# Patient Record
Sex: Male | Born: 1945 | Race: Black or African American | Hispanic: No | Marital: Single | Smoking: Never smoker
Health system: Southern US, Community
[De-identification: ages and names within clinical notes are randomized; demographics above are authoritative.]

## PROBLEM LIST (undated history)

## (undated) DIAGNOSIS — I10 Essential (primary) hypertension: Secondary | ICD-10-CM

## (undated) DIAGNOSIS — N289 Disorder of kidney and ureter, unspecified: Secondary | ICD-10-CM

## (undated) HISTORY — PX: NEPHRECTOMY TRANSPLANTED ORGAN: SUR880

---

## 1998-02-16 ENCOUNTER — Encounter: Payer: Self-pay | Admitting: Emergency Medicine

## 1998-02-16 ENCOUNTER — Emergency Department (HOSPITAL_COMMUNITY): Admission: EM | Admit: 1998-02-16 | Discharge: 1998-02-16 | Payer: Self-pay | Admitting: Emergency Medicine

## 1998-03-21 ENCOUNTER — Ambulatory Visit (HOSPITAL_COMMUNITY): Admission: RE | Admit: 1998-03-21 | Discharge: 1998-03-21 | Payer: Self-pay | Admitting: Family Medicine

## 1998-03-21 ENCOUNTER — Encounter: Payer: Self-pay | Admitting: Family Medicine

## 1998-03-22 ENCOUNTER — Encounter: Payer: Self-pay | Admitting: Nephrology

## 1998-03-22 ENCOUNTER — Inpatient Hospital Stay (HOSPITAL_COMMUNITY): Admission: AD | Admit: 1998-03-22 | Discharge: 1998-03-26 | Payer: Self-pay | Admitting: Nephrology

## 1998-03-23 ENCOUNTER — Encounter: Payer: Self-pay | Admitting: Nephrology

## 1998-03-25 ENCOUNTER — Encounter: Payer: Self-pay | Admitting: Nephrology

## 1998-04-01 ENCOUNTER — Ambulatory Visit (HOSPITAL_COMMUNITY): Admission: RE | Admit: 1998-04-01 | Discharge: 1998-04-01 | Payer: Self-pay | Admitting: Vascular Surgery

## 1998-06-23 ENCOUNTER — Ambulatory Visit (HOSPITAL_COMMUNITY): Admission: RE | Admit: 1998-06-23 | Discharge: 1998-06-23 | Payer: Self-pay | Admitting: Nephrology

## 1998-08-30 ENCOUNTER — Ambulatory Visit (HOSPITAL_COMMUNITY): Admission: RE | Admit: 1998-08-30 | Discharge: 1998-08-30 | Payer: Self-pay | Admitting: Gastroenterology

## 1999-04-10 ENCOUNTER — Ambulatory Visit (HOSPITAL_COMMUNITY): Admission: RE | Admit: 1999-04-10 | Discharge: 1999-04-10 | Payer: Self-pay | Admitting: Nephrology

## 1999-04-10 ENCOUNTER — Encounter: Payer: Self-pay | Admitting: Nephrology

## 2001-02-28 ENCOUNTER — Encounter (HOSPITAL_COMMUNITY): Admission: RE | Admit: 2001-02-28 | Discharge: 2001-05-29 | Payer: Self-pay | Admitting: Nephrology

## 2001-03-19 ENCOUNTER — Encounter: Payer: Self-pay | Admitting: Nephrology

## 2001-03-19 ENCOUNTER — Encounter: Admission: RE | Admit: 2001-03-19 | Discharge: 2001-03-19 | Payer: Self-pay | Admitting: Nephrology

## 2001-05-30 ENCOUNTER — Encounter (HOSPITAL_COMMUNITY): Admission: RE | Admit: 2001-05-30 | Discharge: 2001-08-28 | Payer: Self-pay | Admitting: Nephrology

## 2001-09-04 ENCOUNTER — Encounter (HOSPITAL_COMMUNITY): Admission: RE | Admit: 2001-09-04 | Discharge: 2001-12-03 | Payer: Self-pay | Admitting: Nephrology

## 2002-05-22 ENCOUNTER — Encounter (HOSPITAL_COMMUNITY): Admission: RE | Admit: 2002-05-22 | Discharge: 2002-08-20 | Payer: Self-pay | Admitting: Nephrology

## 2002-08-28 ENCOUNTER — Encounter (HOSPITAL_COMMUNITY): Admission: RE | Admit: 2002-08-28 | Discharge: 2002-11-26 | Payer: Self-pay | Admitting: Nephrology

## 2002-12-04 ENCOUNTER — Encounter (HOSPITAL_COMMUNITY): Admission: RE | Admit: 2002-12-04 | Discharge: 2003-03-04 | Payer: Self-pay | Admitting: Nephrology

## 2003-03-12 ENCOUNTER — Encounter (HOSPITAL_COMMUNITY): Admission: RE | Admit: 2003-03-12 | Discharge: 2003-06-10 | Payer: Self-pay | Admitting: Nephrology

## 2003-06-21 ENCOUNTER — Encounter (HOSPITAL_COMMUNITY): Admission: RE | Admit: 2003-06-21 | Discharge: 2003-09-19 | Payer: Self-pay | Admitting: Nephrology

## 2003-09-24 ENCOUNTER — Encounter (HOSPITAL_COMMUNITY): Admission: RE | Admit: 2003-09-24 | Discharge: 2003-12-23 | Payer: Self-pay | Admitting: Nephrology

## 2003-12-31 ENCOUNTER — Encounter (HOSPITAL_COMMUNITY): Admission: RE | Admit: 2003-12-31 | Discharge: 2004-03-30 | Payer: Self-pay | Admitting: Nephrology

## 2004-04-07 ENCOUNTER — Encounter (HOSPITAL_COMMUNITY): Admission: RE | Admit: 2004-04-07 | Discharge: 2004-07-06 | Payer: Self-pay | Admitting: Nephrology

## 2004-07-07 ENCOUNTER — Encounter (HOSPITAL_COMMUNITY): Admission: RE | Admit: 2004-07-07 | Discharge: 2004-10-05 | Payer: Self-pay | Admitting: Nephrology

## 2004-10-13 ENCOUNTER — Encounter (HOSPITAL_COMMUNITY): Admission: RE | Admit: 2004-10-13 | Discharge: 2005-01-11 | Payer: Self-pay | Admitting: Nephrology

## 2005-01-19 ENCOUNTER — Encounter (HOSPITAL_COMMUNITY): Admission: RE | Admit: 2005-01-19 | Discharge: 2005-04-19 | Payer: Self-pay | Admitting: Nephrology

## 2005-04-20 ENCOUNTER — Encounter (HOSPITAL_COMMUNITY): Admission: RE | Admit: 2005-04-20 | Discharge: 2005-07-19 | Payer: Self-pay | Admitting: Nephrology

## 2005-07-20 ENCOUNTER — Encounter (HOSPITAL_COMMUNITY): Admission: RE | Admit: 2005-07-20 | Discharge: 2005-10-18 | Payer: Self-pay | Admitting: Nephrology

## 2005-11-02 ENCOUNTER — Encounter (HOSPITAL_COMMUNITY): Admission: RE | Admit: 2005-11-02 | Discharge: 2005-11-02 | Payer: Self-pay | Admitting: Nephrology

## 2005-11-23 ENCOUNTER — Encounter (HOSPITAL_COMMUNITY): Admission: RE | Admit: 2005-11-23 | Discharge: 2006-02-21 | Payer: Self-pay | Admitting: Nephrology

## 2006-03-08 ENCOUNTER — Encounter (HOSPITAL_COMMUNITY): Admission: RE | Admit: 2006-03-08 | Discharge: 2006-06-06 | Payer: Self-pay | Admitting: Nephrology

## 2006-03-25 ENCOUNTER — Ambulatory Visit: Payer: Self-pay | Admitting: Family Medicine

## 2006-06-21 ENCOUNTER — Encounter (HOSPITAL_COMMUNITY): Admission: RE | Admit: 2006-06-21 | Discharge: 2006-09-19 | Payer: Self-pay | Admitting: Nephrology

## 2006-10-04 ENCOUNTER — Encounter (HOSPITAL_COMMUNITY): Admission: RE | Admit: 2006-10-04 | Discharge: 2007-01-02 | Payer: Self-pay | Admitting: Nephrology

## 2007-01-10 ENCOUNTER — Encounter (HOSPITAL_COMMUNITY): Admission: RE | Admit: 2007-01-10 | Discharge: 2007-04-10 | Payer: Self-pay | Admitting: Nephrology

## 2007-05-02 ENCOUNTER — Encounter (HOSPITAL_COMMUNITY): Admission: RE | Admit: 2007-05-02 | Discharge: 2007-07-31 | Payer: Self-pay | Admitting: Nephrology

## 2007-08-01 ENCOUNTER — Encounter (HOSPITAL_COMMUNITY): Admission: RE | Admit: 2007-08-01 | Discharge: 2007-10-30 | Payer: Self-pay | Admitting: Nephrology

## 2007-11-14 ENCOUNTER — Encounter (HOSPITAL_COMMUNITY): Admission: RE | Admit: 2007-11-14 | Discharge: 2008-02-12 | Payer: Self-pay | Admitting: Nephrology

## 2008-02-13 ENCOUNTER — Encounter (HOSPITAL_COMMUNITY): Admission: RE | Admit: 2008-02-13 | Discharge: 2008-03-04 | Payer: Self-pay | Admitting: Nephrology

## 2008-03-11 ENCOUNTER — Encounter (HOSPITAL_COMMUNITY): Admission: RE | Admit: 2008-03-11 | Discharge: 2008-06-09 | Payer: Self-pay | Admitting: Nephrology

## 2008-06-11 ENCOUNTER — Encounter (HOSPITAL_COMMUNITY): Admission: RE | Admit: 2008-06-11 | Discharge: 2008-09-09 | Payer: Self-pay | Admitting: Nephrology

## 2008-09-17 ENCOUNTER — Encounter (HOSPITAL_COMMUNITY): Admission: RE | Admit: 2008-09-17 | Discharge: 2008-12-16 | Payer: Self-pay | Admitting: Nephrology

## 2008-12-24 ENCOUNTER — Encounter (HOSPITAL_COMMUNITY): Admission: RE | Admit: 2008-12-24 | Discharge: 2009-03-25 | Payer: Self-pay | Admitting: Nephrology

## 2009-04-15 ENCOUNTER — Encounter (HOSPITAL_COMMUNITY): Admission: RE | Admit: 2009-04-15 | Discharge: 2009-07-14 | Payer: Self-pay | Admitting: Nephrology

## 2009-07-28 ENCOUNTER — Encounter (HOSPITAL_COMMUNITY): Admission: RE | Admit: 2009-07-28 | Discharge: 2009-10-26 | Payer: Self-pay | Admitting: Nephrology

## 2009-11-11 ENCOUNTER — Encounter (HOSPITAL_COMMUNITY)
Admission: RE | Admit: 2009-11-11 | Discharge: 2010-02-09 | Payer: Self-pay | Source: Home / Self Care | Attending: Nephrology | Admitting: Nephrology

## 2010-02-10 ENCOUNTER — Encounter (HOSPITAL_COMMUNITY)
Admission: RE | Admit: 2010-02-10 | Discharge: 2010-04-04 | Payer: Self-pay | Source: Home / Self Care | Attending: Nephrology | Admitting: Nephrology

## 2010-03-20 LAB — POCT HEMOGLOBIN-HEMACUE: Hemoglobin: 11.2 g/dL — ABNORMAL LOW (ref 13.0–17.0)

## 2010-04-07 ENCOUNTER — Other Ambulatory Visit: Payer: Self-pay | Admitting: Nephrology

## 2010-04-07 ENCOUNTER — Encounter (HOSPITAL_COMMUNITY): Payer: BC Managed Care – PPO | Attending: Nephrology

## 2010-04-07 DIAGNOSIS — N183 Chronic kidney disease, stage 3 unspecified: Secondary | ICD-10-CM | POA: Insufficient documentation

## 2010-04-07 DIAGNOSIS — D638 Anemia in other chronic diseases classified elsewhere: Secondary | ICD-10-CM | POA: Insufficient documentation

## 2010-04-10 LAB — POCT HEMOGLOBIN-HEMACUE: Hemoglobin: 10.8 g/dL — ABNORMAL LOW (ref 13.0–17.0)

## 2010-04-28 ENCOUNTER — Encounter (HOSPITAL_COMMUNITY): Payer: Self-pay

## 2010-05-01 ENCOUNTER — Encounter (HOSPITAL_COMMUNITY): Payer: BC Managed Care – PPO

## 2010-05-01 ENCOUNTER — Other Ambulatory Visit: Payer: Self-pay

## 2010-05-02 LAB — POCT HEMOGLOBIN-HEMACUE: Hemoglobin: 10.4 g/dL — ABNORMAL LOW (ref 13.0–17.0)

## 2010-05-15 LAB — POCT HEMOGLOBIN-HEMACUE: Hemoglobin: 11.9 g/dL — ABNORMAL LOW (ref 13.0–17.0)

## 2010-05-16 LAB — TACROLIMUS LEVEL: Tacrolimus (FK506) - LabCorp: 6.5 ng/mL

## 2010-05-16 LAB — CBC
HCT: 30.7 % — ABNORMAL LOW (ref 39.0–52.0)
Hemoglobin: 10.1 g/dL — ABNORMAL LOW (ref 13.0–17.0)
MCH: 28.1 pg (ref 26.0–34.0)
MCHC: 32.9 g/dL (ref 30.0–36.0)
MCV: 85.5 fL (ref 78.0–100.0)
Platelets: 185 10*3/uL (ref 150–400)
RBC: 3.59 MIL/uL — ABNORMAL LOW (ref 4.22–5.81)
RDW: 13.8 % (ref 11.5–15.5)
WBC: 7.9 10*3/uL (ref 4.0–10.5)

## 2010-05-16 LAB — DIFFERENTIAL
Basophils Absolute: 0 10*3/uL (ref 0.0–0.1)
Basophils Relative: 0 % (ref 0–1)
Eosinophils Absolute: 0.1 10*3/uL (ref 0.0–0.7)
Eosinophils Relative: 1 % (ref 0–5)
Lymphocytes Relative: 18 % (ref 12–46)
Lymphs Abs: 1.4 10*3/uL (ref 0.7–4.0)
Monocytes Absolute: 0.2 10*3/uL (ref 0.1–1.0)
Monocytes Relative: 2 % — ABNORMAL LOW (ref 3–12)
Neutro Abs: 6.3 10*3/uL (ref 1.7–7.7)
Neutrophils Relative %: 79 % — ABNORMAL HIGH (ref 43–77)

## 2010-05-16 LAB — PTH, INTACT AND CALCIUM
Calcium, Total (PTH): 8.7 mg/dL (ref 8.4–10.5)
PTH: 110.1 pg/mL — ABNORMAL HIGH (ref 14.0–72.0)

## 2010-05-16 LAB — COMPREHENSIVE METABOLIC PANEL
ALT: 12 U/L (ref 0–53)
AST: 23 U/L (ref 0–37)
Albumin: 3.7 g/dL (ref 3.5–5.2)
Alkaline Phosphatase: 78 U/L (ref 39–117)
BUN: 22 mg/dL (ref 6–23)
CO2: 25 mEq/L (ref 19–32)
Calcium: 9.3 mg/dL (ref 8.4–10.5)
Chloride: 107 mEq/L (ref 96–112)
Creatinine, Ser: 2.46 mg/dL — ABNORMAL HIGH (ref 0.4–1.5)
GFR calc Af Amer: 32 mL/min — ABNORMAL LOW (ref >60)
GFR calc non Af Amer: 27 mL/min — ABNORMAL LOW (ref >60)
Glucose, Bld: 103 mg/dL — ABNORMAL HIGH (ref 70–99)
Potassium: 4.9 mEq/L (ref 3.5–5.1)
Sodium: 136 mEq/L (ref 135–145)
Total Bilirubin: 0.7 mg/dL (ref 0.3–1.2)
Total Protein: 8.2 g/dL (ref 6.0–8.3)

## 2010-05-16 LAB — LIPID PANEL
Cholesterol: 149 mg/dL (ref 0–200)
HDL: 14 mg/dL — ABNORMAL LOW (ref >39)
LDL Cholesterol: 107 mg/dL — ABNORMAL HIGH (ref 0–99)
Total CHOL/HDL Ratio: 10.6 RATIO
Triglycerides: 142 mg/dL (ref <150)
VLDL: 28 mg/dL (ref 0–40)

## 2010-05-17 LAB — IRON AND TIBC
Iron: 31 ug/dL — ABNORMAL LOW (ref 42–135)
Saturation Ratios: 11 % — ABNORMAL LOW (ref 20–55)
TIBC: 270 ug/dL (ref 215–435)
UIBC: 239 ug/dL

## 2010-05-17 LAB — FERRITIN: Ferritin: 335 ng/mL — ABNORMAL HIGH (ref 22–322)

## 2010-05-17 LAB — POCT HEMOGLOBIN-HEMACUE
Hemoglobin: 10.6 g/dL — ABNORMAL LOW (ref 13.0–17.0)
Hemoglobin: 9.8 g/dL — ABNORMAL LOW (ref 13.0–17.0)

## 2010-05-18 LAB — LIPID PANEL
Cholesterol: 166 mg/dL (ref 0–200)
HDL: 13 mg/dL — ABNORMAL LOW (ref >39)
LDL Cholesterol: 126 mg/dL — ABNORMAL HIGH (ref 0–99)
Total CHOL/HDL Ratio: 12.8 RATIO
Triglycerides: 133 mg/dL (ref <150)
VLDL: 27 mg/dL (ref 0–40)

## 2010-05-18 LAB — CBC
HCT: 32.4 % — ABNORMAL LOW (ref 39.0–52.0)
Hemoglobin: 10.9 g/dL — ABNORMAL LOW (ref 13.0–17.0)
MCH: 28.8 pg (ref 26.0–34.0)
MCHC: 33.6 g/dL (ref 30.0–36.0)
MCV: 85.5 fL (ref 78.0–100.0)
Platelets: 177 10*3/uL (ref 150–400)
RBC: 3.79 MIL/uL — ABNORMAL LOW (ref 4.22–5.81)
RDW: 13.2 % (ref 11.5–15.5)
WBC: 6.5 10*3/uL (ref 4.0–10.5)

## 2010-05-18 LAB — COMPREHENSIVE METABOLIC PANEL
ALT: 14 U/L (ref 0–53)
AST: 29 U/L (ref 0–37)
Albumin: 3.5 g/dL (ref 3.5–5.2)
Alkaline Phosphatase: 79 U/L (ref 39–117)
BUN: 24 mg/dL — ABNORMAL HIGH (ref 6–23)
CO2: 21 mEq/L (ref 19–32)
Calcium: 8.9 mg/dL (ref 8.4–10.5)
Chloride: 109 mEq/L (ref 96–112)
Creatinine, Ser: 2.37 mg/dL — ABNORMAL HIGH (ref 0.4–1.5)
GFR calc Af Amer: 34 mL/min — ABNORMAL LOW (ref >60)
GFR calc non Af Amer: 28 mL/min — ABNORMAL LOW (ref >60)
Glucose, Bld: 120 mg/dL — ABNORMAL HIGH (ref 70–99)
Potassium: 5.2 mEq/L — ABNORMAL HIGH (ref 3.5–5.1)
Sodium: 134 mEq/L — ABNORMAL LOW (ref 135–145)
Total Bilirubin: 0.5 mg/dL (ref 0.3–1.2)
Total Protein: 8 g/dL (ref 6.0–8.3)

## 2010-05-18 LAB — POCT HEMOGLOBIN-HEMACUE: Hemoglobin: 10.7 g/dL — ABNORMAL LOW (ref 13.0–17.0)

## 2010-05-18 LAB — DIFFERENTIAL
Basophils Absolute: 0 10*3/uL (ref 0.0–0.1)
Basophils Relative: 0 % (ref 0–1)
Eosinophils Absolute: 0.1 10*3/uL (ref 0.0–0.7)
Eosinophils Relative: 1 % (ref 0–5)
Lymphocytes Relative: 19 % (ref 12–46)
Lymphs Abs: 1.3 10*3/uL (ref 0.7–4.0)
Monocytes Absolute: 0.1 10*3/uL (ref 0.1–1.0)
Monocytes Relative: 2 % — ABNORMAL LOW (ref 3–12)
Neutro Abs: 5.1 10*3/uL (ref 1.7–7.7)
Neutrophils Relative %: 78 % — ABNORMAL HIGH (ref 43–77)

## 2010-05-18 LAB — PTH, INTACT AND CALCIUM
Calcium, Total (PTH): 8.9 mg/dL (ref 8.4–10.5)
PTH: 144.3 pg/mL — ABNORMAL HIGH (ref 14.0–72.0)

## 2010-05-18 LAB — TACROLIMUS LEVEL: Tacrolimus (FK506) - LabCorp: 3.3 ng/mL

## 2010-05-19 ENCOUNTER — Other Ambulatory Visit: Payer: Self-pay | Admitting: Nephrology

## 2010-05-19 ENCOUNTER — Other Ambulatory Visit: Payer: Self-pay

## 2010-05-19 ENCOUNTER — Encounter (HOSPITAL_COMMUNITY)
Admission: RE | Admit: 2010-05-19 | Discharge: 2010-05-19 | Disposition: A | Discharge status: Home / Self Care | Payer: BC Managed Care – PPO | Source: Ambulatory Visit | Attending: Nephrology | Admitting: Nephrology

## 2010-05-19 DIAGNOSIS — D638 Anemia in other chronic diseases classified elsewhere: Secondary | ICD-10-CM | POA: Insufficient documentation

## 2010-05-19 DIAGNOSIS — N183 Chronic kidney disease, stage 3 unspecified: Secondary | ICD-10-CM | POA: Insufficient documentation

## 2010-05-19 LAB — RENAL FUNCTION PANEL
Albumin: 4 g/dL (ref 3.5–5.2)
BUN: 23 mg/dL (ref 6–23)
Calcium: 9.2 mg/dL (ref 8.4–10.5)
Glucose, Bld: 104 mg/dL — ABNORMAL HIGH (ref 70–99)
Phosphorus: 2.4 mg/dL (ref 2.3–4.6)
Potassium: 4.5 mEq/L (ref 3.5–5.1)
Sodium: 137 mEq/L (ref 135–145)

## 2010-05-19 LAB — POCT HEMOGLOBIN-HEMACUE: Hemoglobin: 10 g/dL — ABNORMAL LOW (ref 13.0–17.0)

## 2010-05-19 LAB — FERRITIN: Ferritin: 641 ng/mL — ABNORMAL HIGH (ref 22–322)

## 2010-05-20 LAB — RENAL FUNCTION PANEL
BUN: 26 mg/dL — ABNORMAL HIGH (ref 6–23)
CO2: 22 mEq/L (ref 19–32)
Calcium: 8.5 mg/dL (ref 8.4–10.5)
Chloride: 109 mEq/L (ref 96–112)
Creatinine, Ser: 2.36 mg/dL — ABNORMAL HIGH (ref 0.4–1.5)
Glucose, Bld: 140 mg/dL — ABNORMAL HIGH (ref 70–99)

## 2010-05-20 LAB — IRON AND TIBC
Iron: 50 ug/dL (ref 42–135)
Saturation Ratios: 28 % (ref 20–55)
UIBC: 129 ug/dL

## 2010-05-20 LAB — FERRITIN: Ferritin: 298 ng/mL (ref 22–322)

## 2010-05-21 LAB — POCT HEMOGLOBIN-HEMACUE: Hemoglobin: 9.8 g/dL — ABNORMAL LOW (ref 13.0–17.0)

## 2010-05-22 LAB — PTH, INTACT AND CALCIUM
Calcium, Total (PTH): 9 mg/dL (ref 8.4–10.5)
PTH: 114 pg/mL — ABNORMAL HIGH (ref 14.0–72.0)
PTH: 152.4 pg/mL — ABNORMAL HIGH (ref 14.0–72.0)
PTH: 246.8 pg/mL — ABNORMAL HIGH (ref 14.0–72.0)

## 2010-05-22 LAB — RENAL FUNCTION PANEL
Albumin: 3.5 g/dL (ref 3.5–5.2)
CO2: 23 mEq/L (ref 19–32)
Chloride: 105 mEq/L (ref 96–112)
Chloride: 107 mEq/L (ref 96–112)
Glucose, Bld: 97 mg/dL (ref 70–99)
Glucose, Bld: 105 mg/dL — ABNORMAL HIGH (ref 70–99)
Phosphorus: 2.7 mg/dL (ref 2.3–4.6)
Phosphorus: 3.4 mg/dL (ref 2.3–4.6)
Potassium: 4.6 mEq/L (ref 3.5–5.1)
Potassium: 4.1 mEq/L (ref 3.5–5.1)
Sodium: 135 mEq/L (ref 135–145)
Sodium: 134 mEq/L — ABNORMAL LOW (ref 135–145)

## 2010-05-22 LAB — IRON AND TIBC
Iron: 85 ug/dL (ref 42–135)
TIBC: 212 ug/dL — ABNORMAL LOW (ref 215–435)
TIBC: 200 ug/dL — ABNORMAL LOW (ref 215–435)

## 2010-05-22 LAB — POCT HEMOGLOBIN-HEMACUE
Hemoglobin: 10.4 g/dL — ABNORMAL LOW (ref 13.0–17.0)
Hemoglobin: 10.7 g/dL — ABNORMAL LOW (ref 13.0–17.0)
Hemoglobin: 10.8 g/dL — ABNORMAL LOW (ref 13.0–17.0)
Hemoglobin: 10.9 g/dL — ABNORMAL LOW (ref 13.0–17.0)

## 2010-05-22 LAB — TACROLIMUS LEVEL: Tacrolimus (FK506) - LabCorp: 2.3 ng/mL

## 2010-05-23 LAB — PTH, INTACT AND CALCIUM
Calcium, Total (PTH): 9.3 mg/dL (ref 8.4–10.5)
PTH: 124.8 pg/mL — ABNORMAL HIGH (ref 14.0–72.0)

## 2010-05-23 LAB — LIPID PANEL
Cholesterol: 150 mg/dL (ref 0–200)
LDL Cholesterol: 78 mg/dL (ref 0–99)
Total CHOL/HDL Ratio: 4.5 RATIO
Triglycerides: 195 mg/dL — ABNORMAL HIGH (ref <150)

## 2010-05-23 LAB — RENAL FUNCTION PANEL
Albumin: 3.7 g/dL (ref 3.5–5.2)
BUN: 26 mg/dL — ABNORMAL HIGH (ref 6–23)
Creatinine, Ser: 2.51 mg/dL — ABNORMAL HIGH (ref 0.4–1.5)
GFR calc Af Amer: 32 mL/min — ABNORMAL LOW (ref >60)
GFR calc non Af Amer: 26 mL/min — ABNORMAL LOW (ref >60)
Phosphorus: 2.3 mg/dL (ref 2.3–4.6)
Potassium: 5 mEq/L (ref 3.5–5.1)

## 2010-05-23 LAB — IRON AND TIBC: Saturation Ratios: 34 % (ref 20–55)

## 2010-05-26 LAB — LIPID PANEL
Total CHOL/HDL Ratio: 4.4 RATIO
VLDL: 45 mg/dL — ABNORMAL HIGH (ref 0–40)

## 2010-05-26 LAB — POCT HEMOGLOBIN-HEMACUE: Hemoglobin: 9.8 g/dL — ABNORMAL LOW (ref 13.0–17.0)

## 2010-05-29 LAB — POCT HEMOGLOBIN-HEMACUE: Hemoglobin: 10.3 g/dL — ABNORMAL LOW (ref 13.0–17.0)

## 2010-06-05 LAB — RENAL FUNCTION PANEL
Albumin: 3.6 g/dL (ref 3.5–5.2)
GFR calc Af Amer: 34 mL/min — ABNORMAL LOW (ref >60)
GFR calc non Af Amer: 28 mL/min — ABNORMAL LOW (ref >60)
Phosphorus: 2 mg/dL — ABNORMAL LOW (ref 2.3–4.6)
Potassium: 4.9 mEq/L (ref 3.5–5.1)
Sodium: 133 mEq/L — ABNORMAL LOW (ref 135–145)

## 2010-06-05 LAB — POCT HEMOGLOBIN-HEMACUE
Hemoglobin: 11.9 g/dL — ABNORMAL LOW (ref 13.0–17.0)
Hemoglobin: 12.4 g/dL — ABNORMAL LOW (ref 13.0–17.0)

## 2010-06-05 LAB — PTH, INTACT AND CALCIUM
Calcium, Total (PTH): 9.1 mg/dL (ref 8.4–10.5)
PTH: 135.8 pg/mL — ABNORMAL HIGH (ref 14.0–72.0)

## 2010-06-07 LAB — RENAL FUNCTION PANEL
Calcium: 8.5 mg/dL (ref 8.4–10.5)
GFR calc Af Amer: 34 mL/min — ABNORMAL LOW (ref >60)
Glucose, Bld: 117 mg/dL — ABNORMAL HIGH (ref 70–99)
Phosphorus: 2.3 mg/dL (ref 2.3–4.6)
Sodium: 136 mEq/L (ref 135–145)

## 2010-06-07 LAB — IRON AND TIBC: TIBC: 192 ug/dL — ABNORMAL LOW (ref 215–435)

## 2010-06-07 LAB — FERRITIN: Ferritin: 283 ng/mL (ref 22–322)

## 2010-06-07 LAB — POCT HEMOGLOBIN-HEMACUE: Hemoglobin: 11.5 g/dL — ABNORMAL LOW (ref 13.0–17.0)

## 2010-06-07 LAB — PTH, INTACT AND CALCIUM: PTH: 165.7 pg/mL — ABNORMAL HIGH (ref 14.0–72.0)

## 2010-06-09 ENCOUNTER — Encounter (HOSPITAL_COMMUNITY): Payer: BC Managed Care – PPO | Attending: Nephrology

## 2010-06-09 ENCOUNTER — Other Ambulatory Visit: Payer: Self-pay | Admitting: Nephrology

## 2010-06-09 DIAGNOSIS — D638 Anemia in other chronic diseases classified elsewhere: Secondary | ICD-10-CM | POA: Insufficient documentation

## 2010-06-09 DIAGNOSIS — N183 Chronic kidney disease, stage 3 unspecified: Secondary | ICD-10-CM | POA: Insufficient documentation

## 2010-06-09 LAB — RENAL FUNCTION PANEL
BUN: 37 mg/dL — ABNORMAL HIGH (ref 6–23)
Calcium: 9.3 mg/dL (ref 8.4–10.5)
Calcium: 9.5 mg/dL (ref 8.4–10.5)
Creatinine, Ser: 2.46 mg/dL — ABNORMAL HIGH (ref 0.4–1.5)
Creatinine, Ser: 2.64 mg/dL — ABNORMAL HIGH (ref 0.4–1.5)
GFR calc Af Amer: 33 mL/min — ABNORMAL LOW (ref >60)
GFR calc non Af Amer: 27 mL/min — ABNORMAL LOW (ref >60)
Glucose, Bld: 43 mg/dL — ABNORMAL LOW (ref 70–99)
Phosphorus: 3.4 mg/dL (ref 2.3–4.6)
Phosphorus: 3.5 mg/dL (ref 2.3–4.6)
Sodium: 135 mEq/L (ref 135–145)
Sodium: 138 mEq/L (ref 135–145)

## 2010-06-09 LAB — IRON AND TIBC
Iron: 150 ug/dL — ABNORMAL HIGH (ref 42–135)
Saturation Ratios: 32 % (ref 20–55)
TIBC: 207 ug/dL — ABNORMAL LOW (ref 215–435)
UIBC: 140 ug/dL

## 2010-06-09 LAB — POCT HEMOGLOBIN-HEMACUE: Hemoglobin: 11.6 g/dL — ABNORMAL LOW (ref 13.0–17.0)

## 2010-06-09 LAB — FERRITIN: Ferritin: 358 ng/mL — ABNORMAL HIGH (ref 22–322)

## 2010-06-09 LAB — PTH, INTACT AND CALCIUM
Calcium, Total (PTH): 9.7 mg/dL (ref 8.4–10.5)
PTH: 118.3 pg/mL — ABNORMAL HIGH (ref 14.0–72.0)

## 2010-06-10 LAB — POCT HEMOGLOBIN-HEMACUE: Hemoglobin: 10.9 g/dL — ABNORMAL LOW (ref 13.0–17.0)

## 2010-06-12 LAB — RENAL FUNCTION PANEL
Albumin: 3.3 g/dL — ABNORMAL LOW (ref 3.5–5.2)
BUN: 25 mg/dL — ABNORMAL HIGH (ref 6–23)
CO2: 22 mEq/L (ref 19–32)
Calcium: 8.9 mg/dL (ref 8.4–10.5)
Creatinine, Ser: 2.21 mg/dL — ABNORMAL HIGH (ref 0.4–1.5)
GFR calc Af Amer: 37 mL/min — ABNORMAL LOW (ref >60)
GFR calc non Af Amer: 31 mL/min — ABNORMAL LOW (ref >60)

## 2010-06-12 LAB — IRON AND TIBC
Saturation Ratios: 43 % (ref 20–55)
UIBC: 102 ug/dL

## 2010-06-12 LAB — LIPID PANEL
HDL: <5 mg/dL — ABNORMAL LOW (ref >39)
Triglycerides: 176 mg/dL — ABNORMAL HIGH (ref <150)

## 2010-06-12 LAB — POCT HEMOGLOBIN-HEMACUE
Hemoglobin: 10.1 g/dL — ABNORMAL LOW (ref 13.0–17.0)
Hemoglobin: 11 g/dL — ABNORMAL LOW (ref 13.0–17.0)

## 2010-06-12 LAB — FERRITIN: Ferritin: 368 ng/mL — ABNORMAL HIGH (ref 22–322)

## 2010-06-12 LAB — PTH, INTACT AND CALCIUM: Calcium, Total (PTH): 9.7 mg/dL (ref 8.4–10.5)

## 2010-06-13 LAB — RENAL FUNCTION PANEL
Albumin: 3.4 g/dL — ABNORMAL LOW (ref 3.5–5.2)
BUN: 35 mg/dL — ABNORMAL HIGH (ref 6–23)
Chloride: 113 mEq/L — ABNORMAL HIGH (ref 96–112)
GFR calc non Af Amer: 27 mL/min — ABNORMAL LOW (ref >60)
Phosphorus: 2.7 mg/dL (ref 2.3–4.6)
Potassium: 5.6 mEq/L — ABNORMAL HIGH (ref 3.5–5.1)
Sodium: 136 mEq/L (ref 135–145)

## 2010-06-13 LAB — POCT HEMOGLOBIN-HEMACUE
Hemoglobin: 12.1 g/dL — ABNORMAL LOW (ref 13.0–17.0)
Hemoglobin: 10.1 g/dL — ABNORMAL LOW (ref 13.0–17.0)

## 2010-06-13 LAB — PTH, INTACT AND CALCIUM
Calcium, Total (PTH): 9.1 mg/dL (ref 8.4–10.5)
PTH: 65.4 pg/mL (ref 14.0–72.0)

## 2010-06-13 LAB — IRON AND TIBC: Saturation Ratios: 26 % (ref 20–55)

## 2010-06-14 LAB — POCT HEMOGLOBIN-HEMACUE: Hemoglobin: 10.9 g/dL — ABNORMAL LOW (ref 13.0–17.0)

## 2010-06-15 LAB — HEMOGLOBIN AND HEMATOCRIT, BLOOD
HCT: 35.2 % — ABNORMAL LOW (ref 39.0–52.0)
Hemoglobin: 12 g/dL — ABNORMAL LOW (ref 13.0–17.0)

## 2010-06-15 LAB — RENAL FUNCTION PANEL
Albumin: 3.3 g/dL — ABNORMAL LOW (ref 3.5–5.2)
BUN: 24 mg/dL — ABNORMAL HIGH (ref 6–23)
Chloride: 111 mEq/L (ref 96–112)
Phosphorus: 3.3 mg/dL (ref 2.3–4.6)
Potassium: 5 mEq/L (ref 3.5–5.1)
Sodium: 137 mEq/L (ref 135–145)

## 2010-06-15 LAB — POCT HEMOGLOBIN-HEMACUE: Hemoglobin: 11.2 g/dL — ABNORMAL LOW (ref 13.0–17.0)

## 2010-06-15 LAB — PTH, INTACT AND CALCIUM: Calcium, Total (PTH): 8.8 mg/dL (ref 8.4–10.5)

## 2010-06-15 LAB — IRON AND TIBC
Saturation Ratios: 32 % (ref 20–55)
TIBC: 188 ug/dL — ABNORMAL LOW (ref 215–435)

## 2010-06-19 LAB — HEMOGLOBIN AND HEMATOCRIT, BLOOD
HCT: 33.2 % — ABNORMAL LOW (ref 39.0–52.0)
Hemoglobin: 10.8 g/dL — ABNORMAL LOW (ref 13.0–17.0)

## 2010-06-19 LAB — RENAL FUNCTION PANEL
Albumin: 3.4 g/dL — ABNORMAL LOW (ref 3.5–5.2)
BUN: 24 mg/dL — ABNORMAL HIGH (ref 6–23)
Chloride: 110 mEq/L (ref 96–112)
GFR calc Af Amer: 30 mL/min — ABNORMAL LOW (ref >60)
GFR calc non Af Amer: 24 mL/min — ABNORMAL LOW (ref >60)
Phosphorus: 2.6 mg/dL (ref 2.3–4.6)
Potassium: 4.9 mEq/L (ref 3.5–5.1)
Sodium: 139 mEq/L (ref 135–145)

## 2010-06-19 LAB — IRON AND TIBC
Iron: 53 ug/dL (ref 42–135)
TIBC: 189 ug/dL — ABNORMAL LOW (ref 215–435)

## 2010-06-19 LAB — PTH, INTACT AND CALCIUM
Calcium, Total (PTH): 8.4 mg/dL (ref 8.4–10.5)
PTH: 132.2 pg/mL — ABNORMAL HIGH (ref 14.0–72.0)

## 2010-06-19 LAB — POCT HEMOGLOBIN-HEMACUE: Hemoglobin: 11.8 g/dL — ABNORMAL LOW (ref 13.0–17.0)

## 2010-06-19 LAB — FERRITIN: Ferritin: 344 ng/mL — ABNORMAL HIGH (ref 22–322)

## 2010-06-30 ENCOUNTER — Encounter (HOSPITAL_COMMUNITY): Payer: BC Managed Care – PPO

## 2010-07-03 ENCOUNTER — Encounter (HOSPITAL_COMMUNITY): Payer: BC Managed Care – PPO

## 2010-07-03 ENCOUNTER — Other Ambulatory Visit: Payer: Self-pay | Admitting: Nephrology

## 2010-07-21 ENCOUNTER — Other Ambulatory Visit: Payer: Self-pay | Admitting: Nephrology

## 2010-07-21 ENCOUNTER — Encounter (HOSPITAL_COMMUNITY): Payer: BC Managed Care – PPO | Attending: Nephrology

## 2010-07-21 DIAGNOSIS — N183 Chronic kidney disease, stage 3 unspecified: Secondary | ICD-10-CM | POA: Insufficient documentation

## 2010-07-21 DIAGNOSIS — D638 Anemia in other chronic diseases classified elsewhere: Secondary | ICD-10-CM | POA: Insufficient documentation

## 2010-07-21 LAB — RENAL FUNCTION PANEL
Albumin: 3.8 g/dL (ref 3.5–5.2)
BUN: 33 mg/dL — ABNORMAL HIGH (ref 6–23)
CO2: 20 mEq/L (ref 19–32)
Chloride: 109 mEq/L (ref 96–112)
Creatinine, Ser: 2.59 mg/dL — ABNORMAL HIGH (ref 0.4–1.5)
Glucose, Bld: 129 mg/dL — ABNORMAL HIGH (ref 70–99)
Potassium: 4.9 mEq/L (ref 3.5–5.1)

## 2010-07-21 LAB — IRON AND TIBC
Saturation Ratios: 41 % (ref 20–55)
TIBC: 209 ug/dL — ABNORMAL LOW (ref 215–435)
UIBC: 123 ug/dL

## 2010-07-24 LAB — POCT HEMOGLOBIN-HEMACUE: Hemoglobin: 10.5 g/dL — ABNORMAL LOW (ref 13.0–17.0)

## 2010-07-24 LAB — PTH, INTACT AND CALCIUM: PTH: 165.5 pg/mL — ABNORMAL HIGH (ref 14.0–72.0)

## 2010-08-11 ENCOUNTER — Encounter (HOSPITAL_COMMUNITY): Payer: BC Managed Care – PPO | Attending: Nephrology

## 2010-08-11 ENCOUNTER — Other Ambulatory Visit: Payer: Self-pay | Admitting: Nephrology

## 2010-08-11 DIAGNOSIS — N183 Chronic kidney disease, stage 3 unspecified: Secondary | ICD-10-CM | POA: Insufficient documentation

## 2010-08-11 DIAGNOSIS — D638 Anemia in other chronic diseases classified elsewhere: Secondary | ICD-10-CM | POA: Insufficient documentation

## 2010-09-01 ENCOUNTER — Encounter (HOSPITAL_COMMUNITY): Payer: BC Managed Care – PPO

## 2010-09-01 ENCOUNTER — Other Ambulatory Visit: Payer: Self-pay | Admitting: Nephrology

## 2010-09-01 LAB — POCT HEMOGLOBIN-HEMACUE: Hemoglobin: 8.7 g/dL — ABNORMAL LOW (ref 13.0–17.0)

## 2010-09-15 ENCOUNTER — Other Ambulatory Visit: Payer: Self-pay | Admitting: Nephrology

## 2010-09-15 ENCOUNTER — Encounter (HOSPITAL_COMMUNITY): Payer: BC Managed Care – PPO | Attending: Nephrology

## 2010-09-15 DIAGNOSIS — N183 Chronic kidney disease, stage 3 unspecified: Secondary | ICD-10-CM | POA: Insufficient documentation

## 2010-09-15 DIAGNOSIS — D638 Anemia in other chronic diseases classified elsewhere: Secondary | ICD-10-CM | POA: Insufficient documentation

## 2010-09-15 LAB — RENAL FUNCTION PANEL
Albumin: 3.2 g/dL — ABNORMAL LOW (ref 3.5–5.2)
BUN: 27 mg/dL — ABNORMAL HIGH (ref 6–23)
CO2: 22 mEq/L (ref 19–32)
Chloride: 108 mEq/L (ref 96–112)
Creatinine, Ser: 2.71 mg/dL — ABNORMAL HIGH (ref 0.50–1.35)
GFR calc non Af Amer: 24 mL/min — ABNORMAL LOW (ref >60)
Potassium: 5.3 mEq/L — ABNORMAL HIGH (ref 3.5–5.1)

## 2010-09-15 LAB — IRON AND TIBC
Iron: 48 ug/dL (ref 42–135)
Saturation Ratios: 26 % (ref 20–55)
TIBC: 185 ug/dL — ABNORMAL LOW (ref 215–435)

## 2010-09-15 LAB — POCT HEMOGLOBIN-HEMACUE: Hemoglobin: 10 g/dL — ABNORMAL LOW (ref 13.0–17.0)

## 2010-09-22 ENCOUNTER — Encounter (HOSPITAL_COMMUNITY): Payer: BC Managed Care – PPO

## 2010-09-29 ENCOUNTER — Other Ambulatory Visit: Payer: Self-pay | Admitting: Nephrology

## 2010-09-29 ENCOUNTER — Encounter (HOSPITAL_COMMUNITY): Payer: BC Managed Care – PPO

## 2010-10-13 ENCOUNTER — Encounter (HOSPITAL_COMMUNITY): Payer: BC Managed Care – PPO

## 2010-10-20 ENCOUNTER — Other Ambulatory Visit: Payer: Self-pay | Admitting: Nephrology

## 2010-10-20 ENCOUNTER — Encounter (HOSPITAL_COMMUNITY)
Admission: RE | Admit: 2010-10-20 | Discharge: 2010-10-20 | Disposition: A | Discharge status: Home / Self Care | Payer: BC Managed Care – PPO | Source: Ambulatory Visit | Attending: Nephrology | Admitting: Nephrology

## 2010-10-20 DIAGNOSIS — D638 Anemia in other chronic diseases classified elsewhere: Secondary | ICD-10-CM | POA: Insufficient documentation

## 2010-10-20 DIAGNOSIS — N183 Chronic kidney disease, stage 3 unspecified: Secondary | ICD-10-CM | POA: Insufficient documentation

## 2010-11-10 ENCOUNTER — Other Ambulatory Visit: Payer: Self-pay | Admitting: Nephrology

## 2010-11-10 ENCOUNTER — Encounter (HOSPITAL_COMMUNITY): Payer: BC Managed Care – PPO | Attending: Nephrology

## 2010-11-10 DIAGNOSIS — D638 Anemia in other chronic diseases classified elsewhere: Secondary | ICD-10-CM | POA: Insufficient documentation

## 2010-11-10 DIAGNOSIS — N183 Chronic kidney disease, stage 3 unspecified: Secondary | ICD-10-CM | POA: Insufficient documentation

## 2010-11-10 LAB — RENAL FUNCTION PANEL
BUN: 30 mg/dL — ABNORMAL HIGH (ref 6–23)
CO2: 21 mEq/L (ref 19–32)
Chloride: 109 mEq/L (ref 96–112)
Creatinine, Ser: 2.57 mg/dL — ABNORMAL HIGH (ref 0.50–1.35)
Glucose, Bld: 150 mg/dL — ABNORMAL HIGH (ref 70–99)
Potassium: 4.9 mEq/L (ref 3.5–5.1)

## 2010-11-10 LAB — FERRITIN: Ferritin: 636 ng/mL — ABNORMAL HIGH (ref 22–322)

## 2010-11-10 LAB — IRON AND TIBC
Iron: 39 ug/dL — ABNORMAL LOW (ref 42–135)
Saturation Ratios: 22 % (ref 20–55)
TIBC: 174 ug/dL — ABNORMAL LOW (ref 215–435)

## 2010-11-13 LAB — PTH, INTACT AND CALCIUM: PTH: 36.6 pg/mL (ref 14.0–72.0)

## 2010-11-24 LAB — LIPID PANEL
Cholesterol: 169
HDL: 22 — ABNORMAL LOW
Total CHOL/HDL Ratio: 7.7
VLDL: 31

## 2010-11-24 LAB — PTH, INTACT AND CALCIUM
Calcium, Total (PTH): 9.3
PTH: 96.3 — ABNORMAL HIGH

## 2010-11-24 LAB — IRON AND TIBC
Iron: 55
Saturation Ratios: 26
UIBC: 155

## 2010-11-24 LAB — FERRITIN: Ferritin: 307 (ref 22–322)

## 2010-11-24 LAB — TACROLIMUS, BLOOD: Tacrolimus Lvl: 7.9

## 2010-11-28 LAB — IRON AND TIBC
Iron: 60
UIBC: 139

## 2010-11-28 LAB — LIPID PANEL
Cholesterol: 148
LDL Cholesterol: 91

## 2010-11-28 LAB — RENAL FUNCTION PANEL
BUN: 33 — ABNORMAL HIGH
CO2: 22
Chloride: 108
Glucose, Bld: 89
Phosphorus: 2.5
Potassium: 4.9
Sodium: 137

## 2010-11-28 LAB — HEMOGLOBIN AND HEMATOCRIT, BLOOD
HCT: 28.2 — ABNORMAL LOW
Hemoglobin: 9.7 — ABNORMAL LOW

## 2010-11-28 LAB — TACROLIMUS, BLOOD: Tacrolimus Lvl: 5.2

## 2010-11-28 LAB — FERRITIN: Ferritin: 343 — ABNORMAL HIGH (ref 22–322)

## 2010-11-30 LAB — CBC
HCT: 32.2 — ABNORMAL LOW
MCV: 89.5
Platelets: 217
RBC: 3.6 — ABNORMAL LOW
WBC: 7

## 2010-11-30 LAB — IRON AND TIBC
Saturation Ratios: 22
UIBC: 158

## 2010-11-30 LAB — FERRITIN: Ferritin: 376 — ABNORMAL HIGH (ref 22–322)

## 2010-12-01 ENCOUNTER — Other Ambulatory Visit: Payer: Self-pay | Admitting: Nephrology

## 2010-12-01 ENCOUNTER — Encounter (HOSPITAL_COMMUNITY)
Admission: RE | Admit: 2010-12-01 | Discharge: 2010-12-01 | Payer: BC Managed Care – PPO | Source: Ambulatory Visit | Attending: Nephrology | Admitting: Nephrology

## 2010-12-01 LAB — HEMOGLOBIN AND HEMATOCRIT, BLOOD: HCT: 30.5 — ABNORMAL LOW

## 2010-12-01 LAB — POCT HEMOGLOBIN-HEMACUE: Hemoglobin: 10.7 g/dL — ABNORMAL LOW (ref 13.0–17.0)

## 2010-12-04 LAB — TACROLIMUS LEVEL: Tacrolimus (FK506) - LabCorp: 3.9 ng/mL

## 2010-12-05 LAB — RENAL FUNCTION PANEL
BUN: 24 — ABNORMAL HIGH
Chloride: 107
Creatinine, Ser: 2.36 — ABNORMAL HIGH
Glucose, Bld: 90
Potassium: 3.9

## 2010-12-05 LAB — IRON AND TIBC
Iron: 126
Saturation Ratios: 63 — ABNORMAL HIGH
UIBC: 73

## 2010-12-05 LAB — POCT HEMOGLOBIN-HEMACUE: Hemoglobin: 9.4 — ABNORMAL LOW

## 2010-12-05 LAB — MISCELLANEOUS TEST

## 2010-12-05 LAB — PTH, INTACT AND CALCIUM: PTH: 104.4 — ABNORMAL HIGH

## 2010-12-05 LAB — FERRITIN: Ferritin: 407 — ABNORMAL HIGH (ref 22–322)

## 2010-12-06 LAB — POCT HEMOGLOBIN-HEMACUE: Hemoglobin: 11 — ABNORMAL LOW

## 2010-12-08 LAB — PTH, INTACT AND CALCIUM: PTH: 48.4

## 2010-12-08 LAB — RENAL FUNCTION PANEL
BUN: 37 — ABNORMAL HIGH
CO2: 21
Chloride: 106
Glucose, Bld: 104 — ABNORMAL HIGH
Phosphorus: 3.9
Potassium: 4.7

## 2010-12-08 LAB — LIPID PANEL
HDL: 18 — ABNORMAL LOW
LDL Cholesterol: 92 mg/dL (ref 0–99)
LDL Cholesterol: 111 — ABNORMAL HIGH
Triglycerides: 137 mg/dL (ref <150)
Triglycerides: 112

## 2010-12-08 LAB — IRON AND TIBC: Iron: 88

## 2010-12-08 LAB — TACROLIMUS LEVEL: Tacrolimus (FK506) - LabCorp: 3.5 ng/mL

## 2010-12-08 LAB — FERRITIN: Ferritin: 363 — ABNORMAL HIGH (ref 22–322)

## 2010-12-08 LAB — CBC
MCHC: 34.4
MCV: 86.6
RBC: 3.8 — ABNORMAL LOW

## 2010-12-08 LAB — POCT HEMOGLOBIN-HEMACUE: Hemoglobin: 9.6 g/dL — ABNORMAL LOW (ref 13.0–17.0)

## 2010-12-12 LAB — CBC
HCT: 34.5 — ABNORMAL LOW
Hemoglobin: 11.8 — ABNORMAL LOW
MCV: 88.1
RBC: 3.92 — ABNORMAL LOW
WBC: 5.8

## 2010-12-13 LAB — RENAL FUNCTION PANEL
BUN: 24 — ABNORMAL HIGH
CO2: 24
Calcium: 9
Glucose, Bld: 93
Phosphorus: 2.8
Potassium: 4

## 2010-12-13 LAB — PTH, INTACT AND CALCIUM: PTH: 77.6 — ABNORMAL HIGH

## 2010-12-13 LAB — CBC
HCT: 33.8 — ABNORMAL LOW
MCV: 88
Platelets: 200
RDW: 14.1 — ABNORMAL HIGH

## 2010-12-13 LAB — LIPID PANEL
Cholesterol: 149
LDL Cholesterol: 61
Total CHOL/HDL Ratio: 6.5
Triglycerides: 326 — ABNORMAL HIGH
VLDL: 65 — ABNORMAL HIGH

## 2010-12-14 LAB — CBC
Hemoglobin: 11.7 — ABNORMAL LOW
MCHC: 33.9
RBC: 3.95 — ABNORMAL LOW
WBC: 5.8

## 2010-12-15 LAB — LIPID PANEL
Cholesterol: 163
HDL: 15 — ABNORMAL LOW
LDL Cholesterol: 109 — ABNORMAL HIGH
Total CHOL/HDL Ratio: 10.9
Triglycerides: 196 — ABNORMAL HIGH

## 2010-12-15 LAB — RENAL FUNCTION PANEL
CO2: 23
Calcium: 9.2
Chloride: 107
GFR calc Af Amer: 25 — ABNORMAL LOW
GFR calc non Af Amer: 21 — ABNORMAL LOW
Potassium: 4.5
Sodium: 137

## 2010-12-15 LAB — IRON AND TIBC
Iron: 34 — ABNORMAL LOW
UIBC: 183

## 2010-12-15 LAB — CBC
HCT: 34.7 — ABNORMAL LOW
MCHC: 33.9
MCV: 88
RBC: 3.94 — ABNORMAL LOW
WBC: 7.2

## 2010-12-15 LAB — TACROLIMUS, BLOOD: Tacrolimus Lvl: 6.8

## 2010-12-18 LAB — IRON AND TIBC
Saturation Ratios: 38
TIBC: 254

## 2010-12-18 LAB — CBC
Hemoglobin: 11.2 — ABNORMAL LOW
RBC: 3.84 — ABNORMAL LOW
RDW: 14.2 — ABNORMAL HIGH

## 2010-12-19 LAB — CBC
MCV: 88
Platelets: 181
WBC: 6.4

## 2010-12-19 LAB — RENAL FUNCTION PANEL
CO2: 22
Calcium: 9
Chloride: 110
Glucose, Bld: 107 — ABNORMAL HIGH
Potassium: 4.2
Sodium: 136

## 2010-12-19 LAB — IRON AND TIBC
Iron: 97
UIBC: 112

## 2010-12-22 ENCOUNTER — Encounter (HOSPITAL_COMMUNITY): Payer: BC Managed Care – PPO | Attending: Nephrology

## 2010-12-22 ENCOUNTER — Other Ambulatory Visit: Payer: Self-pay | Admitting: Nephrology

## 2010-12-22 DIAGNOSIS — D638 Anemia in other chronic diseases classified elsewhere: Secondary | ICD-10-CM | POA: Insufficient documentation

## 2010-12-22 DIAGNOSIS — N183 Chronic kidney disease, stage 3 unspecified: Secondary | ICD-10-CM | POA: Insufficient documentation

## 2010-12-22 LAB — POCT HEMOGLOBIN-HEMACUE: Hemoglobin: 10.6 g/dL — ABNORMAL LOW (ref 13.0–17.0)

## 2011-01-12 ENCOUNTER — Encounter (HOSPITAL_COMMUNITY)
Admission: RE | Admit: 2011-01-12 | Discharge: 2011-01-12 | Disposition: A | Discharge status: Home / Self Care | Payer: BC Managed Care – PPO | Source: Ambulatory Visit | Attending: Nephrology | Admitting: Nephrology

## 2011-01-12 DIAGNOSIS — D638 Anemia in other chronic diseases classified elsewhere: Secondary | ICD-10-CM | POA: Insufficient documentation

## 2011-01-12 DIAGNOSIS — N183 Chronic kidney disease, stage 3 unspecified: Secondary | ICD-10-CM | POA: Insufficient documentation

## 2011-01-12 LAB — RENAL FUNCTION PANEL
CO2: 20 mEq/L (ref 19–32)
Chloride: 105 mEq/L (ref 96–112)
GFR calc Af Amer: 32 mL/min — ABNORMAL LOW (ref >90)
GFR calc non Af Amer: 28 mL/min — ABNORMAL LOW (ref >90)
Potassium: 4.3 mEq/L (ref 3.5–5.1)
Sodium: 135 mEq/L (ref 135–145)

## 2011-01-12 LAB — IRON AND TIBC
Iron: 47 ug/dL (ref 42–135)
Saturation Ratios: 24 % (ref 20–55)
TIBC: 195 ug/dL — ABNORMAL LOW (ref 215–435)

## 2011-01-12 MED ORDER — EPOETIN ALFA 10000 UNIT/ML IJ SOLN: 20000.0000 [IU] | INTRAMUSCULAR | Status: DC

## 2011-01-12 MED ORDER — EPOETIN ALFA 20000 UNIT/ML IJ SOLN
INTRAMUSCULAR | Status: AC
  Filled 2011-01-12: qty 1

## 2011-01-15 MED FILL — Epoetin Alfa Inj 20000 Unit/ML: INTRAMUSCULAR | Qty: 1 | Status: AC

## 2011-02-02 ENCOUNTER — Encounter (HOSPITAL_COMMUNITY)
Admission: RE | Admit: 2011-02-02 | Discharge: 2011-02-02 | Disposition: A | Discharge status: Home / Self Care | Payer: BC Managed Care – PPO | Source: Ambulatory Visit | Attending: Nephrology | Admitting: Nephrology

## 2011-02-02 MED ORDER — EPOETIN ALFA 10000 UNIT/ML IJ SOLN: 20000.0000 [IU] | INTRAMUSCULAR | Status: DC

## 2011-02-02 MED ORDER — EPOETIN ALFA 20000 UNIT/ML IJ SOLN
INTRAMUSCULAR | Status: AC
  Administered 2011-02-02: 20000 [IU] via SUBCUTANEOUS
  Filled 2011-02-02: qty 1

## 2011-02-05 ENCOUNTER — Encounter (HOSPITAL_COMMUNITY): Payer: BC Managed Care – PPO

## 2011-02-06 ENCOUNTER — Encounter (HOSPITAL_COMMUNITY): Payer: BC Managed Care – PPO

## 2011-02-07 ENCOUNTER — Encounter (HOSPITAL_COMMUNITY): Payer: BC Managed Care – PPO

## 2011-02-08 ENCOUNTER — Encounter (HOSPITAL_COMMUNITY): Payer: BC Managed Care – PPO

## 2011-02-09 ENCOUNTER — Encounter (HOSPITAL_COMMUNITY): Payer: BC Managed Care – PPO

## 2011-02-12 ENCOUNTER — Encounter (HOSPITAL_COMMUNITY): Payer: BC Managed Care – PPO

## 2011-02-13 ENCOUNTER — Encounter (HOSPITAL_COMMUNITY): Payer: BC Managed Care – PPO

## 2011-02-14 ENCOUNTER — Encounter (HOSPITAL_COMMUNITY): Payer: BC Managed Care – PPO

## 2011-02-15 ENCOUNTER — Encounter (HOSPITAL_COMMUNITY): Payer: BC Managed Care – PPO

## 2011-02-16 ENCOUNTER — Encounter (HOSPITAL_COMMUNITY): Payer: BC Managed Care – PPO

## 2011-02-19 ENCOUNTER — Encounter (HOSPITAL_COMMUNITY): Payer: BC Managed Care – PPO

## 2011-02-20 ENCOUNTER — Encounter (HOSPITAL_COMMUNITY): Payer: BC Managed Care – PPO

## 2011-02-21 ENCOUNTER — Encounter (HOSPITAL_COMMUNITY): Payer: BC Managed Care – PPO

## 2011-02-22 ENCOUNTER — Encounter (HOSPITAL_COMMUNITY): Payer: BC Managed Care – PPO

## 2011-02-23 ENCOUNTER — Encounter (HOSPITAL_COMMUNITY): Payer: BC Managed Care – PPO

## 2011-02-23 ENCOUNTER — Encounter (HOSPITAL_COMMUNITY)
Admission: RE | Admit: 2011-02-23 | Discharge: 2011-02-23 | Disposition: A | Discharge status: Home / Self Care | Payer: BC Managed Care – PPO | Source: Ambulatory Visit | Attending: Nephrology | Admitting: Nephrology

## 2011-02-23 DIAGNOSIS — N183 Chronic kidney disease, stage 3 unspecified: Secondary | ICD-10-CM | POA: Insufficient documentation

## 2011-02-23 DIAGNOSIS — D638 Anemia in other chronic diseases classified elsewhere: Secondary | ICD-10-CM | POA: Insufficient documentation

## 2011-02-23 MED ORDER — EPOETIN ALFA 10000 UNIT/ML IJ SOLN: 20000.0000 [IU] | INTRAMUSCULAR | Status: DC

## 2011-02-23 MED ORDER — EPOETIN ALFA 20000 UNIT/ML IJ SOLN
INTRAMUSCULAR | Status: AC
  Administered 2011-02-23: 20000 [IU] via SUBCUTANEOUS
  Filled 2011-02-23: qty 1

## 2011-02-26 ENCOUNTER — Encounter (HOSPITAL_COMMUNITY): Payer: BC Managed Care – PPO

## 2011-02-28 ENCOUNTER — Encounter (HOSPITAL_COMMUNITY): Payer: BC Managed Care – PPO

## 2011-03-01 ENCOUNTER — Encounter (HOSPITAL_COMMUNITY): Payer: BC Managed Care – PPO

## 2011-03-02 ENCOUNTER — Encounter (HOSPITAL_COMMUNITY): Payer: BC Managed Care – PPO

## 2011-03-05 ENCOUNTER — Encounter (HOSPITAL_COMMUNITY): Payer: BC Managed Care – PPO

## 2011-03-07 ENCOUNTER — Encounter (HOSPITAL_COMMUNITY): Payer: BC Managed Care – PPO

## 2011-03-08 ENCOUNTER — Encounter (HOSPITAL_COMMUNITY): Payer: BC Managed Care – PPO

## 2011-03-09 ENCOUNTER — Encounter (HOSPITAL_COMMUNITY): Payer: BC Managed Care – PPO

## 2011-03-12 ENCOUNTER — Encounter (HOSPITAL_COMMUNITY): Payer: BC Managed Care – PPO

## 2011-03-13 ENCOUNTER — Encounter (HOSPITAL_COMMUNITY): Payer: BC Managed Care – PPO

## 2011-03-14 ENCOUNTER — Encounter (HOSPITAL_COMMUNITY): Payer: BC Managed Care – PPO

## 2011-03-15 ENCOUNTER — Encounter (HOSPITAL_COMMUNITY): Payer: BC Managed Care – PPO

## 2011-03-16 ENCOUNTER — Encounter (HOSPITAL_COMMUNITY): Payer: BC Managed Care – PPO

## 2011-03-16 ENCOUNTER — Encounter (HOSPITAL_COMMUNITY)
Admission: RE | Admit: 2011-03-16 | Discharge: 2011-03-16 | Disposition: A | Discharge status: Home / Self Care | Payer: BC Managed Care – PPO | Source: Ambulatory Visit | Attending: Nephrology | Admitting: Nephrology

## 2011-03-16 DIAGNOSIS — D638 Anemia in other chronic diseases classified elsewhere: Secondary | ICD-10-CM | POA: Insufficient documentation

## 2011-03-16 DIAGNOSIS — N183 Chronic kidney disease, stage 3 unspecified: Secondary | ICD-10-CM | POA: Insufficient documentation

## 2011-03-16 LAB — RENAL FUNCTION PANEL
Albumin: 3.5 g/dL (ref 3.5–5.2)
GFR calc Af Amer: 32 mL/min — ABNORMAL LOW (ref >90)
GFR calc non Af Amer: 28 mL/min — ABNORMAL LOW (ref >90)
Glucose, Bld: 65 mg/dL — ABNORMAL LOW (ref 70–99)
Phosphorus: 2.2 mg/dL — ABNORMAL LOW (ref 2.3–4.6)
Potassium: 5.6 mEq/L — ABNORMAL HIGH (ref 3.5–5.1)
Sodium: 134 mEq/L — ABNORMAL LOW (ref 135–145)

## 2011-03-16 LAB — IRON AND TIBC: Iron: 43 ug/dL (ref 42–135)

## 2011-03-16 LAB — POCT HEMOGLOBIN-HEMACUE: Hemoglobin: 10.4 g/dL — ABNORMAL LOW (ref 13.0–17.0)

## 2011-03-16 MED ORDER — EPOETIN ALFA 20000 UNIT/ML IJ SOLN
INTRAMUSCULAR | Status: AC
  Administered 2011-03-16: 17:00:00 via SUBCUTANEOUS
  Filled 2011-03-16: qty 1

## 2011-03-16 MED ORDER — EPOETIN ALFA 10000 UNIT/ML IJ SOLN: 20000.0000 [IU] | INTRAMUSCULAR | Status: DC

## 2011-03-19 ENCOUNTER — Encounter (HOSPITAL_COMMUNITY): Payer: BC Managed Care – PPO

## 2011-03-20 ENCOUNTER — Encounter (HOSPITAL_COMMUNITY): Payer: BC Managed Care – PPO

## 2011-03-21 ENCOUNTER — Encounter (HOSPITAL_COMMUNITY): Payer: BC Managed Care – PPO

## 2011-03-22 ENCOUNTER — Encounter (HOSPITAL_COMMUNITY): Payer: BC Managed Care – PPO

## 2011-03-23 ENCOUNTER — Encounter (HOSPITAL_COMMUNITY): Payer: BC Managed Care – PPO

## 2011-03-26 ENCOUNTER — Encounter (HOSPITAL_COMMUNITY): Payer: BC Managed Care – PPO

## 2011-03-27 ENCOUNTER — Encounter (HOSPITAL_COMMUNITY): Payer: BC Managed Care – PPO

## 2011-03-28 ENCOUNTER — Encounter (HOSPITAL_COMMUNITY): Payer: BC Managed Care – PPO

## 2011-03-29 ENCOUNTER — Encounter (HOSPITAL_COMMUNITY): Payer: BC Managed Care – PPO

## 2011-03-30 ENCOUNTER — Encounter (HOSPITAL_COMMUNITY): Payer: BC Managed Care – PPO

## 2011-04-02 ENCOUNTER — Encounter (HOSPITAL_COMMUNITY): Payer: BC Managed Care – PPO

## 2011-04-03 ENCOUNTER — Encounter (HOSPITAL_COMMUNITY): Payer: BC Managed Care – PPO

## 2011-04-04 ENCOUNTER — Encounter (HOSPITAL_COMMUNITY): Payer: BC Managed Care – PPO

## 2011-04-05 ENCOUNTER — Encounter (HOSPITAL_COMMUNITY): Payer: BC Managed Care – PPO

## 2011-04-06 ENCOUNTER — Encounter (HOSPITAL_COMMUNITY)
Admission: RE | Admit: 2011-04-06 | Discharge: 2011-04-06 | Disposition: A | Discharge status: Home / Self Care | Payer: BC Managed Care – PPO | Source: Ambulatory Visit | Attending: Nephrology | Admitting: Nephrology

## 2011-04-06 DIAGNOSIS — D638 Anemia in other chronic diseases classified elsewhere: Secondary | ICD-10-CM | POA: Insufficient documentation

## 2011-04-06 DIAGNOSIS — N183 Chronic kidney disease, stage 3 unspecified: Secondary | ICD-10-CM | POA: Insufficient documentation

## 2011-04-06 LAB — POCT HEMOGLOBIN-HEMACUE: Hemoglobin: 10.8 g/dL — ABNORMAL LOW (ref 13.0–17.0)

## 2011-04-06 MED ORDER — EPOETIN ALFA 10000 UNIT/ML IJ SOLN
20000.0000 [IU] | INTRAMUSCULAR | Status: DC
  Administered 2011-04-06: 20000 [IU] via SUBCUTANEOUS

## 2011-04-06 MED ORDER — EPOETIN ALFA 20000 UNIT/ML IJ SOLN
INTRAMUSCULAR | Status: AC
  Filled 2011-04-06: qty 1

## 2011-04-25 ENCOUNTER — Other Ambulatory Visit (HOSPITAL_COMMUNITY): Payer: Self-pay | Admitting: *Deleted

## 2011-04-27 ENCOUNTER — Encounter (HOSPITAL_COMMUNITY)
Admission: RE | Admit: 2011-04-27 | Discharge: 2011-04-27 | Disposition: A | Discharge status: Home / Self Care | Payer: BC Managed Care – PPO | Source: Ambulatory Visit | Attending: Nephrology | Admitting: Nephrology

## 2011-04-27 ENCOUNTER — Encounter (HOSPITAL_COMMUNITY): Payer: BC Managed Care – PPO

## 2011-04-27 LAB — POCT HEMOGLOBIN-HEMACUE: Hemoglobin: 10 g/dL — ABNORMAL LOW (ref 13.0–17.0)

## 2011-04-27 MED ORDER — FERUMOXYTOL INJECTION 510 MG/17 ML
INTRAVENOUS | Status: AC
  Administered 2011-04-27: 510 mg via INTRAVENOUS
  Filled 2011-04-27: qty 17

## 2011-04-27 MED ORDER — FERUMOXYTOL INJECTION 510 MG/17 ML
510.0000 mg | INTRAVENOUS | Status: DC
  Administered 2011-04-27: 510 mg via INTRAVENOUS

## 2011-04-27 MED ORDER — EPOETIN ALFA 20000 UNIT/ML IJ SOLN
INTRAMUSCULAR | Status: AC
  Administered 2011-04-27: 20000 [IU] via SUBCUTANEOUS
  Filled 2011-04-27: qty 1

## 2011-04-27 MED ORDER — EPOETIN ALFA 10000 UNIT/ML IJ SOLN: 20000.0000 [IU] | INTRAMUSCULAR | Status: DC

## 2011-05-18 ENCOUNTER — Encounter (HOSPITAL_COMMUNITY)
Admission: RE | Admit: 2011-05-18 | Discharge: 2011-05-18 | Disposition: A | Discharge status: Home / Self Care | Payer: BC Managed Care – PPO | Source: Ambulatory Visit | Attending: Nephrology | Admitting: Nephrology

## 2011-05-18 DIAGNOSIS — D638 Anemia in other chronic diseases classified elsewhere: Secondary | ICD-10-CM | POA: Insufficient documentation

## 2011-05-18 DIAGNOSIS — N183 Chronic kidney disease, stage 3 unspecified: Secondary | ICD-10-CM | POA: Insufficient documentation

## 2011-05-18 MED ORDER — FERUMOXYTOL INJECTION 510 MG/17 ML
INTRAVENOUS | Status: AC
  Administered 2011-05-18: 510 mg via INTRAVENOUS
  Filled 2011-05-18: qty 17

## 2011-05-18 MED ORDER — SODIUM CHLORIDE 0.9 % IV SOLN
Freq: Once | INTRAVENOUS | Status: AC
  Administered 2011-05-18: 13:00:00 via INTRAVENOUS

## 2011-05-18 MED ORDER — FERUMOXYTOL INJECTION 510 MG/17 ML
510.0000 mg | INTRAVENOUS | Status: AC
  Administered 2011-05-18: 510 mg via INTRAVENOUS

## 2011-05-18 MED ORDER — EPOETIN ALFA 10000 UNIT/ML IJ SOLN
20000.0000 [IU] | INTRAMUSCULAR | Status: DC
Start: 2011-05-18 — End: 2011-05-19

## 2011-05-18 MED ORDER — EPOETIN ALFA 20000 UNIT/ML IJ SOLN
INTRAMUSCULAR | Status: AC
  Administered 2011-05-18: 20000 [IU] via SUBCUTANEOUS
  Filled 2011-05-18: qty 1

## 2011-06-05 ENCOUNTER — Other Ambulatory Visit (HOSPITAL_COMMUNITY): Payer: Self-pay | Admitting: *Deleted

## 2011-06-08 ENCOUNTER — Encounter (HOSPITAL_COMMUNITY)
Admission: RE | Admit: 2011-06-08 | Discharge: 2011-06-08 | Disposition: A | Discharge status: Home / Self Care | Payer: BC Managed Care – PPO | Source: Ambulatory Visit | Attending: Nephrology | Admitting: Nephrology

## 2011-06-08 DIAGNOSIS — N183 Chronic kidney disease, stage 3 unspecified: Secondary | ICD-10-CM | POA: Insufficient documentation

## 2011-06-08 DIAGNOSIS — D638 Anemia in other chronic diseases classified elsewhere: Secondary | ICD-10-CM | POA: Insufficient documentation

## 2011-06-08 LAB — RENAL FUNCTION PANEL
BUN: 25 mg/dL — ABNORMAL HIGH (ref 6–23)
CO2: 23 mEq/L (ref 19–32)
Calcium: 9.2 mg/dL (ref 8.4–10.5)
Chloride: 108 mEq/L (ref 96–112)
Creatinine, Ser: 2.26 mg/dL — ABNORMAL HIGH (ref 0.50–1.35)

## 2011-06-08 LAB — IRON AND TIBC
Saturation Ratios: 82 % — ABNORMAL HIGH (ref 20–55)
TIBC: 176 ug/dL — ABNORMAL LOW (ref 215–435)
UIBC: 32 ug/dL — ABNORMAL LOW (ref 125–400)

## 2011-06-08 LAB — FERRITIN: Ferritin: 1116 ng/mL — ABNORMAL HIGH (ref 22–322)

## 2011-06-08 MED ORDER — EPOETIN ALFA 10000 UNIT/ML IJ SOLN: 20000.0000 [IU] | INTRAMUSCULAR | Status: DC

## 2011-06-08 MED ORDER — EPOETIN ALFA 20000 UNIT/ML IJ SOLN
INTRAMUSCULAR | Status: AC
  Administered 2011-06-08: 20000 [IU]
  Filled 2011-06-08: qty 1

## 2011-06-11 LAB — PTH, INTACT AND CALCIUM: PTH: 84 pg/mL — ABNORMAL HIGH (ref 14.0–72.0)

## 2011-06-11 LAB — POCT HEMOGLOBIN-HEMACUE: Hemoglobin: 10.9 g/dL — ABNORMAL LOW (ref 13.0–17.0)

## 2011-06-29 ENCOUNTER — Encounter (HOSPITAL_COMMUNITY)
Admission: RE | Admit: 2011-06-29 | Discharge: 2011-06-29 | Disposition: A | Discharge status: Home / Self Care | Payer: BC Managed Care – PPO | Source: Ambulatory Visit | Attending: Nephrology | Admitting: Nephrology

## 2011-06-29 LAB — POCT HEMOGLOBIN-HEMACUE: Hemoglobin: 10.2 g/dL — ABNORMAL LOW (ref 13.0–17.0)

## 2011-06-29 MED ORDER — EPOETIN ALFA 10000 UNIT/ML IJ SOLN: 20000.0000 [IU] | INTRAMUSCULAR | Status: DC

## 2011-06-29 MED ORDER — EPOETIN ALFA 20000 UNIT/ML IJ SOLN
INTRAMUSCULAR | Status: AC
  Administered 2011-06-29: 20000 [IU] via SUBCUTANEOUS
  Filled 2011-06-29: qty 1

## 2011-07-16 ENCOUNTER — Other Ambulatory Visit (HOSPITAL_COMMUNITY): Payer: Self-pay | Admitting: *Deleted

## 2011-07-20 ENCOUNTER — Encounter (HOSPITAL_COMMUNITY)
Admission: RE | Admit: 2011-07-20 | Discharge: 2011-07-20 | Disposition: A | Discharge status: Home / Self Care | Payer: BC Managed Care – PPO | Source: Ambulatory Visit | Attending: Nephrology | Admitting: Nephrology

## 2011-07-20 DIAGNOSIS — N183 Chronic kidney disease, stage 3 unspecified: Secondary | ICD-10-CM | POA: Insufficient documentation

## 2011-07-20 DIAGNOSIS — D638 Anemia in other chronic diseases classified elsewhere: Secondary | ICD-10-CM | POA: Insufficient documentation

## 2011-07-20 LAB — POCT HEMOGLOBIN-HEMACUE: Hemoglobin: 11.3 g/dL — ABNORMAL LOW (ref 13.0–17.0)

## 2011-07-20 MED ORDER — EPOETIN ALFA 20000 UNIT/ML IJ SOLN
INTRAMUSCULAR | Status: AC
  Administered 2011-07-20: 20000 [IU] via SUBCUTANEOUS
  Filled 2011-07-20: qty 1

## 2011-07-20 MED ORDER — EPOETIN ALFA 10000 UNIT/ML IJ SOLN: 20000.0000 [IU] | INTRAMUSCULAR | Status: DC

## 2011-08-09 ENCOUNTER — Other Ambulatory Visit (HOSPITAL_COMMUNITY): Payer: Self-pay | Admitting: *Deleted

## 2011-08-10 ENCOUNTER — Encounter (HOSPITAL_COMMUNITY)
Admission: RE | Admit: 2011-08-10 | Discharge: 2011-08-10 | Disposition: A | Discharge status: Home / Self Care | Payer: BC Managed Care – PPO | Source: Ambulatory Visit | Attending: Nephrology | Admitting: Nephrology

## 2011-08-10 DIAGNOSIS — N183 Chronic kidney disease, stage 3 unspecified: Secondary | ICD-10-CM | POA: Insufficient documentation

## 2011-08-10 DIAGNOSIS — D638 Anemia in other chronic diseases classified elsewhere: Secondary | ICD-10-CM | POA: Insufficient documentation

## 2011-08-10 LAB — URINALYSIS, MICROSCOPIC ONLY
Glucose, UA: NEGATIVE mg/dL
Ketones, ur: NEGATIVE mg/dL
Nitrite: NEGATIVE
Specific Gravity, Urine: 1.014 (ref 1.005–1.030)
pH: 5.5 (ref 5.0–8.0)

## 2011-08-10 LAB — CBC
Hemoglobin: 11.8 g/dL — ABNORMAL LOW (ref 13.0–17.0)
MCH: 29 pg (ref 26.0–34.0)
MCHC: 34.2 g/dL (ref 30.0–36.0)
Platelets: 191 10*3/uL (ref 150–400)
RDW: 13 % (ref 11.5–15.5)

## 2011-08-10 LAB — DIFFERENTIAL
Basophils Absolute: 0 10*3/uL (ref 0.0–0.1)
Eosinophils Absolute: 0.3 10*3/uL (ref 0.0–0.7)
Eosinophils Relative: 4 % (ref 0–5)
Lymphs Abs: 2.5 10*3/uL (ref 0.7–4.0)
Neutrophils Relative %: 54 % (ref 43–77)

## 2011-08-10 LAB — COMPREHENSIVE METABOLIC PANEL
ALT: 8 U/L (ref 0–53)
AST: 15 U/L (ref 0–37)
Albumin: 3.6 g/dL (ref 3.5–5.2)
Alkaline Phosphatase: 70 U/L (ref 39–117)
Calcium: 9.6 mg/dL (ref 8.4–10.5)
Potassium: 4.5 mEq/L (ref 3.5–5.1)
Sodium: 138 mEq/L (ref 135–145)
Total Protein: 8.6 g/dL — ABNORMAL HIGH (ref 6.0–8.3)

## 2011-08-10 LAB — LIPID PANEL
LDL Cholesterol: 52 mg/dL (ref 0–99)
Triglycerides: 200 mg/dL — ABNORMAL HIGH (ref <150)
VLDL: 40 mg/dL (ref 0–40)

## 2011-08-10 MED ORDER — EPOETIN ALFA 10000 UNIT/ML IJ SOLN: 20000.0000 [IU] | INTRAMUSCULAR | Status: DC

## 2011-08-11 LAB — TACROLIMUS LEVEL: Tacrolimus (FK506) - LabCorp: 6.7 ng/mL

## 2011-08-24 ENCOUNTER — Encounter (HOSPITAL_COMMUNITY)
Admission: RE | Admit: 2011-08-24 | Discharge: 2011-08-24 | Disposition: A | Discharge status: Home / Self Care | Payer: BC Managed Care – PPO | Source: Ambulatory Visit | Attending: Nephrology | Admitting: Nephrology

## 2011-08-24 LAB — POCT HEMOGLOBIN-HEMACUE: Hemoglobin: 9.8 g/dL — ABNORMAL LOW (ref 13.0–17.0)

## 2011-08-24 MED ORDER — EPOETIN ALFA 20000 UNIT/ML IJ SOLN
INTRAMUSCULAR | Status: AC
  Administered 2011-08-24: 20000 [IU] via SUBCUTANEOUS
  Filled 2011-08-24: qty 1

## 2011-08-24 MED ORDER — EPOETIN ALFA 10000 UNIT/ML IJ SOLN: 20000.0000 [IU] | INTRAMUSCULAR | Status: DC

## 2011-09-14 ENCOUNTER — Encounter (HOSPITAL_COMMUNITY)
Admission: RE | Admit: 2011-09-14 | Discharge: 2011-09-14 | Disposition: A | Discharge status: Home / Self Care | Payer: BC Managed Care – PPO | Source: Ambulatory Visit | Attending: Nephrology | Admitting: Nephrology

## 2011-09-14 DIAGNOSIS — N183 Chronic kidney disease, stage 3 unspecified: Secondary | ICD-10-CM | POA: Insufficient documentation

## 2011-09-14 DIAGNOSIS — D638 Anemia in other chronic diseases classified elsewhere: Secondary | ICD-10-CM | POA: Insufficient documentation

## 2011-09-14 LAB — IRON AND TIBC: TIBC: 180 ug/dL — ABNORMAL LOW (ref 215–435)

## 2011-09-14 MED ORDER — EPOETIN ALFA 20000 UNIT/ML IJ SOLN
INTRAMUSCULAR | Status: AC
  Administered 2011-09-14: 20000 [IU] via SUBCUTANEOUS
  Filled 2011-09-14: qty 1

## 2011-09-14 MED ORDER — EPOETIN ALFA 10000 UNIT/ML IJ SOLN: 20000.0000 [IU] | INTRAMUSCULAR | Status: DC

## 2011-09-17 LAB — PTH, INTACT AND CALCIUM: PTH: 104.5 pg/mL — ABNORMAL HIGH (ref 14.0–72.0)

## 2011-10-05 ENCOUNTER — Encounter (HOSPITAL_COMMUNITY)
Admission: RE | Admit: 2011-10-05 | Discharge: 2011-10-05 | Disposition: A | Discharge status: Home / Self Care | Payer: BC Managed Care – PPO | Source: Ambulatory Visit | Attending: Nephrology | Admitting: Nephrology

## 2011-10-05 DIAGNOSIS — N183 Chronic kidney disease, stage 3 unspecified: Secondary | ICD-10-CM | POA: Insufficient documentation

## 2011-10-05 DIAGNOSIS — D638 Anemia in other chronic diseases classified elsewhere: Secondary | ICD-10-CM | POA: Insufficient documentation

## 2011-10-05 MED ORDER — EPOETIN ALFA 20000 UNIT/ML IJ SOLN
INTRAMUSCULAR | Status: AC
  Administered 2011-10-05: 20000 [IU] via SUBCUTANEOUS
  Filled 2011-10-05: qty 1

## 2011-10-05 MED ORDER — EPOETIN ALFA 10000 UNIT/ML IJ SOLN: 20000.0000 [IU] | INTRAMUSCULAR | Status: DC

## 2011-10-25 ENCOUNTER — Other Ambulatory Visit (HOSPITAL_COMMUNITY): Payer: Self-pay | Admitting: *Deleted

## 2011-10-26 ENCOUNTER — Encounter (HOSPITAL_COMMUNITY)
Admission: RE | Admit: 2011-10-26 | Discharge: 2011-10-26 | Disposition: A | Discharge status: Home / Self Care | Payer: BC Managed Care – PPO | Source: Ambulatory Visit | Attending: Nephrology | Admitting: Nephrology

## 2011-10-26 MED ORDER — EPOETIN ALFA 20000 UNIT/ML IJ SOLN
INTRAMUSCULAR | Status: AC
  Administered 2011-10-26: 20000 [IU] via SUBCUTANEOUS
  Filled 2011-10-26: qty 1

## 2011-10-26 MED ORDER — EPOETIN ALFA 10000 UNIT/ML IJ SOLN: 20000.0000 [IU] | INTRAMUSCULAR | Status: DC

## 2011-11-16 ENCOUNTER — Encounter (HOSPITAL_COMMUNITY)
Admission: RE | Admit: 2011-11-16 | Discharge: 2011-11-16 | Disposition: A | Discharge status: Home / Self Care | Payer: BC Managed Care – PPO | Source: Ambulatory Visit | Attending: Nephrology | Admitting: Nephrology

## 2011-11-16 DIAGNOSIS — N183 Chronic kidney disease, stage 3 unspecified: Secondary | ICD-10-CM | POA: Insufficient documentation

## 2011-11-16 DIAGNOSIS — D638 Anemia in other chronic diseases classified elsewhere: Secondary | ICD-10-CM | POA: Insufficient documentation

## 2011-11-16 LAB — MAGNESIUM: Magnesium: 1.5 mg/dL (ref 1.5–2.5)

## 2011-11-16 LAB — COMPREHENSIVE METABOLIC PANEL
Albumin: 3.5 g/dL (ref 3.5–5.2)
BUN: 33 mg/dL — ABNORMAL HIGH (ref 6–23)
Creatinine, Ser: 2.8 mg/dL — ABNORMAL HIGH (ref 0.50–1.35)
GFR calc Af Amer: 26 mL/min — ABNORMAL LOW (ref >90)
Glucose, Bld: 100 mg/dL — ABNORMAL HIGH (ref 70–99)
Total Protein: 8 g/dL (ref 6.0–8.3)

## 2011-11-16 LAB — PHOSPHORUS: Phosphorus: 2.6 mg/dL (ref 2.3–4.6)

## 2011-11-16 LAB — POCT HEMOGLOBIN-HEMACUE: Hemoglobin: 11 g/dL — ABNORMAL LOW (ref 13.0–17.0)

## 2011-11-16 LAB — DIFFERENTIAL
Basophils Relative: 0 % (ref 0–1)
Eosinophils Absolute: 0.1 10*3/uL (ref 0.0–0.7)
Eosinophils Relative: 2 % (ref 0–5)
Monocytes Absolute: 0.4 10*3/uL (ref 0.1–1.0)
Monocytes Relative: 7 % (ref 3–12)

## 2011-11-16 LAB — LIPID PANEL
Cholesterol: 134 mg/dL (ref 0–200)
LDL Cholesterol: 48 mg/dL (ref 0–99)
Triglycerides: 172 mg/dL — ABNORMAL HIGH (ref <150)

## 2011-11-16 MED ORDER — EPOETIN ALFA 20000 UNIT/ML IJ SOLN
INTRAMUSCULAR | Status: AC
  Administered 2011-11-16: 20000 [IU]
  Filled 2011-11-16: qty 1

## 2011-11-16 MED ORDER — EPOETIN ALFA 10000 UNIT/ML IJ SOLN: 20000.0000 [IU] | INTRAMUSCULAR | Status: DC

## 2011-11-16 NOTE — Progress Notes (Signed)
Francisco Williamson could not void today and we could not collect his urinalysis.  He also did not have time towait for the result of his cbc he had to be at the bank by 5

## 2011-11-19 LAB — PTH, INTACT AND CALCIUM: Calcium, Total (PTH): 9.2 mg/dL (ref 8.4–10.5)

## 2011-12-07 ENCOUNTER — Encounter (HOSPITAL_COMMUNITY): Payer: BC Managed Care – PPO

## 2011-12-10 ENCOUNTER — Encounter (HOSPITAL_COMMUNITY)
Admission: RE | Admit: 2011-12-10 | Discharge: 2011-12-10 | Disposition: A | Discharge status: Home / Self Care | Payer: BC Managed Care – PPO | Source: Ambulatory Visit | Attending: Nephrology | Admitting: Nephrology

## 2011-12-10 DIAGNOSIS — D638 Anemia in other chronic diseases classified elsewhere: Secondary | ICD-10-CM | POA: Insufficient documentation

## 2011-12-10 DIAGNOSIS — N183 Chronic kidney disease, stage 3 unspecified: Secondary | ICD-10-CM | POA: Insufficient documentation

## 2011-12-10 LAB — URINALYSIS, MICROSCOPIC ONLY
Leukocytes, UA: NEGATIVE
Nitrite: NEGATIVE
Specific Gravity, Urine: 1.017 (ref 1.005–1.030)
Urobilinogen, UA: 1 mg/dL (ref 0.0–1.0)

## 2011-12-10 LAB — POCT HEMOGLOBIN-HEMACUE: Hemoglobin: 10.1 g/dL — ABNORMAL LOW (ref 13.0–17.0)

## 2011-12-10 LAB — FERRITIN: Ferritin: 747 ng/mL — ABNORMAL HIGH (ref 22–322)

## 2011-12-10 LAB — IRON AND TIBC: UIBC: 108 ug/dL — ABNORMAL LOW (ref 125–400)

## 2011-12-10 MED ORDER — EPOETIN ALFA 20000 UNIT/ML IJ SOLN
INTRAMUSCULAR | Status: AC
  Administered 2011-12-10: 20000 [IU] via SUBCUTANEOUS
  Filled 2011-12-10: qty 1

## 2012-01-04 ENCOUNTER — Encounter (HOSPITAL_COMMUNITY)
Admission: RE | Admit: 2012-01-04 | Discharge: 2012-01-04 | Disposition: A | Discharge status: Home / Self Care | Payer: BC Managed Care – PPO | Source: Ambulatory Visit | Attending: Nephrology | Admitting: Nephrology

## 2012-01-04 DIAGNOSIS — N183 Chronic kidney disease, stage 3 unspecified: Secondary | ICD-10-CM | POA: Insufficient documentation

## 2012-01-04 DIAGNOSIS — D638 Anemia in other chronic diseases classified elsewhere: Secondary | ICD-10-CM | POA: Insufficient documentation

## 2012-01-04 MED ORDER — EPOETIN ALFA 10000 UNIT/ML IJ SOLN: 20000.0000 [IU] | INTRAMUSCULAR | Status: DC

## 2012-01-04 MED ORDER — EPOETIN ALFA 20000 UNIT/ML IJ SOLN
INTRAMUSCULAR | Status: AC
  Administered 2012-01-04: 20000 [IU] via SUBCUTANEOUS
  Filled 2012-01-04: qty 1

## 2012-01-24 ENCOUNTER — Other Ambulatory Visit (HOSPITAL_COMMUNITY): Payer: Self-pay | Admitting: *Deleted

## 2012-01-24 ENCOUNTER — Encounter (HOSPITAL_COMMUNITY): Payer: BC Managed Care – PPO

## 2012-01-25 ENCOUNTER — Encounter (HOSPITAL_COMMUNITY): Payer: BC Managed Care – PPO

## 2012-01-28 ENCOUNTER — Encounter (HOSPITAL_COMMUNITY)
Admission: RE | Admit: 2012-01-28 | Discharge: 2012-01-28 | Disposition: A | Discharge status: Home / Self Care | Payer: BC Managed Care – PPO | Source: Ambulatory Visit | Attending: Nephrology | Admitting: Nephrology

## 2012-01-28 MED ORDER — EPOETIN ALFA 20000 UNIT/ML IJ SOLN
INTRAMUSCULAR | Status: AC
  Administered 2012-01-28: 20000 [IU] via SUBCUTANEOUS
  Filled 2012-01-28: qty 1

## 2012-01-28 MED ORDER — EPOETIN ALFA 10000 UNIT/ML IJ SOLN: 20000.0000 [IU] | INTRAMUSCULAR | Status: DC

## 2012-02-22 ENCOUNTER — Encounter (HOSPITAL_COMMUNITY)
Admission: RE | Admit: 2012-02-22 | Discharge: 2012-02-22 | Disposition: A | Discharge status: Home / Self Care | Payer: BC Managed Care – PPO | Source: Ambulatory Visit | Attending: Nephrology | Admitting: Nephrology

## 2012-02-22 DIAGNOSIS — D638 Anemia in other chronic diseases classified elsewhere: Secondary | ICD-10-CM | POA: Insufficient documentation

## 2012-02-22 DIAGNOSIS — N183 Chronic kidney disease, stage 3 unspecified: Secondary | ICD-10-CM | POA: Insufficient documentation

## 2012-02-22 LAB — DIFFERENTIAL
Basophils Relative: 0 % (ref 0–1)
Lymphocytes Relative: 10 % — ABNORMAL LOW (ref 12–46)
Lymphs Abs: 1 10*3/uL (ref 0.7–4.0)
Monocytes Relative: 2 % — ABNORMAL LOW (ref 3–12)
Neutro Abs: 8.5 10*3/uL — ABNORMAL HIGH (ref 1.7–7.7)
Neutrophils Relative %: 87 % — ABNORMAL HIGH (ref 43–77)

## 2012-02-22 LAB — COMPREHENSIVE METABOLIC PANEL
Albumin: 3.6 g/dL (ref 3.5–5.2)
Alkaline Phosphatase: 71 U/L (ref 39–117)
BUN: 28 mg/dL — ABNORMAL HIGH (ref 6–23)
CO2: 22 mEq/L (ref 19–32)
Chloride: 103 mEq/L (ref 96–112)
GFR calc non Af Amer: 25 mL/min — ABNORMAL LOW (ref >90)
Potassium: 5.1 mEq/L (ref 3.5–5.1)
Total Bilirubin: 0.3 mg/dL (ref 0.3–1.2)

## 2012-02-22 LAB — CBC
HCT: 30.9 % — ABNORMAL LOW (ref 39.0–52.0)
Hemoglobin: 10.4 g/dL — ABNORMAL LOW (ref 13.0–17.0)
RBC: 3.57 MIL/uL — ABNORMAL LOW (ref 4.22–5.81)
WBC: 9.8 10*3/uL (ref 4.0–10.5)

## 2012-02-22 LAB — PHOSPHORUS: Phosphorus: 1.9 mg/dL — ABNORMAL LOW (ref 2.3–4.6)

## 2012-02-22 MED ORDER — EPOETIN ALFA 20000 UNIT/ML IJ SOLN
INTRAMUSCULAR | Status: AC
  Administered 2012-02-22: 20000 [IU] via SUBCUTANEOUS
  Filled 2012-02-22: qty 1

## 2012-02-22 MED ORDER — EPOETIN ALFA 10000 UNIT/ML IJ SOLN: 20000.0000 [IU] | INTRAMUSCULAR | Status: DC

## 2012-02-25 LAB — PTH, INTACT AND CALCIUM
Calcium, Total (PTH): 9.1 mg/dL (ref 8.4–10.5)
PTH: 148.7 pg/mL — ABNORMAL HIGH (ref 14.0–72.0)

## 2012-03-14 ENCOUNTER — Encounter (HOSPITAL_COMMUNITY)
Admission: RE | Admit: 2012-03-14 | Discharge: 2012-03-14 | Disposition: A | Discharge status: Home / Self Care | Payer: BC Managed Care – PPO | Source: Ambulatory Visit | Attending: Nephrology | Admitting: Nephrology

## 2012-03-14 DIAGNOSIS — N183 Chronic kidney disease, stage 3 unspecified: Secondary | ICD-10-CM | POA: Insufficient documentation

## 2012-03-14 DIAGNOSIS — D638 Anemia in other chronic diseases classified elsewhere: Secondary | ICD-10-CM | POA: Insufficient documentation

## 2012-03-14 LAB — URINALYSIS, MICROSCOPIC ONLY
Bilirubin Urine: NEGATIVE
Glucose, UA: NEGATIVE mg/dL
Hgb urine dipstick: NEGATIVE
Ketones, ur: NEGATIVE mg/dL
Nitrite: NEGATIVE
Protein, ur: NEGATIVE mg/dL
Specific Gravity, Urine: 1.019 (ref 1.005–1.030)
Urobilinogen, UA: 0.2 mg/dL (ref 0.0–1.0)
pH: 5 (ref 5.0–8.0)

## 2012-03-14 LAB — LIPID PANEL
Cholesterol: 162 mg/dL (ref 0–200)
HDL: 50 mg/dL
LDL Cholesterol: 51 mg/dL (ref 0–99)
Total CHOL/HDL Ratio: 3.2 ratio
Triglycerides: 306 mg/dL — ABNORMAL HIGH
VLDL: 61 mg/dL — ABNORMAL HIGH (ref 0–40)

## 2012-03-14 LAB — FERRITIN: Ferritin: 705 ng/mL — ABNORMAL HIGH (ref 22–322)

## 2012-03-14 LAB — IRON AND TIBC
Saturation Ratios: 33 % (ref 20–55)
TIBC: 187 ug/dL — ABNORMAL LOW (ref 215–435)

## 2012-03-14 LAB — POCT HEMOGLOBIN-HEMACUE: Hemoglobin: 9.9 g/dL — ABNORMAL LOW (ref 13.0–17.0)

## 2012-03-14 MED ORDER — EPOETIN ALFA 10000 UNIT/ML IJ SOLN: 20000.0000 [IU] | INTRAMUSCULAR | Status: DC

## 2012-03-14 MED ORDER — EPOETIN ALFA 20000 UNIT/ML IJ SOLN
INTRAMUSCULAR | Status: AC
  Administered 2012-03-14: 20000 [IU] via SUBCUTANEOUS
  Filled 2012-03-14: qty 1

## 2012-04-04 ENCOUNTER — Encounter (HOSPITAL_COMMUNITY)
Admission: RE | Admit: 2012-04-04 | Discharge: 2012-04-04 | Disposition: A | Discharge status: Home / Self Care | Payer: BC Managed Care – PPO | Source: Ambulatory Visit | Attending: Nephrology | Admitting: Nephrology

## 2012-04-04 LAB — POCT HEMOGLOBIN-HEMACUE: Hemoglobin: 10 g/dL — ABNORMAL LOW (ref 13.0–17.0)

## 2012-04-04 MED ORDER — EPOETIN ALFA 10000 UNIT/ML IJ SOLN: 20000.0000 [IU] | INTRAMUSCULAR | Status: DC

## 2012-04-04 MED ORDER — EPOETIN ALFA 20000 UNIT/ML IJ SOLN
INTRAMUSCULAR | Status: AC
  Administered 2012-04-04: 20000 [IU] via SUBCUTANEOUS
  Filled 2012-04-04: qty 1

## 2012-04-25 ENCOUNTER — Encounter (HOSPITAL_COMMUNITY)
Admission: RE | Admit: 2012-04-25 | Discharge: 2012-04-25 | Disposition: A | Discharge status: Home / Self Care | Payer: BC Managed Care – PPO | Source: Ambulatory Visit | Attending: Nephrology | Admitting: Nephrology

## 2012-04-25 DIAGNOSIS — N183 Chronic kidney disease, stage 3 unspecified: Secondary | ICD-10-CM | POA: Insufficient documentation

## 2012-04-25 DIAGNOSIS — D638 Anemia in other chronic diseases classified elsewhere: Secondary | ICD-10-CM | POA: Insufficient documentation

## 2012-04-25 MED ORDER — EPOETIN ALFA 10000 UNIT/ML IJ SOLN: 20000.0000 [IU] | INTRAMUSCULAR | Status: DC

## 2012-04-25 MED ORDER — EPOETIN ALFA 20000 UNIT/ML IJ SOLN
INTRAMUSCULAR | Status: AC
  Administered 2012-04-25: 20000 [IU] via SUBCUTANEOUS
  Filled 2012-04-25: qty 1

## 2012-05-16 ENCOUNTER — Encounter (HOSPITAL_COMMUNITY)
Admission: RE | Admit: 2012-05-16 | Discharge: 2012-05-16 | Disposition: A | Discharge status: Home / Self Care | Payer: BC Managed Care – PPO | Source: Ambulatory Visit | Attending: Nephrology | Admitting: Nephrology

## 2012-05-16 DIAGNOSIS — N183 Chronic kidney disease, stage 3 unspecified: Secondary | ICD-10-CM | POA: Insufficient documentation

## 2012-05-16 DIAGNOSIS — D638 Anemia in other chronic diseases classified elsewhere: Secondary | ICD-10-CM | POA: Insufficient documentation

## 2012-05-16 LAB — COMPREHENSIVE METABOLIC PANEL
AST: 23 U/L (ref 0–37)
BUN: 26 mg/dL — ABNORMAL HIGH (ref 6–23)
CO2: 22 mEq/L (ref 19–32)
Calcium: 9.5 mg/dL (ref 8.4–10.5)
Chloride: 106 mEq/L (ref 96–112)
Creatinine, Ser: 2.56 mg/dL — ABNORMAL HIGH (ref 0.50–1.35)
GFR calc non Af Amer: 25 mL/min — ABNORMAL LOW (ref >90)
Total Bilirubin: 0.4 mg/dL (ref 0.3–1.2)

## 2012-05-16 LAB — DIFFERENTIAL
Basophils Absolute: 0 10*3/uL (ref 0.0–0.1)
Basophils Relative: 0 % (ref 0–1)
Eosinophils Relative: 1 % (ref 0–5)
Lymphocytes Relative: 20 % (ref 12–46)
Monocytes Absolute: 0.2 10*3/uL (ref 0.1–1.0)
Monocytes Relative: 3 % (ref 3–12)
Neutro Abs: 5 10*3/uL (ref 1.7–7.7)

## 2012-05-16 LAB — CBC
HCT: 34.5 % — ABNORMAL LOW (ref 39.0–52.0)
Hemoglobin: 11.9 g/dL — ABNORMAL LOW (ref 13.0–17.0)
MCHC: 34.5 g/dL (ref 30.0–36.0)
MCV: 86 fL (ref 78.0–100.0)
RDW: 13.5 % (ref 11.5–15.5)

## 2012-05-16 LAB — PHOSPHORUS: Phosphorus: 2.9 mg/dL (ref 2.3–4.6)

## 2012-05-16 LAB — MAGNESIUM: Magnesium: 1.5 mg/dL (ref 1.5–2.5)

## 2012-05-16 MED ORDER — EPOETIN ALFA 10000 UNIT/ML IJ SOLN: 20000.0000 [IU] | INTRAMUSCULAR | Status: DC

## 2012-05-18 LAB — TACROLIMUS LEVEL: Tacrolimus (FK506) - LabCorp: 2.9 ng/mL

## 2012-05-30 ENCOUNTER — Encounter (HOSPITAL_COMMUNITY)
Admission: RE | Admit: 2012-05-30 | Discharge: 2012-05-30 | Disposition: A | Discharge status: Home / Self Care | Payer: BC Managed Care – PPO | Source: Ambulatory Visit | Attending: Nephrology | Admitting: Nephrology

## 2012-05-30 MED ORDER — EPOETIN ALFA 10000 UNIT/ML IJ SOLN: 20000.0000 [IU] | INTRAMUSCULAR | Status: DC

## 2012-05-30 MED ORDER — EPOETIN ALFA 20000 UNIT/ML IJ SOLN
INTRAMUSCULAR | Status: AC
  Administered 2012-05-30: 20000 [IU] via SUBCUTANEOUS
  Filled 2012-05-30: qty 1

## 2012-06-20 ENCOUNTER — Encounter (HOSPITAL_COMMUNITY)
Admission: RE | Admit: 2012-06-20 | Discharge: 2012-06-20 | Disposition: A | Discharge status: Home / Self Care | Payer: BC Managed Care – PPO | Source: Ambulatory Visit | Attending: Nephrology | Admitting: Nephrology

## 2012-06-20 DIAGNOSIS — D638 Anemia in other chronic diseases classified elsewhere: Secondary | ICD-10-CM | POA: Insufficient documentation

## 2012-06-20 DIAGNOSIS — N183 Chronic kidney disease, stage 3 unspecified: Secondary | ICD-10-CM | POA: Insufficient documentation

## 2012-06-20 LAB — URINALYSIS, MICROSCOPIC ONLY
Glucose, UA: NEGATIVE mg/dL
Hgb urine dipstick: NEGATIVE
Ketones, ur: NEGATIVE mg/dL
pH: 5.5 (ref 5.0–8.0)

## 2012-06-20 LAB — LIPID PANEL: HDL: 63 mg/dL (ref >39)

## 2012-06-20 MED ORDER — EPOETIN ALFA 10000 UNIT/ML IJ SOLN: 20000.0000 [IU] | INTRAMUSCULAR | Status: DC

## 2012-06-20 MED ORDER — EPOETIN ALFA 20000 UNIT/ML IJ SOLN
INTRAMUSCULAR | Status: AC
  Administered 2012-06-20: 20000 [IU] via SUBCUTANEOUS
  Filled 2012-06-20: qty 1

## 2012-06-21 LAB — URINE CULTURE
Colony Count: NO GROWTH
Culture: NO GROWTH

## 2012-07-11 ENCOUNTER — Encounter (HOSPITAL_COMMUNITY)
Admission: RE | Admit: 2012-07-11 | Discharge: 2012-07-11 | Disposition: A | Discharge status: Home / Self Care | Payer: BC Managed Care – PPO | Source: Ambulatory Visit | Attending: Nephrology | Admitting: Nephrology

## 2012-07-11 DIAGNOSIS — D638 Anemia in other chronic diseases classified elsewhere: Secondary | ICD-10-CM | POA: Insufficient documentation

## 2012-07-11 DIAGNOSIS — N183 Chronic kidney disease, stage 3 unspecified: Secondary | ICD-10-CM | POA: Insufficient documentation

## 2012-07-11 MED ORDER — EPOETIN ALFA 20000 UNIT/ML IJ SOLN
INTRAMUSCULAR | Status: AC
  Administered 2012-07-11: 20000 [IU]
  Filled 2012-07-11: qty 1

## 2012-07-11 MED ORDER — EPOETIN ALFA 10000 UNIT/ML IJ SOLN: 20000.0000 [IU] | INTRAMUSCULAR | Status: DC

## 2012-07-14 LAB — POCT HEMOGLOBIN-HEMACUE: Hemoglobin: 9.6 g/dL — ABNORMAL LOW (ref 13.0–17.0)

## 2012-07-31 ENCOUNTER — Other Ambulatory Visit (HOSPITAL_COMMUNITY): Payer: Self-pay | Admitting: *Deleted

## 2012-08-01 ENCOUNTER — Encounter (HOSPITAL_COMMUNITY)
Admission: RE | Admit: 2012-08-01 | Discharge: 2012-08-01 | Disposition: A | Discharge status: Home / Self Care | Payer: BC Managed Care – PPO | Source: Ambulatory Visit | Attending: Nephrology | Admitting: Nephrology

## 2012-08-01 MED ORDER — EPOETIN ALFA 20000 UNIT/ML IJ SOLN
INTRAMUSCULAR | Status: AC
  Administered 2012-08-01: 20000 [IU] via SUBCUTANEOUS
  Filled 2012-08-01: qty 1

## 2012-08-01 MED ORDER — EPOETIN ALFA 10000 UNIT/ML IJ SOLN: 20000.0000 [IU] | INTRAMUSCULAR | Status: DC

## 2012-08-21 ENCOUNTER — Other Ambulatory Visit (HOSPITAL_COMMUNITY): Payer: Self-pay

## 2012-08-22 ENCOUNTER — Encounter (HOSPITAL_COMMUNITY): Payer: BC Managed Care – PPO

## 2012-08-25 ENCOUNTER — Encounter (HOSPITAL_COMMUNITY)
Admission: RE | Admit: 2012-08-25 | Discharge: 2012-08-25 | Disposition: A | Discharge status: Home / Self Care | Payer: BC Managed Care – PPO | Source: Ambulatory Visit | Attending: Nephrology | Admitting: Nephrology

## 2012-08-25 DIAGNOSIS — N183 Chronic kidney disease, stage 3 unspecified: Secondary | ICD-10-CM | POA: Insufficient documentation

## 2012-08-25 DIAGNOSIS — D638 Anemia in other chronic diseases classified elsewhere: Secondary | ICD-10-CM | POA: Insufficient documentation

## 2012-08-25 LAB — COMPREHENSIVE METABOLIC PANEL
Albumin: 3.3 g/dL — ABNORMAL LOW (ref 3.5–5.2)
BUN: 29 mg/dL — ABNORMAL HIGH (ref 6–23)
Calcium: 8.7 mg/dL (ref 8.4–10.5)
Creatinine, Ser: 2.71 mg/dL — ABNORMAL HIGH (ref 0.50–1.35)
GFR calc Af Amer: 27 mL/min — ABNORMAL LOW (ref >90)
Glucose, Bld: 99 mg/dL (ref 70–99)
Potassium: 5.1 mEq/L (ref 3.5–5.1)
Total Protein: 8.4 g/dL — ABNORMAL HIGH (ref 6.0–8.3)

## 2012-08-25 LAB — CBC
HCT: 30 % — ABNORMAL LOW (ref 39.0–52.0)
MCH: 29.3 pg (ref 26.0–34.0)
MCHC: 34.7 g/dL (ref 30.0–36.0)
MCV: 84.5 fL (ref 78.0–100.0)
RDW: 13.5 % (ref 11.5–15.5)

## 2012-08-25 LAB — PHOSPHORUS: Phosphorus: 2.7 mg/dL (ref 2.3–4.6)

## 2012-08-25 LAB — MAGNESIUM: Magnesium: 1.5 mg/dL (ref 1.5–2.5)

## 2012-08-25 LAB — IRON AND TIBC
Saturation Ratios: 36 % (ref 20–55)
TIBC: 148 ug/dL — ABNORMAL LOW (ref 215–435)
UIBC: 94 ug/dL — ABNORMAL LOW (ref 125–400)

## 2012-08-25 LAB — POCT HEMOGLOBIN-HEMACUE: Hemoglobin: 10.5 g/dL — ABNORMAL LOW (ref 13.0–17.0)

## 2012-08-25 MED ORDER — EPOETIN ALFA 10000 UNIT/ML IJ SOLN: 30000.0000 [IU] | INTRAMUSCULAR | Status: DC

## 2012-08-25 MED ORDER — EPOETIN ALFA 10000 UNIT/ML IJ SOLN
INTRAMUSCULAR | Status: AC
  Administered 2012-08-25: 10000 [IU] via SUBCUTANEOUS
  Filled 2012-08-25: qty 1

## 2012-08-25 MED ORDER — EPOETIN ALFA 20000 UNIT/ML IJ SOLN
INTRAMUSCULAR | Status: AC
  Administered 2012-08-25: 20000 [IU] via SUBCUTANEOUS
  Filled 2012-08-25: qty 1

## 2012-08-26 LAB — PTH, INTACT AND CALCIUM: Calcium, Total (PTH): 9.1 mg/dL (ref 8.4–10.5)

## 2012-08-27 LAB — TACROLIMUS LEVEL: Tacrolimus (FK506) - LabCorp: 4.9 ng/mL

## 2012-09-19 ENCOUNTER — Encounter (HOSPITAL_COMMUNITY): Payer: BC Managed Care – PPO

## 2012-09-26 ENCOUNTER — Encounter (HOSPITAL_COMMUNITY)
Admission: RE | Admit: 2012-09-26 | Discharge: 2012-09-26 | Disposition: A | Discharge status: Home / Self Care | Payer: BC Managed Care – PPO | Source: Ambulatory Visit | Attending: Nephrology | Admitting: Nephrology

## 2012-09-26 DIAGNOSIS — D638 Anemia in other chronic diseases classified elsewhere: Secondary | ICD-10-CM | POA: Insufficient documentation

## 2012-09-26 DIAGNOSIS — N183 Chronic kidney disease, stage 3 unspecified: Secondary | ICD-10-CM | POA: Insufficient documentation

## 2012-09-26 MED ORDER — EPOETIN ALFA 20000 UNIT/ML IJ SOLN
INTRAMUSCULAR | Status: AC
  Administered 2012-09-26: 30000 [IU]
  Filled 2012-09-26: qty 1

## 2012-09-26 MED ORDER — EPOETIN ALFA 10000 UNIT/ML IJ SOLN
INTRAMUSCULAR | Status: AC
  Filled 2012-09-26: qty 1

## 2012-09-26 MED ORDER — EPOETIN ALFA 10000 UNIT/ML IJ SOLN: 30000.0000 [IU] | INTRAMUSCULAR | Status: DC

## 2012-09-29 MED FILL — Epoetin Alfa Inj 10000 Unit/ML: INTRAMUSCULAR | Qty: 1 | Status: AC

## 2012-09-29 MED FILL — Epoetin Alfa Inj 20000 Unit/ML: INTRAMUSCULAR | Qty: 1 | Status: AC

## 2012-10-16 ENCOUNTER — Encounter (HOSPITAL_COMMUNITY)
Admission: RE | Admit: 2012-10-16 | Discharge: 2012-10-16 | Disposition: A | Discharge status: Home / Self Care | Payer: BC Managed Care – PPO | Source: Ambulatory Visit | Attending: Nephrology | Admitting: Nephrology

## 2012-10-16 DIAGNOSIS — D638 Anemia in other chronic diseases classified elsewhere: Secondary | ICD-10-CM | POA: Insufficient documentation

## 2012-10-16 DIAGNOSIS — N183 Chronic kidney disease, stage 3 unspecified: Secondary | ICD-10-CM | POA: Insufficient documentation

## 2012-10-16 LAB — POCT HEMOGLOBIN-HEMACUE: Hemoglobin: 9.2 g/dL — ABNORMAL LOW (ref 13.0–17.0)

## 2012-10-16 MED ORDER — EPOETIN ALFA 10000 UNIT/ML IJ SOLN: 30000.0000 [IU] | INTRAMUSCULAR | Status: DC

## 2012-10-16 MED ORDER — EPOETIN ALFA 10000 UNIT/ML IJ SOLN
INTRAMUSCULAR | Status: AC
  Administered 2012-10-16: 10000 [IU] via SUBCUTANEOUS
  Filled 2012-10-16: qty 1

## 2012-10-16 MED ORDER — EPOETIN ALFA 20000 UNIT/ML IJ SOLN
INTRAMUSCULAR | Status: AC
  Administered 2012-10-16: 20000 [IU] via SUBCUTANEOUS
  Filled 2012-10-16: qty 1

## 2012-11-06 ENCOUNTER — Other Ambulatory Visit (HOSPITAL_COMMUNITY): Payer: Self-pay | Admitting: *Deleted

## 2012-11-07 ENCOUNTER — Encounter (HOSPITAL_COMMUNITY): Payer: BC Managed Care – PPO

## 2012-11-14 ENCOUNTER — Encounter (HOSPITAL_COMMUNITY)
Admission: RE | Admit: 2012-11-14 | Discharge: 2012-11-14 | Disposition: A | Discharge status: Home / Self Care | Payer: BC Managed Care – PPO | Source: Ambulatory Visit | Attending: Nephrology | Admitting: Nephrology

## 2012-11-14 DIAGNOSIS — D638 Anemia in other chronic diseases classified elsewhere: Secondary | ICD-10-CM | POA: Insufficient documentation

## 2012-11-14 DIAGNOSIS — N183 Chronic kidney disease, stage 3 unspecified: Secondary | ICD-10-CM | POA: Insufficient documentation

## 2012-11-14 LAB — POCT HEMOGLOBIN-HEMACUE: Hemoglobin: 9.2 g/dL — ABNORMAL LOW (ref 13.0–17.0)

## 2012-11-14 MED ORDER — EPOETIN ALFA 10000 UNIT/ML IJ SOLN
INTRAMUSCULAR | Status: AC
  Administered 2012-11-14: 10000 [IU] via SUBCUTANEOUS
  Filled 2012-11-14: qty 1

## 2012-11-14 MED ORDER — EPOETIN ALFA 20000 UNIT/ML IJ SOLN
INTRAMUSCULAR | Status: AC
  Administered 2012-11-14: 20000 [IU] via SUBCUTANEOUS
  Filled 2012-11-14: qty 1

## 2012-11-14 MED ORDER — EPOETIN ALFA 10000 UNIT/ML IJ SOLN: 30000.0000 [IU] | INTRAMUSCULAR | Status: DC

## 2012-12-05 ENCOUNTER — Encounter (HOSPITAL_COMMUNITY)
Admission: RE | Admit: 2012-12-05 | Discharge: 2012-12-05 | Disposition: A | Discharge status: Home / Self Care | Payer: BC Managed Care – PPO | Source: Ambulatory Visit | Attending: Nephrology | Admitting: Nephrology

## 2012-12-05 DIAGNOSIS — D638 Anemia in other chronic diseases classified elsewhere: Secondary | ICD-10-CM | POA: Insufficient documentation

## 2012-12-05 DIAGNOSIS — N183 Chronic kidney disease, stage 3 unspecified: Secondary | ICD-10-CM | POA: Insufficient documentation

## 2012-12-05 LAB — COMPREHENSIVE METABOLIC PANEL
ALT: 13 U/L (ref 0–53)
AST: 28 U/L (ref 0–37)
Alkaline Phosphatase: 83 U/L (ref 39–117)
CO2: 21 mEq/L (ref 19–32)
Chloride: 106 mEq/L (ref 96–112)
Creatinine, Ser: 2.34 mg/dL — ABNORMAL HIGH (ref 0.50–1.35)
GFR calc Af Amer: 32 mL/min — ABNORMAL LOW (ref >90)
GFR calc non Af Amer: 28 mL/min — ABNORMAL LOW (ref >90)
Total Bilirubin: 0.2 mg/dL — ABNORMAL LOW (ref 0.3–1.2)

## 2012-12-05 LAB — LIPID PANEL
HDL: 57 mg/dL (ref >39)
LDL Cholesterol: 57 mg/dL (ref 0–99)
Total CHOL/HDL Ratio: 2.7 RATIO
VLDL: 42 mg/dL — ABNORMAL HIGH (ref 0–40)

## 2012-12-05 LAB — URINALYSIS, ROUTINE W REFLEX MICROSCOPIC
Ketones, ur: NEGATIVE mg/dL
Leukocytes, UA: NEGATIVE
Nitrite: NEGATIVE
Protein, ur: NEGATIVE mg/dL
Urobilinogen, UA: 1 mg/dL (ref 0.0–1.0)
pH: 6 (ref 5.0–8.0)

## 2012-12-05 LAB — IRON AND TIBC
Iron: 43 ug/dL (ref 42–135)
UIBC: 130 ug/dL (ref 125–400)

## 2012-12-05 LAB — CBC
HCT: 28.9 % — ABNORMAL LOW (ref 39.0–52.0)
MCHC: 35.3 g/dL (ref 30.0–36.0)
MCV: 85 fL (ref 78.0–100.0)
Platelets: 185 10*3/uL (ref 150–400)
RBC: 3.4 MIL/uL — ABNORMAL LOW (ref 4.22–5.81)
WBC: 6.4 10*3/uL (ref 4.0–10.5)

## 2012-12-05 LAB — FERRITIN: Ferritin: 743 ng/mL — ABNORMAL HIGH (ref 22–322)

## 2012-12-05 LAB — MAGNESIUM: Magnesium: 1.5 mg/dL (ref 1.5–2.5)

## 2012-12-05 MED ORDER — EPOETIN ALFA 10000 UNIT/ML IJ SOLN: 30000.0000 [IU] | INTRAMUSCULAR | Status: DC

## 2012-12-05 MED ORDER — EPOETIN ALFA 20000 UNIT/ML IJ SOLN
INTRAMUSCULAR | Status: AC
  Administered 2012-12-05: 20000 [IU] via SUBCUTANEOUS
  Filled 2012-12-05: qty 1

## 2012-12-05 MED ORDER — EPOETIN ALFA 10000 UNIT/ML IJ SOLN
INTRAMUSCULAR | Status: AC
  Administered 2012-12-05: 10000 [IU] via SUBCUTANEOUS
  Filled 2012-12-05: qty 1

## 2012-12-08 LAB — PTH, INTACT AND CALCIUM
Calcium, Total (PTH): 9 mg/dL (ref 8.4–10.5)
PTH: 109.5 pg/mL — ABNORMAL HIGH (ref 14.0–72.0)

## 2012-12-09 LAB — TACROLIMUS LEVEL: Tacrolimus (FK506) - LabCorp: 3.7 ng/mL

## 2012-12-26 ENCOUNTER — Encounter (HOSPITAL_COMMUNITY)
Admission: RE | Admit: 2012-12-26 | Discharge: 2012-12-26 | Disposition: A | Discharge status: Home / Self Care | Payer: BC Managed Care – PPO | Source: Ambulatory Visit | Attending: Nephrology | Admitting: Nephrology

## 2012-12-26 LAB — POCT HEMOGLOBIN-HEMACUE: Hemoglobin: 10.2 g/dL — ABNORMAL LOW (ref 13.0–17.0)

## 2012-12-26 MED ORDER — EPOETIN ALFA 20000 UNIT/ML IJ SOLN
INTRAMUSCULAR | Status: AC
  Administered 2012-12-26: 20000 [IU] via SUBCUTANEOUS
  Filled 2012-12-26: qty 1

## 2012-12-26 MED ORDER — EPOETIN ALFA 10000 UNIT/ML IJ SOLN: 30000.0000 [IU] | INTRAMUSCULAR | Status: DC

## 2012-12-26 MED ORDER — EPOETIN ALFA 10000 UNIT/ML IJ SOLN
INTRAMUSCULAR | Status: AC
  Administered 2012-12-26: 10000 [IU] via SUBCUTANEOUS
  Filled 2012-12-26: qty 1

## 2013-01-16 ENCOUNTER — Encounter (HOSPITAL_COMMUNITY)
Admission: RE | Admit: 2013-01-16 | Discharge: 2013-01-16 | Disposition: A | Discharge status: Home / Self Care | Payer: BC Managed Care – PPO | Source: Ambulatory Visit | Attending: Nephrology | Admitting: Nephrology

## 2013-01-16 DIAGNOSIS — N183 Chronic kidney disease, stage 3 unspecified: Secondary | ICD-10-CM | POA: Insufficient documentation

## 2013-01-16 DIAGNOSIS — D638 Anemia in other chronic diseases classified elsewhere: Secondary | ICD-10-CM | POA: Insufficient documentation

## 2013-01-16 MED ORDER — EPOETIN ALFA 10000 UNIT/ML IJ SOLN: 30000.0000 [IU] | INTRAMUSCULAR | Status: DC

## 2013-01-16 MED ORDER — EPOETIN ALFA 20000 UNIT/ML IJ SOLN
INTRAMUSCULAR | Status: AC
  Administered 2013-01-16: 20000 [IU] via SUBCUTANEOUS
  Filled 2013-01-16: qty 1

## 2013-01-16 MED ORDER — EPOETIN ALFA 10000 UNIT/ML IJ SOLN
INTRAMUSCULAR | Status: AC
  Administered 2013-01-16: 16:00:00 10000 [IU] via SUBCUTANEOUS
  Filled 2013-01-16: qty 1

## 2013-02-05 ENCOUNTER — Other Ambulatory Visit (HOSPITAL_COMMUNITY): Payer: Self-pay | Admitting: *Deleted

## 2013-02-06 ENCOUNTER — Encounter (HOSPITAL_COMMUNITY)
Admission: RE | Admit: 2013-02-06 | Discharge: 2013-02-06 | Disposition: A | Discharge status: Home / Self Care | Payer: BC Managed Care – PPO | Source: Ambulatory Visit | Attending: Nephrology | Admitting: Nephrology

## 2013-02-06 DIAGNOSIS — D638 Anemia in other chronic diseases classified elsewhere: Secondary | ICD-10-CM | POA: Insufficient documentation

## 2013-02-06 DIAGNOSIS — N183 Chronic kidney disease, stage 3 unspecified: Secondary | ICD-10-CM | POA: Insufficient documentation

## 2013-02-06 LAB — POCT HEMOGLOBIN-HEMACUE: Hemoglobin: 10.2 g/dL — ABNORMAL LOW (ref 13.0–17.0)

## 2013-02-06 MED ORDER — EPOETIN ALFA 10000 UNIT/ML IJ SOLN
INTRAMUSCULAR | Status: AC
  Administered 2013-02-06: 16:00:00 10000 [IU] via SUBCUTANEOUS
  Filled 2013-02-06: qty 1

## 2013-02-06 MED ORDER — EPOETIN ALFA 20000 UNIT/ML IJ SOLN
INTRAMUSCULAR | Status: AC
  Administered 2013-02-06: 16:00:00 20000 [IU] via SUBCUTANEOUS
  Filled 2013-02-06: qty 1

## 2013-02-06 MED ORDER — EPOETIN ALFA 10000 UNIT/ML IJ SOLN: 30000.0000 [IU] | INTRAMUSCULAR | Status: DC

## 2013-02-27 ENCOUNTER — Encounter (HOSPITAL_COMMUNITY): Payer: BC Managed Care – PPO

## 2013-03-06 ENCOUNTER — Encounter (HOSPITAL_COMMUNITY)
Admission: RE | Admit: 2013-03-06 | Discharge: 2013-03-06 | Disposition: A | Discharge status: Home / Self Care | Payer: BC Managed Care – PPO | Source: Ambulatory Visit | Attending: Nephrology | Admitting: Nephrology

## 2013-03-06 DIAGNOSIS — N183 Chronic kidney disease, stage 3 unspecified: Secondary | ICD-10-CM | POA: Insufficient documentation

## 2013-03-06 DIAGNOSIS — D638 Anemia in other chronic diseases classified elsewhere: Secondary | ICD-10-CM | POA: Insufficient documentation

## 2013-03-06 LAB — URINALYSIS W MICROSCOPIC + REFLEX CULTURE
BILIRUBIN URINE: NEGATIVE
GLUCOSE, UA: NEGATIVE mg/dL
Hgb urine dipstick: NEGATIVE
Ketones, ur: NEGATIVE mg/dL
Nitrite: NEGATIVE
Protein, ur: NEGATIVE mg/dL
Specific Gravity, Urine: 1.017 (ref 1.005–1.030)
Urobilinogen, UA: 0.2 mg/dL (ref 0.0–1.0)
pH: 5.5 (ref 5.0–8.0)

## 2013-03-06 LAB — COMPREHENSIVE METABOLIC PANEL
ALBUMIN: 3.3 g/dL — AB (ref 3.5–5.2)
ALT: 9 U/L (ref 0–53)
AST: 17 U/L (ref 0–37)
Alkaline Phosphatase: 74 U/L (ref 39–117)
BUN: 32 mg/dL — ABNORMAL HIGH (ref 6–23)
CALCIUM: 8.8 mg/dL (ref 8.4–10.5)
CO2: 22 mEq/L (ref 19–32)
Chloride: 105 mEq/L (ref 96–112)
Creatinine, Ser: 2.69 mg/dL — ABNORMAL HIGH (ref 0.50–1.35)
GFR calc Af Amer: 27 mL/min — ABNORMAL LOW (ref >90)
GFR, EST NON AFRICAN AMERICAN: 23 mL/min — AB (ref >90)
Glucose, Bld: 78 mg/dL (ref 70–99)
Potassium: 5.1 mEq/L (ref 3.7–5.3)
SODIUM: 136 meq/L — AB (ref 137–147)
Total Bilirubin: 0.5 mg/dL (ref 0.3–1.2)
Total Protein: 8.8 g/dL — ABNORMAL HIGH (ref 6.0–8.3)

## 2013-03-06 LAB — IRON AND TIBC
Iron: 91 ug/dL (ref 42–135)
Saturation Ratios: 57 % — ABNORMAL HIGH (ref 20–55)
TIBC: 160 ug/dL — ABNORMAL LOW (ref 215–435)
UIBC: 69 ug/dL — ABNORMAL LOW (ref 125–400)

## 2013-03-06 LAB — CBC
HCT: 31.4 % — ABNORMAL LOW (ref 39.0–52.0)
Hemoglobin: 10.7 g/dL — ABNORMAL LOW (ref 13.0–17.0)
MCH: 29.2 pg (ref 26.0–34.0)
MCHC: 34.1 g/dL (ref 30.0–36.0)
MCV: 85.8 fL (ref 78.0–100.0)
PLATELETS: 178 10*3/uL (ref 150–400)
RBC: 3.66 MIL/uL — ABNORMAL LOW (ref 4.22–5.81)
RDW: 13.2 % (ref 11.5–15.5)
WBC: 7.1 10*3/uL (ref 4.0–10.5)

## 2013-03-06 LAB — MAGNESIUM: MAGNESIUM: 1.5 mg/dL (ref 1.5–2.5)

## 2013-03-06 LAB — LIPID PANEL
CHOL/HDL RATIO: 2.5 ratio
CHOLESTEROL: 152 mg/dL (ref 0–200)
HDL: 60 mg/dL (ref >39)
LDL Cholesterol: 68 mg/dL (ref 0–99)
TRIGLYCERIDES: 121 mg/dL (ref <150)
VLDL: 24 mg/dL (ref 0–40)

## 2013-03-06 LAB — FERRITIN: FERRITIN: 622 ng/mL — AB (ref 22–322)

## 2013-03-06 LAB — PHOSPHORUS: Phosphorus: 2.7 mg/dL (ref 2.3–4.6)

## 2013-03-06 MED ORDER — EPOETIN ALFA 20000 UNIT/ML IJ SOLN
INTRAMUSCULAR | Status: AC
  Filled 2013-03-06: qty 1

## 2013-03-06 MED ORDER — EPOETIN ALFA 10000 UNIT/ML IJ SOLN
INTRAMUSCULAR | Status: AC
  Filled 2013-03-06: qty 1

## 2013-03-06 MED ORDER — EPOETIN ALFA 10000 UNIT/ML IJ SOLN
30000.0000 [IU] | INTRAMUSCULAR | Status: DC
  Administered 2013-03-06: 30000 [IU] via SUBCUTANEOUS

## 2013-03-07 LAB — URINE CULTURE
Colony Count: NO GROWTH
Culture: NO GROWTH

## 2013-03-09 LAB — PTH, INTACT AND CALCIUM
Calcium, Total (PTH): 8.7 mg/dL (ref 8.4–10.5)
PTH: 85.5 pg/mL — AB (ref 14.0–72.0)

## 2013-03-09 MED FILL — Epoetin Alfa Inj 10000 Unit/ML: INTRAMUSCULAR | Qty: 1 | Status: AC

## 2013-03-09 MED FILL — Epoetin Alfa Inj 20000 Unit/ML: INTRAMUSCULAR | Qty: 1 | Status: AC

## 2013-03-27 ENCOUNTER — Encounter (HOSPITAL_COMMUNITY)
Admission: RE | Admit: 2013-03-27 | Discharge: 2013-03-27 | Disposition: A | Discharge status: Home / Self Care | Payer: BC Managed Care – PPO | Source: Ambulatory Visit | Attending: Nephrology | Admitting: Nephrology

## 2013-03-27 LAB — POCT HEMOGLOBIN-HEMACUE: Hemoglobin: 10 g/dL — ABNORMAL LOW (ref 13.0–17.0)

## 2013-03-27 MED ORDER — EPOETIN ALFA 10000 UNIT/ML IJ SOLN: 30000.0000 [IU] | INTRAMUSCULAR | Status: DC

## 2013-03-27 MED ORDER — EPOETIN ALFA 10000 UNIT/ML IJ SOLN
INTRAMUSCULAR | Status: AC
  Administered 2013-03-27: 10000 [IU] via SUBCUTANEOUS
  Filled 2013-03-27: qty 1

## 2013-03-27 MED ORDER — EPOETIN ALFA 20000 UNIT/ML IJ SOLN
INTRAMUSCULAR | Status: AC
  Administered 2013-03-27: 20000 [IU] via SUBCUTANEOUS
  Filled 2013-03-27: qty 1

## 2013-03-28 LAB — TACROLIMUS LEVEL: Tacrolimus (FK506) - LabCorp: 4.2 ng/mL

## 2013-04-17 ENCOUNTER — Encounter (HOSPITAL_COMMUNITY)
Admission: RE | Admit: 2013-04-17 | Discharge: 2013-04-17 | Disposition: A | Discharge status: Home / Self Care | Payer: BC Managed Care – PPO | Source: Ambulatory Visit | Attending: Nephrology | Admitting: Nephrology

## 2013-04-17 DIAGNOSIS — D638 Anemia in other chronic diseases classified elsewhere: Secondary | ICD-10-CM | POA: Insufficient documentation

## 2013-04-17 DIAGNOSIS — N183 Chronic kidney disease, stage 3 unspecified: Secondary | ICD-10-CM | POA: Insufficient documentation

## 2013-04-17 MED ORDER — EPOETIN ALFA 10000 UNIT/ML IJ SOLN: 30000.0000 [IU] | INTRAMUSCULAR | Status: DC

## 2013-04-17 MED ORDER — EPOETIN ALFA 10000 UNIT/ML IJ SOLN
INTRAMUSCULAR | Status: AC
  Administered 2013-04-17: 10000 [IU] via SUBCUTANEOUS
  Filled 2013-04-17: qty 1

## 2013-04-17 MED ORDER — EPOETIN ALFA 20000 UNIT/ML IJ SOLN
INTRAMUSCULAR | Status: AC
  Administered 2013-04-17: 20000 [IU] via SUBCUTANEOUS
  Filled 2013-04-17: qty 1

## 2013-04-20 LAB — POCT HEMOGLOBIN-HEMACUE: Hemoglobin: 10.9 g/dL — ABNORMAL LOW (ref 13.0–17.0)

## 2013-05-08 ENCOUNTER — Inpatient Hospital Stay (HOSPITAL_COMMUNITY): Admission: RE | Admit: 2013-05-08 | Payer: BC Managed Care – PPO | Source: Ambulatory Visit

## 2013-05-13 ENCOUNTER — Emergency Department (HOSPITAL_COMMUNITY)
Admission: EM | Admit: 2013-05-13 | Discharge: 2013-05-13 | Disposition: A | Discharge status: Home / Self Care | Payer: BC Managed Care – PPO | Source: Home / Self Care

## 2013-05-13 ENCOUNTER — Encounter (HOSPITAL_COMMUNITY): Payer: Self-pay | Admitting: Emergency Medicine

## 2013-05-13 ENCOUNTER — Emergency Department (INDEPENDENT_AMBULATORY_CARE_PROVIDER_SITE_OTHER): Payer: BC Managed Care – PPO

## 2013-05-13 DIAGNOSIS — B349 Viral infection, unspecified: Secondary | ICD-10-CM

## 2013-05-13 DIAGNOSIS — B9789 Other viral agents as the cause of diseases classified elsewhere: Secondary | ICD-10-CM

## 2013-05-13 DIAGNOSIS — J069 Acute upper respiratory infection, unspecified: Secondary | ICD-10-CM

## 2013-05-13 HISTORY — DX: Essential (primary) hypertension: I10

## 2013-05-13 HISTORY — DX: Disorder of kidney and ureter, unspecified: N28.9

## 2013-05-13 LAB — POCT I-STAT, CHEM 8
BUN: 44 mg/dL — ABNORMAL HIGH (ref 6–23)
CALCIUM ION: 1.2 mmol/L (ref 1.13–1.30)
CREATININE: 4.1 mg/dL — AB (ref 0.50–1.35)
Chloride: 107 mEq/L (ref 96–112)
Glucose, Bld: 95 mg/dL (ref 70–99)
HEMATOCRIT: 32 % — AB (ref 39.0–52.0)
HEMOGLOBIN: 10.9 g/dL — AB (ref 13.0–17.0)
Potassium: 5.2 mEq/L (ref 3.7–5.3)
Sodium: 139 mEq/L (ref 137–147)
TCO2: 22 mmol/L (ref 0–100)

## 2013-05-13 LAB — CBC WITH DIFFERENTIAL/PLATELET
BASOS PCT: 1 % (ref 0–1)
Basophils Absolute: 0 10*3/uL (ref 0.0–0.1)
EOS PCT: 1 % (ref 0–5)
Eosinophils Absolute: 0.1 10*3/uL (ref 0.0–0.7)
HCT: 29 % — ABNORMAL LOW (ref 39.0–52.0)
Hemoglobin: 10.1 g/dL — ABNORMAL LOW (ref 13.0–17.0)
LYMPHS ABS: 2.3 10*3/uL (ref 0.7–4.0)
Lymphocytes Relative: 39 % (ref 12–46)
MCH: 29.7 pg (ref 26.0–34.0)
MCHC: 34.8 g/dL (ref 30.0–36.0)
MCV: 85.3 fL (ref 78.0–100.0)
MONO ABS: 0.6 10*3/uL (ref 0.1–1.0)
MONOS PCT: 10 % (ref 3–12)
Neutro Abs: 2.9 10*3/uL (ref 1.7–7.7)
Neutrophils Relative %: 50 % (ref 43–77)
Platelets: 157 10*3/uL (ref 150–400)
RBC: 3.4 MIL/uL — ABNORMAL LOW (ref 4.22–5.81)
RDW: 13.7 % (ref 11.5–15.5)
WBC: 5.9 10*3/uL (ref 4.0–10.5)

## 2013-05-13 NOTE — Discharge Instructions (Signed)
Upper Respiratory Infection, Adult An upper respiratory infection (URI) is also known as the common cold. It is often caused by a type of germ (virus). Colds are easily spread (contagious). You can pass it to others by kissing, coughing, sneezing, or drinking out of the same glass. Usually, you get better in 1 or 2 weeks.  HOME CARE   Only take medicine as told by your doctor.  Use a warm mist humidifier or breathe in steam from a hot shower.  Drink enough water and fluids to keep your pee (urine) clear or pale yellow.  Get plenty of rest.  Return to work when your temperature is back to normal or as told by your doctor. You may use a face mask and wash your hands to stop your cold from spreading. GET HELP RIGHT AWAY IF:   After the first few days, you feel you are getting worse.  You have questions about your medicine.  You have chills, shortness of breath, or brown or red spit (mucus).  You have yellow or brown snot (nasal discharge) or pain in the face, especially when you bend forward.  You have a fever, puffy (swollen) neck, pain when you swallow, or white spots in the back of your throat.  You have a bad headache, ear pain, sinus pain, or chest pain.  You have a high-pitched whistling sound when you breathe in and out (wheezing).  You have a lasting cough or cough up blood.  You have sore muscles or a stiff neck. MAKE SURE YOU:   Understand these instructions.  Will watch your condition.  Will get help right away if you are not doing well or get worse. Document Released: 08/08/2007 Document Revised: 05/14/2011 Document Reviewed: 06/26/2010 [facility] Patient Information 2014 [place], [place].  Viral Infections A virus is a type of germ. Viruses can cause:  Minor sore throats.  Aches and pains.  Headaches.  Runny nose.  Rashes.  Watery eyes.  Tiredness.  Coughs.  Loss of appetite.  Feeling sick to your stomach (nausea).  Throwing up  (vomiting).  Watery poop (diarrhea). HOME CARE   Only take medicines as told by your doctor.  Drink enough water and fluids to keep your pee (urine) clear or pale yellow. Sports drinks are a good choice.  Get plenty of rest and eat healthy. Soups and broths with crackers or rice are fine. GET HELP RIGHT AWAY IF:   You have a very bad headache.  You have shortness of breath.  You have chest pain or neck pain.  You have an unusual rash.  You cannot stop throwing up.  You have watery poop that does not stop.  You cannot keep fluids down.  You or your child has a temperature by mouth above 102 F (38.9 C), not controlled by medicine.  Your baby is older than 3 months with a rectal temperature of 102 F (38.9 C) or higher.  Your baby is 57 months old or younger with a rectal temperature of 100.4 F (38 C) or higher. MAKE SURE YOU:   Understand these instructions.  Will watch this condition.  Will get help right away if you are not doing well or get worse. Document Released: 02/02/2008 Document Revised: 05/14/2011 Document Reviewed: 06/27/2010 [facility] Patient Information 2014 [place], [place].

## 2013-05-13 NOTE — ED Provider Notes (Signed)
Medical screening examination/treatment/procedure(s) were performed by a resident physician or non-physician practitioner and as the supervising physician I was immediately available for consultation/collaboration.  Lynne Leader, MD   Gregor Hams, MD 05/13/13 2100

## 2013-05-13 NOTE — ED Notes (Signed)
Pt  Reports  Symptoms  Of  Cough   /  Congested   With hoarseness     decreased  Appetite      For  sev  Weeks           Pt  Reports  Symptoms  Were  Better    intially  And  Then  Returned

## 2013-05-13 NOTE — ED Provider Notes (Signed)
CSN: [id number]     Arrival date & time 05/13/13  1005 History   First MD Initiated Contact with Patient 05/13/13 1213     Chief Complaint  Patient presents with  . URI   (Consider location/radiation/quality/duration/timing/severity/associated sxs/prior Treatment) HPI Comments: 68 year old male appearing 18 years older than stated age is accompanied by his wife with complaints of "cold" for a week. He is complaining of chills, PND, nasal congestion, cough, and decreased appetite. Denies earache, sore throat, GI symptoms. He is a kidney transplant patient. He states the symptoms lasted for approximately 4 days until 2 days ago and was feeling better. This morning the symptoms became worse he became significantly weak and on his way to work around and came back home. He apparently does not miss much work.   Past Medical History  Diagnosis Date  . Renal disorder   . Hypertension    Past Surgical History  Procedure Laterality Date  . Nephrectomy transplanted organ     No family history on file. History  Substance Use Topics  . Smoking status: Never Smoker   . Smokeless tobacco: Not on file  . Alcohol Use: No    Review of Systems  Constitutional: Positive for fever and activity change. Negative for diaphoresis and fatigue.  HENT: Positive for postnasal drip, rhinorrhea and sore throat. Negative for ear pain, facial swelling and trouble swallowing.   Eyes: Negative for pain, discharge and redness.  Respiratory: Positive for cough. Negative for chest tightness and shortness of breath.   Cardiovascular: Negative.  Negative for chest pain and leg swelling.  Gastrointestinal: Negative.   Genitourinary: Negative.   Musculoskeletal: Negative.  Negative for neck pain and neck stiffness.  Skin: Negative for rash.  Neurological: Negative.  Negative for dizziness, syncope, facial asymmetry and speech difficulty.    Allergies  Review of patient's allergies indicates no known  allergies.  Home Medications   Current Outpatient Rx  Name  Route  Sig  Dispense  Refill  . AmLODIPine Besylate (NORVASC PO)   Oral   Take by mouth.         . IRON PO   Oral   Take by mouth.         Marland Kitchen PREDNISONE PO   Oral   Take by mouth.         . Sulfamethoxazole-Trimethoprim (BACTRIM PO)   Oral   Take by mouth.          BP 131/59  Pulse 76  Temp(Src) 98.4 F (36.9 C) (Oral)  Resp 16  SpO2 100% Physical Exam  Nursing note and vitals reviewed. Constitutional: He is oriented to person, place, and time. He appears well-developed and well-nourished. No distress.  Appears mildly ill.  HENT:  Mouth/Throat: No oropharyngeal exudate.  Bilateral TMs are normal Oropharynx with PND. There is a reddish brown fluid cutting a portion of the tongue. This is associated with strong halitosis and erythema of the gums with evidence of bleeding.  Eyes: Conjunctivae and EOM are normal.  Neck: Normal range of motion. Neck supple.  Cardiovascular: Normal rate.   Murmur heard. Heart sounds are normal. The sounds appeared to be S1 with holosystolic murmur. The patient states that this sound is coming from an old fistula in the left arm.  Pulmonary/Chest: Effort normal. No respiratory distress. He has no wheezes. He has no rales.  Abdominal: Soft. He exhibits no distension. There is no tenderness. There is no rebound and no guarding.  Musculoskeletal: He exhibits no edema  and no tenderness.  Lymphadenopathy:    He has no cervical adenopathy.  Neurological: He is alert and oriented to person, place, and time.  Skin: Skin is warm and dry.  Psychiatric: He has a normal mood and affect.    ED Course  Procedures (including critical care time) Labs Review Labs Reviewed  CBC WITH DIFFERENTIAL - Abnormal; Notable for the following:    RBC 3.40 (*)    Hemoglobin 10.1 (*)    HCT 29.0 (*)    All other components within normal limits  POCT I-STAT, CHEM 8 - Abnormal; Notable for the  following:    BUN 44 (*)    Creatinine, Ser 4.10 (*)    Hemoglobin 10.9 (*)    HCT 32.0 (*)    All other components within normal limits   Imaging Review Dg Chest 2 View  05/13/2013   CLINICAL DATA Cough and fever  EXAM CHEST  2 VIEW  COMPARISON None.  FINDINGS COPD. Lungs are clear without infiltrate or effusion. No mass or heart failure.  IMPRESSION No active cardiopulmonary disease.  SIGNATURE  Electronically Signed   By: Franchot Gallo M.D.   On: 05/13/2013 13:07     MDM   1. Viral syndrome   2. URI (upper respiratory infection)    Obtain the Sudafed combination medication recommended by your doctor [place] of fluids Tylenol if needed     Janne Napoleon, NP 05/13/13 1423

## 2013-05-15 ENCOUNTER — Encounter (HOSPITAL_COMMUNITY)
Admission: RE | Admit: 2013-05-15 | Discharge: 2013-05-15 | Disposition: A | Discharge status: Home / Self Care | Payer: BC Managed Care – PPO | Source: Ambulatory Visit | Attending: Nephrology | Admitting: Nephrology

## 2013-05-15 DIAGNOSIS — N183 Chronic kidney disease, stage 3 unspecified: Secondary | ICD-10-CM | POA: Insufficient documentation

## 2013-05-15 DIAGNOSIS — D638 Anemia in other chronic diseases classified elsewhere: Secondary | ICD-10-CM | POA: Insufficient documentation

## 2013-05-15 LAB — POCT HEMOGLOBIN-HEMACUE: Hemoglobin: 8.7 g/dL — ABNORMAL LOW (ref 13.0–17.0)

## 2013-05-15 MED ORDER — EPOETIN ALFA 10000 UNIT/ML IJ SOLN
INTRAMUSCULAR | Status: AC
  Administered 2013-05-15: 10000 [IU] via SUBCUTANEOUS
  Filled 2013-05-15: qty 1

## 2013-05-15 MED ORDER — EPOETIN ALFA 10000 UNIT/ML IJ SOLN: 30000.0000 [IU] | INTRAMUSCULAR | Status: DC

## 2013-05-15 MED ORDER — EPOETIN ALFA 20000 UNIT/ML IJ SOLN
INTRAMUSCULAR | Status: AC
  Administered 2013-05-15: 20000 [IU] via SUBCUTANEOUS
  Filled 2013-05-15: qty 1

## 2013-06-04 ENCOUNTER — Encounter (HOSPITAL_COMMUNITY)
Admission: RE | Admit: 2013-06-04 | Discharge: 2013-06-04 | Disposition: A | Discharge status: Home / Self Care | Payer: BC Managed Care – PPO | Source: Ambulatory Visit | Attending: Nephrology | Admitting: Nephrology

## 2013-06-04 DIAGNOSIS — N183 Chronic kidney disease, stage 3 unspecified: Secondary | ICD-10-CM | POA: Insufficient documentation

## 2013-06-04 DIAGNOSIS — D638 Anemia in other chronic diseases classified elsewhere: Secondary | ICD-10-CM | POA: Insufficient documentation

## 2013-06-04 LAB — COMPREHENSIVE METABOLIC PANEL
ALT: 8 U/L (ref 0–53)
AST: 18 U/L (ref 0–37)
Albumin: 3.2 g/dL — ABNORMAL LOW (ref 3.5–5.2)
Alkaline Phosphatase: 76 U/L (ref 39–117)
BILIRUBIN TOTAL: 0.4 mg/dL (ref 0.3–1.2)
BUN: 37 mg/dL — ABNORMAL HIGH (ref 6–23)
CHLORIDE: 105 meq/L (ref 96–112)
CO2: 20 meq/L (ref 19–32)
Calcium: 8.9 mg/dL (ref 8.4–10.5)
Creatinine, Ser: 2.66 mg/dL — ABNORMAL HIGH (ref 0.50–1.35)
GFR calc Af Amer: 27 mL/min — ABNORMAL LOW (ref >90)
GFR calc non Af Amer: 24 mL/min — ABNORMAL LOW (ref >90)
Glucose, Bld: 67 mg/dL — ABNORMAL LOW (ref 70–99)
Potassium: 4.1 mEq/L (ref 3.7–5.3)
Sodium: 138 mEq/L (ref 137–147)
Total Protein: 8.3 g/dL (ref 6.0–8.3)

## 2013-06-04 LAB — LIPID PANEL
Cholesterol: 135 mg/dL (ref 0–200)
HDL: 58 mg/dL (ref >39)
LDL Cholesterol: 36 mg/dL (ref 0–99)
Total CHOL/HDL Ratio: 2.3 RATIO
Triglycerides: 206 mg/dL — ABNORMAL HIGH (ref <150)
VLDL: 41 mg/dL — ABNORMAL HIGH (ref 0–40)

## 2013-06-04 LAB — CBC
HCT: 26.2 % — ABNORMAL LOW (ref 39.0–52.0)
Hemoglobin: 9.1 g/dL — ABNORMAL LOW (ref 13.0–17.0)
MCH: 29.5 pg (ref 26.0–34.0)
MCHC: 34.7 g/dL (ref 30.0–36.0)
MCV: 85.1 fL (ref 78.0–100.0)
PLATELETS: 197 10*3/uL (ref 150–400)
RBC: 3.08 MIL/uL — AB (ref 4.22–5.81)
RDW: 14.2 % (ref 11.5–15.5)
WBC: 7.3 10*3/uL (ref 4.0–10.5)

## 2013-06-04 LAB — PHOSPHORUS: Phosphorus: 2.9 mg/dL (ref 2.3–4.6)

## 2013-06-04 LAB — IRON AND TIBC
IRON: 76 ug/dL (ref 42–135)
Saturation Ratios: 46 % (ref 20–55)
TIBC: 166 ug/dL — ABNORMAL LOW (ref 215–435)
UIBC: 90 ug/dL — ABNORMAL LOW (ref 125–400)

## 2013-06-04 LAB — MAGNESIUM: Magnesium: 1.9 mg/dL (ref 1.5–2.5)

## 2013-06-04 LAB — FERRITIN: Ferritin: 680 ng/mL — ABNORMAL HIGH (ref 22–322)

## 2013-06-04 MED ORDER — EPOETIN ALFA 10000 UNIT/ML IJ SOLN
30000.0000 [IU] | INTRAMUSCULAR | Status: DC
  Administered 2013-06-04: 10000 [IU] via SUBCUTANEOUS

## 2013-06-04 MED ORDER — EPOETIN ALFA 10000 UNIT/ML IJ SOLN
INTRAMUSCULAR | Status: AC
  Administered 2013-06-04: 10000 [IU] via SUBCUTANEOUS
  Filled 2013-06-04: qty 1

## 2013-06-04 MED ORDER — EPOETIN ALFA 20000 UNIT/ML IJ SOLN
INTRAMUSCULAR | Status: AC
  Administered 2013-06-04: 20000 [IU]
  Filled 2013-06-04: qty 1

## 2013-06-05 LAB — PTH, INTACT AND CALCIUM
Calcium, Total (PTH): 8.5 mg/dL (ref 8.4–10.5)
PTH: 94 pg/mL — ABNORMAL HIGH (ref 14.0–72.0)

## 2013-06-06 LAB — TACROLIMUS LEVEL: Tacrolimus (FK506) - LabCorp: 7.1 ng/mL

## 2013-06-15 ENCOUNTER — Telehealth: Payer: Self-pay | Admitting: Family Medicine

## 2013-06-15 NOTE — Telephone Encounter (Signed)
ER letter sent 

## 2013-06-26 ENCOUNTER — Encounter (HOSPITAL_COMMUNITY)
Admission: RE | Admit: 2013-06-26 | Discharge: 2013-06-26 | Disposition: A | Discharge status: Home / Self Care | Payer: BC Managed Care – PPO | Source: Ambulatory Visit | Attending: Nephrology | Admitting: Nephrology

## 2013-06-26 LAB — POCT HEMOGLOBIN-HEMACUE: Hemoglobin: 9.8 g/dL — ABNORMAL LOW (ref 13.0–17.0)

## 2013-06-26 MED ORDER — EPOETIN ALFA 10000 UNIT/ML IJ SOLN
30000.0000 [IU] | INTRAMUSCULAR | Status: DC
  Administered 2013-06-26: 10000 [IU] via SUBCUTANEOUS

## 2013-06-26 MED ORDER — EPOETIN ALFA 20000 UNIT/ML IJ SOLN
INTRAMUSCULAR | Status: AC
Start: 2013-06-26 — End: 2013-06-26
  Administered 2013-06-26: 20000 [IU]
  Filled 2013-06-26: qty 1

## 2013-06-26 MED ORDER — EPOETIN ALFA 10000 UNIT/ML IJ SOLN
INTRAMUSCULAR | Status: AC
  Administered 2013-06-26: 10000 [IU] via SUBCUTANEOUS
  Filled 2013-06-26: qty 1

## 2013-07-17 ENCOUNTER — Encounter (HOSPITAL_COMMUNITY)
Admission: RE | Admit: 2013-07-17 | Discharge: 2013-07-17 | Disposition: A | Discharge status: Home / Self Care | Payer: BC Managed Care – PPO | Source: Ambulatory Visit | Attending: Nephrology | Admitting: Nephrology

## 2013-07-17 DIAGNOSIS — N183 Chronic kidney disease, stage 3 unspecified: Secondary | ICD-10-CM | POA: Insufficient documentation

## 2013-07-17 DIAGNOSIS — D638 Anemia in other chronic diseases classified elsewhere: Secondary | ICD-10-CM | POA: Insufficient documentation

## 2013-07-17 LAB — POCT HEMOGLOBIN-HEMACUE: Hemoglobin: 9.6 g/dL — ABNORMAL LOW (ref 13.0–17.0)

## 2013-07-17 MED ORDER — EPOETIN ALFA 20000 UNIT/ML IJ SOLN
INTRAMUSCULAR | Status: AC
  Administered 2013-07-17: 20000 [IU] via SUBCUTANEOUS
  Filled 2013-07-17: qty 1

## 2013-07-17 MED ORDER — EPOETIN ALFA 10000 UNIT/ML IJ SOLN
INTRAMUSCULAR | Status: AC
  Administered 2013-07-17: 10000 [IU] via SUBCUTANEOUS
  Filled 2013-07-17: qty 1

## 2013-07-17 MED ORDER — EPOETIN ALFA 10000 UNIT/ML IJ SOLN: 30000.0000 [IU] | INTRAMUSCULAR | Status: DC

## 2013-08-06 ENCOUNTER — Other Ambulatory Visit (HOSPITAL_COMMUNITY): Payer: Self-pay | Admitting: *Deleted

## 2013-08-07 ENCOUNTER — Encounter (HOSPITAL_COMMUNITY)
Admission: RE | Admit: 2013-08-07 | Discharge: 2013-08-07 | Disposition: A | Discharge status: Home / Self Care | Payer: BC Managed Care – PPO | Source: Ambulatory Visit | Attending: Nephrology | Admitting: Nephrology

## 2013-08-07 DIAGNOSIS — N183 Chronic kidney disease, stage 3 unspecified: Secondary | ICD-10-CM | POA: Insufficient documentation

## 2013-08-07 DIAGNOSIS — D638 Anemia in other chronic diseases classified elsewhere: Secondary | ICD-10-CM | POA: Insufficient documentation

## 2013-08-07 LAB — POCT HEMOGLOBIN-HEMACUE: HEMOGLOBIN: 9.4 g/dL — AB (ref 13.0–17.0)

## 2013-08-07 MED ORDER — EPOETIN ALFA 10000 UNIT/ML IJ SOLN
INTRAMUSCULAR | Status: AC
  Administered 2013-08-07: 10000 [IU] via SUBCUTANEOUS
  Filled 2013-08-07: qty 1

## 2013-08-07 MED ORDER — EPOETIN ALFA 10000 UNIT/ML IJ SOLN: 30000.0000 [IU] | INTRAMUSCULAR | Status: DC

## 2013-08-07 MED ORDER — EPOETIN ALFA 20000 UNIT/ML IJ SOLN
INTRAMUSCULAR | Status: AC
  Administered 2013-08-07: 20000 [IU] via SUBCUTANEOUS
  Filled 2013-08-07: qty 1

## 2013-08-28 ENCOUNTER — Encounter (HOSPITAL_COMMUNITY)
Admission: RE | Admit: 2013-08-28 | Discharge: 2013-08-28 | Disposition: A | Discharge status: Home / Self Care | Payer: BC Managed Care – PPO | Source: Ambulatory Visit | Attending: Nephrology | Admitting: Nephrology

## 2013-08-28 MED ORDER — EPOETIN ALFA 20000 UNIT/ML IJ SOLN
INTRAMUSCULAR | Status: AC
  Administered 2013-08-28: 20000 [IU]
  Filled 2013-08-28: qty 1

## 2013-08-28 MED ORDER — EPOETIN ALFA 10000 UNIT/ML IJ SOLN
30000.0000 [IU] | INTRAMUSCULAR | Status: DC
  Administered 2013-08-28: 10000 [IU] via SUBCUTANEOUS

## 2013-08-28 MED ORDER — EPOETIN ALFA 10000 UNIT/ML IJ SOLN
INTRAMUSCULAR | Status: AC
  Administered 2013-08-28: 10000 [IU] via SUBCUTANEOUS
  Filled 2013-08-28: qty 1

## 2013-08-31 LAB — POCT HEMOGLOBIN-HEMACUE: Hemoglobin: 9.5 g/dL — ABNORMAL LOW (ref 13.0–17.0)

## 2013-09-18 ENCOUNTER — Encounter (HOSPITAL_COMMUNITY)
Admission: RE | Admit: 2013-09-18 | Discharge: 2013-09-18 | Disposition: A | Discharge status: Home / Self Care | Payer: BC Managed Care – PPO | Source: Ambulatory Visit | Attending: Nephrology | Admitting: Nephrology

## 2013-09-18 DIAGNOSIS — N183 Chronic kidney disease, stage 3 unspecified: Secondary | ICD-10-CM | POA: Insufficient documentation

## 2013-09-18 DIAGNOSIS — D638 Anemia in other chronic diseases classified elsewhere: Secondary | ICD-10-CM | POA: Insufficient documentation

## 2013-09-18 LAB — CBC
HEMATOCRIT: 29.8 % — AB (ref 39.0–52.0)
Hemoglobin: 9.8 g/dL — ABNORMAL LOW (ref 13.0–17.0)
MCH: 28.7 pg (ref 26.0–34.0)
MCHC: 32.9 g/dL (ref 30.0–36.0)
MCV: 87.1 fL (ref 78.0–100.0)
Platelets: 242 10*3/uL (ref 150–400)
RBC: 3.42 MIL/uL — ABNORMAL LOW (ref 4.22–5.81)
RDW: 14.1 % (ref 11.5–15.5)
WBC: 7.2 10*3/uL (ref 4.0–10.5)

## 2013-09-18 LAB — COMPREHENSIVE METABOLIC PANEL
ALT: 10 U/L (ref 0–53)
AST: 23 U/L (ref 0–37)
Albumin: 3.5 g/dL (ref 3.5–5.2)
Alkaline Phosphatase: 79 U/L (ref 39–117)
Anion gap: 14 (ref 5–15)
BUN: 32 mg/dL — AB (ref 6–23)
CALCIUM: 8.9 mg/dL (ref 8.4–10.5)
CO2: 21 mEq/L (ref 19–32)
Chloride: 103 mEq/L (ref 96–112)
Creatinine, Ser: 2.63 mg/dL — ABNORMAL HIGH (ref 0.50–1.35)
GFR calc non Af Amer: 24 mL/min — ABNORMAL LOW (ref >90)
GFR, EST AFRICAN AMERICAN: 28 mL/min — AB (ref >90)
GLUCOSE: 106 mg/dL — AB (ref 70–99)
Potassium: 4.8 mEq/L (ref 3.7–5.3)
SODIUM: 138 meq/L (ref 137–147)
TOTAL PROTEIN: 9.3 g/dL — AB (ref 6.0–8.3)
Total Bilirubin: 0.3 mg/dL (ref 0.3–1.2)

## 2013-09-18 LAB — PHOSPHORUS: PHOSPHORUS: 2.6 mg/dL (ref 2.3–4.6)

## 2013-09-18 LAB — FERRITIN: Ferritin: 689 ng/mL — ABNORMAL HIGH (ref 22–322)

## 2013-09-18 LAB — IRON AND TIBC
Iron: 58 ug/dL (ref 42–135)
SATURATION RATIOS: 33 % (ref 20–55)
TIBC: 178 ug/dL — AB (ref 215–435)
UIBC: 120 ug/dL — ABNORMAL LOW (ref 125–400)

## 2013-09-18 LAB — MAGNESIUM: Magnesium: 1.5 mg/dL (ref 1.5–2.5)

## 2013-09-18 MED ORDER — EPOETIN ALFA 20000 UNIT/ML IJ SOLN
INTRAMUSCULAR | Status: AC
  Administered 2013-09-18: 20000 [IU] via SUBCUTANEOUS
  Filled 2013-09-18: qty 1

## 2013-09-18 MED ORDER — EPOETIN ALFA 10000 UNIT/ML IJ SOLN
INTRAMUSCULAR | Status: AC
  Administered 2013-09-18: 10000 [IU] via SUBCUTANEOUS
  Filled 2013-09-18: qty 1

## 2013-09-18 MED ORDER — EPOETIN ALFA 10000 UNIT/ML IJ SOLN: 30000.0000 [IU] | INTRAMUSCULAR | Status: DC

## 2013-09-21 LAB — PTH, INTACT AND CALCIUM
Calcium, Total (PTH): 8.9 mg/dL (ref 8.4–10.5)
PTH: 76.3 pg/mL — AB (ref 14.0–72.0)

## 2013-10-09 ENCOUNTER — Encounter (HOSPITAL_COMMUNITY): Payer: BC Managed Care – PPO

## 2013-10-16 ENCOUNTER — Encounter (HOSPITAL_COMMUNITY)
Admission: RE | Admit: 2013-10-16 | Discharge: 2013-10-16 | Disposition: A | Discharge status: Home / Self Care | Payer: BC Managed Care – PPO | Source: Ambulatory Visit | Attending: Nephrology | Admitting: Nephrology

## 2013-10-16 DIAGNOSIS — N183 Chronic kidney disease, stage 3 unspecified: Secondary | ICD-10-CM | POA: Diagnosis not present

## 2013-10-16 DIAGNOSIS — D638 Anemia in other chronic diseases classified elsewhere: Secondary | ICD-10-CM | POA: Diagnosis not present

## 2013-10-16 LAB — POCT HEMOGLOBIN-HEMACUE: Hemoglobin: 9 g/dL — ABNORMAL LOW (ref 13.0–17.0)

## 2013-10-16 MED ORDER — EPOETIN ALFA 10000 UNIT/ML IJ SOLN
30000.0000 [IU] | INTRAMUSCULAR | Status: DC
  Administered 2013-10-16: 10000 [IU] via SUBCUTANEOUS

## 2013-10-16 MED ORDER — EPOETIN ALFA 10000 UNIT/ML IJ SOLN
INTRAMUSCULAR | Status: AC
  Filled 2013-10-16: qty 1

## 2013-10-16 MED ORDER — EPOETIN ALFA 20000 UNIT/ML IJ SOLN
INTRAMUSCULAR | Status: AC
  Administered 2013-10-16: 20000 [IU]
  Filled 2013-10-16: qty 1

## 2013-10-20 LAB — POCT HEMOGLOBIN-HEMACUE: Hemoglobin: 9.9 g/dL — ABNORMAL LOW (ref 13.0–17.0)

## 2013-11-05 ENCOUNTER — Other Ambulatory Visit (HOSPITAL_COMMUNITY): Payer: Self-pay | Admitting: *Deleted

## 2013-11-06 ENCOUNTER — Encounter (HOSPITAL_COMMUNITY)
Admission: RE | Admit: 2013-11-06 | Discharge: 2013-11-06 | Disposition: A | Discharge status: Home / Self Care | Payer: BC Managed Care – PPO | Source: Ambulatory Visit | Attending: Nephrology | Admitting: Nephrology

## 2013-11-06 DIAGNOSIS — N183 Chronic kidney disease, stage 3 unspecified: Secondary | ICD-10-CM | POA: Diagnosis not present

## 2013-11-06 DIAGNOSIS — D638 Anemia in other chronic diseases classified elsewhere: Secondary | ICD-10-CM | POA: Insufficient documentation

## 2013-11-06 LAB — POCT HEMOGLOBIN-HEMACUE: Hemoglobin: 9.6 g/dL — ABNORMAL LOW (ref 13.0–17.0)

## 2013-11-06 MED ORDER — EPOETIN ALFA 20000 UNIT/ML IJ SOLN
INTRAMUSCULAR | Status: AC
  Administered 2013-11-06: 20000 [IU] via SUBCUTANEOUS
  Filled 2013-11-06: qty 1

## 2013-11-06 MED ORDER — EPOETIN ALFA 10000 UNIT/ML IJ SOLN
30000.0000 [IU] | INTRAMUSCULAR | Status: DC
  Administered 2013-11-06: 10000 [IU] via SUBCUTANEOUS

## 2013-11-06 MED ORDER — EPOETIN ALFA 10000 UNIT/ML IJ SOLN
INTRAMUSCULAR | Status: AC
  Administered 2013-11-06: 10000 [IU] via SUBCUTANEOUS
  Filled 2013-11-06: qty 1

## 2013-11-27 ENCOUNTER — Encounter (HOSPITAL_COMMUNITY)
Admission: RE | Admit: 2013-11-27 | Discharge: 2013-11-27 | Disposition: A | Discharge status: Home / Self Care | Payer: BC Managed Care – PPO | Source: Ambulatory Visit | Attending: Nephrology | Admitting: Nephrology

## 2013-11-27 DIAGNOSIS — D638 Anemia in other chronic diseases classified elsewhere: Secondary | ICD-10-CM | POA: Diagnosis not present

## 2013-11-27 LAB — POCT HEMOGLOBIN-HEMACUE: Hemoglobin: 9.9 g/dL — ABNORMAL LOW (ref 13.0–17.0)

## 2013-11-27 MED ORDER — EPOETIN ALFA 10000 UNIT/ML IJ SOLN
INTRAMUSCULAR | Status: AC
  Administered 2013-11-27: 10000 [IU] via SUBCUTANEOUS
  Filled 2013-11-27: qty 1

## 2013-11-27 MED ORDER — EPOETIN ALFA 20000 UNIT/ML IJ SOLN
INTRAMUSCULAR | Status: AC
  Administered 2013-11-27: 20000 [IU] via SUBCUTANEOUS
  Filled 2013-11-27: qty 1

## 2013-11-27 MED ORDER — EPOETIN ALFA 10000 UNIT/ML IJ SOLN: 30000.0000 [IU] | INTRAMUSCULAR | Status: DC

## 2013-12-09 DIAGNOSIS — N2581 Secondary hyperparathyroidism of renal origin: Secondary | ICD-10-CM | POA: Diagnosis not present

## 2013-12-09 DIAGNOSIS — Z940 Kidney transplant status: Secondary | ICD-10-CM | POA: Diagnosis not present

## 2013-12-09 DIAGNOSIS — I129 Hypertensive chronic kidney disease with stage 1 through stage 4 chronic kidney disease, or unspecified chronic kidney disease: Secondary | ICD-10-CM | POA: Diagnosis not present

## 2013-12-09 DIAGNOSIS — D631 Anemia in chronic kidney disease: Secondary | ICD-10-CM | POA: Diagnosis not present

## 2013-12-18 ENCOUNTER — Encounter (HOSPITAL_COMMUNITY): Payer: BC Managed Care – PPO

## 2013-12-29 ENCOUNTER — Encounter (HOSPITAL_COMMUNITY)
Admission: RE | Admit: 2013-12-29 | Discharge: 2013-12-29 | Disposition: A | Discharge status: Home / Self Care | Payer: Medicare Other | Source: Ambulatory Visit | Attending: Nephrology | Admitting: Nephrology

## 2013-12-29 DIAGNOSIS — D631 Anemia in chronic kidney disease: Secondary | ICD-10-CM | POA: Diagnosis not present

## 2013-12-29 DIAGNOSIS — N183 Chronic kidney disease, stage 3 (moderate): Secondary | ICD-10-CM | POA: Diagnosis not present

## 2013-12-29 DIAGNOSIS — N2581 Secondary hyperparathyroidism of renal origin: Secondary | ICD-10-CM | POA: Diagnosis not present

## 2013-12-29 DIAGNOSIS — Z940 Kidney transplant status: Secondary | ICD-10-CM | POA: Diagnosis not present

## 2013-12-29 LAB — URINALYSIS, ROUTINE W REFLEX MICROSCOPIC
Bilirubin Urine: NEGATIVE
GLUCOSE, UA: NEGATIVE mg/dL
Hgb urine dipstick: NEGATIVE
Ketones, ur: NEGATIVE mg/dL
Leukocytes, UA: NEGATIVE
Nitrite: NEGATIVE
PH: 5.5 (ref 5.0–8.0)
Protein, ur: NEGATIVE mg/dL
Specific Gravity, Urine: 1.013 (ref 1.005–1.030)
Urobilinogen, UA: 1 mg/dL (ref 0.0–1.0)

## 2013-12-29 LAB — COMPREHENSIVE METABOLIC PANEL
ALBUMIN: 3.4 g/dL — AB (ref 3.5–5.2)
ALT: 10 U/L (ref 0–53)
ANION GAP: 11 (ref 5–15)
AST: 21 U/L (ref 0–37)
Alkaline Phosphatase: 79 U/L (ref 39–117)
BUN: 33 mg/dL — AB (ref 6–23)
CALCIUM: 9.3 mg/dL (ref 8.4–10.5)
CO2: 22 mEq/L (ref 19–32)
Chloride: 107 mEq/L (ref 96–112)
Creatinine, Ser: 2.63 mg/dL — ABNORMAL HIGH (ref 0.50–1.35)
GFR calc non Af Amer: 24 mL/min — ABNORMAL LOW (ref >90)
GFR, EST AFRICAN AMERICAN: 28 mL/min — AB (ref >90)
GLUCOSE: 103 mg/dL — AB (ref 70–99)
Potassium: 5.3 mEq/L (ref 3.7–5.3)
Sodium: 140 mEq/L (ref 137–147)
TOTAL PROTEIN: 9.6 g/dL — AB (ref 6.0–8.3)
Total Bilirubin: 0.4 mg/dL (ref 0.3–1.2)

## 2013-12-29 LAB — MAGNESIUM: MAGNESIUM: 1.6 mg/dL (ref 1.5–2.5)

## 2013-12-29 LAB — IRON AND TIBC
Iron: 84 ug/dL (ref 42–135)
SATURATION RATIOS: 45 % (ref 20–55)
TIBC: 186 ug/dL — AB (ref 215–435)
UIBC: 102 ug/dL — ABNORMAL LOW (ref 125–400)

## 2013-12-29 LAB — CBC
HCT: 30.3 % — ABNORMAL LOW (ref 39.0–52.0)
HEMOGLOBIN: 10.1 g/dL — AB (ref 13.0–17.0)
MCH: 28.8 pg (ref 26.0–34.0)
MCHC: 33.3 g/dL (ref 30.0–36.0)
MCV: 86.3 fL (ref 78.0–100.0)
Platelets: 203 10*3/uL (ref 150–400)
RBC: 3.51 MIL/uL — ABNORMAL LOW (ref 4.22–5.81)
RDW: 13.6 % (ref 11.5–15.5)
WBC: 6 10*3/uL (ref 4.0–10.5)

## 2013-12-29 LAB — LIPID PANEL
Cholesterol: 165 mg/dL (ref 0–200)
HDL: 61 mg/dL (ref >39)
LDL Cholesterol: 78 mg/dL (ref 0–99)
Total CHOL/HDL Ratio: 2.7 RATIO
Triglycerides: 129 mg/dL (ref <150)
VLDL: 26 mg/dL (ref 0–40)

## 2013-12-29 LAB — FERRITIN: Ferritin: 658 ng/mL — ABNORMAL HIGH (ref 22–322)

## 2013-12-29 LAB — PHOSPHORUS: Phosphorus: 3.8 mg/dL (ref 2.3–4.6)

## 2013-12-29 MED ORDER — EPOETIN ALFA 10000 UNIT/ML IJ SOLN: 30000.0000 [IU] | INTRAMUSCULAR | Status: DC

## 2013-12-29 MED ORDER — EPOETIN ALFA 10000 UNIT/ML IJ SOLN
INTRAMUSCULAR | Status: AC
  Administered 2013-12-29: 10000 [IU] via SUBCUTANEOUS
  Filled 2013-12-29: qty 1

## 2013-12-29 MED ORDER — EPOETIN ALFA 20000 UNIT/ML IJ SOLN
INTRAMUSCULAR | Status: AC
  Administered 2013-12-29: 20000 [IU] via SUBCUTANEOUS
  Filled 2013-12-29: qty 1

## 2013-12-30 LAB — PTH, INTACT AND CALCIUM
Calcium, Total (PTH): 9.2 mg/dL (ref 8.4–10.5)
PTH: 55 pg/mL (ref 14–64)

## 2014-01-01 LAB — TACROLIMUS LEVEL: TACROLIMUS (FK506) - LABCORP: 12.3 ng/mL

## 2014-01-07 DIAGNOSIS — Z940 Kidney transplant status: Secondary | ICD-10-CM | POA: Diagnosis not present

## 2014-01-22 ENCOUNTER — Encounter (HOSPITAL_COMMUNITY)
Admission: RE | Admit: 2014-01-22 | Discharge: 2014-01-22 | Disposition: A | Discharge status: Home / Self Care | Payer: Medicare Other | Source: Ambulatory Visit | Attending: Nephrology | Admitting: Nephrology

## 2014-01-22 DIAGNOSIS — N2581 Secondary hyperparathyroidism of renal origin: Secondary | ICD-10-CM | POA: Diagnosis not present

## 2014-01-22 DIAGNOSIS — D631 Anemia in chronic kidney disease: Secondary | ICD-10-CM | POA: Diagnosis not present

## 2014-01-22 DIAGNOSIS — N183 Chronic kidney disease, stage 3 (moderate): Secondary | ICD-10-CM | POA: Insufficient documentation

## 2014-01-22 DIAGNOSIS — Z940 Kidney transplant status: Secondary | ICD-10-CM | POA: Diagnosis not present

## 2014-01-22 LAB — POCT HEMOGLOBIN-HEMACUE: Hemoglobin: 8.9 g/dL — ABNORMAL LOW (ref 13.0–17.0)

## 2014-01-22 LAB — IRON AND TIBC
IRON: 99 ug/dL (ref 42–135)
SATURATION RATIOS: 61 % — AB (ref 20–55)
TIBC: 162 ug/dL — ABNORMAL LOW (ref 215–435)
UIBC: 63 ug/dL — AB (ref 125–400)

## 2014-01-22 LAB — FERRITIN: Ferritin: 626 ng/mL — ABNORMAL HIGH (ref 22–322)

## 2014-01-22 MED ORDER — EPOETIN ALFA 20000 UNIT/ML IJ SOLN
INTRAMUSCULAR | Status: AC
  Administered 2014-01-22: 20000 [IU] via SUBCUTANEOUS
  Filled 2014-01-22: qty 1

## 2014-01-22 MED ORDER — EPOETIN ALFA 10000 UNIT/ML IJ SOLN
INTRAMUSCULAR | Status: AC
  Administered 2014-01-22: 10000 [IU] via SUBCUTANEOUS
  Filled 2014-01-22: qty 1

## 2014-01-22 MED ORDER — EPOETIN ALFA 10000 UNIT/ML IJ SOLN: 30000.0000 [IU] | INTRAMUSCULAR | Status: DC

## 2014-02-12 ENCOUNTER — Encounter (HOSPITAL_COMMUNITY)
Admission: RE | Admit: 2014-02-12 | Discharge: 2014-02-12 | Disposition: A | Discharge status: Home / Self Care | Payer: Medicare Other | Source: Ambulatory Visit | Attending: Nephrology | Admitting: Nephrology

## 2014-02-12 DIAGNOSIS — D631 Anemia in chronic kidney disease: Secondary | ICD-10-CM | POA: Insufficient documentation

## 2014-02-12 DIAGNOSIS — Z940 Kidney transplant status: Secondary | ICD-10-CM | POA: Diagnosis not present

## 2014-02-12 DIAGNOSIS — N183 Chronic kidney disease, stage 3 (moderate): Secondary | ICD-10-CM | POA: Diagnosis not present

## 2014-02-12 DIAGNOSIS — N2581 Secondary hyperparathyroidism of renal origin: Secondary | ICD-10-CM | POA: Diagnosis not present

## 2014-02-12 LAB — POCT HEMOGLOBIN-HEMACUE: HEMOGLOBIN: 9.9 g/dL — AB (ref 13.0–17.0)

## 2014-02-12 MED ORDER — EPOETIN ALFA 10000 UNIT/ML IJ SOLN
INTRAMUSCULAR | Status: AC
  Administered 2014-02-12: 10000 [IU] via SUBCUTANEOUS
  Filled 2014-02-12: qty 1

## 2014-02-12 MED ORDER — EPOETIN ALFA 10000 UNIT/ML IJ SOLN: 30000.0000 [IU] | INTRAMUSCULAR | Status: DC

## 2014-02-12 MED ORDER — EPOETIN ALFA 20000 UNIT/ML IJ SOLN
INTRAMUSCULAR | Status: AC
  Administered 2014-02-12: 20000 [IU] via SUBCUTANEOUS
  Filled 2014-02-12: qty 1

## 2014-02-18 ENCOUNTER — Other Ambulatory Visit: Payer: Self-pay | Admitting: Nurse Practitioner

## 2014-03-12 ENCOUNTER — Encounter (HOSPITAL_COMMUNITY)
Admission: RE | Admit: 2014-03-12 | Discharge: 2014-03-12 | Disposition: A | Discharge status: Home / Self Care | Payer: Medicare Other | Source: Ambulatory Visit | Attending: Nephrology | Admitting: Nephrology

## 2014-03-12 DIAGNOSIS — N2581 Secondary hyperparathyroidism of renal origin: Secondary | ICD-10-CM | POA: Insufficient documentation

## 2014-03-12 DIAGNOSIS — N183 Chronic kidney disease, stage 3 (moderate): Secondary | ICD-10-CM | POA: Insufficient documentation

## 2014-03-12 DIAGNOSIS — Z940 Kidney transplant status: Secondary | ICD-10-CM | POA: Diagnosis not present

## 2014-03-12 DIAGNOSIS — D631 Anemia in chronic kidney disease: Secondary | ICD-10-CM | POA: Diagnosis not present

## 2014-03-12 LAB — URINALYSIS, ROUTINE W REFLEX MICROSCOPIC
Bilirubin Urine: NEGATIVE
Glucose, UA: NEGATIVE mg/dL
Hgb urine dipstick: NEGATIVE
Ketones, ur: NEGATIVE mg/dL
Leukocytes, UA: NEGATIVE
Nitrite: NEGATIVE
Protein, ur: NEGATIVE mg/dL
SPECIFIC GRAVITY, URINE: 1.013 (ref 1.005–1.030)
UROBILINOGEN UA: 0.2 mg/dL (ref 0.0–1.0)
pH: 6 (ref 5.0–8.0)

## 2014-03-12 LAB — COMPREHENSIVE METABOLIC PANEL
ALT: 12 U/L (ref 0–53)
AST: 22 U/L (ref 0–37)
Albumin: 3.2 g/dL — ABNORMAL LOW (ref 3.5–5.2)
Alkaline Phosphatase: 70 U/L (ref 39–117)
Anion gap: 2 — ABNORMAL LOW (ref 5–15)
BUN: 25 mg/dL — AB (ref 6–23)
CALCIUM: 9 mg/dL (ref 8.4–10.5)
CO2: 25 mmol/L (ref 19–32)
Chloride: 111 mEq/L (ref 96–112)
Creatinine, Ser: 2.44 mg/dL — ABNORMAL HIGH (ref 0.50–1.35)
GFR calc non Af Amer: 26 mL/min — ABNORMAL LOW (ref >90)
GFR, EST AFRICAN AMERICAN: 30 mL/min — AB (ref >90)
GLUCOSE: 96 mg/dL (ref 70–99)
Potassium: 4.7 mmol/L (ref 3.5–5.1)
Sodium: 138 mmol/L (ref 135–145)
TOTAL PROTEIN: 8.3 g/dL (ref 6.0–8.3)
Total Bilirubin: 0.6 mg/dL (ref 0.3–1.2)

## 2014-03-12 LAB — LIPID PANEL
CHOLESTEROL: 133 mg/dL (ref 0–200)
HDL: 50 mg/dL (ref >39)
LDL Cholesterol: 62 mg/dL (ref 0–99)
Total CHOL/HDL Ratio: 2.7 RATIO
Triglycerides: 103 mg/dL (ref <150)
VLDL: 21 mg/dL (ref 0–40)

## 2014-03-12 LAB — CBC
HEMATOCRIT: 30.3 % — AB (ref 39.0–52.0)
HEMOGLOBIN: 9.9 g/dL — AB (ref 13.0–17.0)
MCH: 28 pg (ref 26.0–34.0)
MCHC: 32.7 g/dL (ref 30.0–36.0)
MCV: 85.8 fL (ref 78.0–100.0)
Platelets: 222 10*3/uL (ref 150–400)
RBC: 3.53 MIL/uL — ABNORMAL LOW (ref 4.22–5.81)
RDW: 13.5 % (ref 11.5–15.5)
WBC: 6.4 10*3/uL (ref 4.0–10.5)

## 2014-03-12 LAB — MAGNESIUM: Magnesium: 1.6 mg/dL (ref 1.5–2.5)

## 2014-03-12 LAB — PHOSPHORUS: Phosphorus: 2.2 mg/dL — ABNORMAL LOW (ref 2.3–4.6)

## 2014-03-12 MED ORDER — EPOETIN ALFA 10000 UNIT/ML IJ SOLN
INTRAMUSCULAR | Status: AC
  Administered 2014-03-12: 10000 [IU] via SUBCUTANEOUS
  Filled 2014-03-12: qty 1

## 2014-03-12 MED ORDER — EPOETIN ALFA 10000 UNIT/ML IJ SOLN: 30000.0000 [IU] | INTRAMUSCULAR | Status: DC

## 2014-03-12 MED ORDER — EPOETIN ALFA 20000 UNIT/ML IJ SOLN
INTRAMUSCULAR | Status: AC
  Administered 2014-03-12: 20000 [IU] via SUBCUTANEOUS
  Filled 2014-03-12: qty 1

## 2014-03-15 LAB — TACROLIMUS LEVEL: Tacrolimus (FK506) - LabCorp: 6.4 ng/mL (ref 2.0–20.0)

## 2014-03-15 LAB — PTH, INTACT AND CALCIUM
Calcium, Total (PTH): 9 mg/dL (ref 8.6–10.2)
PTH: 38 pg/mL (ref 15–65)

## 2014-04-02 ENCOUNTER — Encounter (HOSPITAL_COMMUNITY)
Admission: RE | Admit: 2014-04-02 | Discharge: 2014-04-02 | Disposition: A | Discharge status: Home / Self Care | Payer: Medicare Other | Source: Ambulatory Visit | Attending: Nephrology | Admitting: Nephrology

## 2014-04-02 DIAGNOSIS — N183 Chronic kidney disease, stage 3 (moderate): Secondary | ICD-10-CM | POA: Diagnosis not present

## 2014-04-02 DIAGNOSIS — Z940 Kidney transplant status: Secondary | ICD-10-CM | POA: Insufficient documentation

## 2014-04-02 DIAGNOSIS — D631 Anemia in chronic kidney disease: Secondary | ICD-10-CM | POA: Insufficient documentation

## 2014-04-02 DIAGNOSIS — N2581 Secondary hyperparathyroidism of renal origin: Secondary | ICD-10-CM | POA: Insufficient documentation

## 2014-04-02 LAB — POCT HEMOGLOBIN-HEMACUE: Hemoglobin: 9.5 g/dL — ABNORMAL LOW (ref 13.0–17.0)

## 2014-04-02 MED ORDER — EPOETIN ALFA 10000 UNIT/ML IJ SOLN: 30000.0000 [IU] | INTRAMUSCULAR | Status: DC

## 2014-04-02 MED ORDER — EPOETIN ALFA 20000 UNIT/ML IJ SOLN
INTRAMUSCULAR | Status: AC
  Administered 2014-04-02: 20000 [IU]
  Filled 2014-04-02: qty 1

## 2014-04-02 MED ORDER — EPOETIN ALFA 10000 UNIT/ML IJ SOLN
INTRAMUSCULAR | Status: AC
  Administered 2014-04-02: 10000 [IU]
  Filled 2014-04-02: qty 1

## 2014-04-23 ENCOUNTER — Encounter (HOSPITAL_COMMUNITY)
Admission: RE | Admit: 2014-04-23 | Discharge: 2014-04-23 | Disposition: A | Discharge status: Home / Self Care | Payer: Medicare Other | Source: Ambulatory Visit | Attending: Nephrology | Admitting: Nephrology

## 2014-04-23 DIAGNOSIS — N2581 Secondary hyperparathyroidism of renal origin: Secondary | ICD-10-CM | POA: Insufficient documentation

## 2014-04-23 DIAGNOSIS — Z940 Kidney transplant status: Secondary | ICD-10-CM | POA: Insufficient documentation

## 2014-04-23 DIAGNOSIS — D631 Anemia in chronic kidney disease: Secondary | ICD-10-CM | POA: Diagnosis not present

## 2014-04-23 DIAGNOSIS — N183 Chronic kidney disease, stage 3 (moderate): Secondary | ICD-10-CM | POA: Diagnosis not present

## 2014-04-23 LAB — POCT HEMOGLOBIN-HEMACUE: HEMOGLOBIN: 10.1 g/dL — AB (ref 13.0–17.0)

## 2014-04-23 MED ORDER — EPOETIN ALFA 10000 UNIT/ML IJ SOLN
30000.0000 [IU] | INTRAMUSCULAR | Status: DC
  Administered 2014-04-23: 30000 [IU] via SUBCUTANEOUS

## 2014-04-24 MED ORDER — EPOETIN ALFA 10000 UNIT/ML IJ SOLN
INTRAMUSCULAR | Status: AC
  Filled 2014-04-24: qty 1

## 2014-04-24 MED ORDER — EPOETIN ALFA 20000 UNIT/ML IJ SOLN
INTRAMUSCULAR | Status: AC
  Filled 2014-04-24: qty 1

## 2014-04-26 DIAGNOSIS — I129 Hypertensive chronic kidney disease with stage 1 through stage 4 chronic kidney disease, or unspecified chronic kidney disease: Secondary | ICD-10-CM | POA: Diagnosis not present

## 2014-04-26 DIAGNOSIS — D631 Anemia in chronic kidney disease: Secondary | ICD-10-CM | POA: Diagnosis not present

## 2014-04-26 DIAGNOSIS — Z940 Kidney transplant status: Secondary | ICD-10-CM | POA: Diagnosis not present

## 2014-04-26 DIAGNOSIS — N2581 Secondary hyperparathyroidism of renal origin: Secondary | ICD-10-CM | POA: Diagnosis not present

## 2014-04-26 MED FILL — Epoetin Alfa Inj 10000 Unit/ML: INTRAMUSCULAR | Qty: 1 | Status: AC

## 2014-04-26 MED FILL — Epoetin Alfa Inj 20000 Unit/ML: INTRAMUSCULAR | Qty: 1 | Status: AC

## 2014-05-14 ENCOUNTER — Encounter (HOSPITAL_COMMUNITY)
Admission: RE | Admit: 2014-05-14 | Discharge: 2014-05-14 | Disposition: A | Discharge status: Home / Self Care | Payer: Medicare Other | Source: Ambulatory Visit | Attending: Nephrology | Admitting: Nephrology

## 2014-05-14 DIAGNOSIS — D631 Anemia in chronic kidney disease: Secondary | ICD-10-CM | POA: Insufficient documentation

## 2014-05-14 DIAGNOSIS — N183 Chronic kidney disease, stage 3 (moderate): Secondary | ICD-10-CM | POA: Insufficient documentation

## 2014-05-14 DIAGNOSIS — Z940 Kidney transplant status: Secondary | ICD-10-CM | POA: Diagnosis not present

## 2014-05-14 DIAGNOSIS — N2581 Secondary hyperparathyroidism of renal origin: Secondary | ICD-10-CM | POA: Insufficient documentation

## 2014-05-14 LAB — POCT HEMOGLOBIN-HEMACUE: HEMOGLOBIN: 10 g/dL — AB (ref 13.0–17.0)

## 2014-05-14 MED ORDER — EPOETIN ALFA 20000 UNIT/ML IJ SOLN
INTRAMUSCULAR | Status: AC
  Filled 2014-05-14: qty 1

## 2014-05-14 MED ORDER — EPOETIN ALFA 10000 UNIT/ML IJ SOLN
INTRAMUSCULAR | Status: DC
Start: 2014-05-14 — End: 2014-05-15
  Filled 2014-05-14: qty 1

## 2014-05-14 MED ORDER — EPOETIN ALFA 10000 UNIT/ML IJ SOLN
30000.0000 [IU] | INTRAMUSCULAR | Status: DC
  Administered 2014-05-14: 30000 [IU] via SUBCUTANEOUS

## 2014-05-18 MED FILL — Epoetin Alfa Inj 10000 Unit/ML: INTRAMUSCULAR | Qty: 1 | Status: AC

## 2014-05-18 MED FILL — Epoetin Alfa Inj 20000 Unit/ML: INTRAMUSCULAR | Qty: 1 | Status: AC

## 2014-06-04 ENCOUNTER — Encounter (HOSPITAL_COMMUNITY)
Admission: RE | Admit: 2014-06-04 | Discharge: 2014-06-04 | Disposition: A | Discharge status: Home / Self Care | Payer: Medicare Other | Source: Ambulatory Visit | Attending: Nephrology | Admitting: Nephrology

## 2014-06-04 DIAGNOSIS — D631 Anemia in chronic kidney disease: Secondary | ICD-10-CM | POA: Diagnosis not present

## 2014-06-04 DIAGNOSIS — N183 Chronic kidney disease, stage 3 (moderate): Secondary | ICD-10-CM | POA: Insufficient documentation

## 2014-06-04 DIAGNOSIS — N2581 Secondary hyperparathyroidism of renal origin: Secondary | ICD-10-CM | POA: Insufficient documentation

## 2014-06-04 DIAGNOSIS — Z940 Kidney transplant status: Secondary | ICD-10-CM | POA: Diagnosis not present

## 2014-06-04 LAB — URINALYSIS, ROUTINE W REFLEX MICROSCOPIC
BILIRUBIN URINE: NEGATIVE
Glucose, UA: NEGATIVE mg/dL
Hgb urine dipstick: NEGATIVE
Ketones, ur: NEGATIVE mg/dL
Leukocytes, UA: NEGATIVE
Nitrite: NEGATIVE
PH: 5.5 (ref 5.0–8.0)
Protein, ur: NEGATIVE mg/dL
Specific Gravity, Urine: 1.013 (ref 1.005–1.030)
Urobilinogen, UA: 0.2 mg/dL (ref 0.0–1.0)

## 2014-06-04 LAB — COMPREHENSIVE METABOLIC PANEL
ALBUMIN: 3.3 g/dL — AB (ref 3.5–5.2)
ALK PHOS: 80 U/L (ref 39–117)
ALT: 13 U/L (ref 0–53)
AST: 21 U/L (ref 0–37)
Anion gap: 6 (ref 5–15)
BILIRUBIN TOTAL: 0.8 mg/dL (ref 0.3–1.2)
BUN: 33 mg/dL — ABNORMAL HIGH (ref 6–23)
CO2: 21 mmol/L (ref 19–32)
Calcium: 9.3 mg/dL (ref 8.4–10.5)
Chloride: 110 mmol/L (ref 96–112)
Creatinine, Ser: 2.59 mg/dL — ABNORMAL HIGH (ref 0.50–1.35)
GFR calc Af Amer: 28 mL/min — ABNORMAL LOW (ref >90)
GFR calc non Af Amer: 24 mL/min — ABNORMAL LOW (ref >90)
Glucose, Bld: 103 mg/dL — ABNORMAL HIGH (ref 70–99)
Potassium: 4.4 mmol/L (ref 3.5–5.1)
Sodium: 137 mmol/L (ref 135–145)
Total Protein: 8.8 g/dL — ABNORMAL HIGH (ref 6.0–8.3)

## 2014-06-04 LAB — LIPID PANEL
CHOL/HDL RATIO: 2.7 ratio
Cholesterol: 142 mg/dL (ref 0–200)
HDL: 52 mg/dL (ref >39)
LDL CALC: 64 mg/dL (ref 0–99)
Triglycerides: 130 mg/dL (ref <150)
VLDL: 26 mg/dL (ref 0–40)

## 2014-06-04 LAB — CBC
HCT: 29.4 % — ABNORMAL LOW (ref 39.0–52.0)
Hemoglobin: 9.9 g/dL — ABNORMAL LOW (ref 13.0–17.0)
MCH: 28.4 pg (ref 26.0–34.0)
MCHC: 33.7 g/dL (ref 30.0–36.0)
MCV: 84.5 fL (ref 78.0–100.0)
PLATELETS: 203 10*3/uL (ref 150–400)
RBC: 3.48 MIL/uL — ABNORMAL LOW (ref 4.22–5.81)
RDW: 14.1 % (ref 11.5–15.5)
WBC: 6.5 10*3/uL (ref 4.0–10.5)

## 2014-06-04 LAB — PHOSPHORUS: Phosphorus: 2.1 mg/dL — ABNORMAL LOW (ref 2.3–4.6)

## 2014-06-04 LAB — MAGNESIUM: Magnesium: 1.6 mg/dL (ref 1.5–2.5)

## 2014-06-04 MED ORDER — EPOETIN ALFA 20000 UNIT/ML IJ SOLN
INTRAMUSCULAR | Status: AC
  Administered 2014-06-04: 20000 [IU] via SUBCUTANEOUS
  Filled 2014-06-04: qty 1

## 2014-06-04 MED ORDER — EPOETIN ALFA 10000 UNIT/ML IJ SOLN: 30000.0000 [IU] | INTRAMUSCULAR | Status: DC

## 2014-06-04 MED ORDER — EPOETIN ALFA 10000 UNIT/ML IJ SOLN
INTRAMUSCULAR | Status: AC
  Administered 2014-06-04: 10000 [IU] via SUBCUTANEOUS
  Filled 2014-06-04: qty 1

## 2014-06-05 LAB — PTH, INTACT AND CALCIUM
Calcium, Total (PTH): 9 mg/dL (ref 8.6–10.2)
PTH: 66 pg/mL — ABNORMAL HIGH (ref 15–65)

## 2014-06-05 LAB — IRON AND TIBC
Iron: 115 ug/dL (ref 42–165)
SATURATION RATIOS: 73 % — AB (ref 20–55)
TIBC: 158 ug/dL — ABNORMAL LOW (ref 215–435)
UIBC: 43 ug/dL — ABNORMAL LOW (ref 125–400)

## 2014-06-05 LAB — FERRITIN: Ferritin: 744 ng/mL — ABNORMAL HIGH (ref 22–322)

## 2014-06-06 LAB — TACROLIMUS LEVEL: TACROLIMUS (FK506) - LABCORP: 5.5 ng/mL (ref 2.0–20.0)

## 2014-06-25 ENCOUNTER — Encounter (HOSPITAL_COMMUNITY)
Admission: RE | Admit: 2014-06-25 | Discharge: 2014-06-25 | Disposition: A | Discharge status: Home / Self Care | Payer: Medicare Other | Source: Ambulatory Visit | Attending: Nephrology | Admitting: Nephrology

## 2014-06-25 DIAGNOSIS — D631 Anemia in chronic kidney disease: Secondary | ICD-10-CM | POA: Diagnosis not present

## 2014-06-25 DIAGNOSIS — Z940 Kidney transplant status: Secondary | ICD-10-CM | POA: Diagnosis not present

## 2014-06-25 DIAGNOSIS — N183 Chronic kidney disease, stage 3 (moderate): Secondary | ICD-10-CM | POA: Diagnosis not present

## 2014-06-25 DIAGNOSIS — N2581 Secondary hyperparathyroidism of renal origin: Secondary | ICD-10-CM | POA: Diagnosis not present

## 2014-06-25 LAB — POCT HEMOGLOBIN-HEMACUE: Hemoglobin: 9.4 g/dL — ABNORMAL LOW (ref 13.0–17.0)

## 2014-06-25 MED ORDER — EPOETIN ALFA 20000 UNIT/ML IJ SOLN
INTRAMUSCULAR | Status: AC
  Administered 2014-06-25: 20000 [IU] via SUBCUTANEOUS
  Filled 2014-06-25: qty 1

## 2014-06-25 MED ORDER — EPOETIN ALFA 10000 UNIT/ML IJ SOLN: 30000.0000 [IU] | INTRAMUSCULAR | Status: DC

## 2014-06-25 MED ORDER — EPOETIN ALFA 10000 UNIT/ML IJ SOLN
INTRAMUSCULAR | Status: AC
  Administered 2014-06-25: 10000 [IU] via SUBCUTANEOUS
  Filled 2014-06-25: qty 1

## 2014-07-16 ENCOUNTER — Encounter (HOSPITAL_COMMUNITY)
Admission: RE | Admit: 2014-07-16 | Discharge: 2014-07-16 | Disposition: A | Discharge status: Home / Self Care | Payer: Medicare Other | Source: Ambulatory Visit | Attending: Nephrology | Admitting: Nephrology

## 2014-07-16 DIAGNOSIS — N183 Chronic kidney disease, stage 3 (moderate): Secondary | ICD-10-CM | POA: Insufficient documentation

## 2014-07-16 DIAGNOSIS — Z940 Kidney transplant status: Secondary | ICD-10-CM | POA: Insufficient documentation

## 2014-07-16 DIAGNOSIS — N2581 Secondary hyperparathyroidism of renal origin: Secondary | ICD-10-CM | POA: Diagnosis not present

## 2014-07-16 DIAGNOSIS — D631 Anemia in chronic kidney disease: Secondary | ICD-10-CM | POA: Diagnosis not present

## 2014-07-16 LAB — POCT HEMOGLOBIN-HEMACUE: HEMOGLOBIN: 9.8 g/dL — AB (ref 13.0–17.0)

## 2014-07-16 MED ORDER — EPOETIN ALFA 10000 UNIT/ML IJ SOLN
30000.0000 [IU] | INTRAMUSCULAR | Status: DC
  Administered 2014-07-16: 10000 [IU] via SUBCUTANEOUS

## 2014-07-16 MED ORDER — EPOETIN ALFA 20000 UNIT/ML IJ SOLN
INTRAMUSCULAR | Status: AC
  Administered 2014-07-16: 20000 [IU] via SUBCUTANEOUS
  Filled 2014-07-16: qty 1

## 2014-07-16 MED ORDER — EPOETIN ALFA 10000 UNIT/ML IJ SOLN
INTRAMUSCULAR | Status: AC
  Administered 2014-07-16: 10000 [IU] via SUBCUTANEOUS
  Filled 2014-07-16: qty 1

## 2014-08-06 ENCOUNTER — Inpatient Hospital Stay (HOSPITAL_COMMUNITY): Admission: RE | Admit: 2014-08-06 | Payer: PRIVATE HEALTH INSURANCE | Source: Ambulatory Visit

## 2014-08-09 ENCOUNTER — Encounter (HOSPITAL_COMMUNITY)
Admission: RE | Admit: 2014-08-09 | Discharge: 2014-08-09 | Disposition: A | Discharge status: Home / Self Care | Payer: Medicare Other | Source: Ambulatory Visit | Attending: Nephrology | Admitting: Nephrology

## 2014-08-09 DIAGNOSIS — N2581 Secondary hyperparathyroidism of renal origin: Secondary | ICD-10-CM | POA: Insufficient documentation

## 2014-08-09 DIAGNOSIS — D631 Anemia in chronic kidney disease: Secondary | ICD-10-CM | POA: Diagnosis not present

## 2014-08-09 DIAGNOSIS — Z940 Kidney transplant status: Secondary | ICD-10-CM | POA: Diagnosis not present

## 2014-08-09 DIAGNOSIS — N183 Chronic kidney disease, stage 3 (moderate): Secondary | ICD-10-CM | POA: Diagnosis not present

## 2014-08-09 LAB — POCT HEMOGLOBIN-HEMACUE: Hemoglobin: 9.7 g/dL — ABNORMAL LOW (ref 13.0–17.0)

## 2014-08-09 MED ORDER — EPOETIN ALFA 10000 UNIT/ML IJ SOLN
30000.0000 [IU] | INTRAMUSCULAR | Status: DC
  Administered 2014-08-09: 30000 [IU] via SUBCUTANEOUS

## 2014-08-10 MED FILL — Epoetin Alfa Inj 20000 Unit/ML: INTRAMUSCULAR | Qty: 1 | Status: AC

## 2014-08-10 MED FILL — Epoetin Alfa Inj 10000 Unit/ML: INTRAMUSCULAR | Qty: 1 | Status: AC

## 2014-08-27 ENCOUNTER — Encounter (HOSPITAL_COMMUNITY): Payer: PRIVATE HEALTH INSURANCE

## 2014-08-30 ENCOUNTER — Encounter (HOSPITAL_COMMUNITY)
Admission: RE | Admit: 2014-08-30 | Discharge: 2014-08-30 | Disposition: A | Discharge status: Home / Self Care | Payer: Medicare Other | Source: Ambulatory Visit | Attending: Nephrology | Admitting: Nephrology

## 2014-08-30 DIAGNOSIS — N183 Chronic kidney disease, stage 3 (moderate): Secondary | ICD-10-CM | POA: Diagnosis not present

## 2014-08-30 DIAGNOSIS — I129 Hypertensive chronic kidney disease with stage 1 through stage 4 chronic kidney disease, or unspecified chronic kidney disease: Secondary | ICD-10-CM | POA: Diagnosis not present

## 2014-08-30 DIAGNOSIS — D631 Anemia in chronic kidney disease: Secondary | ICD-10-CM | POA: Diagnosis not present

## 2014-08-30 DIAGNOSIS — N2581 Secondary hyperparathyroidism of renal origin: Secondary | ICD-10-CM | POA: Diagnosis not present

## 2014-08-30 DIAGNOSIS — Z940 Kidney transplant status: Secondary | ICD-10-CM | POA: Diagnosis not present

## 2014-08-30 LAB — POCT HEMOGLOBIN-HEMACUE: Hemoglobin: 9.8 g/dL — ABNORMAL LOW (ref 13.0–17.0)

## 2014-08-30 MED ORDER — EPOETIN ALFA 10000 UNIT/ML IJ SOLN
INTRAMUSCULAR | Status: AC
  Administered 2014-08-30: 10000 [IU] via SUBCUTANEOUS
  Filled 2014-08-30: qty 1

## 2014-08-30 MED ORDER — EPOETIN ALFA 10000 UNIT/ML IJ SOLN
30000.0000 [IU] | INTRAMUSCULAR | Status: DC
  Administered 2014-08-30: 10000 [IU] via SUBCUTANEOUS

## 2014-08-30 MED ORDER — EPOETIN ALFA 20000 UNIT/ML IJ SOLN
INTRAMUSCULAR | Status: AC
  Administered 2014-08-30: 20000 [IU]
  Filled 2014-08-30: qty 1

## 2014-09-15 ENCOUNTER — Emergency Department (HOSPITAL_COMMUNITY)
Admission: EM | Admit: 2014-09-15 | Discharge: 2014-09-15 | Disposition: A | Discharge status: Home / Self Care | Payer: Worker's Compensation | Attending: Emergency Medicine | Admitting: Emergency Medicine

## 2014-09-15 ENCOUNTER — Encounter (HOSPITAL_COMMUNITY): Payer: Self-pay

## 2014-09-15 DIAGNOSIS — Y9389 Activity, other specified: Secondary | ICD-10-CM | POA: Insufficient documentation

## 2014-09-15 DIAGNOSIS — Z23 Encounter for immunization: Secondary | ICD-10-CM | POA: Insufficient documentation

## 2014-09-15 DIAGNOSIS — Z87448 Personal history of other diseases of urinary system: Secondary | ICD-10-CM | POA: Diagnosis not present

## 2014-09-15 DIAGNOSIS — W272XXA Contact with scissors, initial encounter: Secondary | ICD-10-CM | POA: Diagnosis not present

## 2014-09-15 DIAGNOSIS — S61209A Unspecified open wound of unspecified finger without damage to nail, initial encounter: Secondary | ICD-10-CM

## 2014-09-15 DIAGNOSIS — Y9289 Other specified places as the place of occurrence of the external cause: Secondary | ICD-10-CM | POA: Diagnosis not present

## 2014-09-15 DIAGNOSIS — Y990 Civilian activity done for income or pay: Secondary | ICD-10-CM | POA: Diagnosis not present

## 2014-09-15 DIAGNOSIS — S61219A Laceration without foreign body of unspecified finger without damage to nail, initial encounter: Secondary | ICD-10-CM

## 2014-09-15 DIAGNOSIS — I10 Essential (primary) hypertension: Secondary | ICD-10-CM | POA: Insufficient documentation

## 2014-09-15 DIAGNOSIS — S61211A Laceration without foreign body of left index finger without damage to nail, initial encounter: Secondary | ICD-10-CM | POA: Diagnosis present

## 2014-09-15 MED ORDER — TETANUS-DIPHTH-ACELL PERTUSSIS 5-2.5-18.5 LF-MCG/0.5 IM SUSP
0.5000 mL | Freq: Once | INTRAMUSCULAR | Status: AC
  Administered 2014-09-15: 0.5 mL via INTRAMUSCULAR
  Filled 2014-09-15: qty 0.5

## 2014-09-15 NOTE — ED Notes (Signed)
Pt still has not returned to room at this time.  Left prior to d/c instructions and VS.

## 2014-09-15 NOTE — ED Notes (Signed)
Pt cut left index finger with scissors this morning.  Pt not up to date on tetanus shot.

## 2014-09-15 NOTE — Discharge Instructions (Signed)
Keep area covered with a bandage, keep bandage dry, and do not submerge in water. Ice and elevate for additional pain relief and swelling. Alternate between motrin and Tylenol for additional pain relief. Follow up with your primary care doctor or the [facility] in approximately 2-3 days for wound recheck. Monitor area for signs of infection to include, but not limited to: increasing pain, redness, drainage/pus, or swelling. Return to emergency department for emergent changing or worsening symptoms.    Fingertip Injuries and Amputations Fingertip injuries are common and often get injured because they are last to escape when pulling your hand out of harm's way. You have amputated (cut off) part of your finger. How this turns out depends largely on how much was amputated. If just the tip is amputated, often the end of the finger will grow back and the finger may return to much the same as it was before the injury.  If more of the finger is missing, your caregiver has done the best with the tissue remaining to allow you to keep as much finger as is possible. Your caregiver after checking your injury has tried to leave you with a painless fingertip that has durable, feeling skin. If possible, your caregiver has tried to maintain the finger's length and appearance and preserve its fingernail.  Please read the instructions outlined below and refer to this sheet in the next few weeks. These instructions provide you with general information on caring for yourself. Your caregiver may also give you specific instructions. While your treatment has been done according to the most current medical practices available, unavoidable complications occasionally occur. If you have any problems or questions after discharge, please call your caregiver. HOME CARE INSTRUCTIONS   You may resume normal diet and activities as directed or allowed.  Keep your hand elevated above the level of your heart. This helps  decrease pain and swelling.  Keep ice packs (or a bag of ice wrapped in a towel) on the injured area for 15-20 minutes, 03-04 times per day, for the first two days.  Change dressings if necessary or as directed.  Clean the wound daily or as directed.  Only take over-the-counter or prescription medicines for pain, discomfort, or fever as directed by your caregiver.  Keep appointments as directed. SEEK IMMEDIATE MEDICAL CARE IF:  You develop redness, swelling, numbness or increasing pain in the wound.  There is pus coming from the wound.  You develop an unexplained oral temperature above 102 F (38.9 C) or as your caregiver suggests.  There is a foul (bad) smell coming from the wound or dressing.  There is a breaking open of the wound (edges not staying together) after sutures or staples have been removed. MAKE SURE YOU:   Understand these instructions.  Will watch your condition.  Will get help right away if you are not doing well or get worse. Document Released: 01/10/2005 Document Revised: 05/14/2011 Document Reviewed: 12/10/2007 [facility] Patient Information 2015 [place], [place]. This information is not intended to replace advice given to you by your health care provider. Make sure you discuss any questions you have with your health care provider.  Non-Sutured Laceration A laceration is a cut or wound that goes through all layers of the skin and into the tissue just beneath the skin. Usually, these are stitched up or held together with tape or glue shortly after the injury occurred. However, if several or more hours have passed before getting care, too many germs (bacteria)  get into the laceration. Stitching it closed would bring the risk of infection. If your health care provider feels your laceration is too old, it may be left open and then bandaged to allow healing from the bottom layer up. HOME CARE INSTRUCTIONS   Change the bandage (dressing) 2 times a day or as directed by  your health care provider.  If the dressing or packing gauze sticks, soak it off with soapy water.  When you re-bandage your laceration, make sure that the dressing or packing gauze goes all the way to the bottom of the laceration. The top of the laceration is kept open so it can heal from the bottom up. There is less chance for infection with this method.  Wash the area with soap and water 2 times a day to remove all the creams or ointments, if used. Rinse off the soap. Pat the area dry with a clean towel. Look for signs of infection, such as redness, swelling, or a red line that goes away from the laceration.  Re-apply creams or ointments if they were used to bandage the laceration. This helps keep the bandage from sticking.  If the bandage becomes wet, dirty, or has a bad smell, change it as soon as possible.  Only take medicine as directed by your health care provider. You might need a tetanus shot now if:  You have no idea when you had the last one.  You have never had a tetanus shot before.  Your laceration had dirt in it.  Your laceration was dirty, and your last tetanus shot was more than 7 years ago.  Your laceration was clean, and your last tetanus shot was more than 10 years ago. If you need a tetanus shot, and you decide not to get one, there is a rare chance of getting tetanus. Sickness from tetanus can be serious. If you got a tetanus shot, your arm may swell and get red and warm to the touch at the shot site. This is common and not a problem. SEEK MEDICAL CARE IF:   You have redness, swelling, or increasing pain in the laceration.  You notice a red line that goes away from your laceration.  You have pus coming from the laceration.  You have a fever.  You notice a bad smell coming from the laceration or dressing.  You notice something coming out of the laceration, such as wood or glass.  Your laceration is on your hand or foot and you are unable to properly move a  finger or toe.  You have severe swelling around the laceration, causing pain and numbness.  You notice a change in color in your arm, hand, leg, or foot. MAKE SURE YOU:   Understand these instructions.  Will watch your condition.  Will get help right away if you are not doing well or get worse. Document Released: 01/17/2006 Document Revised: 02/24/2013 Document Reviewed: 08/09/2008 [facility] Patient Information 2015 [place], [place]. This information is not intended to replace advice given to you by your health care provider. Make sure you discuss any questions you have with your health care provider.  Wound Care Wound care helps prevent pain and infection.  You may need a tetanus shot if:  You cannot remember when you had your last tetanus shot.  You have never had a tetanus shot.  The injury broke your skin. If you need a tetanus shot and you choose not to have one, you may get tetanus. Sickness from tetanus can be  serious. HOME CARE   Only take medicine as told by your doctor.  Clean the wound daily with mild soap and water.  Change any bandages (dressings) as told by your doctor.  Put medicated cream and a bandage on the wound as told by your doctor.  Change the bandage if it gets wet, dirty, or starts to smell.  Take showers. Do not take baths, swim, or do anything that puts your wound under water.  Rest and raise (elevate) the wound until the pain and puffiness (swelling) are better.  Keep all doctor visits as told. GET HELP RIGHT AWAY IF:   Yellowish-white fluid (pus) comes from the wound.  Medicine does not lessen your pain.  There is a red streak going away from the wound.  You have a fever. MAKE SURE YOU:   Understand these instructions.  Will watch your condition.  Will get help right away if you are not doing well or get worse. Document Released: 11/29/2007 Document Revised: 05/14/2011 Document Reviewed: 06/25/2010 [facility] Patient Information  2015 [place], [place]. This information is not intended to replace advice given to you by your health care provider. Make sure you discuss any questions you have with your health care provider.

## 2014-09-15 NOTE — ED Notes (Signed)
Pt reports needing a drug test for worker's comp claim for this incident.  Tech aware and will complete.

## 2014-09-15 NOTE — ED Provider Notes (Signed)
CSN: [id number]     Arrival date & time 09/15/14  [id number] History   First MD Initiated Contact with Patient 09/15/14 1003     Chief Complaint  Patient presents with  . Laceration     (Consider location/radiation/quality/duration/timing/severity/associated sxs/prior Treatment) HPI Comments: Francisco Williamson is a 69 y.o. male with a PMHx of HTN and renal disorder, who presents to the ED with complaints of laceration to the left index finger that occurred around 9 AM. He states he was cutting some wires and clipped the distal tip of his index finger. He is not up-to-date with his tetanus. Bleeding was controlled with pressure. He denies any pain. Of note he has not taken his blood pressure medication today. He denies any vision changes, headache, chest pain, shortness breath, abdominal pain, nausea, vomiting, loss of range of motion of the index finger, numbness, tingling, or weakness. He is not on any blood thinners.  Patient is a 69 y.o. male presenting with skin laceration. The history is provided by the patient. No language interpreter was used.  Laceration Location:  Hand Hand laceration location:  L finger Depth:  Cutaneous Quality: avulsion   Bleeding: controlled   Time since incident:  1 hour Injury mechanism: scissors. Pain details:    Severity:  No pain   Progression:  Unchanged Foreign body present:  No foreign bodies Relieved by:  None tried Worsened by:  Nothing tried Ineffective treatments:  None tried Tetanus status:  Out of date   Past Medical History  Diagnosis Date  . Renal disorder   . Hypertension    Past Surgical History  Procedure Laterality Date  . Nephrectomy transplanted organ     History reviewed. No pertinent family history. History  Substance Use Topics  . Smoking status: Never Smoker   . Smokeless tobacco: Not on file  . Alcohol Use: No    Review of Systems  Eyes: Negative for visual disturbance.  Respiratory: Negative for shortness of breath.     Cardiovascular: Negative for chest pain.  Gastrointestinal: Negative for nausea, vomiting and abdominal pain.  Musculoskeletal: Negative for myalgias and arthralgias.  Skin: Positive for wound.  Allergic/Immunologic: Negative for immunocompromised state.  Neurological: Negative for weakness, numbness and headaches.  Hematological: Does not bruise/bleed easily.  Psychiatric/Behavioral: Negative for confusion.   10 Systems reviewed and are negative for acute change except as noted in the HPI.    Allergies  Review of patient's allergies indicates no known allergies.  Home Medications   Prior to Admission medications   Medication Sig Start Date End Date Taking? Authorizing Provider  AmLODIPine Besylate (NORVASC PO) Take by mouth.    Historical Provider, MD  IRON PO Take by mouth.    Historical Provider, MD  PREDNISONE PO Take by mouth.    Historical Provider, MD  Sulfamethoxazole-Trimethoprim (BACTRIM PO) Take by mouth.    Historical Provider, MD   BP 179/83 mmHg  Pulse 74  Temp(Src) 97.9 F (36.6 C) (Oral)  Resp 18  SpO2 100% Physical Exam  Constitutional: He is oriented to person, place, and time. Vital signs are normal. He appears well-developed and well-nourished.  Non-toxic appearance. No distress.  Afebrile, nontoxic, NAD. Mild HTN  HENT:  Head: Normocephalic and atraumatic.  Mouth/Throat: Mucous membranes are normal.  Eyes: Conjunctivae and EOM are normal. Right eye exhibits no discharge. Left eye exhibits no discharge.  Neck: Normal range of motion. Neck supple.  Cardiovascular: Normal rate.   Pulmonary/Chest: Effort normal. No respiratory distress.  Abdominal: Normal  appearance. He exhibits no distension.  Musculoskeletal: Normal range of motion.       Left hand: He exhibits laceration. He exhibits normal range of motion, no tenderness, no bony tenderness, normal two-point discrimination, normal capillary refill and no swelling. Normal sensation noted. Normal strength  noted.  L index finger with FROM intact at all joints, no bony TTP, no swelling, with distal tip flap of skin removed, bleeding controlled. No nailbed injury. Cap refill brisk and present, strength and sensation grossly intact. SEE PICTURE BELOW  Neurological: He is alert and oriented to person, place, and time. He has normal strength. No sensory deficit.  Skin: Skin is warm and dry. Laceration noted. No rash noted.  L index finger lac as noted above and pictured below  Psychiatric: He has a normal mood and affect.  Nursing note and vitals reviewed.     ED Course  Wound repair Date/Time: 09/15/2014 10:34 AM Performed by: Zacarias Pontes Authorized by: Zacarias Pontes Consent: Verbal consent obtained. Risks and benefits: risks, benefits and alternatives were discussed Consent given by: patient Patient understanding: patient states understanding of the procedure being performed Patient consent: the patient's understanding of the procedure matches consent given Patient identity confirmed: verbally with patient Preparation: Patient was prepped and draped in the usual sterile fashion. Local anesthesia used: no Patient sedated: no Patient tolerance: Patient tolerated the procedure well with no immediate complications Comments: Irrigated wound with NS and then applied wound seal   (including critical care time) Labs Review Labs Reviewed - No data to display  Imaging Review No results found.   EKG Interpretation None      MDM   Final diagnoses:  Finger laceration, initial encounter  Fingertip avulsion, initial encounter  HTN (hypertension), benign    69 y.o. male here for small laceration to the left index finger. Digit neurovascularly intact with soft compartments, loss of small flap skin to the distal tip, bleeding controlled, Refill brisk and present, full range of motion intact. Applied wounds seal and updated tetanus. Applied bandage. Discussed follow-up  with PCP in 3 days for wound check. Doubt need for abx. Discussed tylenol/motrin/ice for pain. I explained the diagnosis and have given explicit precautions to return to the ER including for any other new or worsening symptoms. The patient understands and accepts the medical plan as it's been dictated and I have answered their questions. Discharge instructions concerning home care and prescriptions have been given. The patient is STABLE and is discharged to home in good condition.  BP 179/83 mmHg  Pulse 74  Temp(Src) 97.9 F (36.6 C) (Oral)  Resp 18  SpO2 100%  Meds ordered this encounter  Medications  . Tdap (BOOSTRIX) injection 0.5 mL    SigZacarias Pontes, PA-C 09/15/14 1041  Sherwood Gambler, MD 09/15/14 1738  Sherwood Gambler, MD 09/15/14 1740

## 2014-09-15 NOTE — ED Notes (Signed)
Pt completed drug screen per tech, no longer in room.  Assumed to have left dept.  Will re-check as pt left prior to receiving d/c instructions.

## 2014-09-24 ENCOUNTER — Encounter (HOSPITAL_COMMUNITY)
Admission: RE | Admit: 2014-09-24 | Discharge: 2014-09-24 | Disposition: A | Discharge status: Home / Self Care | Payer: Medicare Other | Source: Ambulatory Visit | Attending: Nephrology | Admitting: Nephrology

## 2014-09-24 DIAGNOSIS — Z940 Kidney transplant status: Secondary | ICD-10-CM | POA: Diagnosis not present

## 2014-09-24 DIAGNOSIS — N2581 Secondary hyperparathyroidism of renal origin: Secondary | ICD-10-CM | POA: Diagnosis not present

## 2014-09-24 DIAGNOSIS — D631 Anemia in chronic kidney disease: Secondary | ICD-10-CM | POA: Diagnosis not present

## 2014-09-24 DIAGNOSIS — N183 Chronic kidney disease, stage 3 (moderate): Secondary | ICD-10-CM | POA: Insufficient documentation

## 2014-09-24 LAB — URINALYSIS, ROUTINE W REFLEX MICROSCOPIC
Bilirubin Urine: NEGATIVE
Glucose, UA: NEGATIVE mg/dL
HGB URINE DIPSTICK: NEGATIVE
KETONES UR: NEGATIVE mg/dL
Leukocytes, UA: NEGATIVE
NITRITE: NEGATIVE
Protein, ur: 30 mg/dL — AB
Specific Gravity, Urine: 1.013 (ref 1.005–1.030)
UROBILINOGEN UA: 1 mg/dL (ref 0.0–1.0)
pH: 7 (ref 5.0–8.0)

## 2014-09-24 LAB — COMPREHENSIVE METABOLIC PANEL
ALT: 18 U/L (ref 17–63)
AST: 25 U/L (ref 15–41)
Albumin: 3.1 g/dL — ABNORMAL LOW (ref 3.5–5.0)
Alkaline Phosphatase: 72 U/L (ref 38–126)
Anion gap: 4 — ABNORMAL LOW (ref 5–15)
BUN: 26 mg/dL — AB (ref 6–20)
CALCIUM: 8.9 mg/dL (ref 8.9–10.3)
CO2: 25 mmol/L (ref 22–32)
CREATININE: 2.71 mg/dL — AB (ref 0.61–1.24)
Chloride: 108 mmol/L (ref 101–111)
GFR calc Af Amer: 26 mL/min — ABNORMAL LOW (ref >60)
GFR, EST NON AFRICAN AMERICAN: 23 mL/min — AB (ref >60)
GLUCOSE: 97 mg/dL (ref 65–99)
Potassium: 4.2 mmol/L (ref 3.5–5.1)
Sodium: 137 mmol/L (ref 135–145)
Total Bilirubin: 0.5 mg/dL (ref 0.3–1.2)
Total Protein: 7.9 g/dL (ref 6.5–8.1)

## 2014-09-24 LAB — MAGNESIUM: Magnesium: 1.3 mg/dL — ABNORMAL LOW (ref 1.7–2.4)

## 2014-09-24 LAB — URINE MICROSCOPIC-ADD ON

## 2014-09-24 LAB — CBC
HCT: 29 % — ABNORMAL LOW (ref 39.0–52.0)
Hemoglobin: 9.9 g/dL — ABNORMAL LOW (ref 13.0–17.0)
MCH: 29.2 pg (ref 26.0–34.0)
MCHC: 34.1 g/dL (ref 30.0–36.0)
MCV: 85.5 fL (ref 78.0–100.0)
Platelets: 210 10*3/uL (ref 150–400)
RBC: 3.39 MIL/uL — ABNORMAL LOW (ref 4.22–5.81)
RDW: 13.8 % (ref 11.5–15.5)
WBC: 8.3 10*3/uL (ref 4.0–10.5)

## 2014-09-24 LAB — LIPID PANEL
CHOL/HDL RATIO: 2.8 ratio
CHOLESTEROL: 152 mg/dL (ref 0–200)
HDL: 55 mg/dL (ref >40)
LDL Cholesterol: 75 mg/dL (ref 0–99)
Triglycerides: 111 mg/dL (ref <150)
VLDL: 22 mg/dL (ref 0–40)

## 2014-09-24 LAB — FERRITIN: Ferritin: 482 ng/mL — ABNORMAL HIGH (ref 24–336)

## 2014-09-24 LAB — PHOSPHORUS: PHOSPHORUS: 3 mg/dL (ref 2.5–4.6)

## 2014-09-24 LAB — IRON AND TIBC
Iron: 98 ug/dL (ref 45–182)
SATURATION RATIOS: 53 % — AB (ref 17.9–39.5)
TIBC: 185 ug/dL — AB (ref 250–450)
UIBC: 87 ug/dL

## 2014-09-24 MED ORDER — EPOETIN ALFA 20000 UNIT/ML IJ SOLN
INTRAMUSCULAR | Status: AC
Start: 2014-09-24 — End: 2014-09-24
  Administered 2014-09-24: 20000 [IU]
  Filled 2014-09-24: qty 1

## 2014-09-24 MED ORDER — EPOETIN ALFA 10000 UNIT/ML IJ SOLN
INTRAMUSCULAR | Status: AC
  Filled 2014-09-24: qty 1

## 2014-09-24 MED ORDER — EPOETIN ALFA 10000 UNIT/ML IJ SOLN
30000.0000 [IU] | INTRAMUSCULAR | Status: DC
  Administered 2014-09-24: 10000 [IU] via SUBCUTANEOUS

## 2014-09-25 LAB — PTH, INTACT AND CALCIUM
Calcium, Total (PTH): 8.7 mg/dL (ref 8.6–10.2)
PTH: 77 pg/mL — AB (ref 15–65)

## 2014-09-25 LAB — TACROLIMUS LEVEL: Tacrolimus (FK506) - LabCorp: 5.8 ng/mL (ref 2.0–20.0)

## 2014-10-15 ENCOUNTER — Encounter (HOSPITAL_COMMUNITY)
Admission: RE | Admit: 2014-10-15 | Discharge: 2014-10-15 | Disposition: A | Discharge status: Home / Self Care | Payer: Medicare Other | Source: Ambulatory Visit | Attending: Nephrology | Admitting: Nephrology

## 2014-10-15 DIAGNOSIS — N2581 Secondary hyperparathyroidism of renal origin: Secondary | ICD-10-CM | POA: Diagnosis not present

## 2014-10-15 DIAGNOSIS — D631 Anemia in chronic kidney disease: Secondary | ICD-10-CM | POA: Diagnosis not present

## 2014-10-15 DIAGNOSIS — N183 Chronic kidney disease, stage 3 (moderate): Secondary | ICD-10-CM | POA: Diagnosis not present

## 2014-10-15 DIAGNOSIS — Z940 Kidney transplant status: Secondary | ICD-10-CM | POA: Diagnosis not present

## 2014-10-15 LAB — POCT HEMOGLOBIN-HEMACUE: HEMOGLOBIN: 9.3 g/dL — AB (ref 13.0–17.0)

## 2014-10-15 MED ORDER — EPOETIN ALFA 10000 UNIT/ML IJ SOLN: 30000.0000 [IU] | INTRAMUSCULAR | Status: DC

## 2014-10-15 MED ORDER — EPOETIN ALFA 10000 UNIT/ML IJ SOLN
INTRAMUSCULAR | Status: AC
  Administered 2014-10-15: 10000 [IU] via SUBCUTANEOUS
  Filled 2014-10-15: qty 1

## 2014-10-15 MED ORDER — EPOETIN ALFA 20000 UNIT/ML IJ SOLN
INTRAMUSCULAR | Status: AC
  Administered 2014-10-15: 20000 [IU] via SUBCUTANEOUS
  Filled 2014-10-15: qty 1

## 2014-11-05 ENCOUNTER — Encounter (HOSPITAL_COMMUNITY)
Admission: RE | Admit: 2014-11-05 | Discharge: 2014-11-05 | Disposition: A | Discharge status: Home / Self Care | Payer: Medicare Other | Source: Ambulatory Visit | Attending: Nephrology | Admitting: Nephrology

## 2014-11-05 DIAGNOSIS — N2581 Secondary hyperparathyroidism of renal origin: Secondary | ICD-10-CM | POA: Insufficient documentation

## 2014-11-05 DIAGNOSIS — D631 Anemia in chronic kidney disease: Secondary | ICD-10-CM | POA: Insufficient documentation

## 2014-11-05 DIAGNOSIS — N183 Chronic kidney disease, stage 3 (moderate): Secondary | ICD-10-CM | POA: Diagnosis not present

## 2014-11-05 DIAGNOSIS — Z940 Kidney transplant status: Secondary | ICD-10-CM | POA: Diagnosis not present

## 2014-11-05 LAB — POCT HEMOGLOBIN-HEMACUE: Hemoglobin: 10.1 g/dL — ABNORMAL LOW (ref 13.0–17.0)

## 2014-11-05 MED ORDER — EPOETIN ALFA 10000 UNIT/ML IJ SOLN
INTRAMUSCULAR | Status: AC
  Administered 2014-11-05: 10000 [IU] via SUBCUTANEOUS
  Filled 2014-11-05: qty 1

## 2014-11-05 MED ORDER — EPOETIN ALFA 20000 UNIT/ML IJ SOLN
INTRAMUSCULAR | Status: AC
  Administered 2014-11-05: 20000 [IU] via SUBCUTANEOUS
  Filled 2014-11-05: qty 1

## 2014-11-05 MED ORDER — EPOETIN ALFA 10000 UNIT/ML IJ SOLN: 30000.0000 [IU] | INTRAMUSCULAR | Status: DC

## 2014-11-12 ENCOUNTER — Encounter (HOSPITAL_COMMUNITY): Payer: Self-pay

## 2014-11-26 ENCOUNTER — Encounter (HOSPITAL_COMMUNITY)
Admission: RE | Admit: 2014-11-26 | Discharge: 2014-11-26 | Disposition: A | Discharge status: Home / Self Care | Payer: Medicare Other | Source: Ambulatory Visit | Attending: Nephrology | Admitting: Nephrology

## 2014-11-26 DIAGNOSIS — D631 Anemia in chronic kidney disease: Secondary | ICD-10-CM | POA: Diagnosis not present

## 2014-11-26 DIAGNOSIS — N183 Chronic kidney disease, stage 3 (moderate): Secondary | ICD-10-CM | POA: Diagnosis not present

## 2014-11-26 DIAGNOSIS — Z940 Kidney transplant status: Secondary | ICD-10-CM | POA: Diagnosis not present

## 2014-11-26 DIAGNOSIS — N2581 Secondary hyperparathyroidism of renal origin: Secondary | ICD-10-CM | POA: Diagnosis not present

## 2014-11-26 LAB — POCT HEMOGLOBIN-HEMACUE: HEMOGLOBIN: 9 g/dL — AB (ref 13.0–17.0)

## 2014-11-26 MED ORDER — EPOETIN ALFA 10000 UNIT/ML IJ SOLN
INTRAMUSCULAR | Status: AC
  Administered 2014-11-26: 10000 [IU] via SUBCUTANEOUS
  Filled 2014-11-26: qty 1

## 2014-11-26 MED ORDER — EPOETIN ALFA 20000 UNIT/ML IJ SOLN
INTRAMUSCULAR | Status: AC
  Administered 2014-11-26: 20000 [IU] via SUBCUTANEOUS
  Filled 2014-11-26: qty 1

## 2014-11-26 MED ORDER — EPOETIN ALFA 10000 UNIT/ML IJ SOLN: 30000.0000 [IU] | INTRAMUSCULAR | Status: DC

## 2014-12-17 ENCOUNTER — Inpatient Hospital Stay (HOSPITAL_COMMUNITY): Admission: RE | Admit: 2014-12-17 | Payer: Self-pay | Source: Ambulatory Visit

## 2014-12-20 ENCOUNTER — Encounter (HOSPITAL_COMMUNITY)
Admission: RE | Admit: 2014-12-20 | Discharge: 2014-12-20 | Disposition: A | Discharge status: Home / Self Care | Payer: Medicare Other | Source: Ambulatory Visit | Attending: Nephrology | Admitting: Nephrology

## 2014-12-20 DIAGNOSIS — Z940 Kidney transplant status: Secondary | ICD-10-CM | POA: Diagnosis not present

## 2014-12-20 DIAGNOSIS — N2581 Secondary hyperparathyroidism of renal origin: Secondary | ICD-10-CM | POA: Insufficient documentation

## 2014-12-20 DIAGNOSIS — D631 Anemia in chronic kidney disease: Secondary | ICD-10-CM | POA: Diagnosis not present

## 2014-12-20 DIAGNOSIS — N183 Chronic kidney disease, stage 3 (moderate): Secondary | ICD-10-CM | POA: Diagnosis not present

## 2014-12-20 LAB — FERRITIN: Ferritin: 502 ng/mL — ABNORMAL HIGH (ref 24–336)

## 2014-12-20 LAB — COMPREHENSIVE METABOLIC PANEL
ALT: 15 U/L — ABNORMAL LOW (ref 17–63)
ANION GAP: 6 (ref 5–15)
AST: 28 U/L (ref 15–41)
Albumin: 3.1 g/dL — ABNORMAL LOW (ref 3.5–5.0)
Alkaline Phosphatase: 68 U/L (ref 38–126)
BUN: 37 mg/dL — AB (ref 6–20)
CHLORIDE: 108 mmol/L (ref 101–111)
CO2: 23 mmol/L (ref 22–32)
Calcium: 9.2 mg/dL (ref 8.9–10.3)
Creatinine, Ser: 3.18 mg/dL — ABNORMAL HIGH (ref 0.61–1.24)
GFR, EST AFRICAN AMERICAN: 22 mL/min — AB (ref >60)
GFR, EST NON AFRICAN AMERICAN: 19 mL/min — AB (ref >60)
Glucose, Bld: 104 mg/dL — ABNORMAL HIGH (ref 65–99)
POTASSIUM: 4.2 mmol/L (ref 3.5–5.1)
Sodium: 137 mmol/L (ref 135–145)
Total Bilirubin: 0.6 mg/dL (ref 0.3–1.2)
Total Protein: 9 g/dL — ABNORMAL HIGH (ref 6.5–8.1)

## 2014-12-20 LAB — CBC
HCT: 28.6 % — ABNORMAL LOW (ref 39.0–52.0)
Hemoglobin: 9.4 g/dL — ABNORMAL LOW (ref 13.0–17.0)
MCH: 28.2 pg (ref 26.0–34.0)
MCHC: 32.9 g/dL (ref 30.0–36.0)
MCV: 85.9 fL (ref 78.0–100.0)
PLATELETS: 222 10*3/uL (ref 150–400)
RBC: 3.33 MIL/uL — ABNORMAL LOW (ref 4.22–5.81)
RDW: 13.7 % (ref 11.5–15.5)
WBC: 5.7 10*3/uL (ref 4.0–10.5)

## 2014-12-20 LAB — URINALYSIS, ROUTINE W REFLEX MICROSCOPIC
BILIRUBIN URINE: NEGATIVE
Glucose, UA: NEGATIVE mg/dL
HGB URINE DIPSTICK: NEGATIVE
KETONES UR: NEGATIVE mg/dL
NITRITE: NEGATIVE
PROTEIN: 30 mg/dL — AB
Specific Gravity, Urine: 1.013 (ref 1.005–1.030)
UROBILINOGEN UA: 1 mg/dL (ref 0.0–1.0)
pH: 6 (ref 5.0–8.0)

## 2014-12-20 LAB — LIPID PANEL
CHOL/HDL RATIO: 3.4 ratio
CHOLESTEROL: 162 mg/dL (ref 0–200)
HDL: 47 mg/dL (ref >40)
LDL CALC: 85 mg/dL (ref 0–99)
Triglycerides: 149 mg/dL (ref <150)
VLDL: 30 mg/dL (ref 0–40)

## 2014-12-20 LAB — IRON AND TIBC
Iron: 91 ug/dL (ref 45–182)
SATURATION RATIOS: 50 % — AB (ref 17.9–39.5)
TIBC: 182 ug/dL — AB (ref 250–450)
UIBC: 91 ug/dL

## 2014-12-20 LAB — PHOSPHORUS: PHOSPHORUS: 3.6 mg/dL (ref 2.5–4.6)

## 2014-12-20 LAB — URINE MICROSCOPIC-ADD ON

## 2014-12-20 LAB — MAGNESIUM: MAGNESIUM: 1.5 mg/dL — AB (ref 1.7–2.4)

## 2014-12-20 MED ORDER — EPOETIN ALFA 10000 UNIT/ML IJ SOLN
INTRAMUSCULAR | Status: AC
  Administered 2014-12-20: 10000 [IU] via SUBCUTANEOUS
  Filled 2014-12-20: qty 1

## 2014-12-20 MED ORDER — EPOETIN ALFA 20000 UNIT/ML IJ SOLN
INTRAMUSCULAR | Status: AC
  Administered 2014-12-20: 20000 [IU] via SUBCUTANEOUS
  Filled 2014-12-20: qty 1

## 2014-12-20 MED ORDER — EPOETIN ALFA 10000 UNIT/ML IJ SOLN: 30000.0000 [IU] | INTRAMUSCULAR | Status: DC

## 2014-12-21 LAB — TACROLIMUS LEVEL: Tacrolimus (FK506) - LabCorp: 10.5 ng/mL (ref 2.0–20.0)

## 2014-12-21 LAB — PTH, INTACT AND CALCIUM
CALCIUM TOTAL (PTH): 8.8 mg/dL (ref 8.6–10.2)
PTH: 86 pg/mL — ABNORMAL HIGH (ref 15–65)

## 2014-12-23 DIAGNOSIS — D899 Disorder involving the immune mechanism, unspecified: Secondary | ICD-10-CM | POA: Diagnosis not present

## 2014-12-23 DIAGNOSIS — D631 Anemia in chronic kidney disease: Secondary | ICD-10-CM | POA: Diagnosis not present

## 2014-12-23 DIAGNOSIS — Z940 Kidney transplant status: Secondary | ICD-10-CM | POA: Diagnosis not present

## 2014-12-23 DIAGNOSIS — N2581 Secondary hyperparathyroidism of renal origin: Secondary | ICD-10-CM | POA: Diagnosis not present

## 2014-12-23 DIAGNOSIS — I129 Hypertensive chronic kidney disease with stage 1 through stage 4 chronic kidney disease, or unspecified chronic kidney disease: Secondary | ICD-10-CM | POA: Diagnosis not present

## 2014-12-23 DIAGNOSIS — E872 Acidosis: Secondary | ICD-10-CM | POA: Diagnosis not present

## 2014-12-23 DIAGNOSIS — N189 Chronic kidney disease, unspecified: Secondary | ICD-10-CM | POA: Diagnosis not present

## 2014-12-23 DIAGNOSIS — N529 Male erectile dysfunction, unspecified: Secondary | ICD-10-CM | POA: Diagnosis not present

## 2014-12-23 DIAGNOSIS — M109 Gout, unspecified: Secondary | ICD-10-CM | POA: Diagnosis not present

## 2015-01-12 DIAGNOSIS — H40033 Anatomical narrow angle, bilateral: Secondary | ICD-10-CM | POA: Diagnosis not present

## 2015-01-12 DIAGNOSIS — H43393 Other vitreous opacities, bilateral: Secondary | ICD-10-CM | POA: Diagnosis not present

## 2015-01-14 ENCOUNTER — Encounter (HOSPITAL_COMMUNITY)
Admission: RE | Admit: 2015-01-14 | Discharge: 2015-01-14 | Disposition: A | Discharge status: Home / Self Care | Payer: Medicare Other | Source: Ambulatory Visit | Attending: Nephrology | Admitting: Nephrology

## 2015-01-14 DIAGNOSIS — N2581 Secondary hyperparathyroidism of renal origin: Secondary | ICD-10-CM | POA: Diagnosis not present

## 2015-01-14 DIAGNOSIS — Z940 Kidney transplant status: Secondary | ICD-10-CM | POA: Insufficient documentation

## 2015-01-14 DIAGNOSIS — D631 Anemia in chronic kidney disease: Secondary | ICD-10-CM | POA: Diagnosis not present

## 2015-01-14 DIAGNOSIS — N183 Chronic kidney disease, stage 3 (moderate): Secondary | ICD-10-CM | POA: Diagnosis not present

## 2015-01-14 LAB — RENAL FUNCTION PANEL
ANION GAP: 3 — AB (ref 5–15)
Albumin: 3.1 g/dL — ABNORMAL LOW (ref 3.5–5.0)
BUN: 33 mg/dL — ABNORMAL HIGH (ref 6–20)
CALCIUM: 8.9 mg/dL (ref 8.9–10.3)
CO2: 22 mmol/L (ref 22–32)
Chloride: 111 mmol/L (ref 101–111)
Creatinine, Ser: 2.6 mg/dL — ABNORMAL HIGH (ref 0.61–1.24)
GFR calc non Af Amer: 24 mL/min — ABNORMAL LOW (ref >60)
GFR, EST AFRICAN AMERICAN: 28 mL/min — AB (ref >60)
Glucose, Bld: 82 mg/dL (ref 65–99)
Phosphorus: 2.7 mg/dL (ref 2.5–4.6)
Potassium: 4.5 mmol/L (ref 3.5–5.1)
SODIUM: 136 mmol/L (ref 135–145)

## 2015-01-14 LAB — URIC ACID: Uric Acid, Serum: 8.6 mg/dL — ABNORMAL HIGH (ref 4.4–7.6)

## 2015-01-14 LAB — POCT HEMOGLOBIN-HEMACUE: HEMOGLOBIN: 9.4 g/dL — AB (ref 13.0–17.0)

## 2015-01-14 MED ORDER — EPOETIN ALFA 10000 UNIT/ML IJ SOLN: 30000.0000 [IU] | INTRAMUSCULAR | Status: DC

## 2015-01-14 MED ORDER — EPOETIN ALFA 20000 UNIT/ML IJ SOLN
INTRAMUSCULAR | Status: AC
  Administered 2015-01-14: 20000 [IU] via SUBCUTANEOUS
  Filled 2015-01-14: qty 1

## 2015-01-14 MED ORDER — EPOETIN ALFA 10000 UNIT/ML IJ SOLN
INTRAMUSCULAR | Status: AC
  Administered 2015-01-14: 10000 [IU] via SUBCUTANEOUS
  Filled 2015-01-14: qty 1

## 2015-01-16 LAB — TACROLIMUS LEVEL: Tacrolimus (FK506) - LabCorp: 2.7 ng/mL (ref 2.0–20.0)

## 2015-01-18 DIAGNOSIS — H402232 Chronic angle-closure glaucoma, bilateral, moderate stage: Secondary | ICD-10-CM | POA: Diagnosis not present

## 2015-01-18 DIAGNOSIS — H3552 Pigmentary retinal dystrophy: Secondary | ICD-10-CM | POA: Diagnosis not present

## 2015-02-04 ENCOUNTER — Encounter (HOSPITAL_COMMUNITY)
Admission: RE | Admit: 2015-02-04 | Discharge: 2015-02-04 | Disposition: A | Discharge status: Home / Self Care | Payer: Medicare Other | Source: Ambulatory Visit | Attending: Nephrology | Admitting: Nephrology

## 2015-02-04 DIAGNOSIS — Z940 Kidney transplant status: Secondary | ICD-10-CM | POA: Insufficient documentation

## 2015-02-04 DIAGNOSIS — N2581 Secondary hyperparathyroidism of renal origin: Secondary | ICD-10-CM | POA: Diagnosis not present

## 2015-02-04 DIAGNOSIS — D631 Anemia in chronic kidney disease: Secondary | ICD-10-CM | POA: Diagnosis not present

## 2015-02-04 DIAGNOSIS — N183 Chronic kidney disease, stage 3 (moderate): Secondary | ICD-10-CM | POA: Insufficient documentation

## 2015-02-04 LAB — POCT HEMOGLOBIN-HEMACUE: HEMOGLOBIN: 9.5 g/dL — AB (ref 13.0–17.0)

## 2015-02-04 MED ORDER — EPOETIN ALFA 10000 UNIT/ML IJ SOLN
INTRAMUSCULAR | Status: AC
  Administered 2015-02-04: 10000 [IU] via SUBCUTANEOUS
  Filled 2015-02-04: qty 1

## 2015-02-04 MED ORDER — EPOETIN ALFA 20000 UNIT/ML IJ SOLN
INTRAMUSCULAR | Status: AC
  Administered 2015-02-04: 20000 [IU] via SUBCUTANEOUS
  Filled 2015-02-04: qty 1

## 2015-02-04 MED ORDER — EPOETIN ALFA 10000 UNIT/ML IJ SOLN: 30000.0000 [IU] | INTRAMUSCULAR | Status: DC

## 2015-02-24 ENCOUNTER — Encounter (HOSPITAL_COMMUNITY)
Admission: RE | Admit: 2015-02-24 | Discharge: 2015-02-24 | Disposition: A | Discharge status: Home / Self Care | Payer: Medicare Other | Source: Ambulatory Visit | Attending: Nephrology | Admitting: Nephrology

## 2015-02-24 DIAGNOSIS — Z940 Kidney transplant status: Secondary | ICD-10-CM | POA: Diagnosis not present

## 2015-02-24 DIAGNOSIS — N183 Chronic kidney disease, stage 3 (moderate): Secondary | ICD-10-CM | POA: Diagnosis not present

## 2015-02-24 DIAGNOSIS — N2581 Secondary hyperparathyroidism of renal origin: Secondary | ICD-10-CM | POA: Diagnosis not present

## 2015-02-24 DIAGNOSIS — D631 Anemia in chronic kidney disease: Secondary | ICD-10-CM | POA: Diagnosis not present

## 2015-02-24 LAB — POCT HEMOGLOBIN-HEMACUE: HEMOGLOBIN: 10.1 g/dL — AB (ref 13.0–17.0)

## 2015-02-24 MED ORDER — EPOETIN ALFA 20000 UNIT/ML IJ SOLN
INTRAMUSCULAR | Status: AC
  Administered 2015-02-24: 20000 [IU] via SUBCUTANEOUS
  Filled 2015-02-24: qty 1

## 2015-02-24 MED ORDER — EPOETIN ALFA 10000 UNIT/ML IJ SOLN
INTRAMUSCULAR | Status: AC
  Administered 2015-02-24: 10000 [IU] via SUBCUTANEOUS
  Filled 2015-02-24: qty 1

## 2015-02-24 MED ORDER — EPOETIN ALFA 10000 UNIT/ML IJ SOLN: 30000.0000 [IU] | INTRAMUSCULAR | Status: DC

## 2015-02-25 ENCOUNTER — Encounter (HOSPITAL_COMMUNITY): Payer: Self-pay

## 2015-03-17 ENCOUNTER — Inpatient Hospital Stay (HOSPITAL_COMMUNITY): Admission: RE | Admit: 2015-03-17 | Payer: Self-pay | Source: Ambulatory Visit

## 2015-03-18 ENCOUNTER — Encounter (HOSPITAL_COMMUNITY)
Admission: RE | Admit: 2015-03-18 | Discharge: 2015-03-18 | Disposition: A | Discharge status: Home / Self Care | Payer: Medicare Other | Source: Ambulatory Visit | Attending: Nephrology | Admitting: Nephrology

## 2015-03-18 DIAGNOSIS — D631 Anemia in chronic kidney disease: Secondary | ICD-10-CM | POA: Insufficient documentation

## 2015-03-18 DIAGNOSIS — Z940 Kidney transplant status: Secondary | ICD-10-CM | POA: Insufficient documentation

## 2015-03-18 DIAGNOSIS — N183 Chronic kidney disease, stage 3 (moderate): Secondary | ICD-10-CM | POA: Diagnosis not present

## 2015-03-18 DIAGNOSIS — N2581 Secondary hyperparathyroidism of renal origin: Secondary | ICD-10-CM | POA: Insufficient documentation

## 2015-03-18 LAB — URINALYSIS, ROUTINE W REFLEX MICROSCOPIC
Bilirubin Urine: NEGATIVE
Glucose, UA: NEGATIVE mg/dL
Hgb urine dipstick: NEGATIVE
KETONES UR: NEGATIVE mg/dL
NITRITE: NEGATIVE
PROTEIN: 30 mg/dL — AB
Specific Gravity, Urine: 1.013 (ref 1.005–1.030)
pH: 6 (ref 5.0–8.0)

## 2015-03-18 LAB — URINE MICROSCOPIC-ADD ON: RBC / HPF: NONE SEEN RBC/hpf (ref 0–5)

## 2015-03-18 LAB — LIPID PANEL
Cholesterol: 143 mg/dL (ref 0–200)
HDL: 45 mg/dL (ref >40)
LDL CALC: 72 mg/dL (ref 0–99)
Total CHOL/HDL Ratio: 3.2 RATIO
Triglycerides: 132 mg/dL (ref <150)
VLDL: 26 mg/dL (ref 0–40)

## 2015-03-18 LAB — COMPREHENSIVE METABOLIC PANEL
ALBUMIN: 2.8 g/dL — AB (ref 3.5–5.0)
ALT: 10 U/L — ABNORMAL LOW (ref 17–63)
ANION GAP: 7 (ref 5–15)
AST: 18 U/L (ref 15–41)
Alkaline Phosphatase: 65 U/L (ref 38–126)
BILIRUBIN TOTAL: 0.7 mg/dL (ref 0.3–1.2)
BUN: 31 mg/dL — ABNORMAL HIGH (ref 6–20)
CO2: 24 mmol/L (ref 22–32)
Calcium: 9 mg/dL (ref 8.9–10.3)
Chloride: 109 mmol/L (ref 101–111)
Creatinine, Ser: 3.1 mg/dL — ABNORMAL HIGH (ref 0.61–1.24)
GFR calc non Af Amer: 19 mL/min — ABNORMAL LOW (ref >60)
GFR, EST AFRICAN AMERICAN: 22 mL/min — AB (ref >60)
GLUCOSE: 111 mg/dL — AB (ref 65–99)
POTASSIUM: 4.3 mmol/L (ref 3.5–5.1)
SODIUM: 140 mmol/L (ref 135–145)
TOTAL PROTEIN: 8.1 g/dL (ref 6.5–8.1)

## 2015-03-18 LAB — MAGNESIUM: MAGNESIUM: 1.4 mg/dL — AB (ref 1.7–2.4)

## 2015-03-18 LAB — IRON AND TIBC
IRON: 31 ug/dL — AB (ref 45–182)
Saturation Ratios: 20 % (ref 17.9–39.5)
TIBC: 157 ug/dL — AB (ref 250–450)
UIBC: 126 ug/dL

## 2015-03-18 LAB — CBC
HCT: 30.5 % — ABNORMAL LOW (ref 39.0–52.0)
Hemoglobin: 10.2 g/dL — ABNORMAL LOW (ref 13.0–17.0)
MCH: 29.2 pg (ref 26.0–34.0)
MCHC: 33.4 g/dL (ref 30.0–36.0)
MCV: 87.4 fL (ref 78.0–100.0)
Platelets: 203 10*3/uL (ref 150–400)
RBC: 3.49 MIL/uL — ABNORMAL LOW (ref 4.22–5.81)
RDW: 13.8 % (ref 11.5–15.5)
WBC: 14.8 10*3/uL — ABNORMAL HIGH (ref 4.0–10.5)

## 2015-03-18 LAB — PHOSPHORUS: Phosphorus: 2.4 mg/dL — ABNORMAL LOW (ref 2.5–4.6)

## 2015-03-18 LAB — FERRITIN: FERRITIN: 435 ng/mL — AB (ref 24–336)

## 2015-03-18 MED ORDER — EPOETIN ALFA 10000 UNIT/ML IJ SOLN
INTRAMUSCULAR | Status: AC
  Administered 2015-03-18: 10000 [IU]
  Filled 2015-03-18: qty 1

## 2015-03-18 MED ORDER — EPOETIN ALFA 20000 UNIT/ML IJ SOLN
INTRAMUSCULAR | Status: AC
  Administered 2015-03-18: 20000 [IU]
  Filled 2015-03-18: qty 1

## 2015-03-18 MED ORDER — EPOETIN ALFA 10000 UNIT/ML IJ SOLN: 30000.0000 [IU] | INTRAMUSCULAR | Status: DC

## 2015-03-19 LAB — PTH, INTACT AND CALCIUM
Calcium, Total (PTH): 8.9 mg/dL (ref 8.6–10.2)
PTH: 86 pg/mL — ABNORMAL HIGH (ref 15–65)

## 2015-04-07 ENCOUNTER — Other Ambulatory Visit (HOSPITAL_COMMUNITY): Payer: Self-pay | Admitting: *Deleted

## 2015-04-08 ENCOUNTER — Encounter (HOSPITAL_COMMUNITY): Payer: Self-pay

## 2015-04-08 ENCOUNTER — Encounter (HOSPITAL_COMMUNITY)
Admission: RE | Admit: 2015-04-08 | Discharge: 2015-04-08 | Disposition: A | Discharge status: Home / Self Care | Payer: Medicare Other | Source: Ambulatory Visit | Attending: Nephrology | Admitting: Nephrology

## 2015-04-08 DIAGNOSIS — N2581 Secondary hyperparathyroidism of renal origin: Secondary | ICD-10-CM | POA: Diagnosis not present

## 2015-04-08 DIAGNOSIS — N183 Chronic kidney disease, stage 3 (moderate): Secondary | ICD-10-CM | POA: Diagnosis not present

## 2015-04-08 DIAGNOSIS — Z940 Kidney transplant status: Secondary | ICD-10-CM | POA: Diagnosis not present

## 2015-04-08 DIAGNOSIS — D631 Anemia in chronic kidney disease: Secondary | ICD-10-CM | POA: Insufficient documentation

## 2015-04-08 MED ORDER — SODIUM CHLORIDE 0.9 % IV SOLN
510.0000 mg | INTRAVENOUS | Status: DC
  Administered 2015-04-08: 510 mg via INTRAVENOUS
  Filled 2015-04-08: qty 17

## 2015-04-08 MED ORDER — EPOETIN ALFA 10000 UNIT/ML IJ SOLN
INTRAMUSCULAR | Status: AC
  Administered 2015-04-08: 10000 [IU] via SUBCUTANEOUS
  Filled 2015-04-08: qty 1

## 2015-04-08 MED ORDER — EPOETIN ALFA 10000 UNIT/ML IJ SOLN: 30000.0000 [IU] | INTRAMUSCULAR | Status: DC

## 2015-04-08 MED ORDER — EPOETIN ALFA 20000 UNIT/ML IJ SOLN
INTRAMUSCULAR | Status: AC
Start: 2015-04-08 — End: 2015-04-08
  Administered 2015-04-08: 20000 [IU] via SUBCUTANEOUS
  Filled 2015-04-08: qty 1

## 2015-04-11 LAB — POCT HEMOGLOBIN-HEMACUE: Hemoglobin: 9.6 g/dL — ABNORMAL LOW (ref 13.0–17.0)

## 2015-04-14 ENCOUNTER — Ambulatory Visit (HOSPITAL_COMMUNITY)
Admission: RE | Admit: 2015-04-14 | Discharge: 2015-04-14 | Disposition: A | Discharge status: Home / Self Care | Payer: Medicare Other | Source: Ambulatory Visit | Attending: Nephrology | Admitting: Nephrology

## 2015-04-14 DIAGNOSIS — D509 Iron deficiency anemia, unspecified: Secondary | ICD-10-CM | POA: Insufficient documentation

## 2015-04-14 MED ORDER — SODIUM CHLORIDE 0.9 % IV SOLN
510.0000 mg | INTRAVENOUS | Status: DC
  Administered 2015-04-14: 510 mg via INTRAVENOUS
  Filled 2015-04-14: qty 17

## 2015-04-19 DIAGNOSIS — N189 Chronic kidney disease, unspecified: Secondary | ICD-10-CM | POA: Diagnosis not present

## 2015-04-19 DIAGNOSIS — D899 Disorder involving the immune mechanism, unspecified: Secondary | ICD-10-CM | POA: Diagnosis not present

## 2015-04-19 DIAGNOSIS — E872 Acidosis: Secondary | ICD-10-CM | POA: Diagnosis not present

## 2015-04-19 DIAGNOSIS — N2581 Secondary hyperparathyroidism of renal origin: Secondary | ICD-10-CM | POA: Diagnosis not present

## 2015-04-19 DIAGNOSIS — Z940 Kidney transplant status: Secondary | ICD-10-CM | POA: Diagnosis not present

## 2015-04-19 DIAGNOSIS — D631 Anemia in chronic kidney disease: Secondary | ICD-10-CM | POA: Diagnosis not present

## 2015-04-19 DIAGNOSIS — N529 Male erectile dysfunction, unspecified: Secondary | ICD-10-CM | POA: Diagnosis not present

## 2015-04-19 DIAGNOSIS — M109 Gout, unspecified: Secondary | ICD-10-CM | POA: Diagnosis not present

## 2015-04-19 DIAGNOSIS — I129 Hypertensive chronic kidney disease with stage 1 through stage 4 chronic kidney disease, or unspecified chronic kidney disease: Secondary | ICD-10-CM | POA: Diagnosis not present

## 2015-04-28 ENCOUNTER — Other Ambulatory Visit (HOSPITAL_COMMUNITY): Payer: Self-pay | Admitting: *Deleted

## 2015-04-29 ENCOUNTER — Encounter (HOSPITAL_COMMUNITY)
Admission: RE | Admit: 2015-04-29 | Discharge: 2015-04-29 | Disposition: A | Discharge status: Home / Self Care | Payer: Medicare Other | Source: Ambulatory Visit | Attending: Nephrology | Admitting: Nephrology

## 2015-04-29 DIAGNOSIS — N183 Chronic kidney disease, stage 3 (moderate): Secondary | ICD-10-CM | POA: Diagnosis not present

## 2015-04-29 DIAGNOSIS — N2581 Secondary hyperparathyroidism of renal origin: Secondary | ICD-10-CM | POA: Diagnosis not present

## 2015-04-29 DIAGNOSIS — Z940 Kidney transplant status: Secondary | ICD-10-CM | POA: Diagnosis not present

## 2015-04-29 DIAGNOSIS — D631 Anemia in chronic kidney disease: Secondary | ICD-10-CM | POA: Diagnosis not present

## 2015-04-29 LAB — POCT HEMOGLOBIN-HEMACUE: Hemoglobin: 9.4 g/dL — ABNORMAL LOW (ref 13.0–17.0)

## 2015-04-29 MED ORDER — EPOETIN ALFA 10000 UNIT/ML IJ SOLN
INTRAMUSCULAR | Status: AC
  Filled 2015-04-29: qty 1

## 2015-04-29 MED ORDER — EPOETIN ALFA 10000 UNIT/ML IJ SOLN
30000.0000 [IU] | INTRAMUSCULAR | Status: DC
  Administered 2015-04-29: 10000 [IU] via SUBCUTANEOUS

## 2015-04-29 MED ORDER — EPOETIN ALFA 20000 UNIT/ML IJ SOLN
INTRAMUSCULAR | Status: AC
  Administered 2015-04-29: 20000 [IU]
  Filled 2015-04-29: qty 1

## 2015-05-20 ENCOUNTER — Inpatient Hospital Stay (HOSPITAL_COMMUNITY): Admission: RE | Admit: 2015-05-20 | Payer: Self-pay | Source: Ambulatory Visit

## 2015-05-26 ENCOUNTER — Encounter (HOSPITAL_COMMUNITY)
Admission: RE | Admit: 2015-05-26 | Discharge: 2015-05-26 | Disposition: A | Discharge status: Home / Self Care | Payer: Medicare Other | Source: Ambulatory Visit | Attending: Nephrology | Admitting: Nephrology

## 2015-05-26 DIAGNOSIS — N2581 Secondary hyperparathyroidism of renal origin: Secondary | ICD-10-CM | POA: Insufficient documentation

## 2015-05-26 DIAGNOSIS — N183 Chronic kidney disease, stage 3 (moderate): Secondary | ICD-10-CM | POA: Insufficient documentation

## 2015-05-26 DIAGNOSIS — Z940 Kidney transplant status: Secondary | ICD-10-CM | POA: Insufficient documentation

## 2015-05-26 DIAGNOSIS — D631 Anemia in chronic kidney disease: Secondary | ICD-10-CM | POA: Insufficient documentation

## 2015-05-26 LAB — POCT HEMOGLOBIN-HEMACUE: Hemoglobin: 8.2 g/dL — ABNORMAL LOW (ref 13.0–17.0)

## 2015-05-26 MED ORDER — EPOETIN ALFA 10000 UNIT/ML IJ SOLN: 30000.0000 [IU] | INTRAMUSCULAR | Status: DC

## 2015-05-26 MED ORDER — EPOETIN ALFA 10000 UNIT/ML IJ SOLN
INTRAMUSCULAR | Status: AC
Start: 2015-05-26 — End: 2015-05-26
  Administered 2015-05-26: 10000 [IU]
  Filled 2015-05-26: qty 1

## 2015-05-26 MED ORDER — EPOETIN ALFA 20000 UNIT/ML IJ SOLN
INTRAMUSCULAR | Status: AC
Start: 2015-05-26 — End: 2015-05-26
  Administered 2015-05-26: 20000 [IU]
  Filled 2015-05-26: qty 1

## 2015-06-16 ENCOUNTER — Encounter (HOSPITAL_COMMUNITY)
Admission: RE | Admit: 2015-06-16 | Discharge: 2015-06-16 | Disposition: A | Discharge status: Home / Self Care | Payer: Medicare Other | Source: Ambulatory Visit | Attending: Nephrology | Admitting: Nephrology

## 2015-06-16 DIAGNOSIS — Z940 Kidney transplant status: Secondary | ICD-10-CM | POA: Insufficient documentation

## 2015-06-16 DIAGNOSIS — N2581 Secondary hyperparathyroidism of renal origin: Secondary | ICD-10-CM | POA: Insufficient documentation

## 2015-06-16 DIAGNOSIS — N183 Chronic kidney disease, stage 3 (moderate): Secondary | ICD-10-CM | POA: Insufficient documentation

## 2015-06-16 DIAGNOSIS — D631 Anemia in chronic kidney disease: Secondary | ICD-10-CM | POA: Diagnosis not present

## 2015-06-16 LAB — URINE MICROSCOPIC-ADD ON: RBC / HPF: NONE SEEN RBC/hpf (ref 0–5)

## 2015-06-16 LAB — URINALYSIS, ROUTINE W REFLEX MICROSCOPIC
Bilirubin Urine: NEGATIVE
GLUCOSE, UA: NEGATIVE mg/dL
Hgb urine dipstick: NEGATIVE
KETONES UR: NEGATIVE mg/dL
LEUKOCYTES UA: NEGATIVE
NITRITE: NEGATIVE
PH: 6 (ref 5.0–8.0)
Protein, ur: 30 mg/dL — AB
SPECIFIC GRAVITY, URINE: 1.014 (ref 1.005–1.030)

## 2015-06-16 LAB — IRON AND TIBC
IRON: 81 ug/dL (ref 45–182)
Saturation Ratios: 48 % — ABNORMAL HIGH (ref 17.9–39.5)
TIBC: 169 ug/dL — AB (ref 250–450)
UIBC: 88 ug/dL

## 2015-06-16 LAB — COMPREHENSIVE METABOLIC PANEL
ALBUMIN: 3.2 g/dL — AB (ref 3.5–5.0)
ALK PHOS: 75 U/L (ref 38–126)
ALT: 13 U/L — ABNORMAL LOW (ref 17–63)
ANION GAP: 9 (ref 5–15)
AST: 32 U/L (ref 15–41)
BILIRUBIN TOTAL: 0.6 mg/dL (ref 0.3–1.2)
BUN: 31 mg/dL — ABNORMAL HIGH (ref 6–20)
CALCIUM: 8.8 mg/dL — AB (ref 8.9–10.3)
CO2: 19 mmol/L — ABNORMAL LOW (ref 22–32)
Chloride: 112 mmol/L — ABNORMAL HIGH (ref 101–111)
Creatinine, Ser: 2.72 mg/dL — ABNORMAL HIGH (ref 0.61–1.24)
GFR calc non Af Amer: 23 mL/min — ABNORMAL LOW (ref >60)
GFR, EST AFRICAN AMERICAN: 26 mL/min — AB (ref >60)
Glucose, Bld: 106 mg/dL — ABNORMAL HIGH (ref 65–99)
POTASSIUM: 5.5 mmol/L — AB (ref 3.5–5.1)
Sodium: 140 mmol/L (ref 135–145)
TOTAL PROTEIN: 8.3 g/dL — AB (ref 6.5–8.1)

## 2015-06-16 LAB — LIPID PANEL
Cholesterol: 134 mg/dL (ref 0–200)
HDL: 46 mg/dL (ref >40)
LDL Cholesterol: 37 mg/dL (ref 0–99)
TRIGLYCERIDES: 254 mg/dL — AB (ref <150)
Total CHOL/HDL Ratio: 2.9 RATIO
VLDL: 51 mg/dL — AB (ref 0–40)

## 2015-06-16 LAB — CBC
HEMATOCRIT: 28.5 % — AB (ref 39.0–52.0)
HEMOGLOBIN: 9.5 g/dL — AB (ref 13.0–17.0)
MCH: 29.1 pg (ref 26.0–34.0)
MCHC: 33.3 g/dL (ref 30.0–36.0)
MCV: 87.4 fL (ref 78.0–100.0)
Platelets: 232 10*3/uL (ref 150–400)
RBC: 3.26 MIL/uL — ABNORMAL LOW (ref 4.22–5.81)
RDW: 14.2 % (ref 11.5–15.5)
WBC: 8.8 10*3/uL (ref 4.0–10.5)

## 2015-06-16 LAB — MAGNESIUM: MAGNESIUM: 1.6 mg/dL — AB (ref 1.7–2.4)

## 2015-06-16 LAB — FERRITIN: FERRITIN: 517 ng/mL — AB (ref 24–336)

## 2015-06-16 LAB — PHOSPHORUS: PHOSPHORUS: 2.6 mg/dL (ref 2.5–4.6)

## 2015-06-16 MED ORDER — EPOETIN ALFA 10000 UNIT/ML IJ SOLN
30000.0000 [IU] | INTRAMUSCULAR | Status: DC
  Administered 2015-06-16: 30000 [IU] via SUBCUTANEOUS

## 2015-06-16 MED ORDER — EPOETIN ALFA 20000 UNIT/ML IJ SOLN
INTRAMUSCULAR | Status: AC
  Administered 2015-06-16: 20000 [IU]
  Filled 2015-06-16: qty 1

## 2015-06-16 MED ORDER — EPOETIN ALFA 10000 UNIT/ML IJ SOLN
INTRAMUSCULAR | Status: AC
  Administered 2015-06-16: 30000 [IU] via SUBCUTANEOUS
  Filled 2015-06-16: qty 1

## 2015-06-17 LAB — PTH, INTACT AND CALCIUM
Calcium, Total (PTH): 8.7 mg/dL (ref 8.6–10.2)
PTH: 106 pg/mL — AB (ref 15–65)

## 2015-06-17 LAB — POCT HEMOGLOBIN-HEMACUE: Hemoglobin: 9.6 g/dL — ABNORMAL LOW (ref 13.0–17.0)

## 2015-07-07 ENCOUNTER — Inpatient Hospital Stay (HOSPITAL_COMMUNITY): Admission: RE | Admit: 2015-07-07 | Payer: Self-pay | Source: Ambulatory Visit

## 2015-07-14 ENCOUNTER — Other Ambulatory Visit (HOSPITAL_COMMUNITY): Payer: Self-pay | Admitting: *Deleted

## 2015-07-15 ENCOUNTER — Encounter (HOSPITAL_COMMUNITY)
Admission: RE | Admit: 2015-07-15 | Discharge: 2015-07-15 | Disposition: A | Discharge status: Home / Self Care | Payer: Medicare Other | Source: Ambulatory Visit | Attending: Nephrology | Admitting: Nephrology

## 2015-07-15 DIAGNOSIS — D638 Anemia in other chronic diseases classified elsewhere: Secondary | ICD-10-CM | POA: Diagnosis not present

## 2015-07-15 LAB — POCT HEMOGLOBIN-HEMACUE: Hemoglobin: 8.5 g/dL — ABNORMAL LOW (ref 13.0–17.0)

## 2015-07-15 MED ORDER — EPOETIN ALFA 10000 UNIT/ML IJ SOLN
INTRAMUSCULAR | Status: AC
  Administered 2015-07-15: 10000 [IU]
  Filled 2015-07-15: qty 1

## 2015-07-15 MED ORDER — EPOETIN ALFA 10000 UNIT/ML IJ SOLN: 30000.0000 [IU] | INTRAMUSCULAR | Status: DC

## 2015-07-15 MED ORDER — EPOETIN ALFA 20000 UNIT/ML IJ SOLN
INTRAMUSCULAR | Status: AC
  Administered 2015-07-15: 20000 [IU]
  Filled 2015-07-15: qty 1

## 2015-07-18 LAB — TACROLIMUS LEVEL: Tacrolimus (FK506) - LabCorp: 1.9 ng/mL — ABNORMAL LOW (ref 2.0–20.0)

## 2015-08-05 ENCOUNTER — Inpatient Hospital Stay (HOSPITAL_COMMUNITY): Admission: RE | Admit: 2015-08-05 | Payer: Self-pay | Source: Ambulatory Visit

## 2015-08-08 ENCOUNTER — Encounter (HOSPITAL_COMMUNITY)
Admission: RE | Admit: 2015-08-08 | Discharge: 2015-08-08 | Disposition: A | Discharge status: Home / Self Care | Payer: Medicare Other | Source: Ambulatory Visit | Attending: Nephrology | Admitting: Nephrology

## 2015-08-08 DIAGNOSIS — N2581 Secondary hyperparathyroidism of renal origin: Secondary | ICD-10-CM | POA: Diagnosis not present

## 2015-08-08 DIAGNOSIS — Z940 Kidney transplant status: Secondary | ICD-10-CM | POA: Insufficient documentation

## 2015-08-08 DIAGNOSIS — N183 Chronic kidney disease, stage 3 (moderate): Secondary | ICD-10-CM | POA: Insufficient documentation

## 2015-08-08 DIAGNOSIS — D631 Anemia in chronic kidney disease: Secondary | ICD-10-CM | POA: Diagnosis not present

## 2015-08-08 LAB — RENAL FUNCTION PANEL
Albumin: 3.1 g/dL — ABNORMAL LOW (ref 3.5–5.0)
Anion gap: 5 (ref 5–15)
BUN: 44 mg/dL — ABNORMAL HIGH (ref 6–20)
CHLORIDE: 108 mmol/L (ref 101–111)
CO2: 22 mmol/L (ref 22–32)
CREATININE: 3.46 mg/dL — AB (ref 0.61–1.24)
Calcium: 8.9 mg/dL (ref 8.9–10.3)
GFR, EST AFRICAN AMERICAN: 20 mL/min — AB (ref >60)
GFR, EST NON AFRICAN AMERICAN: 17 mL/min — AB (ref >60)
Glucose, Bld: 105 mg/dL — ABNORMAL HIGH (ref 65–99)
POTASSIUM: 4.3 mmol/L (ref 3.5–5.1)
Phosphorus: 3.7 mg/dL (ref 2.5–4.6)
Sodium: 135 mmol/L (ref 135–145)

## 2015-08-08 LAB — POCT HEMOGLOBIN-HEMACUE: Hemoglobin: 9.6 g/dL — ABNORMAL LOW (ref 13.0–17.0)

## 2015-08-08 MED ORDER — EPOETIN ALFA 10000 UNIT/ML IJ SOLN
INTRAMUSCULAR | Status: AC
  Administered 2015-08-08: 10000 [IU]
  Filled 2015-08-08: qty 1

## 2015-08-08 MED ORDER — EPOETIN ALFA 20000 UNIT/ML IJ SOLN
INTRAMUSCULAR | Status: AC
  Administered 2015-08-08: 20000 [IU]
  Filled 2015-08-08: qty 1

## 2015-08-08 MED ORDER — EPOETIN ALFA 10000 UNIT/ML IJ SOLN: 30000.0000 [IU] | INTRAMUSCULAR | Status: DC

## 2015-08-09 LAB — TACROLIMUS LEVEL: TACROLIMUS (FK506) - LABCORP: 2.4 ng/mL (ref 2.0–20.0)

## 2015-08-10 DIAGNOSIS — N183 Chronic kidney disease, stage 3 (moderate): Secondary | ICD-10-CM | POA: Diagnosis not present

## 2015-08-10 DIAGNOSIS — M109 Gout, unspecified: Secondary | ICD-10-CM | POA: Diagnosis not present

## 2015-08-10 DIAGNOSIS — N529 Male erectile dysfunction, unspecified: Secondary | ICD-10-CM | POA: Diagnosis not present

## 2015-08-10 DIAGNOSIS — I129 Hypertensive chronic kidney disease with stage 1 through stage 4 chronic kidney disease, or unspecified chronic kidney disease: Secondary | ICD-10-CM | POA: Diagnosis not present

## 2015-08-10 DIAGNOSIS — D899 Disorder involving the immune mechanism, unspecified: Secondary | ICD-10-CM | POA: Diagnosis not present

## 2015-08-10 DIAGNOSIS — N2581 Secondary hyperparathyroidism of renal origin: Secondary | ICD-10-CM | POA: Diagnosis not present

## 2015-08-10 DIAGNOSIS — E872 Acidosis: Secondary | ICD-10-CM | POA: Diagnosis not present

## 2015-08-10 DIAGNOSIS — D631 Anemia in chronic kidney disease: Secondary | ICD-10-CM | POA: Diagnosis not present

## 2015-08-10 DIAGNOSIS — Z940 Kidney transplant status: Secondary | ICD-10-CM | POA: Diagnosis not present

## 2015-08-10 DIAGNOSIS — N390 Urinary tract infection, site not specified: Secondary | ICD-10-CM | POA: Diagnosis not present

## 2015-08-11 DIAGNOSIS — Z940 Kidney transplant status: Secondary | ICD-10-CM | POA: Diagnosis not present

## 2015-08-15 DIAGNOSIS — Z940 Kidney transplant status: Secondary | ICD-10-CM | POA: Diagnosis not present

## 2015-09-02 ENCOUNTER — Encounter (HOSPITAL_COMMUNITY)
Admission: RE | Admit: 2015-09-02 | Discharge: 2015-09-02 | Disposition: A | Discharge status: Home / Self Care | Payer: Medicare Other | Source: Ambulatory Visit | Attending: Nephrology | Admitting: Nephrology

## 2015-09-02 DIAGNOSIS — N183 Chronic kidney disease, stage 3 (moderate): Secondary | ICD-10-CM | POA: Diagnosis not present

## 2015-09-02 DIAGNOSIS — Z940 Kidney transplant status: Secondary | ICD-10-CM | POA: Diagnosis not present

## 2015-09-02 DIAGNOSIS — D631 Anemia in chronic kidney disease: Secondary | ICD-10-CM | POA: Diagnosis not present

## 2015-09-02 DIAGNOSIS — N2581 Secondary hyperparathyroidism of renal origin: Secondary | ICD-10-CM | POA: Diagnosis not present

## 2015-09-02 LAB — POCT HEMOGLOBIN-HEMACUE: HEMOGLOBIN: 9.2 g/dL — AB (ref 13.0–17.0)

## 2015-09-02 MED ORDER — EPOETIN ALFA 20000 UNIT/ML IJ SOLN
INTRAMUSCULAR | Status: AC
  Administered 2015-09-02: 20000 [IU] via SUBCUTANEOUS
  Filled 2015-09-02: qty 1

## 2015-09-02 MED ORDER — EPOETIN ALFA 10000 UNIT/ML IJ SOLN
30000.0000 [IU] | INTRAMUSCULAR | Status: DC
  Administered 2015-09-02: 10000 [IU] via SUBCUTANEOUS

## 2015-09-02 MED ORDER — EPOETIN ALFA 10000 UNIT/ML IJ SOLN
INTRAMUSCULAR | Status: AC
  Filled 2015-09-02: qty 1

## 2015-09-23 ENCOUNTER — Encounter (HOSPITAL_COMMUNITY)
Admission: RE | Admit: 2015-09-23 | Discharge: 2015-09-23 | Disposition: A | Discharge status: Home / Self Care | Payer: Medicare Other | Source: Ambulatory Visit | Attending: Nephrology | Admitting: Nephrology

## 2015-09-23 DIAGNOSIS — D631 Anemia in chronic kidney disease: Secondary | ICD-10-CM | POA: Diagnosis not present

## 2015-09-23 DIAGNOSIS — N183 Chronic kidney disease, stage 3 (moderate): Secondary | ICD-10-CM | POA: Diagnosis not present

## 2015-09-23 DIAGNOSIS — N2581 Secondary hyperparathyroidism of renal origin: Secondary | ICD-10-CM | POA: Diagnosis not present

## 2015-09-23 DIAGNOSIS — Z940 Kidney transplant status: Secondary | ICD-10-CM | POA: Diagnosis not present

## 2015-09-23 LAB — URINE MICROSCOPIC-ADD ON: RBC / HPF: NONE SEEN RBC/hpf (ref 0–5)

## 2015-09-23 LAB — COMPREHENSIVE METABOLIC PANEL
ALBUMIN: 3.2 g/dL — AB (ref 3.5–5.0)
ALT: 12 U/L — ABNORMAL LOW (ref 17–63)
AST: 18 U/L (ref 15–41)
Alkaline Phosphatase: 66 U/L (ref 38–126)
Anion gap: 3 — ABNORMAL LOW (ref 5–15)
BILIRUBIN TOTAL: 0.8 mg/dL (ref 0.3–1.2)
BUN: 26 mg/dL — AB (ref 6–20)
CHLORIDE: 109 mmol/L (ref 101–111)
CO2: 25 mmol/L (ref 22–32)
Calcium: 8.9 mg/dL (ref 8.9–10.3)
Creatinine, Ser: 2.62 mg/dL — ABNORMAL HIGH (ref 0.61–1.24)
GFR calc Af Amer: 27 mL/min — ABNORMAL LOW (ref >60)
GFR calc non Af Amer: 23 mL/min — ABNORMAL LOW (ref >60)
GLUCOSE: 106 mg/dL — AB (ref 65–99)
POTASSIUM: 4.2 mmol/L (ref 3.5–5.1)
SODIUM: 137 mmol/L (ref 135–145)
TOTAL PROTEIN: 8.6 g/dL — AB (ref 6.5–8.1)

## 2015-09-23 LAB — URINALYSIS, ROUTINE W REFLEX MICROSCOPIC
BILIRUBIN URINE: NEGATIVE
GLUCOSE, UA: NEGATIVE mg/dL
HGB URINE DIPSTICK: NEGATIVE
KETONES UR: NEGATIVE mg/dL
Nitrite: NEGATIVE
PROTEIN: 30 mg/dL — AB
Specific Gravity, Urine: 1.012 (ref 1.005–1.030)
pH: 6 (ref 5.0–8.0)

## 2015-09-23 LAB — LIPID PANEL
CHOL/HDL RATIO: 2.5 ratio
Cholesterol: 129 mg/dL (ref 0–200)
HDL: 52 mg/dL (ref >40)
LDL CALC: 59 mg/dL (ref 0–99)
TRIGLYCERIDES: 88 mg/dL (ref <150)
VLDL: 18 mg/dL (ref 0–40)

## 2015-09-23 LAB — IRON AND TIBC
Iron: 100 ug/dL (ref 45–182)
Saturation Ratios: 63 % — ABNORMAL HIGH (ref 17.9–39.5)
TIBC: 160 ug/dL — AB (ref 250–450)
UIBC: 60 ug/dL

## 2015-09-23 LAB — CBC
HEMATOCRIT: 28.5 % — AB (ref 39.0–52.0)
HEMOGLOBIN: 9.1 g/dL — AB (ref 13.0–17.0)
MCH: 27.8 pg (ref 26.0–34.0)
MCHC: 31.9 g/dL (ref 30.0–36.0)
MCV: 87.2 fL (ref 78.0–100.0)
Platelets: 215 10*3/uL (ref 150–400)
RBC: 3.27 MIL/uL — ABNORMAL LOW (ref 4.22–5.81)
RDW: 14.1 % (ref 11.5–15.5)
WBC: 8.1 10*3/uL (ref 4.0–10.5)

## 2015-09-23 LAB — PHOSPHORUS: Phosphorus: 2.8 mg/dL (ref 2.5–4.6)

## 2015-09-23 LAB — MAGNESIUM: Magnesium: 1.5 mg/dL — ABNORMAL LOW (ref 1.7–2.4)

## 2015-09-23 LAB — FERRITIN: FERRITIN: 706 ng/mL — AB (ref 24–336)

## 2015-09-23 MED ORDER — EPOETIN ALFA 10000 UNIT/ML IJ SOLN
INTRAMUSCULAR | Status: AC
  Administered 2015-09-23: 10000 [IU] via SUBCUTANEOUS
  Filled 2015-09-23: qty 1

## 2015-09-23 MED ORDER — EPOETIN ALFA 10000 UNIT/ML IJ SOLN: 30000.0000 [IU] | INTRAMUSCULAR | Status: DC

## 2015-09-23 MED ORDER — EPOETIN ALFA 20000 UNIT/ML IJ SOLN
INTRAMUSCULAR | Status: AC
  Administered 2015-09-23: 20000 [IU] via SUBCUTANEOUS
  Filled 2015-09-23: qty 1

## 2015-09-24 LAB — PTH, INTACT AND CALCIUM
Calcium, Total (PTH): 8.6 mg/dL (ref 8.6–10.2)
PTH: 78 pg/mL — ABNORMAL HIGH (ref 15–65)

## 2015-09-29 ENCOUNTER — Other Ambulatory Visit: Payer: Self-pay | Admitting: Internal Medicine

## 2015-09-29 DIAGNOSIS — D631 Anemia in chronic kidney disease: Secondary | ICD-10-CM | POA: Insufficient documentation

## 2015-09-29 DIAGNOSIS — N189 Chronic kidney disease, unspecified: Principal | ICD-10-CM

## 2015-10-14 ENCOUNTER — Encounter (HOSPITAL_COMMUNITY): Payer: Self-pay

## 2015-10-21 ENCOUNTER — Encounter (HOSPITAL_COMMUNITY)
Admission: RE | Admit: 2015-10-21 | Discharge: 2015-10-21 | Disposition: A | Discharge status: Home / Self Care | Payer: Medicare Other | Source: Ambulatory Visit | Attending: Nephrology | Admitting: Nephrology

## 2015-10-21 DIAGNOSIS — D631 Anemia in chronic kidney disease: Secondary | ICD-10-CM | POA: Diagnosis not present

## 2015-10-21 DIAGNOSIS — Z940 Kidney transplant status: Secondary | ICD-10-CM | POA: Insufficient documentation

## 2015-10-21 DIAGNOSIS — N2581 Secondary hyperparathyroidism of renal origin: Secondary | ICD-10-CM | POA: Insufficient documentation

## 2015-10-21 DIAGNOSIS — N189 Chronic kidney disease, unspecified: Secondary | ICD-10-CM

## 2015-10-21 DIAGNOSIS — N183 Chronic kidney disease, stage 3 (moderate): Secondary | ICD-10-CM | POA: Diagnosis not present

## 2015-10-21 LAB — POCT HEMOGLOBIN-HEMACUE: HEMOGLOBIN: 8.4 g/dL — AB (ref 13.0–17.0)

## 2015-10-21 MED ORDER — EPOETIN ALFA 10000 UNIT/ML IJ SOLN: 30000.0000 [IU] | INTRAMUSCULAR | Status: DC

## 2015-10-21 MED ORDER — EPOETIN ALFA 10000 UNIT/ML IJ SOLN
INTRAMUSCULAR | Status: AC
  Administered 2015-10-21: 10000 [IU]
  Filled 2015-10-21: qty 1

## 2015-10-21 MED ORDER — EPOETIN ALFA 20000 UNIT/ML IJ SOLN
INTRAMUSCULAR | Status: AC
Start: 2015-10-21 — End: 2015-10-21
  Administered 2015-10-21: 20000 [IU]
  Filled 2015-10-21: qty 1

## 2015-11-10 ENCOUNTER — Other Ambulatory Visit (HOSPITAL_COMMUNITY): Payer: Self-pay | Admitting: *Deleted

## 2015-11-11 ENCOUNTER — Encounter (HOSPITAL_COMMUNITY)
Admission: RE | Admit: 2015-11-11 | Discharge: 2015-11-11 | Disposition: A | Discharge status: Home / Self Care | Payer: Medicare Other | Source: Ambulatory Visit | Attending: Nephrology | Admitting: Nephrology

## 2015-11-11 DIAGNOSIS — Z940 Kidney transplant status: Secondary | ICD-10-CM | POA: Insufficient documentation

## 2015-11-11 DIAGNOSIS — N183 Chronic kidney disease, stage 3 (moderate): Secondary | ICD-10-CM | POA: Diagnosis not present

## 2015-11-11 DIAGNOSIS — D631 Anemia in chronic kidney disease: Secondary | ICD-10-CM | POA: Insufficient documentation

## 2015-11-11 DIAGNOSIS — N189 Chronic kidney disease, unspecified: Secondary | ICD-10-CM

## 2015-11-11 DIAGNOSIS — N2581 Secondary hyperparathyroidism of renal origin: Secondary | ICD-10-CM | POA: Insufficient documentation

## 2015-11-11 LAB — POCT HEMOGLOBIN-HEMACUE: HEMOGLOBIN: 9.1 g/dL — AB (ref 13.0–17.0)

## 2015-11-11 MED ORDER — EPOETIN ALFA 20000 UNIT/ML IJ SOLN
INTRAMUSCULAR | Status: AC
  Administered 2015-11-11: 20000 [IU] via SUBCUTANEOUS
  Filled 2015-11-11: qty 1

## 2015-11-11 MED ORDER — EPOETIN ALFA 10000 UNIT/ML IJ SOLN: 30000.0000 [IU] | INTRAMUSCULAR | Status: DC

## 2015-11-11 MED ORDER — EPOETIN ALFA 10000 UNIT/ML IJ SOLN
INTRAMUSCULAR | Status: AC
  Administered 2015-11-11: 10000 [IU] via SUBCUTANEOUS
  Filled 2015-11-11: qty 1

## 2015-12-02 ENCOUNTER — Inpatient Hospital Stay (HOSPITAL_COMMUNITY): Admission: RE | Admit: 2015-12-02 | Payer: Self-pay | Source: Ambulatory Visit

## 2015-12-05 ENCOUNTER — Encounter (HOSPITAL_COMMUNITY)
Admission: RE | Admit: 2015-12-05 | Discharge: 2015-12-05 | Disposition: A | Discharge status: Home / Self Care | Payer: Medicare Other | Source: Ambulatory Visit | Attending: Nephrology | Admitting: Nephrology

## 2015-12-05 DIAGNOSIS — Z940 Kidney transplant status: Secondary | ICD-10-CM | POA: Insufficient documentation

## 2015-12-05 DIAGNOSIS — D631 Anemia in chronic kidney disease: Secondary | ICD-10-CM | POA: Diagnosis not present

## 2015-12-05 DIAGNOSIS — N2581 Secondary hyperparathyroidism of renal origin: Secondary | ICD-10-CM | POA: Insufficient documentation

## 2015-12-05 DIAGNOSIS — N183 Chronic kidney disease, stage 3 (moderate): Secondary | ICD-10-CM | POA: Diagnosis not present

## 2015-12-05 LAB — URINALYSIS, ROUTINE W REFLEX MICROSCOPIC
Bilirubin Urine: NEGATIVE
Glucose, UA: NEGATIVE mg/dL
Ketones, ur: NEGATIVE mg/dL
NITRITE: NEGATIVE
PH: 5.5 (ref 5.0–8.0)
Protein, ur: 30 mg/dL — AB
SPECIFIC GRAVITY, URINE: 1.01 (ref 1.005–1.030)

## 2015-12-05 LAB — LIPID PANEL
CHOL/HDL RATIO: 2.9 ratio
CHOLESTEROL: 137 mg/dL (ref 0–200)
HDL: 48 mg/dL (ref >40)
LDL Cholesterol: 71 mg/dL (ref 0–99)
Triglycerides: 91 mg/dL (ref <150)
VLDL: 18 mg/dL (ref 0–40)

## 2015-12-05 LAB — COMPREHENSIVE METABOLIC PANEL
ALK PHOS: 59 U/L (ref 38–126)
ALT: 11 U/L — ABNORMAL LOW (ref 17–63)
ANION GAP: 5 (ref 5–15)
AST: 22 U/L (ref 15–41)
Albumin: 3.2 g/dL — ABNORMAL LOW (ref 3.5–5.0)
BUN: 35 mg/dL — ABNORMAL HIGH (ref 6–20)
CALCIUM: 9.1 mg/dL (ref 8.9–10.3)
CO2: 23 mmol/L (ref 22–32)
CREATININE: 3.27 mg/dL — AB (ref 0.61–1.24)
Chloride: 108 mmol/L (ref 101–111)
GFR, EST AFRICAN AMERICAN: 21 mL/min — AB (ref >60)
GFR, EST NON AFRICAN AMERICAN: 18 mL/min — AB (ref >60)
Glucose, Bld: 107 mg/dL — ABNORMAL HIGH (ref 65–99)
Potassium: 4.1 mmol/L (ref 3.5–5.1)
SODIUM: 136 mmol/L (ref 135–145)
TOTAL PROTEIN: 8.4 g/dL — AB (ref 6.5–8.1)
Total Bilirubin: 1 mg/dL (ref 0.3–1.2)

## 2015-12-05 LAB — CBC
HCT: 28.3 % — ABNORMAL LOW (ref 39.0–52.0)
HEMOGLOBIN: 9.3 g/dL — AB (ref 13.0–17.0)
MCH: 28.7 pg (ref 26.0–34.0)
MCHC: 32.9 g/dL (ref 30.0–36.0)
MCV: 87.3 fL (ref 78.0–100.0)
PLATELETS: 191 10*3/uL (ref 150–400)
RBC: 3.24 MIL/uL — ABNORMAL LOW (ref 4.22–5.81)
RDW: 14 % (ref 11.5–15.5)
WBC: 6.9 10*3/uL (ref 4.0–10.5)

## 2015-12-05 LAB — IRON AND TIBC
IRON: 134 ug/dL (ref 45–182)
Saturation Ratios: 78 % — ABNORMAL HIGH (ref 17.9–39.5)
TIBC: 172 ug/dL — ABNORMAL LOW (ref 250–450)
UIBC: 38 ug/dL

## 2015-12-05 LAB — URINE MICROSCOPIC-ADD ON

## 2015-12-05 LAB — PHOSPHORUS: PHOSPHORUS: 2.7 mg/dL (ref 2.5–4.6)

## 2015-12-05 LAB — POCT HEMOGLOBIN-HEMACUE: HEMOGLOBIN: 9.2 g/dL — AB (ref 13.0–17.0)

## 2015-12-05 LAB — FERRITIN: Ferritin: 612 ng/mL — ABNORMAL HIGH (ref 24–336)

## 2015-12-05 LAB — MAGNESIUM: MAGNESIUM: 1.5 mg/dL — AB (ref 1.7–2.4)

## 2015-12-05 MED ORDER — EPOETIN ALFA 20000 UNIT/ML IJ SOLN
INTRAMUSCULAR | Status: AC
  Filled 2015-12-05: qty 1

## 2015-12-05 MED ORDER — EPOETIN ALFA 10000 UNIT/ML IJ SOLN
INTRAMUSCULAR | Status: AC
  Filled 2015-12-05: qty 1

## 2015-12-05 MED ORDER — EPOETIN ALFA 10000 UNIT/ML IJ SOLN
30000.0000 [IU] | INTRAMUSCULAR | Status: DC
  Administered 2015-12-05: 20000 [IU] via SUBCUTANEOUS
  Administered 2015-12-05: 10000 [IU] via SUBCUTANEOUS

## 2015-12-06 LAB — TACROLIMUS LEVEL: TACROLIMUS (FK506) - LABCORP: 12.2 ng/mL (ref 2.0–20.0)

## 2015-12-06 LAB — PTH, INTACT AND CALCIUM
Calcium, Total (PTH): 9 mg/dL (ref 8.6–10.2)
PTH: 85 pg/mL — AB (ref 15–65)

## 2015-12-06 MED FILL — Epoetin Alfa Inj 20000 Unit/ML: INTRAMUSCULAR | Qty: 1 | Status: AC

## 2015-12-19 DIAGNOSIS — D631 Anemia in chronic kidney disease: Secondary | ICD-10-CM | POA: Diagnosis not present

## 2015-12-19 DIAGNOSIS — D899 Disorder involving the immune mechanism, unspecified: Secondary | ICD-10-CM | POA: Diagnosis not present

## 2015-12-19 DIAGNOSIS — N2581 Secondary hyperparathyroidism of renal origin: Secondary | ICD-10-CM | POA: Diagnosis not present

## 2015-12-19 DIAGNOSIS — N189 Chronic kidney disease, unspecified: Secondary | ICD-10-CM | POA: Diagnosis not present

## 2015-12-19 DIAGNOSIS — M109 Gout, unspecified: Secondary | ICD-10-CM | POA: Diagnosis not present

## 2015-12-19 DIAGNOSIS — Z940 Kidney transplant status: Secondary | ICD-10-CM | POA: Diagnosis not present

## 2015-12-19 DIAGNOSIS — N529 Male erectile dysfunction, unspecified: Secondary | ICD-10-CM | POA: Diagnosis not present

## 2015-12-19 DIAGNOSIS — I129 Hypertensive chronic kidney disease with stage 1 through stage 4 chronic kidney disease, or unspecified chronic kidney disease: Secondary | ICD-10-CM | POA: Diagnosis not present

## 2015-12-19 DIAGNOSIS — E872 Acidosis: Secondary | ICD-10-CM | POA: Diagnosis not present

## 2015-12-23 ENCOUNTER — Encounter (HOSPITAL_COMMUNITY)
Admission: RE | Admit: 2015-12-23 | Discharge: 2015-12-23 | Disposition: A | Discharge status: Home / Self Care | Payer: Medicare Other | Source: Ambulatory Visit | Attending: Nephrology | Admitting: Nephrology

## 2015-12-23 DIAGNOSIS — D631 Anemia in chronic kidney disease: Secondary | ICD-10-CM | POA: Diagnosis not present

## 2015-12-23 DIAGNOSIS — N2581 Secondary hyperparathyroidism of renal origin: Secondary | ICD-10-CM | POA: Diagnosis not present

## 2015-12-23 DIAGNOSIS — N183 Chronic kidney disease, stage 3 (moderate): Secondary | ICD-10-CM | POA: Diagnosis not present

## 2015-12-23 DIAGNOSIS — Z940 Kidney transplant status: Secondary | ICD-10-CM | POA: Diagnosis not present

## 2015-12-23 MED ORDER — EPOETIN ALFA 10000 UNIT/ML IJ SOLN: 30000.0000 [IU] | INTRAMUSCULAR | Status: DC

## 2015-12-23 MED ORDER — EPOETIN ALFA 20000 UNIT/ML IJ SOLN
INTRAMUSCULAR | Status: AC
  Administered 2015-12-23: 20000 [IU]
  Filled 2015-12-23: qty 1

## 2015-12-23 MED ORDER — EPOETIN ALFA 10000 UNIT/ML IJ SOLN
INTRAMUSCULAR | Status: AC
  Administered 2015-12-23: 10000 [IU]
  Filled 2015-12-23: qty 1

## 2015-12-26 LAB — POCT HEMOGLOBIN-HEMACUE: Hemoglobin: 7.6 g/dL — ABNORMAL LOW (ref 13.0–17.0)

## 2016-01-05 ENCOUNTER — Other Ambulatory Visit (HOSPITAL_COMMUNITY): Payer: Self-pay | Admitting: *Deleted

## 2016-01-06 ENCOUNTER — Encounter (HOSPITAL_COMMUNITY)
Admission: RE | Admit: 2016-01-06 | Discharge: 2016-01-06 | Disposition: A | Discharge status: Home / Self Care | Payer: Medicare Other | Source: Ambulatory Visit | Attending: Nephrology | Admitting: Nephrology

## 2016-01-06 DIAGNOSIS — Z940 Kidney transplant status: Secondary | ICD-10-CM | POA: Insufficient documentation

## 2016-01-06 DIAGNOSIS — D631 Anemia in chronic kidney disease: Secondary | ICD-10-CM | POA: Insufficient documentation

## 2016-01-06 DIAGNOSIS — N183 Chronic kidney disease, stage 3 (moderate): Secondary | ICD-10-CM | POA: Insufficient documentation

## 2016-01-06 DIAGNOSIS — N2581 Secondary hyperparathyroidism of renal origin: Secondary | ICD-10-CM | POA: Diagnosis not present

## 2016-01-06 DIAGNOSIS — N189 Chronic kidney disease, unspecified: Secondary | ICD-10-CM

## 2016-01-06 LAB — POCT HEMOGLOBIN-HEMACUE: HEMOGLOBIN: 9.4 g/dL — AB (ref 13.0–17.0)

## 2016-01-06 MED ORDER — EPOETIN ALFA 20000 UNIT/ML IJ SOLN
INTRAMUSCULAR | Status: AC
  Administered 2016-01-06: 20000 [IU]
  Filled 2016-01-06: qty 1

## 2016-01-06 MED ORDER — EPOETIN ALFA 10000 UNIT/ML IJ SOLN
30000.0000 [IU] | INTRAMUSCULAR | Status: DC
  Administered 2016-01-06: 10000 [IU] via SUBCUTANEOUS

## 2016-01-06 MED ORDER — EPOETIN ALFA 10000 UNIT/ML IJ SOLN
INTRAMUSCULAR | Status: AC
  Filled 2016-01-06: qty 1

## 2016-01-20 ENCOUNTER — Encounter (HOSPITAL_COMMUNITY)
Admission: RE | Admit: 2016-01-20 | Discharge: 2016-01-20 | Disposition: A | Discharge status: Home / Self Care | Payer: Medicare Other | Source: Ambulatory Visit | Attending: Nephrology | Admitting: Nephrology

## 2016-01-20 DIAGNOSIS — N183 Chronic kidney disease, stage 3 (moderate): Secondary | ICD-10-CM | POA: Diagnosis not present

## 2016-01-20 DIAGNOSIS — D631 Anemia in chronic kidney disease: Secondary | ICD-10-CM | POA: Diagnosis not present

## 2016-01-20 DIAGNOSIS — Z940 Kidney transplant status: Secondary | ICD-10-CM | POA: Diagnosis not present

## 2016-01-20 DIAGNOSIS — N2581 Secondary hyperparathyroidism of renal origin: Secondary | ICD-10-CM | POA: Diagnosis not present

## 2016-01-20 DIAGNOSIS — N189 Chronic kidney disease, unspecified: Principal | ICD-10-CM

## 2016-01-20 LAB — POCT HEMOGLOBIN-HEMACUE: Hemoglobin: 9.3 g/dL — ABNORMAL LOW (ref 13.0–17.0)

## 2016-01-20 MED ORDER — EPOETIN ALFA 10000 UNIT/ML IJ SOLN: 30000.0000 [IU] | INTRAMUSCULAR | Status: DC

## 2016-01-20 MED ORDER — EPOETIN ALFA 20000 UNIT/ML IJ SOLN
INTRAMUSCULAR | Status: AC
  Administered 2016-01-20: 20000 [IU] via SUBCUTANEOUS
  Filled 2016-01-20: qty 1

## 2016-01-20 MED ORDER — EPOETIN ALFA 10000 UNIT/ML IJ SOLN
INTRAMUSCULAR | Status: AC
  Administered 2016-01-20: 10000 [IU] via SUBCUTANEOUS
  Filled 2016-01-20: qty 1

## 2016-02-03 ENCOUNTER — Encounter (HOSPITAL_COMMUNITY)
Admission: RE | Admit: 2016-02-03 | Discharge: 2016-02-03 | Disposition: A | Discharge status: Home / Self Care | Payer: Medicare Other | Source: Ambulatory Visit | Attending: Nephrology | Admitting: Nephrology

## 2016-02-03 DIAGNOSIS — D631 Anemia in chronic kidney disease: Secondary | ICD-10-CM | POA: Diagnosis not present

## 2016-02-03 DIAGNOSIS — Z940 Kidney transplant status: Secondary | ICD-10-CM | POA: Diagnosis not present

## 2016-02-03 DIAGNOSIS — N2581 Secondary hyperparathyroidism of renal origin: Secondary | ICD-10-CM | POA: Insufficient documentation

## 2016-02-03 DIAGNOSIS — N183 Chronic kidney disease, stage 3 (moderate): Secondary | ICD-10-CM | POA: Insufficient documentation

## 2016-02-03 DIAGNOSIS — N189 Chronic kidney disease, unspecified: Secondary | ICD-10-CM

## 2016-02-03 LAB — POCT HEMOGLOBIN-HEMACUE: Hemoglobin: 9.3 g/dL — ABNORMAL LOW (ref 13.0–17.0)

## 2016-02-03 MED ORDER — EPOETIN ALFA 20000 UNIT/ML IJ SOLN
INTRAMUSCULAR | Status: AC
  Administered 2016-02-03: 20000 [IU]
  Filled 2016-02-03: qty 1

## 2016-02-03 MED ORDER — EPOETIN ALFA 10000 UNIT/ML IJ SOLN
INTRAMUSCULAR | Status: AC
  Administered 2016-02-03: 10000 [IU] via SUBCUTANEOUS
  Filled 2016-02-03: qty 1

## 2016-02-03 MED ORDER — EPOETIN ALFA 10000 UNIT/ML IJ SOLN
30000.0000 [IU] | INTRAMUSCULAR | Status: DC
  Administered 2016-02-03: 10000 [IU] via SUBCUTANEOUS

## 2016-02-17 ENCOUNTER — Encounter (HOSPITAL_COMMUNITY)
Admission: RE | Admit: 2016-02-17 | Discharge: 2016-02-17 | Disposition: A | Discharge status: Home / Self Care | Payer: Medicare Other | Source: Ambulatory Visit | Attending: Nephrology | Admitting: Nephrology

## 2016-02-17 DIAGNOSIS — N189 Chronic kidney disease, unspecified: Principal | ICD-10-CM

## 2016-02-17 DIAGNOSIS — N2581 Secondary hyperparathyroidism of renal origin: Secondary | ICD-10-CM | POA: Diagnosis not present

## 2016-02-17 DIAGNOSIS — Z940 Kidney transplant status: Secondary | ICD-10-CM | POA: Diagnosis not present

## 2016-02-17 DIAGNOSIS — D631 Anemia in chronic kidney disease: Secondary | ICD-10-CM

## 2016-02-17 DIAGNOSIS — N183 Chronic kidney disease, stage 3 (moderate): Secondary | ICD-10-CM | POA: Diagnosis not present

## 2016-02-17 LAB — POCT HEMOGLOBIN-HEMACUE: Hemoglobin: 9.3 g/dL — ABNORMAL LOW (ref 13.0–17.0)

## 2016-02-17 MED ORDER — EPOETIN ALFA 10000 UNIT/ML IJ SOLN
INTRAMUSCULAR | Status: AC
  Administered 2016-02-17: 10000 [IU] via SUBCUTANEOUS
  Filled 2016-02-17: qty 1

## 2016-02-17 MED ORDER — EPOETIN ALFA 20000 UNIT/ML IJ SOLN
INTRAMUSCULAR | Status: AC
  Administered 2016-02-17: 20000 [IU] via SUBCUTANEOUS
  Filled 2016-02-17: qty 1

## 2016-02-17 MED ORDER — EPOETIN ALFA 10000 UNIT/ML IJ SOLN: 30000.0000 [IU] | INTRAMUSCULAR | Status: DC

## 2016-03-02 ENCOUNTER — Encounter (HOSPITAL_COMMUNITY)
Admission: RE | Admit: 2016-03-02 | Discharge: 2016-03-02 | Disposition: A | Discharge status: Home / Self Care | Payer: Medicare Other | Source: Ambulatory Visit | Attending: Nephrology | Admitting: Nephrology

## 2016-03-02 DIAGNOSIS — N183 Chronic kidney disease, stage 3 (moderate): Secondary | ICD-10-CM | POA: Diagnosis not present

## 2016-03-02 DIAGNOSIS — D631 Anemia in chronic kidney disease: Secondary | ICD-10-CM | POA: Diagnosis not present

## 2016-03-02 DIAGNOSIS — N189 Chronic kidney disease, unspecified: Principal | ICD-10-CM

## 2016-03-02 DIAGNOSIS — Z940 Kidney transplant status: Secondary | ICD-10-CM | POA: Diagnosis not present

## 2016-03-02 DIAGNOSIS — N2581 Secondary hyperparathyroidism of renal origin: Secondary | ICD-10-CM | POA: Diagnosis not present

## 2016-03-02 LAB — POCT HEMOGLOBIN-HEMACUE: HEMOGLOBIN: 10.8 g/dL — AB (ref 13.0–17.0)

## 2016-03-02 MED ORDER — EPOETIN ALFA 10000 UNIT/ML IJ SOLN: 30000.0000 [IU] | INTRAMUSCULAR | Status: DC

## 2016-03-02 MED ORDER — EPOETIN ALFA 20000 UNIT/ML IJ SOLN
INTRAMUSCULAR | Status: AC
  Administered 2016-03-02: 10:00:00 20000 [IU] via SUBCUTANEOUS
  Filled 2016-03-02: qty 1

## 2016-03-02 MED ORDER — EPOETIN ALFA 10000 UNIT/ML IJ SOLN
INTRAMUSCULAR | Status: AC
  Administered 2016-03-02: 10:00:00 10000 [IU] via SUBCUTANEOUS
  Filled 2016-03-02: qty 1

## 2016-03-15 ENCOUNTER — Other Ambulatory Visit (HOSPITAL_COMMUNITY): Payer: Self-pay | Admitting: *Deleted

## 2016-03-16 ENCOUNTER — Encounter (HOSPITAL_COMMUNITY)
Admission: RE | Admit: 2016-03-16 | Discharge: 2016-03-16 | Disposition: A | Discharge status: Home / Self Care | Payer: Medicare Other | Source: Ambulatory Visit | Attending: Nephrology | Admitting: Nephrology

## 2016-03-16 DIAGNOSIS — N183 Chronic kidney disease, stage 3 (moderate): Secondary | ICD-10-CM | POA: Diagnosis not present

## 2016-03-16 DIAGNOSIS — Z940 Kidney transplant status: Secondary | ICD-10-CM | POA: Diagnosis not present

## 2016-03-16 DIAGNOSIS — D631 Anemia in chronic kidney disease: Secondary | ICD-10-CM | POA: Diagnosis not present

## 2016-03-16 DIAGNOSIS — N2581 Secondary hyperparathyroidism of renal origin: Secondary | ICD-10-CM | POA: Insufficient documentation

## 2016-03-16 DIAGNOSIS — N189 Chronic kidney disease, unspecified: Secondary | ICD-10-CM

## 2016-03-16 LAB — POCT HEMOGLOBIN-HEMACUE: Hemoglobin: 9.9 g/dL — ABNORMAL LOW (ref 13.0–17.0)

## 2016-03-16 LAB — IRON AND TIBC
IRON: 68 ug/dL (ref 45–182)
SATURATION RATIOS: 41 % — AB (ref 17.9–39.5)
TIBC: 167 ug/dL — AB (ref 250–450)
UIBC: 99 ug/dL

## 2016-03-16 LAB — FERRITIN: Ferritin: 575 ng/mL — ABNORMAL HIGH (ref 24–336)

## 2016-03-16 MED ORDER — EPOETIN ALFA 10000 UNIT/ML IJ SOLN
INTRAMUSCULAR | Status: AC
  Administered 2016-03-16: 10000 [IU] via SUBCUTANEOUS
  Filled 2016-03-16: qty 1

## 2016-03-16 MED ORDER — EPOETIN ALFA 10000 UNIT/ML IJ SOLN: 30000.0000 [IU] | INTRAMUSCULAR | Status: DC

## 2016-03-16 MED ORDER — EPOETIN ALFA 20000 UNIT/ML IJ SOLN
INTRAMUSCULAR | Status: AC
  Administered 2016-03-16: 15:00:00 20000 [IU] via SUBCUTANEOUS
  Filled 2016-03-16: qty 1

## 2016-03-16 MED ORDER — CLONIDINE HCL 0.1 MG PO TABS: 0.1000 mg | ORAL_TABLET | Freq: Once | ORAL | Status: DC | PRN

## 2016-03-17 LAB — PTH, INTACT AND CALCIUM
Calcium, Total (PTH): 8.6 mg/dL (ref 8.6–10.2)
PTH: 98 pg/mL — AB (ref 15–65)

## 2016-03-30 ENCOUNTER — Encounter (HOSPITAL_COMMUNITY)
Admission: RE | Admit: 2016-03-30 | Discharge: 2016-03-30 | Disposition: A | Discharge status: Home / Self Care | Payer: Medicare Other | Source: Ambulatory Visit | Attending: Nephrology | Admitting: Nephrology

## 2016-03-30 DIAGNOSIS — Z940 Kidney transplant status: Secondary | ICD-10-CM | POA: Diagnosis not present

## 2016-03-30 DIAGNOSIS — D631 Anemia in chronic kidney disease: Secondary | ICD-10-CM | POA: Diagnosis not present

## 2016-03-30 DIAGNOSIS — N2581 Secondary hyperparathyroidism of renal origin: Secondary | ICD-10-CM | POA: Diagnosis not present

## 2016-03-30 DIAGNOSIS — N189 Chronic kidney disease, unspecified: Principal | ICD-10-CM

## 2016-03-30 DIAGNOSIS — N183 Chronic kidney disease, stage 3 (moderate): Secondary | ICD-10-CM | POA: Diagnosis not present

## 2016-03-30 LAB — POCT HEMOGLOBIN-HEMACUE: Hemoglobin: 10.8 g/dL — ABNORMAL LOW (ref 13.0–17.0)

## 2016-03-30 MED ORDER — CLONIDINE HCL 0.1 MG PO TABS: 0.1000 mg | ORAL_TABLET | Freq: Once | ORAL | Status: DC | PRN

## 2016-03-30 MED ORDER — EPOETIN ALFA 10000 UNIT/ML IJ SOLN: 30000.0000 [IU] | INTRAMUSCULAR | Status: DC

## 2016-03-30 MED ORDER — EPOETIN ALFA 10000 UNIT/ML IJ SOLN
INTRAMUSCULAR | Status: AC
  Administered 2016-03-30: 10000 [IU]
  Filled 2016-03-30: qty 1

## 2016-03-30 MED ORDER — EPOETIN ALFA 20000 UNIT/ML IJ SOLN
INTRAMUSCULAR | Status: AC
  Administered 2016-03-30: 20000 [IU]
  Filled 2016-03-30: qty 1

## 2016-04-12 ENCOUNTER — Encounter (HOSPITAL_COMMUNITY)
Admission: RE | Admit: 2016-04-12 | Discharge: 2016-04-12 | Disposition: A | Discharge status: Home / Self Care | Payer: Medicare Other | Source: Ambulatory Visit | Attending: Nephrology | Admitting: Nephrology

## 2016-04-12 DIAGNOSIS — Z940 Kidney transplant status: Secondary | ICD-10-CM | POA: Insufficient documentation

## 2016-04-12 DIAGNOSIS — D631 Anemia in chronic kidney disease: Secondary | ICD-10-CM | POA: Insufficient documentation

## 2016-04-12 DIAGNOSIS — N2581 Secondary hyperparathyroidism of renal origin: Secondary | ICD-10-CM | POA: Insufficient documentation

## 2016-04-12 DIAGNOSIS — N183 Chronic kidney disease, stage 3 (moderate): Secondary | ICD-10-CM | POA: Insufficient documentation

## 2016-04-12 DIAGNOSIS — N189 Chronic kidney disease, unspecified: Secondary | ICD-10-CM

## 2016-04-12 LAB — POCT HEMOGLOBIN-HEMACUE: HEMOGLOBIN: 9.9 g/dL — AB (ref 13.0–17.0)

## 2016-04-12 MED ORDER — EPOETIN ALFA 10000 UNIT/ML IJ SOLN: 30000.0000 [IU] | INTRAMUSCULAR | Status: DC

## 2016-04-12 MED ORDER — EPOETIN ALFA 10000 UNIT/ML IJ SOLN
INTRAMUSCULAR | Status: AC
  Administered 2016-04-12: 10000 [IU] via SUBCUTANEOUS
  Filled 2016-04-12: qty 1

## 2016-04-12 MED ORDER — EPOETIN ALFA 20000 UNIT/ML IJ SOLN
INTRAMUSCULAR | Status: AC
  Administered 2016-04-12: 14:00:00 20000 [IU] via SUBCUTANEOUS
  Filled 2016-04-12: qty 1

## 2016-04-13 ENCOUNTER — Encounter (HOSPITAL_COMMUNITY): Payer: Self-pay

## 2016-04-18 DIAGNOSIS — M109 Gout, unspecified: Secondary | ICD-10-CM | POA: Diagnosis not present

## 2016-04-18 DIAGNOSIS — N529 Male erectile dysfunction, unspecified: Secondary | ICD-10-CM | POA: Diagnosis not present

## 2016-04-18 DIAGNOSIS — E872 Acidosis: Secondary | ICD-10-CM | POA: Diagnosis not present

## 2016-04-18 DIAGNOSIS — Z940 Kidney transplant status: Secondary | ICD-10-CM | POA: Diagnosis not present

## 2016-04-18 DIAGNOSIS — N2581 Secondary hyperparathyroidism of renal origin: Secondary | ICD-10-CM | POA: Diagnosis not present

## 2016-04-18 DIAGNOSIS — D631 Anemia in chronic kidney disease: Secondary | ICD-10-CM | POA: Diagnosis not present

## 2016-04-18 DIAGNOSIS — D899 Disorder involving the immune mechanism, unspecified: Secondary | ICD-10-CM | POA: Diagnosis not present

## 2016-04-18 DIAGNOSIS — I129 Hypertensive chronic kidney disease with stage 1 through stage 4 chronic kidney disease, or unspecified chronic kidney disease: Secondary | ICD-10-CM | POA: Diagnosis not present

## 2016-04-26 ENCOUNTER — Other Ambulatory Visit (HOSPITAL_COMMUNITY): Payer: Self-pay | Admitting: *Deleted

## 2016-04-27 ENCOUNTER — Encounter (HOSPITAL_COMMUNITY)
Admission: RE | Admit: 2016-04-27 | Discharge: 2016-04-27 | Disposition: A | Discharge status: Home / Self Care | Payer: Medicare Other | Source: Ambulatory Visit | Attending: Nephrology | Admitting: Nephrology

## 2016-04-27 DIAGNOSIS — D631 Anemia in chronic kidney disease: Secondary | ICD-10-CM

## 2016-04-27 DIAGNOSIS — N2581 Secondary hyperparathyroidism of renal origin: Secondary | ICD-10-CM | POA: Diagnosis not present

## 2016-04-27 DIAGNOSIS — Z940 Kidney transplant status: Secondary | ICD-10-CM | POA: Diagnosis not present

## 2016-04-27 DIAGNOSIS — N189 Chronic kidney disease, unspecified: Principal | ICD-10-CM

## 2016-04-27 DIAGNOSIS — N183 Chronic kidney disease, stage 3 (moderate): Secondary | ICD-10-CM | POA: Diagnosis not present

## 2016-04-27 LAB — MAGNESIUM: Magnesium: 1.4 mg/dL — ABNORMAL LOW (ref 1.7–2.4)

## 2016-04-27 LAB — URINALYSIS, ROUTINE W REFLEX MICROSCOPIC
Bilirubin Urine: NEGATIVE
GLUCOSE, UA: NEGATIVE mg/dL
Hgb urine dipstick: NEGATIVE
KETONES UR: NEGATIVE mg/dL
Nitrite: NEGATIVE
PROTEIN: 30 mg/dL — AB
Specific Gravity, Urine: 1.012 (ref 1.005–1.030)
pH: 6 (ref 5.0–8.0)

## 2016-04-27 LAB — POCT HEMOGLOBIN-HEMACUE: HEMOGLOBIN: 10.2 g/dL — AB (ref 13.0–17.0)

## 2016-04-27 LAB — RENAL FUNCTION PANEL
ANION GAP: 5 (ref 5–15)
Albumin: 2.9 g/dL — ABNORMAL LOW (ref 3.5–5.0)
BUN: 25 mg/dL — AB (ref 6–20)
CHLORIDE: 110 mmol/L (ref 101–111)
CO2: 20 mmol/L — ABNORMAL LOW (ref 22–32)
Calcium: 8.8 mg/dL — ABNORMAL LOW (ref 8.9–10.3)
Creatinine, Ser: 2.8 mg/dL — ABNORMAL HIGH (ref 0.61–1.24)
GFR calc Af Amer: 25 mL/min — ABNORMAL LOW (ref >60)
GFR, EST NON AFRICAN AMERICAN: 22 mL/min — AB (ref >60)
GLUCOSE: 98 mg/dL (ref 65–99)
Phosphorus: 3.1 mg/dL (ref 2.5–4.6)
Potassium: 4.9 mmol/L (ref 3.5–5.1)
Sodium: 135 mmol/L (ref 135–145)

## 2016-04-27 LAB — LIPID PANEL
CHOLESTEROL: 123 mg/dL (ref 0–200)
HDL: 50 mg/dL (ref >40)
LDL CALC: 45 mg/dL (ref 0–99)
TRIGLYCERIDES: 138 mg/dL (ref <150)
Total CHOL/HDL Ratio: 2.5 RATIO
VLDL: 28 mg/dL (ref 0–40)

## 2016-04-27 LAB — PHOSPHORUS: PHOSPHORUS: 3.1 mg/dL (ref 2.5–4.6)

## 2016-04-27 LAB — URIC ACID: URIC ACID, SERUM: 7.9 mg/dL — AB (ref 4.4–7.6)

## 2016-04-27 MED ORDER — EPOETIN ALFA 20000 UNIT/ML IJ SOLN
INTRAMUSCULAR | Status: AC
  Administered 2016-04-27: 15:00:00 20000 [IU]
  Filled 2016-04-27: qty 1

## 2016-04-27 MED ORDER — EPOETIN ALFA 10000 UNIT/ML IJ SOLN
INTRAMUSCULAR | Status: AC
  Administered 2016-04-27: 15:00:00 10000 [IU]
  Filled 2016-04-27: qty 1

## 2016-04-27 MED ORDER — EPOETIN ALFA 10000 UNIT/ML IJ SOLN: 30000.0000 [IU] | INTRAMUSCULAR | Status: DC

## 2016-04-29 LAB — TACROLIMUS LEVEL: TACROLIMUS (FK506) - LABCORP: 6.1 ng/mL (ref 2.0–20.0)

## 2016-05-17 ENCOUNTER — Other Ambulatory Visit (HOSPITAL_COMMUNITY): Payer: Self-pay | Admitting: *Deleted

## 2016-05-18 ENCOUNTER — Encounter (HOSPITAL_COMMUNITY)
Admission: RE | Admit: 2016-05-18 | Discharge: 2016-05-18 | Disposition: A | Discharge status: Home / Self Care | Payer: Medicare Other | Source: Ambulatory Visit | Attending: Nephrology | Admitting: Nephrology

## 2016-05-18 DIAGNOSIS — N183 Chronic kidney disease, stage 3 (moderate): Secondary | ICD-10-CM | POA: Insufficient documentation

## 2016-05-18 DIAGNOSIS — N2581 Secondary hyperparathyroidism of renal origin: Secondary | ICD-10-CM | POA: Diagnosis not present

## 2016-05-18 DIAGNOSIS — Z940 Kidney transplant status: Secondary | ICD-10-CM | POA: Insufficient documentation

## 2016-05-18 DIAGNOSIS — D631 Anemia in chronic kidney disease: Secondary | ICD-10-CM

## 2016-05-18 DIAGNOSIS — N189 Chronic kidney disease, unspecified: Secondary | ICD-10-CM

## 2016-05-18 LAB — POCT HEMOGLOBIN-HEMACUE: Hemoglobin: 9.6 g/dL — ABNORMAL LOW (ref 13.0–17.0)

## 2016-05-18 MED ORDER — EPOETIN ALFA 10000 UNIT/ML IJ SOLN
INTRAMUSCULAR | Status: AC
  Administered 2016-05-18: 15:00:00 10000 [IU] via SUBCUTANEOUS
  Filled 2016-05-18: qty 1

## 2016-05-18 MED ORDER — EPOETIN ALFA 10000 UNIT/ML IJ SOLN: 30000.0000 [IU] | INTRAMUSCULAR | Status: DC

## 2016-05-18 MED ORDER — EPOETIN ALFA 20000 UNIT/ML IJ SOLN
INTRAMUSCULAR | Status: AC
  Administered 2016-05-18: 15:00:00 20000 [IU] via SUBCUTANEOUS
  Filled 2016-05-18: qty 1

## 2016-05-21 ENCOUNTER — Encounter (HOSPITAL_COMMUNITY): Payer: Self-pay

## 2016-06-08 ENCOUNTER — Encounter (HOSPITAL_COMMUNITY)
Admission: RE | Admit: 2016-06-08 | Discharge: 2016-06-08 | Disposition: A | Discharge status: Home / Self Care | Payer: Medicare Other | Source: Ambulatory Visit | Attending: Nephrology | Admitting: Nephrology

## 2016-06-08 DIAGNOSIS — Z940 Kidney transplant status: Secondary | ICD-10-CM | POA: Diagnosis not present

## 2016-06-08 DIAGNOSIS — D631 Anemia in chronic kidney disease: Secondary | ICD-10-CM | POA: Diagnosis not present

## 2016-06-08 DIAGNOSIS — N183 Chronic kidney disease, stage 3 (moderate): Secondary | ICD-10-CM | POA: Diagnosis not present

## 2016-06-08 DIAGNOSIS — N2581 Secondary hyperparathyroidism of renal origin: Secondary | ICD-10-CM | POA: Insufficient documentation

## 2016-06-08 DIAGNOSIS — N189 Chronic kidney disease, unspecified: Secondary | ICD-10-CM

## 2016-06-08 LAB — IRON AND TIBC
Iron: 77 ug/dL (ref 45–182)
Saturation Ratios: 49 % — ABNORMAL HIGH (ref 17.9–39.5)
TIBC: 158 ug/dL — ABNORMAL LOW (ref 250–450)
UIBC: 81 ug/dL

## 2016-06-08 LAB — POCT HEMOGLOBIN-HEMACUE: Hemoglobin: 9 g/dL — ABNORMAL LOW (ref 13.0–17.0)

## 2016-06-08 LAB — FERRITIN: Ferritin: 578 ng/mL — ABNORMAL HIGH (ref 24–336)

## 2016-06-08 MED ORDER — EPOETIN ALFA 20000 UNIT/ML IJ SOLN
INTRAMUSCULAR | Status: AC
  Administered 2016-06-08: 20000 [IU] via SUBCUTANEOUS
  Filled 2016-06-08: qty 1

## 2016-06-08 MED ORDER — EPOETIN ALFA 10000 UNIT/ML IJ SOLN
INTRAMUSCULAR | Status: AC
  Administered 2016-06-08: 10000 [IU] via SUBCUTANEOUS
  Filled 2016-06-08: qty 1

## 2016-06-08 MED ORDER — EPOETIN ALFA 10000 UNIT/ML IJ SOLN: 30000.0000 [IU] | INTRAMUSCULAR | Status: DC

## 2016-06-09 LAB — PTH, INTACT AND CALCIUM
CALCIUM TOTAL (PTH): 8.6 mg/dL (ref 8.6–10.2)
PTH: 73 pg/mL — AB (ref 15–65)

## 2016-06-29 ENCOUNTER — Encounter (HOSPITAL_COMMUNITY): Payer: Self-pay

## 2016-07-06 ENCOUNTER — Encounter (HOSPITAL_COMMUNITY)
Admission: RE | Admit: 2016-07-06 | Discharge: 2016-07-06 | Disposition: A | Discharge status: Home / Self Care | Payer: Medicare Other | Source: Ambulatory Visit | Attending: Nephrology | Admitting: Nephrology

## 2016-07-06 DIAGNOSIS — D631 Anemia in chronic kidney disease: Secondary | ICD-10-CM

## 2016-07-06 DIAGNOSIS — N183 Chronic kidney disease, stage 3 (moderate): Secondary | ICD-10-CM | POA: Insufficient documentation

## 2016-07-06 DIAGNOSIS — N189 Chronic kidney disease, unspecified: Secondary | ICD-10-CM

## 2016-07-06 DIAGNOSIS — Z940 Kidney transplant status: Secondary | ICD-10-CM | POA: Insufficient documentation

## 2016-07-06 DIAGNOSIS — N2581 Secondary hyperparathyroidism of renal origin: Secondary | ICD-10-CM | POA: Insufficient documentation

## 2016-07-06 LAB — POCT HEMOGLOBIN-HEMACUE: Hemoglobin: 8.6 g/dL — ABNORMAL LOW (ref 13.0–17.0)

## 2016-07-06 MED ORDER — EPOETIN ALFA 10000 UNIT/ML IJ SOLN: 30000.0000 [IU] | INTRAMUSCULAR | Status: DC

## 2016-07-06 MED ORDER — EPOETIN ALFA 20000 UNIT/ML IJ SOLN
INTRAMUSCULAR | Status: AC
  Administered 2016-07-06: 15:00:00 20000 [IU] via SUBCUTANEOUS
  Filled 2016-07-06: qty 1

## 2016-07-06 MED ORDER — EPOETIN ALFA 10000 UNIT/ML IJ SOLN
INTRAMUSCULAR | Status: AC
  Administered 2016-07-06: 15:00:00 10000 [IU] via SUBCUTANEOUS
  Filled 2016-07-06: qty 1

## 2016-07-27 ENCOUNTER — Encounter (HOSPITAL_COMMUNITY)
Admission: RE | Admit: 2016-07-27 | Discharge: 2016-07-27 | Disposition: A | Discharge status: Home / Self Care | Payer: Medicare Other | Source: Ambulatory Visit | Attending: Nephrology | Admitting: Nephrology

## 2016-07-27 ENCOUNTER — Encounter (HOSPITAL_COMMUNITY): Payer: Self-pay

## 2016-07-27 DIAGNOSIS — D631 Anemia in chronic kidney disease: Secondary | ICD-10-CM | POA: Diagnosis not present

## 2016-07-27 DIAGNOSIS — N183 Chronic kidney disease, stage 3 (moderate): Secondary | ICD-10-CM | POA: Diagnosis not present

## 2016-07-27 DIAGNOSIS — N189 Chronic kidney disease, unspecified: Principal | ICD-10-CM

## 2016-07-27 DIAGNOSIS — N2581 Secondary hyperparathyroidism of renal origin: Secondary | ICD-10-CM | POA: Diagnosis not present

## 2016-07-27 DIAGNOSIS — Z940 Kidney transplant status: Secondary | ICD-10-CM | POA: Diagnosis not present

## 2016-07-27 LAB — POCT HEMOGLOBIN-HEMACUE: Hemoglobin: 9 g/dL — ABNORMAL LOW (ref 13.0–17.0)

## 2016-07-27 MED ORDER — EPOETIN ALFA 20000 UNIT/ML IJ SOLN
INTRAMUSCULAR | Status: AC
  Administered 2016-07-27: 11:00:00 20000 [IU] via SUBCUTANEOUS
  Filled 2016-07-27: qty 1

## 2016-07-27 MED ORDER — EPOETIN ALFA 10000 UNIT/ML IJ SOLN: 30000.0000 [IU] | INTRAMUSCULAR | Status: DC

## 2016-07-27 MED ORDER — EPOETIN ALFA 10000 UNIT/ML IJ SOLN
INTRAMUSCULAR | Status: AC
  Administered 2016-07-27: 10000 [IU] via SUBCUTANEOUS
  Filled 2016-07-27: qty 1

## 2016-08-16 DIAGNOSIS — D631 Anemia in chronic kidney disease: Secondary | ICD-10-CM | POA: Diagnosis not present

## 2016-08-16 DIAGNOSIS — D899 Disorder involving the immune mechanism, unspecified: Secondary | ICD-10-CM | POA: Diagnosis not present

## 2016-08-16 DIAGNOSIS — N529 Male erectile dysfunction, unspecified: Secondary | ICD-10-CM | POA: Diagnosis not present

## 2016-08-16 DIAGNOSIS — M109 Gout, unspecified: Secondary | ICD-10-CM | POA: Diagnosis not present

## 2016-08-16 DIAGNOSIS — I129 Hypertensive chronic kidney disease with stage 1 through stage 4 chronic kidney disease, or unspecified chronic kidney disease: Secondary | ICD-10-CM | POA: Diagnosis not present

## 2016-08-16 DIAGNOSIS — Z940 Kidney transplant status: Secondary | ICD-10-CM | POA: Diagnosis not present

## 2016-08-16 DIAGNOSIS — N2581 Secondary hyperparathyroidism of renal origin: Secondary | ICD-10-CM | POA: Diagnosis not present

## 2016-08-16 DIAGNOSIS — E872 Acidosis: Secondary | ICD-10-CM | POA: Diagnosis not present

## 2016-08-17 ENCOUNTER — Encounter (HOSPITAL_COMMUNITY)
Admission: RE | Admit: 2016-08-17 | Discharge: 2016-08-17 | Disposition: A | Discharge status: Home / Self Care | Payer: Medicare Other | Source: Ambulatory Visit | Attending: Nephrology | Admitting: Nephrology

## 2016-08-17 DIAGNOSIS — Z940 Kidney transplant status: Secondary | ICD-10-CM | POA: Diagnosis not present

## 2016-08-17 DIAGNOSIS — N2581 Secondary hyperparathyroidism of renal origin: Secondary | ICD-10-CM | POA: Insufficient documentation

## 2016-08-17 DIAGNOSIS — D631 Anemia in chronic kidney disease: Secondary | ICD-10-CM

## 2016-08-17 DIAGNOSIS — N183 Chronic kidney disease, stage 3 (moderate): Secondary | ICD-10-CM | POA: Diagnosis not present

## 2016-08-17 DIAGNOSIS — N189 Chronic kidney disease, unspecified: Secondary | ICD-10-CM

## 2016-08-17 LAB — COMPREHENSIVE METABOLIC PANEL
ALBUMIN: 2.9 g/dL — AB (ref 3.5–5.0)
ALK PHOS: 75 U/L (ref 38–126)
ALT: 10 U/L — AB (ref 17–63)
AST: 20 U/L (ref 15–41)
Anion gap: 6 (ref 5–15)
BUN: 29 mg/dL — AB (ref 6–20)
CALCIUM: 8.4 mg/dL — AB (ref 8.9–10.3)
CHLORIDE: 111 mmol/L (ref 101–111)
CO2: 19 mmol/L — AB (ref 22–32)
CREATININE: 3.14 mg/dL — AB (ref 0.61–1.24)
GFR calc Af Amer: 22 mL/min — ABNORMAL LOW (ref >60)
GFR calc non Af Amer: 19 mL/min — ABNORMAL LOW (ref >60)
GLUCOSE: 102 mg/dL — AB (ref 65–99)
Potassium: 4.1 mmol/L (ref 3.5–5.1)
SODIUM: 136 mmol/L (ref 135–145)
Total Bilirubin: 0.7 mg/dL (ref 0.3–1.2)
Total Protein: 7.9 g/dL (ref 6.5–8.1)

## 2016-08-17 LAB — LIPID PANEL
Cholesterol: 123 mg/dL (ref 0–200)
HDL: 44 mg/dL (ref >40)
LDL CALC: 27 mg/dL (ref 0–99)
TRIGLYCERIDES: 258 mg/dL — AB (ref <150)
Total CHOL/HDL Ratio: 2.8 RATIO
VLDL: 52 mg/dL — AB (ref 0–40)

## 2016-08-17 LAB — IRON AND TIBC
IRON: 86 ug/dL (ref 45–182)
SATURATION RATIOS: 55 % — AB (ref 17.9–39.5)
TIBC: 157 ug/dL — ABNORMAL LOW (ref 250–450)
UIBC: 71 ug/dL

## 2016-08-17 LAB — FERRITIN: Ferritin: 546 ng/mL — ABNORMAL HIGH (ref 24–336)

## 2016-08-17 LAB — POCT HEMOGLOBIN-HEMACUE: Hemoglobin: 8.5 g/dL — ABNORMAL LOW (ref 13.0–17.0)

## 2016-08-17 MED ORDER — EPOETIN ALFA 20000 UNIT/ML IJ SOLN
INTRAMUSCULAR | Status: AC
  Administered 2016-08-17: 20000 [IU]
  Filled 2016-08-17: qty 1

## 2016-08-17 MED ORDER — EPOETIN ALFA 10000 UNIT/ML IJ SOLN
INTRAMUSCULAR | Status: AC
  Administered 2016-08-17: 15:00:00 10000 [IU]
  Filled 2016-08-17: qty 1

## 2016-08-17 MED ORDER — EPOETIN ALFA 10000 UNIT/ML IJ SOLN: 30000.0000 [IU] | INTRAMUSCULAR | Status: DC

## 2016-08-18 LAB — PTH, INTACT AND CALCIUM
Calcium, Total (PTH): 8.3 mg/dL — ABNORMAL LOW (ref 8.6–10.2)
PTH: 121 pg/mL — AB (ref 15–65)

## 2016-08-19 LAB — TACROLIMUS LEVEL: Tacrolimus (FK506) - LabCorp: 5.4 ng/mL (ref 2.0–20.0)

## 2016-09-07 ENCOUNTER — Encounter (HOSPITAL_COMMUNITY)
Admission: RE | Admit: 2016-09-07 | Discharge: 2016-09-07 | Disposition: A | Discharge status: Home / Self Care | Payer: Medicare Other | Source: Ambulatory Visit | Attending: Nephrology | Admitting: Nephrology

## 2016-09-07 DIAGNOSIS — Z940 Kidney transplant status: Secondary | ICD-10-CM | POA: Diagnosis not present

## 2016-09-07 DIAGNOSIS — D631 Anemia in chronic kidney disease: Secondary | ICD-10-CM

## 2016-09-07 DIAGNOSIS — N2581 Secondary hyperparathyroidism of renal origin: Secondary | ICD-10-CM | POA: Insufficient documentation

## 2016-09-07 DIAGNOSIS — N189 Chronic kidney disease, unspecified: Secondary | ICD-10-CM

## 2016-09-07 DIAGNOSIS — N183 Chronic kidney disease, stage 3 (moderate): Secondary | ICD-10-CM | POA: Diagnosis not present

## 2016-09-07 LAB — POCT HEMOGLOBIN-HEMACUE: HEMOGLOBIN: 9.6 g/dL — AB (ref 13.0–17.0)

## 2016-09-07 MED ORDER — EPOETIN ALFA 10000 UNIT/ML IJ SOLN
INTRAMUSCULAR | Status: AC
Start: 2016-09-07 — End: 2016-09-07
  Administered 2016-09-07: 10000 [IU]
  Filled 2016-09-07: qty 1

## 2016-09-07 MED ORDER — EPOETIN ALFA 10000 UNIT/ML IJ SOLN: 30000.0000 [IU] | INTRAMUSCULAR | Status: DC

## 2016-09-07 MED ORDER — EPOETIN ALFA 20000 UNIT/ML IJ SOLN
INTRAMUSCULAR | Status: AC
Start: 2016-09-07 — End: 2016-09-07
  Administered 2016-09-07: 15:00:00 20000 [IU]
  Filled 2016-09-07: qty 1

## 2016-09-28 ENCOUNTER — Encounter (HOSPITAL_COMMUNITY)
Admission: RE | Admit: 2016-09-28 | Discharge: 2016-09-28 | Disposition: A | Discharge status: Home / Self Care | Payer: Medicare Other | Source: Ambulatory Visit | Attending: Nephrology | Admitting: Nephrology

## 2016-09-28 DIAGNOSIS — Z940 Kidney transplant status: Secondary | ICD-10-CM | POA: Diagnosis not present

## 2016-09-28 DIAGNOSIS — D631 Anemia in chronic kidney disease: Secondary | ICD-10-CM

## 2016-09-28 DIAGNOSIS — N183 Chronic kidney disease, stage 3 (moderate): Secondary | ICD-10-CM | POA: Diagnosis not present

## 2016-09-28 DIAGNOSIS — N2581 Secondary hyperparathyroidism of renal origin: Secondary | ICD-10-CM | POA: Diagnosis not present

## 2016-09-28 DIAGNOSIS — N189 Chronic kidney disease, unspecified: Principal | ICD-10-CM

## 2016-09-28 LAB — POCT HEMOGLOBIN-HEMACUE: HEMOGLOBIN: 9.2 g/dL — AB (ref 13.0–17.0)

## 2016-09-28 MED ORDER — CLONIDINE HCL 0.1 MG PO TABS: 0.1000 mg | ORAL_TABLET | Freq: Once | ORAL | Status: DC | PRN

## 2016-09-28 MED ORDER — EPOETIN ALFA 20000 UNIT/ML IJ SOLN
INTRAMUSCULAR | Status: AC
  Administered 2016-09-28: 20000 [IU]
  Filled 2016-09-28: qty 1

## 2016-09-28 MED ORDER — EPOETIN ALFA 10000 UNIT/ML IJ SOLN: 30000.0000 [IU] | INTRAMUSCULAR | Status: DC

## 2016-09-28 MED ORDER — EPOETIN ALFA 10000 UNIT/ML IJ SOLN
INTRAMUSCULAR | Status: AC
  Administered 2016-09-28: 10000 [IU]
  Filled 2016-09-28: qty 1

## 2016-10-19 ENCOUNTER — Encounter (HOSPITAL_COMMUNITY)
Admission: RE | Admit: 2016-10-19 | Discharge: 2016-10-19 | Disposition: A | Discharge status: Home / Self Care | Payer: Medicare Other | Source: Ambulatory Visit | Attending: Nephrology | Admitting: Nephrology

## 2016-10-19 DIAGNOSIS — N189 Chronic kidney disease, unspecified: Secondary | ICD-10-CM

## 2016-10-19 DIAGNOSIS — D631 Anemia in chronic kidney disease: Secondary | ICD-10-CM | POA: Insufficient documentation

## 2016-10-19 DIAGNOSIS — N183 Chronic kidney disease, stage 3 (moderate): Secondary | ICD-10-CM | POA: Insufficient documentation

## 2016-10-19 DIAGNOSIS — N2581 Secondary hyperparathyroidism of renal origin: Secondary | ICD-10-CM | POA: Insufficient documentation

## 2016-10-19 DIAGNOSIS — Z940 Kidney transplant status: Secondary | ICD-10-CM | POA: Diagnosis not present

## 2016-10-19 LAB — POCT HEMOGLOBIN-HEMACUE: HEMOGLOBIN: 9.5 g/dL — AB (ref 13.0–17.0)

## 2016-10-19 MED ORDER — EPOETIN ALFA 10000 UNIT/ML IJ SOLN: 30000.0000 [IU] | INTRAMUSCULAR | Status: DC

## 2016-10-19 MED ORDER — EPOETIN ALFA 20000 UNIT/ML IJ SOLN
INTRAMUSCULAR | Status: AC
  Administered 2016-10-19: 20000 [IU] via SUBCUTANEOUS
  Filled 2016-10-19: qty 1

## 2016-10-19 MED ORDER — EPOETIN ALFA 10000 UNIT/ML IJ SOLN
INTRAMUSCULAR | Status: AC
  Administered 2016-10-19: 10000 [IU] via SUBCUTANEOUS
  Filled 2016-10-19: qty 1

## 2016-11-09 ENCOUNTER — Encounter (HOSPITAL_COMMUNITY)
Admission: RE | Admit: 2016-11-09 | Discharge: 2016-11-09 | Disposition: A | Discharge status: Home / Self Care | Payer: Medicare Other | Source: Ambulatory Visit | Attending: Nephrology | Admitting: Nephrology

## 2016-11-09 DIAGNOSIS — N189 Chronic kidney disease, unspecified: Secondary | ICD-10-CM

## 2016-11-09 DIAGNOSIS — N2581 Secondary hyperparathyroidism of renal origin: Secondary | ICD-10-CM | POA: Diagnosis not present

## 2016-11-09 DIAGNOSIS — D631 Anemia in chronic kidney disease: Secondary | ICD-10-CM | POA: Insufficient documentation

## 2016-11-09 DIAGNOSIS — N183 Chronic kidney disease, stage 3 (moderate): Secondary | ICD-10-CM | POA: Insufficient documentation

## 2016-11-09 DIAGNOSIS — Z940 Kidney transplant status: Secondary | ICD-10-CM | POA: Diagnosis not present

## 2016-11-09 LAB — POCT HEMOGLOBIN-HEMACUE: HEMOGLOBIN: 8.5 g/dL — AB (ref 13.0–17.0)

## 2016-11-09 MED ORDER — EPOETIN ALFA 20000 UNIT/ML IJ SOLN
INTRAMUSCULAR | Status: AC
  Administered 2016-11-09: 14:00:00 20000 [IU] via SUBCUTANEOUS
  Filled 2016-11-09: qty 1

## 2016-11-09 MED ORDER — EPOETIN ALFA 10000 UNIT/ML IJ SOLN: 30000.0000 [IU] | INTRAMUSCULAR | Status: DC

## 2016-11-09 MED ORDER — EPOETIN ALFA 10000 UNIT/ML IJ SOLN
INTRAMUSCULAR | Status: AC
  Administered 2016-11-09: 14:00:00 10000 [IU] via SUBCUTANEOUS
  Filled 2016-11-09: qty 1

## 2016-11-30 ENCOUNTER — Encounter (HOSPITAL_COMMUNITY)
Admission: RE | Admit: 2016-11-30 | Discharge: 2016-11-30 | Disposition: A | Discharge status: Home / Self Care | Payer: Medicare Other | Source: Ambulatory Visit | Attending: Nephrology | Admitting: Nephrology

## 2016-11-30 DIAGNOSIS — D631 Anemia in chronic kidney disease: Secondary | ICD-10-CM

## 2016-11-30 DIAGNOSIS — Z940 Kidney transplant status: Secondary | ICD-10-CM | POA: Diagnosis not present

## 2016-11-30 DIAGNOSIS — N189 Chronic kidney disease, unspecified: Principal | ICD-10-CM

## 2016-11-30 DIAGNOSIS — N183 Chronic kidney disease, stage 3 (moderate): Secondary | ICD-10-CM | POA: Diagnosis not present

## 2016-11-30 DIAGNOSIS — N2581 Secondary hyperparathyroidism of renal origin: Secondary | ICD-10-CM | POA: Diagnosis not present

## 2016-11-30 LAB — COMPREHENSIVE METABOLIC PANEL
ALK PHOS: 62 U/L (ref 38–126)
ALT: 13 U/L — AB (ref 17–63)
AST: 22 U/L (ref 15–41)
Albumin: 2.9 g/dL — ABNORMAL LOW (ref 3.5–5.0)
Anion gap: 3 — ABNORMAL LOW (ref 5–15)
BUN: 21 mg/dL — ABNORMAL HIGH (ref 6–20)
CALCIUM: 8.4 mg/dL — AB (ref 8.9–10.3)
CO2: 23 mmol/L (ref 22–32)
CREATININE: 2.49 mg/dL — AB (ref 0.61–1.24)
Chloride: 111 mmol/L (ref 101–111)
GFR, EST AFRICAN AMERICAN: 29 mL/min — AB (ref >60)
GFR, EST NON AFRICAN AMERICAN: 25 mL/min — AB (ref >60)
Glucose, Bld: 105 mg/dL — ABNORMAL HIGH (ref 65–99)
Potassium: 3.9 mmol/L (ref 3.5–5.1)
Sodium: 137 mmol/L (ref 135–145)
Total Bilirubin: 0.5 mg/dL (ref 0.3–1.2)
Total Protein: 8.1 g/dL (ref 6.5–8.1)

## 2016-11-30 LAB — LIPID PANEL
Cholesterol: 128 mg/dL (ref 0–200)
HDL: 48 mg/dL (ref >40)
LDL Cholesterol: 61 mg/dL (ref 0–99)
Total CHOL/HDL Ratio: 2.7 RATIO
Triglycerides: 93 mg/dL (ref <150)
VLDL: 19 mg/dL (ref 0–40)

## 2016-11-30 LAB — POCT HEMOGLOBIN-HEMACUE: HEMOGLOBIN: 8.6 g/dL — AB (ref 13.0–17.0)

## 2016-11-30 LAB — IRON AND TIBC
IRON: 83 ug/dL (ref 45–182)
Saturation Ratios: 52 % — ABNORMAL HIGH (ref 17.9–39.5)
TIBC: 160 ug/dL — AB (ref 250–450)
UIBC: 77 ug/dL

## 2016-11-30 LAB — PHOSPHORUS: PHOSPHORUS: 2.7 mg/dL (ref 2.5–4.6)

## 2016-11-30 LAB — MAGNESIUM: Magnesium: 1.4 mg/dL — ABNORMAL LOW (ref 1.7–2.4)

## 2016-11-30 LAB — FERRITIN: Ferritin: 474 ng/mL — ABNORMAL HIGH (ref 24–336)

## 2016-11-30 MED ORDER — EPOETIN ALFA 20000 UNIT/ML IJ SOLN
INTRAMUSCULAR | Status: AC
  Administered 2016-11-30: 11:00:00 20000 [IU] via SUBCUTANEOUS
  Filled 2016-11-30: qty 1

## 2016-11-30 MED ORDER — EPOETIN ALFA 10000 UNIT/ML IJ SOLN
INTRAMUSCULAR | Status: AC
  Administered 2016-11-30: 11:00:00 10000 [IU] via SUBCUTANEOUS
  Filled 2016-11-30: qty 1

## 2016-11-30 MED ORDER — EPOETIN ALFA 10000 UNIT/ML IJ SOLN: 30000.0000 [IU] | INTRAMUSCULAR | Status: DC

## 2016-12-01 LAB — PTH, INTACT AND CALCIUM
Calcium, Total (PTH): 8.4 mg/dL — ABNORMAL LOW (ref 8.6–10.2)
PTH: 74 pg/mL — AB (ref 15–65)

## 2016-12-06 DIAGNOSIS — M109 Gout, unspecified: Secondary | ICD-10-CM | POA: Diagnosis not present

## 2016-12-06 DIAGNOSIS — N529 Male erectile dysfunction, unspecified: Secondary | ICD-10-CM | POA: Diagnosis not present

## 2016-12-06 DIAGNOSIS — I129 Hypertensive chronic kidney disease with stage 1 through stage 4 chronic kidney disease, or unspecified chronic kidney disease: Secondary | ICD-10-CM | POA: Diagnosis not present

## 2016-12-06 DIAGNOSIS — D899 Disorder involving the immune mechanism, unspecified: Secondary | ICD-10-CM | POA: Diagnosis not present

## 2016-12-06 DIAGNOSIS — Z940 Kidney transplant status: Secondary | ICD-10-CM | POA: Diagnosis not present

## 2016-12-06 DIAGNOSIS — E872 Acidosis: Secondary | ICD-10-CM | POA: Diagnosis not present

## 2016-12-06 DIAGNOSIS — N2581 Secondary hyperparathyroidism of renal origin: Secondary | ICD-10-CM | POA: Diagnosis not present

## 2016-12-06 DIAGNOSIS — D631 Anemia in chronic kidney disease: Secondary | ICD-10-CM | POA: Diagnosis not present

## 2016-12-21 ENCOUNTER — Encounter (HOSPITAL_COMMUNITY)
Admission: RE | Admit: 2016-12-21 | Discharge: 2016-12-21 | Disposition: A | Discharge status: Home / Self Care | Payer: Medicare Other | Source: Ambulatory Visit | Attending: Nephrology | Admitting: Nephrology

## 2016-12-21 DIAGNOSIS — N2581 Secondary hyperparathyroidism of renal origin: Secondary | ICD-10-CM | POA: Diagnosis not present

## 2016-12-21 DIAGNOSIS — N183 Chronic kidney disease, stage 3 (moderate): Secondary | ICD-10-CM | POA: Insufficient documentation

## 2016-12-21 DIAGNOSIS — D631 Anemia in chronic kidney disease: Secondary | ICD-10-CM | POA: Insufficient documentation

## 2016-12-21 DIAGNOSIS — Z940 Kidney transplant status: Secondary | ICD-10-CM | POA: Diagnosis not present

## 2016-12-21 DIAGNOSIS — N189 Chronic kidney disease, unspecified: Secondary | ICD-10-CM

## 2016-12-21 LAB — POCT HEMOGLOBIN-HEMACUE: Hemoglobin: 8.7 g/dL — ABNORMAL LOW (ref 13.0–17.0)

## 2016-12-21 MED ORDER — DARBEPOETIN ALFA 150 MCG/0.3ML IJ SOSY
PREFILLED_SYRINGE | INTRAMUSCULAR | Status: AC
  Administered 2016-12-21: 15:00:00 300 ug via SUBCUTANEOUS
  Filled 2016-12-21: qty 0.3

## 2016-12-21 MED ORDER — DARBEPOETIN ALFA 300 MCG/0.6ML IJ SOSY
300.0000 ug | PREFILLED_SYRINGE | INTRAMUSCULAR | Status: DC
  Administered 2016-12-21: 300 ug via SUBCUTANEOUS

## 2016-12-23 LAB — TACROLIMUS LEVEL: Tacrolimus (FK506) - LabCorp: 2.7 ng/mL (ref 2.0–20.0)

## 2017-01-11 ENCOUNTER — Encounter (HOSPITAL_COMMUNITY)
Admission: RE | Admit: 2017-01-11 | Discharge: 2017-01-11 | Disposition: A | Discharge status: Home / Self Care | Payer: Medicare Other | Source: Ambulatory Visit | Attending: Nephrology | Admitting: Nephrology

## 2017-01-11 VITALS — BP 186/80 | HR 73 | Temp 97.7°F | Resp 18

## 2017-01-11 DIAGNOSIS — Z940 Kidney transplant status: Secondary | ICD-10-CM | POA: Diagnosis not present

## 2017-01-11 DIAGNOSIS — N189 Chronic kidney disease, unspecified: Secondary | ICD-10-CM

## 2017-01-11 DIAGNOSIS — N2581 Secondary hyperparathyroidism of renal origin: Secondary | ICD-10-CM | POA: Diagnosis not present

## 2017-01-11 DIAGNOSIS — N183 Chronic kidney disease, stage 3 (moderate): Secondary | ICD-10-CM | POA: Insufficient documentation

## 2017-01-11 DIAGNOSIS — D631 Anemia in chronic kidney disease: Secondary | ICD-10-CM

## 2017-01-11 LAB — POCT HEMOGLOBIN-HEMACUE: Hemoglobin: 10.1 g/dL — ABNORMAL LOW (ref 13.0–17.0)

## 2017-01-11 MED ORDER — DARBEPOETIN ALFA 150 MCG/0.3ML IJ SOSY
PREFILLED_SYRINGE | INTRAMUSCULAR | Status: AC
  Administered 2017-01-11: 300 ug via SUBCUTANEOUS
  Filled 2017-01-11: qty 0.6

## 2017-01-11 MED ORDER — DARBEPOETIN ALFA 300 MCG/0.6ML IJ SOSY
300.0000 ug | PREFILLED_SYRINGE | INTRAMUSCULAR | Status: DC
  Administered 2017-01-11: 300 ug via SUBCUTANEOUS
  Filled 2017-01-11: qty 0.6

## 2017-02-01 ENCOUNTER — Encounter (HOSPITAL_COMMUNITY)
Admission: RE | Admit: 2017-02-01 | Discharge: 2017-02-01 | Disposition: A | Discharge status: Home / Self Care | Payer: Medicare Other | Source: Ambulatory Visit | Attending: Nephrology | Admitting: Nephrology

## 2017-02-01 VITALS — BP 164/74 | HR 77 | Temp 98.0°F | Resp 18

## 2017-02-01 DIAGNOSIS — Z940 Kidney transplant status: Secondary | ICD-10-CM | POA: Diagnosis not present

## 2017-02-01 DIAGNOSIS — D631 Anemia in chronic kidney disease: Secondary | ICD-10-CM | POA: Diagnosis not present

## 2017-02-01 DIAGNOSIS — N2581 Secondary hyperparathyroidism of renal origin: Secondary | ICD-10-CM | POA: Diagnosis not present

## 2017-02-01 DIAGNOSIS — N183 Chronic kidney disease, stage 3 (moderate): Secondary | ICD-10-CM | POA: Diagnosis not present

## 2017-02-01 DIAGNOSIS — N189 Chronic kidney disease, unspecified: Principal | ICD-10-CM

## 2017-02-01 LAB — LIPID PANEL
CHOL/HDL RATIO: 3 ratio
CHOLESTEROL: 126 mg/dL (ref 0–200)
HDL: 42 mg/dL (ref >40)
LDL Cholesterol: 46 mg/dL (ref 0–99)
Triglycerides: 192 mg/dL — ABNORMAL HIGH (ref <150)
VLDL: 38 mg/dL (ref 0–40)

## 2017-02-01 LAB — RENAL FUNCTION PANEL
ALBUMIN: 2.9 g/dL — AB (ref 3.5–5.0)
Anion gap: 3 — ABNORMAL LOW (ref 5–15)
BUN: 35 mg/dL — AB (ref 6–20)
CHLORIDE: 108 mmol/L (ref 101–111)
CO2: 23 mmol/L (ref 22–32)
CREATININE: 3.14 mg/dL — AB (ref 0.61–1.24)
Calcium: 8.6 mg/dL — ABNORMAL LOW (ref 8.9–10.3)
GFR, EST AFRICAN AMERICAN: 22 mL/min — AB (ref >60)
GFR, EST NON AFRICAN AMERICAN: 19 mL/min — AB (ref >60)
Glucose, Bld: 88 mg/dL (ref 65–99)
PHOSPHORUS: 2.4 mg/dL — AB (ref 2.5–4.6)
POTASSIUM: 5.4 mmol/L — AB (ref 3.5–5.1)
Sodium: 134 mmol/L — ABNORMAL LOW (ref 135–145)

## 2017-02-01 LAB — MAGNESIUM: MAGNESIUM: 1.4 mg/dL — AB (ref 1.7–2.4)

## 2017-02-01 LAB — COMPREHENSIVE METABOLIC PANEL
ALK PHOS: 68 U/L (ref 38–126)
ALT: 17 U/L (ref 17–63)
ANION GAP: 3 — AB (ref 5–15)
AST: 27 U/L (ref 15–41)
Albumin: 2.9 g/dL — ABNORMAL LOW (ref 3.5–5.0)
BILIRUBIN TOTAL: 0.5 mg/dL (ref 0.3–1.2)
BUN: 35 mg/dL — ABNORMAL HIGH (ref 6–20)
CALCIUM: 8.5 mg/dL — AB (ref 8.9–10.3)
CO2: 23 mmol/L (ref 22–32)
Chloride: 108 mmol/L (ref 101–111)
Creatinine, Ser: 3.16 mg/dL — ABNORMAL HIGH (ref 0.61–1.24)
GFR calc Af Amer: 22 mL/min — ABNORMAL LOW (ref >60)
GFR, EST NON AFRICAN AMERICAN: 19 mL/min — AB (ref >60)
Glucose, Bld: 87 mg/dL (ref 65–99)
POTASSIUM: 5.4 mmol/L — AB (ref 3.5–5.1)
Sodium: 134 mmol/L — ABNORMAL LOW (ref 135–145)
TOTAL PROTEIN: 8 g/dL (ref 6.5–8.1)

## 2017-02-01 LAB — POCT HEMOGLOBIN-HEMACUE: Hemoglobin: 11.4 g/dL — ABNORMAL LOW (ref 13.0–17.0)

## 2017-02-01 LAB — IRON AND TIBC
IRON: 55 ug/dL (ref 45–182)
Saturation Ratios: 36 % (ref 17.9–39.5)
TIBC: 151 ug/dL — AB (ref 250–450)
UIBC: 96 ug/dL

## 2017-02-01 LAB — FERRITIN: FERRITIN: 538 ng/mL — AB (ref 24–336)

## 2017-02-01 LAB — PHOSPHORUS: PHOSPHORUS: 2.3 mg/dL — AB (ref 2.5–4.6)

## 2017-02-01 MED ORDER — DARBEPOETIN ALFA 300 MCG/0.6ML IJ SOSY
300.0000 ug | PREFILLED_SYRINGE | INTRAMUSCULAR | Status: DC
  Filled 2017-02-01: qty 0.6

## 2017-02-01 MED ORDER — DARBEPOETIN ALFA 200 MCG/0.4ML IJ SOSY
PREFILLED_SYRINGE | INTRAMUSCULAR | Status: AC
  Administered 2017-02-01: 15:00:00 200 ug
  Filled 2017-02-01: qty 0.4

## 2017-02-01 MED ORDER — DARBEPOETIN ALFA 100 MCG/0.5ML IJ SOSY
PREFILLED_SYRINGE | INTRAMUSCULAR | Status: AC
  Administered 2017-02-01: 100 ug
  Filled 2017-02-01: qty 0.5

## 2017-02-22 ENCOUNTER — Encounter (HOSPITAL_COMMUNITY)
Admission: RE | Admit: 2017-02-22 | Discharge: 2017-02-22 | Disposition: A | Discharge status: Home / Self Care | Payer: Medicare Other | Source: Ambulatory Visit | Attending: Nephrology | Admitting: Nephrology

## 2017-02-22 VITALS — BP 150/79 | HR 76 | Temp 98.1°F | Resp 18

## 2017-02-22 DIAGNOSIS — N189 Chronic kidney disease, unspecified: Secondary | ICD-10-CM

## 2017-02-22 DIAGNOSIS — N2581 Secondary hyperparathyroidism of renal origin: Secondary | ICD-10-CM | POA: Insufficient documentation

## 2017-02-22 DIAGNOSIS — Z940 Kidney transplant status: Secondary | ICD-10-CM | POA: Diagnosis not present

## 2017-02-22 DIAGNOSIS — D631 Anemia in chronic kidney disease: Secondary | ICD-10-CM | POA: Diagnosis not present

## 2017-02-22 DIAGNOSIS — N183 Chronic kidney disease, stage 3 (moderate): Secondary | ICD-10-CM | POA: Insufficient documentation

## 2017-02-22 LAB — POCT HEMOGLOBIN-HEMACUE: HEMOGLOBIN: 11.8 g/dL — AB (ref 13.0–17.0)

## 2017-02-22 MED ORDER — DARBEPOETIN ALFA 300 MCG/0.6ML IJ SOSY
300.0000 ug | PREFILLED_SYRINGE | INTRAMUSCULAR | Status: DC
  Administered 2017-02-22: 300 ug via SUBCUTANEOUS
  Filled 2017-02-22: qty 0.6

## 2017-02-22 MED ORDER — DARBEPOETIN ALFA 150 MCG/0.3ML IJ SOSY
PREFILLED_SYRINGE | INTRAMUSCULAR | Status: AC
  Administered 2017-02-22: 300 ug via SUBCUTANEOUS
  Filled 2017-02-22: qty 0.6

## 2017-03-15 ENCOUNTER — Encounter (HOSPITAL_COMMUNITY)
Admission: RE | Admit: 2017-03-15 | Discharge: 2017-03-15 | Disposition: A | Discharge status: Home / Self Care | Payer: Medicare Other | Source: Ambulatory Visit | Attending: Nephrology | Admitting: Nephrology

## 2017-03-15 VITALS — BP 144/61 | HR 74 | Resp 18

## 2017-03-15 DIAGNOSIS — N2581 Secondary hyperparathyroidism of renal origin: Secondary | ICD-10-CM | POA: Insufficient documentation

## 2017-03-15 DIAGNOSIS — D631 Anemia in chronic kidney disease: Secondary | ICD-10-CM | POA: Insufficient documentation

## 2017-03-15 DIAGNOSIS — N183 Chronic kidney disease, stage 3 (moderate): Secondary | ICD-10-CM | POA: Insufficient documentation

## 2017-03-15 DIAGNOSIS — Z940 Kidney transplant status: Secondary | ICD-10-CM | POA: Diagnosis not present

## 2017-03-15 DIAGNOSIS — N189 Chronic kidney disease, unspecified: Secondary | ICD-10-CM

## 2017-03-15 LAB — POCT HEMOGLOBIN-HEMACUE: HEMOGLOBIN: 11.6 g/dL — AB (ref 13.0–17.0)

## 2017-03-15 MED ORDER — DARBEPOETIN ALFA 150 MCG/0.3ML IJ SOSY
PREFILLED_SYRINGE | INTRAMUSCULAR | Status: AC
  Filled 2017-03-15: qty 0.6

## 2017-03-15 MED ORDER — DARBEPOETIN ALFA 300 MCG/0.6ML IJ SOSY
300.0000 ug | PREFILLED_SYRINGE | INTRAMUSCULAR | Status: DC
  Administered 2017-03-15: 14:00:00 300 ug via SUBCUTANEOUS
  Filled 2017-03-15: qty 0.6

## 2017-03-16 LAB — PTH, INTACT AND CALCIUM
CALCIUM TOTAL (PTH): 8.2 mg/dL — AB (ref 8.6–10.2)
PTH: 37 pg/mL (ref 15–65)

## 2017-03-29 ENCOUNTER — Encounter (HOSPITAL_COMMUNITY)
Admission: RE | Admit: 2017-03-29 | Discharge: 2017-03-29 | Disposition: A | Discharge status: Home / Self Care | Payer: Medicare Other | Source: Ambulatory Visit | Attending: Nephrology | Admitting: Nephrology

## 2017-03-29 VITALS — BP 149/62 | HR 74 | Temp 97.9°F | Resp 18

## 2017-03-29 DIAGNOSIS — D631 Anemia in chronic kidney disease: Secondary | ICD-10-CM | POA: Diagnosis not present

## 2017-03-29 DIAGNOSIS — N2581 Secondary hyperparathyroidism of renal origin: Secondary | ICD-10-CM | POA: Diagnosis not present

## 2017-03-29 DIAGNOSIS — N189 Chronic kidney disease, unspecified: Principal | ICD-10-CM

## 2017-03-29 DIAGNOSIS — N183 Chronic kidney disease, stage 3 (moderate): Secondary | ICD-10-CM | POA: Diagnosis not present

## 2017-03-29 DIAGNOSIS — Z940 Kidney transplant status: Secondary | ICD-10-CM | POA: Diagnosis not present

## 2017-03-29 LAB — POCT HEMOGLOBIN-HEMACUE: Hemoglobin: 12.4 g/dL — ABNORMAL LOW (ref 13.0–17.0)

## 2017-03-29 MED ORDER — DARBEPOETIN ALFA 300 MCG/0.6ML IJ SOSY
300.0000 ug | PREFILLED_SYRINGE | INTRAMUSCULAR | Status: DC
  Filled 2017-03-29: qty 0.6

## 2017-04-09 DIAGNOSIS — D899 Disorder involving the immune mechanism, unspecified: Secondary | ICD-10-CM | POA: Diagnosis not present

## 2017-04-09 DIAGNOSIS — E872 Acidosis: Secondary | ICD-10-CM | POA: Diagnosis not present

## 2017-04-09 DIAGNOSIS — N2581 Secondary hyperparathyroidism of renal origin: Secondary | ICD-10-CM | POA: Diagnosis not present

## 2017-04-09 DIAGNOSIS — D631 Anemia in chronic kidney disease: Secondary | ICD-10-CM | POA: Diagnosis not present

## 2017-04-09 DIAGNOSIS — Z940 Kidney transplant status: Secondary | ICD-10-CM | POA: Diagnosis not present

## 2017-04-09 DIAGNOSIS — M109 Gout, unspecified: Secondary | ICD-10-CM | POA: Diagnosis not present

## 2017-04-09 DIAGNOSIS — N529 Male erectile dysfunction, unspecified: Secondary | ICD-10-CM | POA: Diagnosis not present

## 2017-04-09 DIAGNOSIS — I129 Hypertensive chronic kidney disease with stage 1 through stage 4 chronic kidney disease, or unspecified chronic kidney disease: Secondary | ICD-10-CM | POA: Diagnosis not present

## 2017-04-12 ENCOUNTER — Encounter (HOSPITAL_COMMUNITY): Payer: Self-pay

## 2017-04-26 ENCOUNTER — Encounter (HOSPITAL_COMMUNITY)
Admission: RE | Admit: 2017-04-26 | Discharge: 2017-04-26 | Disposition: A | Discharge status: Home / Self Care | Payer: Medicare Other | Source: Ambulatory Visit | Attending: Nephrology | Admitting: Nephrology

## 2017-04-26 VITALS — BP 144/61 | HR 72 | Temp 98.6°F | Resp 18

## 2017-04-26 DIAGNOSIS — N2581 Secondary hyperparathyroidism of renal origin: Secondary | ICD-10-CM | POA: Diagnosis not present

## 2017-04-26 DIAGNOSIS — N189 Chronic kidney disease, unspecified: Secondary | ICD-10-CM

## 2017-04-26 DIAGNOSIS — N183 Chronic kidney disease, stage 3 (moderate): Secondary | ICD-10-CM | POA: Diagnosis not present

## 2017-04-26 DIAGNOSIS — Z940 Kidney transplant status: Secondary | ICD-10-CM | POA: Diagnosis not present

## 2017-04-26 DIAGNOSIS — D631 Anemia in chronic kidney disease: Secondary | ICD-10-CM

## 2017-04-26 LAB — CBC WITH DIFFERENTIAL/PLATELET
BASOS ABS: 0 10*3/uL (ref 0.0–0.1)
Basophils Relative: 0 %
EOS ABS: 0.2 10*3/uL (ref 0.0–0.7)
EOS PCT: 3 %
HCT: 28.5 % — ABNORMAL LOW (ref 39.0–52.0)
Hemoglobin: 9.3 g/dL — ABNORMAL LOW (ref 13.0–17.0)
Lymphocytes Relative: 33 %
Lymphs Abs: 2.6 10*3/uL (ref 0.7–4.0)
MCH: 28.5 pg (ref 26.0–34.0)
MCHC: 32.6 g/dL (ref 30.0–36.0)
MCV: 87.4 fL (ref 78.0–100.0)
MONO ABS: 0.3 10*3/uL (ref 0.1–1.0)
Monocytes Relative: 4 %
Neutro Abs: 4.7 10*3/uL (ref 1.7–7.7)
Neutrophils Relative %: 60 %
PLATELETS: 216 10*3/uL (ref 150–400)
RBC: 3.26 MIL/uL — ABNORMAL LOW (ref 4.22–5.81)
RDW: 13.7 % (ref 11.5–15.5)
WBC: 7.8 10*3/uL (ref 4.0–10.5)

## 2017-04-26 LAB — COMPREHENSIVE METABOLIC PANEL
ALBUMIN: 2.8 g/dL — AB (ref 3.5–5.0)
ALT: 18 U/L (ref 17–63)
AST: 26 U/L (ref 15–41)
Alkaline Phosphatase: 70 U/L (ref 38–126)
Anion gap: 6 (ref 5–15)
BUN: 28 mg/dL — ABNORMAL HIGH (ref 6–20)
CALCIUM: 8.3 mg/dL — AB (ref 8.9–10.3)
CHLORIDE: 110 mmol/L (ref 101–111)
CO2: 21 mmol/L — AB (ref 22–32)
CREATININE: 2.95 mg/dL — AB (ref 0.61–1.24)
GFR calc non Af Amer: 20 mL/min — ABNORMAL LOW (ref >60)
GFR, EST AFRICAN AMERICAN: 23 mL/min — AB (ref >60)
GLUCOSE: 89 mg/dL (ref 65–99)
Potassium: 4.5 mmol/L (ref 3.5–5.1)
SODIUM: 137 mmol/L (ref 135–145)
Total Bilirubin: 0.6 mg/dL (ref 0.3–1.2)
Total Protein: 8.1 g/dL (ref 6.5–8.1)

## 2017-04-26 LAB — LIPID PANEL
Cholesterol: 121 mg/dL (ref 0–200)
HDL: 42 mg/dL (ref >40)
LDL CALC: 38 mg/dL (ref 0–99)
Total CHOL/HDL Ratio: 2.9 RATIO
Triglycerides: 204 mg/dL — ABNORMAL HIGH (ref <150)
VLDL: 41 mg/dL — AB (ref 0–40)

## 2017-04-26 LAB — RENAL FUNCTION PANEL
ALBUMIN: 2.7 g/dL — AB (ref 3.5–5.0)
Anion gap: 4 — ABNORMAL LOW (ref 5–15)
BUN: 28 mg/dL — AB (ref 6–20)
CALCIUM: 8.3 mg/dL — AB (ref 8.9–10.3)
CO2: 22 mmol/L (ref 22–32)
CREATININE: 2.88 mg/dL — AB (ref 0.61–1.24)
Chloride: 111 mmol/L (ref 101–111)
GFR calc Af Amer: 24 mL/min — ABNORMAL LOW (ref >60)
GFR calc non Af Amer: 21 mL/min — ABNORMAL LOW (ref >60)
GLUCOSE: 86 mg/dL (ref 65–99)
PHOSPHORUS: 2.5 mg/dL (ref 2.5–4.6)
Potassium: 4.5 mmol/L (ref 3.5–5.1)
SODIUM: 137 mmol/L (ref 135–145)

## 2017-04-26 LAB — POCT HEMOGLOBIN-HEMACUE: Hemoglobin: 9.6 g/dL — ABNORMAL LOW (ref 13.0–17.0)

## 2017-04-26 LAB — URIC ACID: URIC ACID, SERUM: 7.9 mg/dL — AB (ref 4.4–7.6)

## 2017-04-26 LAB — PHOSPHORUS: PHOSPHORUS: 2.5 mg/dL (ref 2.5–4.6)

## 2017-04-26 MED ORDER — DARBEPOETIN ALFA 100 MCG/0.5ML IJ SOSY
PREFILLED_SYRINGE | INTRAMUSCULAR | Status: AC
Start: 2017-04-26 — End: 2017-04-26
  Administered 2017-04-26: 100 ug
  Filled 2017-04-26: qty 0.5

## 2017-04-26 MED ORDER — DARBEPOETIN ALFA 200 MCG/0.4ML IJ SOSY
PREFILLED_SYRINGE | INTRAMUSCULAR | Status: AC
  Filled 2017-04-26: qty 0.4

## 2017-04-26 MED ORDER — DARBEPOETIN ALFA 300 MCG/0.6ML IJ SOSY
300.0000 ug | PREFILLED_SYRINGE | INTRAMUSCULAR | Status: DC
  Administered 2017-04-26: 200 ug via SUBCUTANEOUS
  Filled 2017-04-26: qty 0.6

## 2017-04-28 LAB — TACROLIMUS LEVEL: Tacrolimus (FK506) - LabCorp: 2.4 ng/mL (ref 2.0–20.0)

## 2017-05-24 ENCOUNTER — Encounter (HOSPITAL_COMMUNITY): Payer: Self-pay

## 2017-05-27 ENCOUNTER — Encounter (HOSPITAL_COMMUNITY)
Admission: RE | Admit: 2017-05-27 | Discharge: 2017-05-27 | Disposition: A | Discharge status: Home / Self Care | Payer: Medicare Other | Source: Ambulatory Visit | Attending: Nephrology | Admitting: Nephrology

## 2017-05-27 VITALS — BP 132/60 | HR 72 | Resp 18

## 2017-05-27 DIAGNOSIS — D631 Anemia in chronic kidney disease: Secondary | ICD-10-CM | POA: Insufficient documentation

## 2017-05-27 DIAGNOSIS — N189 Chronic kidney disease, unspecified: Secondary | ICD-10-CM

## 2017-05-27 DIAGNOSIS — N2581 Secondary hyperparathyroidism of renal origin: Secondary | ICD-10-CM | POA: Insufficient documentation

## 2017-05-27 DIAGNOSIS — Z940 Kidney transplant status: Secondary | ICD-10-CM | POA: Diagnosis not present

## 2017-05-27 DIAGNOSIS — N183 Chronic kidney disease, stage 3 (moderate): Secondary | ICD-10-CM | POA: Insufficient documentation

## 2017-05-27 LAB — POCT HEMOGLOBIN-HEMACUE: HEMOGLOBIN: 8.9 g/dL — AB (ref 13.0–17.0)

## 2017-05-27 LAB — IRON AND TIBC
Iron: 102 ug/dL (ref 45–182)
SATURATION RATIOS: 67 % — AB (ref 17.9–39.5)
TIBC: 151 ug/dL — ABNORMAL LOW (ref 250–450)
UIBC: 49 ug/dL

## 2017-05-27 LAB — FERRITIN: FERRITIN: 551 ng/mL — AB (ref 24–336)

## 2017-05-27 MED ORDER — DARBEPOETIN ALFA 150 MCG/0.3ML IJ SOSY
PREFILLED_SYRINGE | INTRAMUSCULAR | Status: AC
  Administered 2017-05-27: 300 ug
  Filled 2017-05-27: qty 0.6

## 2017-05-27 MED ORDER — DARBEPOETIN ALFA 100 MCG/0.5ML IJ SOSY: 300.0000 ug | PREFILLED_SYRINGE | INTRAMUSCULAR | Status: DC

## 2017-06-28 ENCOUNTER — Encounter (HOSPITAL_COMMUNITY)
Admission: RE | Admit: 2017-06-28 | Discharge: 2017-06-28 | Disposition: A | Discharge status: Home / Self Care | Payer: Medicare Other | Source: Ambulatory Visit | Attending: Nephrology | Admitting: Nephrology

## 2017-06-28 VITALS — BP 163/70 | HR 77 | Temp 97.7°F

## 2017-06-28 DIAGNOSIS — N2581 Secondary hyperparathyroidism of renal origin: Secondary | ICD-10-CM | POA: Insufficient documentation

## 2017-06-28 DIAGNOSIS — N183 Chronic kidney disease, stage 3 (moderate): Secondary | ICD-10-CM | POA: Insufficient documentation

## 2017-06-28 DIAGNOSIS — Z940 Kidney transplant status: Secondary | ICD-10-CM | POA: Insufficient documentation

## 2017-06-28 DIAGNOSIS — D631 Anemia in chronic kidney disease: Secondary | ICD-10-CM | POA: Diagnosis not present

## 2017-06-28 DIAGNOSIS — N189 Chronic kidney disease, unspecified: Secondary | ICD-10-CM

## 2017-06-28 LAB — RENAL FUNCTION PANEL
Albumin: 2.9 g/dL — ABNORMAL LOW (ref 3.5–5.0)
Anion gap: 6 (ref 5–15)
BUN: 48 mg/dL — AB (ref 6–20)
CALCIUM: 8.7 mg/dL — AB (ref 8.9–10.3)
CO2: 18 mmol/L — AB (ref 22–32)
CREATININE: 3.43 mg/dL — AB (ref 0.61–1.24)
Chloride: 109 mmol/L (ref 101–111)
GFR calc non Af Amer: 17 mL/min — ABNORMAL LOW (ref >60)
GFR, EST AFRICAN AMERICAN: 20 mL/min — AB (ref >60)
Glucose, Bld: 98 mg/dL (ref 65–99)
Phosphorus: 2.7 mg/dL (ref 2.5–4.6)
Potassium: 4.7 mmol/L (ref 3.5–5.1)
SODIUM: 133 mmol/L — AB (ref 135–145)

## 2017-06-28 LAB — POCT HEMOGLOBIN-HEMACUE: Hemoglobin: 8.6 g/dL — ABNORMAL LOW (ref 13.0–17.0)

## 2017-06-28 MED ORDER — DARBEPOETIN ALFA 100 MCG/0.5ML IJ SOSY
PREFILLED_SYRINGE | INTRAMUSCULAR | Status: AC
  Administered 2017-06-28: 100 ug
  Filled 2017-06-28: qty 0.5

## 2017-06-28 MED ORDER — DARBEPOETIN ALFA 200 MCG/0.4ML IJ SOSY
PREFILLED_SYRINGE | INTRAMUSCULAR | Status: AC
  Filled 2017-06-28: qty 0.4

## 2017-06-28 MED ORDER — DARBEPOETIN ALFA 300 MCG/0.6ML IJ SOSY
300.0000 ug | PREFILLED_SYRINGE | INTRAMUSCULAR | Status: DC
  Administered 2017-06-28: 200 ug via SUBCUTANEOUS
  Filled 2017-06-28: qty 0.6

## 2017-06-29 LAB — PTH, INTACT AND CALCIUM
CALCIUM TOTAL (PTH): 8.6 mg/dL (ref 8.6–10.2)
PTH: 71 pg/mL — AB (ref 15–65)

## 2017-07-18 ENCOUNTER — Other Ambulatory Visit (HOSPITAL_COMMUNITY): Payer: Self-pay

## 2017-07-19 ENCOUNTER — Encounter (HOSPITAL_COMMUNITY)
Admission: RE | Admit: 2017-07-19 | Discharge: 2017-07-19 | Disposition: A | Discharge status: Home / Self Care | Payer: Medicare Other | Source: Ambulatory Visit | Attending: Nephrology | Admitting: Nephrology

## 2017-07-19 VITALS — BP 147/74 | HR 73 | Temp 98.2°F | Resp 18

## 2017-07-19 DIAGNOSIS — N2581 Secondary hyperparathyroidism of renal origin: Secondary | ICD-10-CM | POA: Diagnosis not present

## 2017-07-19 DIAGNOSIS — N183 Chronic kidney disease, stage 3 (moderate): Secondary | ICD-10-CM | POA: Diagnosis not present

## 2017-07-19 DIAGNOSIS — N189 Chronic kidney disease, unspecified: Secondary | ICD-10-CM

## 2017-07-19 DIAGNOSIS — Z940 Kidney transplant status: Secondary | ICD-10-CM | POA: Insufficient documentation

## 2017-07-19 DIAGNOSIS — D631 Anemia in chronic kidney disease: Secondary | ICD-10-CM | POA: Diagnosis not present

## 2017-07-19 LAB — POCT HEMOGLOBIN-HEMACUE: Hemoglobin: 9.7 g/dL — ABNORMAL LOW (ref 13.0–17.0)

## 2017-07-19 MED ORDER — DARBEPOETIN ALFA 300 MCG/0.6ML IJ SOSY
300.0000 ug | PREFILLED_SYRINGE | INTRAMUSCULAR | Status: DC
  Administered 2017-07-19: 300 ug via SUBCUTANEOUS
  Filled 2017-07-19: qty 0.6

## 2017-07-19 MED ORDER — DARBEPOETIN ALFA 150 MCG/0.3ML IJ SOSY
PREFILLED_SYRINGE | INTRAMUSCULAR | Status: AC
  Administered 2017-07-19: 300 ug via SUBCUTANEOUS
  Filled 2017-07-19: qty 0.6

## 2017-07-26 ENCOUNTER — Encounter (HOSPITAL_COMMUNITY): Payer: Self-pay

## 2017-08-08 ENCOUNTER — Other Ambulatory Visit (HOSPITAL_COMMUNITY): Payer: Self-pay | Admitting: *Deleted

## 2017-08-09 ENCOUNTER — Ambulatory Visit (HOSPITAL_COMMUNITY)
Admission: RE | Admit: 2017-08-09 | Discharge: 2017-08-09 | Disposition: A | Discharge status: Home / Self Care | Payer: Medicare Other | Source: Ambulatory Visit | Attending: Nephrology | Admitting: Nephrology

## 2017-08-09 DIAGNOSIS — N2581 Secondary hyperparathyroidism of renal origin: Secondary | ICD-10-CM | POA: Insufficient documentation

## 2017-08-09 DIAGNOSIS — N189 Chronic kidney disease, unspecified: Secondary | ICD-10-CM | POA: Diagnosis not present

## 2017-08-09 DIAGNOSIS — D631 Anemia in chronic kidney disease: Secondary | ICD-10-CM

## 2017-08-09 DIAGNOSIS — Z940 Kidney transplant status: Secondary | ICD-10-CM | POA: Diagnosis not present

## 2017-08-09 LAB — POCT HEMOGLOBIN-HEMACUE: HEMOGLOBIN: 10.6 g/dL — AB (ref 13.0–17.0)

## 2017-08-09 MED ORDER — DARBEPOETIN ALFA 200 MCG/0.4ML IJ SOSY
PREFILLED_SYRINGE | INTRAMUSCULAR | Status: AC
  Administered 2017-08-09: 200 ug via SUBCUTANEOUS
  Filled 2017-08-09: qty 0.4

## 2017-08-09 MED ORDER — DARBEPOETIN ALFA 300 MCG/0.6ML IJ SOSY
300.0000 ug | PREFILLED_SYRINGE | INTRAMUSCULAR | Status: DC
Start: 2017-08-09 — End: 2017-08-09

## 2017-08-09 MED ORDER — DARBEPOETIN ALFA 100 MCG/0.5ML IJ SOSY
PREFILLED_SYRINGE | INTRAMUSCULAR | Status: AC
  Administered 2017-08-09: 100 ug via SUBCUTANEOUS
  Filled 2017-08-09: qty 0.5

## 2017-08-14 DIAGNOSIS — N189 Chronic kidney disease, unspecified: Secondary | ICD-10-CM | POA: Diagnosis not present

## 2017-08-14 DIAGNOSIS — D899 Disorder involving the immune mechanism, unspecified: Secondary | ICD-10-CM | POA: Diagnosis not present

## 2017-08-14 DIAGNOSIS — E872 Acidosis: Secondary | ICD-10-CM | POA: Diagnosis not present

## 2017-08-14 DIAGNOSIS — D631 Anemia in chronic kidney disease: Secondary | ICD-10-CM | POA: Diagnosis not present

## 2017-08-14 DIAGNOSIS — I129 Hypertensive chronic kidney disease with stage 1 through stage 4 chronic kidney disease, or unspecified chronic kidney disease: Secondary | ICD-10-CM | POA: Diagnosis not present

## 2017-08-14 DIAGNOSIS — N529 Male erectile dysfunction, unspecified: Secondary | ICD-10-CM | POA: Diagnosis not present

## 2017-08-14 DIAGNOSIS — N2581 Secondary hyperparathyroidism of renal origin: Secondary | ICD-10-CM | POA: Diagnosis not present

## 2017-08-14 DIAGNOSIS — Z940 Kidney transplant status: Secondary | ICD-10-CM | POA: Diagnosis not present

## 2017-08-14 DIAGNOSIS — M109 Gout, unspecified: Secondary | ICD-10-CM | POA: Diagnosis not present

## 2017-08-23 ENCOUNTER — Encounter (HOSPITAL_COMMUNITY): Payer: Self-pay

## 2017-08-30 ENCOUNTER — Encounter (HOSPITAL_COMMUNITY)
Admission: RE | Admit: 2017-08-30 | Discharge: 2017-08-30 | Disposition: A | Discharge status: Home / Self Care | Payer: Medicare Other | Source: Ambulatory Visit | Attending: Nephrology | Admitting: Nephrology

## 2017-08-30 VITALS — BP 161/76

## 2017-08-30 DIAGNOSIS — N183 Chronic kidney disease, stage 3 (moderate): Secondary | ICD-10-CM | POA: Diagnosis not present

## 2017-08-30 DIAGNOSIS — N2581 Secondary hyperparathyroidism of renal origin: Secondary | ICD-10-CM | POA: Insufficient documentation

## 2017-08-30 DIAGNOSIS — D631 Anemia in chronic kidney disease: Secondary | ICD-10-CM | POA: Insufficient documentation

## 2017-08-30 DIAGNOSIS — N189 Chronic kidney disease, unspecified: Secondary | ICD-10-CM

## 2017-08-30 DIAGNOSIS — Z940 Kidney transplant status: Secondary | ICD-10-CM | POA: Diagnosis not present

## 2017-08-30 LAB — IRON AND TIBC
IRON: 79 ug/dL (ref 45–182)
SATURATION RATIOS: 51 % — AB (ref 17.9–39.5)
TIBC: 154 ug/dL — ABNORMAL LOW (ref 250–450)
UIBC: 75 ug/dL

## 2017-08-30 LAB — RENAL FUNCTION PANEL
ALBUMIN: 2.8 g/dL — AB (ref 3.5–5.0)
Anion gap: 5 (ref 5–15)
BUN: 40 mg/dL — AB (ref 8–23)
CO2: 19 mmol/L — ABNORMAL LOW (ref 22–32)
CREATININE: 2.91 mg/dL — AB (ref 0.61–1.24)
Calcium: 8.3 mg/dL — ABNORMAL LOW (ref 8.9–10.3)
Chloride: 113 mmol/L — ABNORMAL HIGH (ref 98–111)
GFR, EST AFRICAN AMERICAN: 24 mL/min — AB (ref >60)
GFR, EST NON AFRICAN AMERICAN: 21 mL/min — AB (ref >60)
Glucose, Bld: 117 mg/dL — ABNORMAL HIGH (ref 70–99)
POTASSIUM: 4.8 mmol/L (ref 3.5–5.1)
Phosphorus: 3.2 mg/dL (ref 2.5–4.6)
Sodium: 137 mmol/L (ref 135–145)

## 2017-08-30 LAB — FERRITIN: FERRITIN: 478 ng/mL — AB (ref 24–336)

## 2017-08-30 LAB — POCT HEMOGLOBIN-HEMACUE: HEMOGLOBIN: 10.3 g/dL — AB (ref 13.0–17.0)

## 2017-08-30 MED ORDER — DARBEPOETIN ALFA 150 MCG/0.3ML IJ SOSY
PREFILLED_SYRINGE | INTRAMUSCULAR | Status: AC
  Administered 2017-08-30: 300 ug
  Filled 2017-08-30: qty 0.6

## 2017-08-30 MED ORDER — DARBEPOETIN ALFA 300 MCG/0.6ML IJ SOSY
300.0000 ug | PREFILLED_SYRINGE | INTRAMUSCULAR | Status: DC
  Filled 2017-08-30: qty 0.6

## 2017-08-31 LAB — PTH, INTACT AND CALCIUM
Calcium, Total (PTH): 8.1 mg/dL — ABNORMAL LOW (ref 8.6–10.2)
PTH: 62 pg/mL (ref 15–65)

## 2017-09-19 ENCOUNTER — Other Ambulatory Visit (HOSPITAL_COMMUNITY): Payer: Self-pay | Admitting: *Deleted

## 2017-09-20 ENCOUNTER — Encounter (HOSPITAL_COMMUNITY)
Admission: RE | Admit: 2017-09-20 | Discharge: 2017-09-20 | Disposition: A | Discharge status: Home / Self Care | Payer: Medicare Other | Source: Ambulatory Visit | Attending: Nephrology | Admitting: Nephrology

## 2017-09-20 VITALS — BP 174/73 | HR 82 | Temp 98.4°F

## 2017-09-20 DIAGNOSIS — Z940 Kidney transplant status: Secondary | ICD-10-CM | POA: Insufficient documentation

## 2017-09-20 DIAGNOSIS — N189 Chronic kidney disease, unspecified: Secondary | ICD-10-CM

## 2017-09-20 DIAGNOSIS — N183 Chronic kidney disease, stage 3 (moderate): Secondary | ICD-10-CM | POA: Diagnosis not present

## 2017-09-20 DIAGNOSIS — D631 Anemia in chronic kidney disease: Secondary | ICD-10-CM | POA: Diagnosis not present

## 2017-09-20 DIAGNOSIS — N2581 Secondary hyperparathyroidism of renal origin: Secondary | ICD-10-CM | POA: Diagnosis not present

## 2017-09-20 LAB — POCT HEMOGLOBIN-HEMACUE: HEMOGLOBIN: 12 g/dL — AB (ref 13.0–17.0)

## 2017-09-20 MED ORDER — DARBEPOETIN ALFA 300 MCG/0.6ML IJ SOSY
300.0000 ug | PREFILLED_SYRINGE | INTRAMUSCULAR | Status: DC
  Filled 2017-09-20: qty 0.6

## 2017-09-22 LAB — TACROLIMUS LEVEL: Tacrolimus (FK506) - LabCorp: 6.9 ng/mL (ref 2.0–20.0)

## 2017-10-04 ENCOUNTER — Ambulatory Visit (HOSPITAL_COMMUNITY)
Admission: RE | Admit: 2017-10-04 | Discharge: 2017-10-04 | Disposition: A | Discharge status: Home / Self Care | Payer: Medicare Other | Source: Ambulatory Visit | Attending: Nephrology | Admitting: Nephrology

## 2017-10-04 VITALS — BP 150/74 | HR 75 | Temp 98.4°F

## 2017-10-04 DIAGNOSIS — N189 Chronic kidney disease, unspecified: Secondary | ICD-10-CM | POA: Insufficient documentation

## 2017-10-04 DIAGNOSIS — Z940 Kidney transplant status: Secondary | ICD-10-CM | POA: Insufficient documentation

## 2017-10-04 DIAGNOSIS — D631 Anemia in chronic kidney disease: Secondary | ICD-10-CM | POA: Insufficient documentation

## 2017-10-04 LAB — POCT HEMOGLOBIN-HEMACUE: Hemoglobin: 10.1 g/dL — ABNORMAL LOW (ref 13.0–17.0)

## 2017-10-04 MED ORDER — DARBEPOETIN ALFA 150 MCG/0.3ML IJ SOSY
PREFILLED_SYRINGE | INTRAMUSCULAR | Status: AC
  Filled 2017-10-04: qty 0.6

## 2017-10-04 MED ORDER — DARBEPOETIN ALFA 300 MCG/0.6ML IJ SOSY
300.0000 ug | PREFILLED_SYRINGE | INTRAMUSCULAR | Status: DC
  Administered 2017-10-04: 300 ug via SUBCUTANEOUS
  Filled 2017-10-04: qty 0.6

## 2017-10-11 ENCOUNTER — Encounter (HOSPITAL_COMMUNITY): Payer: Self-pay

## 2017-10-25 ENCOUNTER — Ambulatory Visit (HOSPITAL_COMMUNITY)
Admission: RE | Admit: 2017-10-25 | Discharge: 2017-10-25 | Disposition: A | Discharge status: Home / Self Care | Payer: Medicare Other | Source: Ambulatory Visit | Attending: Nephrology | Admitting: Nephrology

## 2017-10-25 VITALS — BP 164/70 | HR 81 | Resp 18

## 2017-10-25 DIAGNOSIS — N189 Chronic kidney disease, unspecified: Secondary | ICD-10-CM | POA: Insufficient documentation

## 2017-10-25 DIAGNOSIS — D631 Anemia in chronic kidney disease: Secondary | ICD-10-CM | POA: Diagnosis not present

## 2017-10-25 LAB — POCT HEMOGLOBIN-HEMACUE: Hemoglobin: 9.6 g/dL — ABNORMAL LOW (ref 13.0–17.0)

## 2017-10-25 MED ORDER — DARBEPOETIN ALFA 150 MCG/0.3ML IJ SOSY
PREFILLED_SYRINGE | INTRAMUSCULAR | Status: AC
  Administered 2017-10-25: 300 ug via SUBCUTANEOUS
  Filled 2017-10-25: qty 0.6

## 2017-10-25 MED ORDER — DARBEPOETIN ALFA 300 MCG/0.6ML IJ SOSY
300.0000 ug | PREFILLED_SYRINGE | INTRAMUSCULAR | Status: DC
  Administered 2017-10-25: 300 ug via SUBCUTANEOUS
  Filled 2017-10-25: qty 0.6

## 2017-11-15 ENCOUNTER — Encounter (HOSPITAL_COMMUNITY)
Admission: RE | Admit: 2017-11-15 | Discharge: 2017-11-15 | Disposition: A | Discharge status: Home / Self Care | Payer: Medicare Other | Source: Ambulatory Visit | Attending: Nephrology | Admitting: Nephrology

## 2017-11-15 VITALS — BP 159/81 | HR 71 | Temp 97.9°F | Resp 20

## 2017-11-15 DIAGNOSIS — D631 Anemia in chronic kidney disease: Secondary | ICD-10-CM | POA: Insufficient documentation

## 2017-11-15 DIAGNOSIS — N189 Chronic kidney disease, unspecified: Secondary | ICD-10-CM

## 2017-11-15 DIAGNOSIS — Z940 Kidney transplant status: Secondary | ICD-10-CM | POA: Insufficient documentation

## 2017-11-15 DIAGNOSIS — N183 Chronic kidney disease, stage 3 (moderate): Secondary | ICD-10-CM | POA: Diagnosis not present

## 2017-11-15 DIAGNOSIS — N2581 Secondary hyperparathyroidism of renal origin: Secondary | ICD-10-CM | POA: Insufficient documentation

## 2017-11-15 LAB — POCT HEMOGLOBIN-HEMACUE: Hemoglobin: 10.9 g/dL — ABNORMAL LOW (ref 13.0–17.0)

## 2017-11-15 MED ORDER — DARBEPOETIN ALFA 100 MCG/0.5ML IJ SOSY
PREFILLED_SYRINGE | INTRAMUSCULAR | Status: AC
  Administered 2017-11-15: 100 ug via SUBCUTANEOUS
  Filled 2017-11-15: qty 0.5

## 2017-11-15 MED ORDER — DARBEPOETIN ALFA 300 MCG/0.6ML IJ SOSY
300.0000 ug | PREFILLED_SYRINGE | INTRAMUSCULAR | Status: DC
  Filled 2017-11-15: qty 0.6

## 2017-11-15 MED ORDER — DARBEPOETIN ALFA 200 MCG/0.4ML IJ SOSY
PREFILLED_SYRINGE | INTRAMUSCULAR | Status: AC
  Administered 2017-11-15: 200 ug via SUBCUTANEOUS
  Filled 2017-11-15: qty 0.4

## 2017-12-06 ENCOUNTER — Ambulatory Visit (HOSPITAL_COMMUNITY)
Admission: RE | Admit: 2017-12-06 | Discharge: 2017-12-06 | Disposition: A | Discharge status: Home / Self Care | Payer: Medicare Other | Source: Ambulatory Visit | Attending: Nephrology | Admitting: Nephrology

## 2017-12-06 VITALS — BP 151/67 | HR 77 | Temp 98.1°F | Resp 20

## 2017-12-06 DIAGNOSIS — N189 Chronic kidney disease, unspecified: Secondary | ICD-10-CM | POA: Diagnosis not present

## 2017-12-06 DIAGNOSIS — Z940 Kidney transplant status: Secondary | ICD-10-CM | POA: Diagnosis not present

## 2017-12-06 DIAGNOSIS — D631 Anemia in chronic kidney disease: Secondary | ICD-10-CM

## 2017-12-06 LAB — RENAL FUNCTION PANEL
Albumin: 2.8 g/dL — ABNORMAL LOW (ref 3.5–5.0)
Anion gap: 4 — ABNORMAL LOW (ref 5–15)
BUN: 47 mg/dL — AB (ref 8–23)
CALCIUM: 8.4 mg/dL — AB (ref 8.9–10.3)
CHLORIDE: 116 mmol/L — AB (ref 98–111)
CO2: 15 mmol/L — AB (ref 22–32)
CREATININE: 3.24 mg/dL — AB (ref 0.61–1.24)
GFR calc Af Amer: 21 mL/min — ABNORMAL LOW (ref >60)
GFR calc non Af Amer: 18 mL/min — ABNORMAL LOW (ref >60)
GLUCOSE: 103 mg/dL — AB (ref 70–99)
Phosphorus: 2.9 mg/dL (ref 2.5–4.6)
Potassium: 4.8 mmol/L (ref 3.5–5.1)
SODIUM: 135 mmol/L (ref 135–145)

## 2017-12-06 LAB — IRON AND TIBC
IRON: 66 ug/dL (ref 45–182)
SATURATION RATIOS: 43 % — AB (ref 17.9–39.5)
TIBC: 154 ug/dL — AB (ref 250–450)
UIBC: 88 ug/dL

## 2017-12-06 LAB — POCT HEMOGLOBIN-HEMACUE: Hemoglobin: 10.9 g/dL — ABNORMAL LOW (ref 13.0–17.0)

## 2017-12-06 LAB — FERRITIN: Ferritin: 499 ng/mL — ABNORMAL HIGH (ref 24–336)

## 2017-12-06 MED ORDER — DARBEPOETIN ALFA 150 MCG/0.3ML IJ SOSY
PREFILLED_SYRINGE | INTRAMUSCULAR | Status: AC
  Administered 2017-12-06: 300 ug
  Filled 2017-12-06: qty 0.6

## 2017-12-06 MED ORDER — DARBEPOETIN ALFA 300 MCG/0.6ML IJ SOSY
300.0000 ug | PREFILLED_SYRINGE | INTRAMUSCULAR | Status: DC
  Filled 2017-12-06: qty 0.6

## 2017-12-07 LAB — PTH, INTACT AND CALCIUM
Calcium, Total (PTH): 8.6 mg/dL (ref 8.6–10.2)
PTH: 72 pg/mL — ABNORMAL HIGH (ref 15–65)

## 2017-12-10 DIAGNOSIS — N529 Male erectile dysfunction, unspecified: Secondary | ICD-10-CM | POA: Diagnosis not present

## 2017-12-10 DIAGNOSIS — I129 Hypertensive chronic kidney disease with stage 1 through stage 4 chronic kidney disease, or unspecified chronic kidney disease: Secondary | ICD-10-CM | POA: Diagnosis not present

## 2017-12-10 DIAGNOSIS — N189 Chronic kidney disease, unspecified: Secondary | ICD-10-CM | POA: Diagnosis not present

## 2017-12-10 DIAGNOSIS — N2581 Secondary hyperparathyroidism of renal origin: Secondary | ICD-10-CM | POA: Diagnosis not present

## 2017-12-10 DIAGNOSIS — M109 Gout, unspecified: Secondary | ICD-10-CM | POA: Diagnosis not present

## 2017-12-10 DIAGNOSIS — D899 Disorder involving the immune mechanism, unspecified: Secondary | ICD-10-CM | POA: Diagnosis not present

## 2017-12-10 DIAGNOSIS — E872 Acidosis: Secondary | ICD-10-CM | POA: Diagnosis not present

## 2017-12-10 DIAGNOSIS — Z940 Kidney transplant status: Secondary | ICD-10-CM | POA: Diagnosis not present

## 2017-12-10 DIAGNOSIS — D631 Anemia in chronic kidney disease: Secondary | ICD-10-CM | POA: Diagnosis not present

## 2017-12-27 ENCOUNTER — Encounter (HOSPITAL_COMMUNITY)
Admission: RE | Admit: 2017-12-27 | Discharge: 2017-12-27 | Disposition: A | Discharge status: Home / Self Care | Payer: Medicare Other | Source: Ambulatory Visit | Attending: Nephrology | Admitting: Nephrology

## 2017-12-27 VITALS — BP 154/73 | HR 78 | Temp 98.1°F | Resp 18

## 2017-12-27 DIAGNOSIS — N2581 Secondary hyperparathyroidism of renal origin: Secondary | ICD-10-CM | POA: Insufficient documentation

## 2017-12-27 DIAGNOSIS — Z940 Kidney transplant status: Secondary | ICD-10-CM | POA: Insufficient documentation

## 2017-12-27 DIAGNOSIS — N189 Chronic kidney disease, unspecified: Secondary | ICD-10-CM

## 2017-12-27 DIAGNOSIS — D631 Anemia in chronic kidney disease: Secondary | ICD-10-CM

## 2017-12-27 DIAGNOSIS — N183 Chronic kidney disease, stage 3 (moderate): Secondary | ICD-10-CM | POA: Diagnosis not present

## 2017-12-27 LAB — POCT HEMOGLOBIN-HEMACUE: Hemoglobin: 10.9 g/dL — ABNORMAL LOW (ref 13.0–17.0)

## 2017-12-27 MED ORDER — DARBEPOETIN ALFA 300 MCG/0.6ML IJ SOSY
PREFILLED_SYRINGE | INTRAMUSCULAR | Status: AC
  Filled 2017-12-27: qty 0.6

## 2017-12-27 MED ORDER — DARBEPOETIN ALFA 300 MCG/0.6ML IJ SOSY
300.0000 ug | PREFILLED_SYRINGE | INTRAMUSCULAR | Status: DC
  Administered 2017-12-27: 300 ug via SUBCUTANEOUS

## 2018-01-17 ENCOUNTER — Ambulatory Visit (HOSPITAL_COMMUNITY)
Admission: RE | Admit: 2018-01-17 | Discharge: 2018-01-17 | Disposition: A | Discharge status: Home / Self Care | Payer: Medicare Other | Source: Ambulatory Visit | Attending: Nephrology | Admitting: Nephrology

## 2018-01-17 VITALS — BP 151/71 | HR 81 | Resp 18

## 2018-01-17 DIAGNOSIS — N189 Chronic kidney disease, unspecified: Secondary | ICD-10-CM | POA: Diagnosis not present

## 2018-01-17 DIAGNOSIS — D631 Anemia in chronic kidney disease: Secondary | ICD-10-CM | POA: Diagnosis not present

## 2018-01-17 LAB — POCT HEMOGLOBIN-HEMACUE: Hemoglobin: 11.2 g/dL — ABNORMAL LOW (ref 13.0–17.0)

## 2018-01-17 MED ORDER — DARBEPOETIN ALFA 300 MCG/0.6ML IJ SOSY
PREFILLED_SYRINGE | INTRAMUSCULAR | Status: AC
  Administered 2018-01-17: 300 ug via SUBCUTANEOUS
  Filled 2018-01-17: qty 0.6

## 2018-01-17 MED ORDER — DARBEPOETIN ALFA 300 MCG/0.6ML IJ SOSY
300.0000 ug | PREFILLED_SYRINGE | INTRAMUSCULAR | Status: DC
  Administered 2018-01-17: 300 ug via SUBCUTANEOUS

## 2018-02-07 ENCOUNTER — Encounter (HOSPITAL_COMMUNITY)
Admission: RE | Admit: 2018-02-07 | Discharge: 2018-02-07 | Disposition: A | Discharge status: Home / Self Care | Payer: Medicare Other | Source: Ambulatory Visit | Attending: Nephrology | Admitting: Nephrology

## 2018-02-07 VITALS — BP 148/80 | HR 78 | Temp 97.5°F | Resp 20

## 2018-02-07 DIAGNOSIS — Z940 Kidney transplant status: Secondary | ICD-10-CM | POA: Insufficient documentation

## 2018-02-07 DIAGNOSIS — N2581 Secondary hyperparathyroidism of renal origin: Secondary | ICD-10-CM | POA: Diagnosis not present

## 2018-02-07 DIAGNOSIS — N183 Chronic kidney disease, stage 3 (moderate): Secondary | ICD-10-CM | POA: Insufficient documentation

## 2018-02-07 DIAGNOSIS — N189 Chronic kidney disease, unspecified: Secondary | ICD-10-CM

## 2018-02-07 DIAGNOSIS — D631 Anemia in chronic kidney disease: Secondary | ICD-10-CM | POA: Diagnosis not present

## 2018-02-07 MED ORDER — DARBEPOETIN ALFA 300 MCG/0.6ML IJ SOSY
300.0000 ug | PREFILLED_SYRINGE | INTRAMUSCULAR | Status: DC
  Administered 2018-02-07: 300 ug via SUBCUTANEOUS

## 2018-02-07 MED ORDER — DARBEPOETIN ALFA 300 MCG/0.6ML IJ SOSY
PREFILLED_SYRINGE | INTRAMUSCULAR | Status: AC
  Filled 2018-02-07: qty 0.6

## 2018-02-10 LAB — POCT HEMOGLOBIN-HEMACUE: HEMOGLOBIN: 11.2 g/dL — AB (ref 13.0–17.0)

## 2018-02-28 ENCOUNTER — Encounter (HOSPITAL_COMMUNITY)
Admission: RE | Admit: 2018-02-28 | Discharge: 2018-02-28 | Disposition: A | Discharge status: Home / Self Care | Payer: Medicare Other | Source: Ambulatory Visit | Attending: Nephrology | Admitting: Nephrology

## 2018-02-28 VITALS — BP 161/65 | HR 72 | Resp 20

## 2018-02-28 DIAGNOSIS — D631 Anemia in chronic kidney disease: Secondary | ICD-10-CM | POA: Diagnosis not present

## 2018-02-28 DIAGNOSIS — N189 Chronic kidney disease, unspecified: Principal | ICD-10-CM

## 2018-02-28 DIAGNOSIS — Z940 Kidney transplant status: Secondary | ICD-10-CM | POA: Diagnosis not present

## 2018-02-28 DIAGNOSIS — N2581 Secondary hyperparathyroidism of renal origin: Secondary | ICD-10-CM | POA: Diagnosis not present

## 2018-02-28 DIAGNOSIS — N183 Chronic kidney disease, stage 3 (moderate): Secondary | ICD-10-CM | POA: Diagnosis not present

## 2018-02-28 LAB — POCT HEMOGLOBIN-HEMACUE: Hemoglobin: 11.3 g/dL — ABNORMAL LOW (ref 13.0–17.0)

## 2018-02-28 MED ORDER — DARBEPOETIN ALFA 300 MCG/0.6ML IJ SOSY
PREFILLED_SYRINGE | INTRAMUSCULAR | Status: AC
  Administered 2018-02-28: 300 ug via SUBCUTANEOUS
  Filled 2018-02-28: qty 0.6

## 2018-02-28 MED ORDER — DARBEPOETIN ALFA 300 MCG/0.6ML IJ SOSY
300.0000 ug | PREFILLED_SYRINGE | INTRAMUSCULAR | Status: DC
  Administered 2018-02-28: 300 ug via SUBCUTANEOUS

## 2018-03-21 ENCOUNTER — Ambulatory Visit (HOSPITAL_COMMUNITY)
Admission: RE | Admit: 2018-03-21 | Discharge: 2018-03-21 | Disposition: A | Discharge status: Home / Self Care | Payer: Medicare Other | Source: Ambulatory Visit | Attending: Nephrology | Admitting: Nephrology

## 2018-03-21 VITALS — BP 163/78 | HR 74 | Temp 97.7°F | Resp 20

## 2018-03-21 DIAGNOSIS — N189 Chronic kidney disease, unspecified: Secondary | ICD-10-CM | POA: Insufficient documentation

## 2018-03-21 DIAGNOSIS — D631 Anemia in chronic kidney disease: Secondary | ICD-10-CM | POA: Diagnosis not present

## 2018-03-21 LAB — RENAL FUNCTION PANEL
ALBUMIN: 2.7 g/dL — AB (ref 3.5–5.0)
Anion gap: 5 (ref 5–15)
BUN: 34 mg/dL — AB (ref 8–23)
CO2: 20 mmol/L — ABNORMAL LOW (ref 22–32)
Calcium: 8.5 mg/dL — ABNORMAL LOW (ref 8.9–10.3)
Chloride: 112 mmol/L — ABNORMAL HIGH (ref 98–111)
Creatinine, Ser: 3.37 mg/dL — ABNORMAL HIGH (ref 0.61–1.24)
GFR calc Af Amer: 20 mL/min — ABNORMAL LOW (ref >60)
GFR calc non Af Amer: 17 mL/min — ABNORMAL LOW (ref >60)
Glucose, Bld: 75 mg/dL (ref 70–99)
PHOSPHORUS: 3.2 mg/dL (ref 2.5–4.6)
Potassium: 4.8 mmol/L (ref 3.5–5.1)
Sodium: 137 mmol/L (ref 135–145)

## 2018-03-21 LAB — IRON AND TIBC
Iron: 51 ug/dL (ref 45–182)
Saturation Ratios: 33 % (ref 17.9–39.5)
TIBC: 157 ug/dL — ABNORMAL LOW (ref 250–450)
UIBC: 106 ug/dL

## 2018-03-21 LAB — POCT HEMOGLOBIN-HEMACUE: Hemoglobin: 11.4 g/dL — ABNORMAL LOW (ref 13.0–17.0)

## 2018-03-21 LAB — FERRITIN: Ferritin: 558 ng/mL — ABNORMAL HIGH (ref 24–336)

## 2018-03-21 MED ORDER — DARBEPOETIN ALFA 300 MCG/0.6ML IJ SOSY
300.0000 ug | PREFILLED_SYRINGE | INTRAMUSCULAR | Status: DC
  Administered 2018-03-21: 300 ug via SUBCUTANEOUS

## 2018-03-21 MED ORDER — DARBEPOETIN ALFA 300 MCG/0.6ML IJ SOSY
PREFILLED_SYRINGE | INTRAMUSCULAR | Status: AC
  Filled 2018-03-21: qty 0.6

## 2018-03-22 LAB — PTH, INTACT AND CALCIUM
CALCIUM TOTAL (PTH): 8.4 mg/dL — AB (ref 8.6–10.2)
PTH: 42 pg/mL (ref 15–65)

## 2018-04-11 ENCOUNTER — Encounter (HOSPITAL_COMMUNITY)
Admission: RE | Admit: 2018-04-11 | Discharge: 2018-04-11 | Disposition: A | Discharge status: Home / Self Care | Payer: Medicare Other | Source: Ambulatory Visit | Attending: Nephrology | Admitting: Nephrology

## 2018-04-11 VITALS — BP 170/76

## 2018-04-11 DIAGNOSIS — N189 Chronic kidney disease, unspecified: Secondary | ICD-10-CM

## 2018-04-11 DIAGNOSIS — N2581 Secondary hyperparathyroidism of renal origin: Secondary | ICD-10-CM | POA: Diagnosis not present

## 2018-04-11 DIAGNOSIS — D631 Anemia in chronic kidney disease: Secondary | ICD-10-CM | POA: Insufficient documentation

## 2018-04-11 DIAGNOSIS — Z940 Kidney transplant status: Secondary | ICD-10-CM | POA: Insufficient documentation

## 2018-04-11 DIAGNOSIS — N183 Chronic kidney disease, stage 3 (moderate): Secondary | ICD-10-CM | POA: Diagnosis not present

## 2018-04-11 LAB — POCT HEMOGLOBIN-HEMACUE: Hemoglobin: 10 g/dL — ABNORMAL LOW (ref 13.0–17.0)

## 2018-04-11 MED ORDER — DARBEPOETIN ALFA 300 MCG/0.6ML IJ SOSY
PREFILLED_SYRINGE | INTRAMUSCULAR | Status: AC
  Administered 2018-04-11: 300 ug
  Filled 2018-04-11: qty 0.6

## 2018-04-11 MED ORDER — DARBEPOETIN ALFA 300 MCG/0.6ML IJ SOSY: 300.0000 ug | PREFILLED_SYRINGE | INTRAMUSCULAR | Status: DC

## 2018-04-15 DIAGNOSIS — N529 Male erectile dysfunction, unspecified: Secondary | ICD-10-CM | POA: Diagnosis not present

## 2018-04-15 DIAGNOSIS — D631 Anemia in chronic kidney disease: Secondary | ICD-10-CM | POA: Diagnosis not present

## 2018-04-15 DIAGNOSIS — N2581 Secondary hyperparathyroidism of renal origin: Secondary | ICD-10-CM | POA: Diagnosis not present

## 2018-04-15 DIAGNOSIS — E872 Acidosis: Secondary | ICD-10-CM | POA: Diagnosis not present

## 2018-04-15 DIAGNOSIS — D899 Disorder involving the immune mechanism, unspecified: Secondary | ICD-10-CM | POA: Diagnosis not present

## 2018-04-15 DIAGNOSIS — Z940 Kidney transplant status: Secondary | ICD-10-CM | POA: Diagnosis not present

## 2018-04-15 DIAGNOSIS — M109 Gout, unspecified: Secondary | ICD-10-CM | POA: Diagnosis not present

## 2018-04-15 DIAGNOSIS — N189 Chronic kidney disease, unspecified: Secondary | ICD-10-CM | POA: Diagnosis not present

## 2018-04-15 DIAGNOSIS — I129 Hypertensive chronic kidney disease with stage 1 through stage 4 chronic kidney disease, or unspecified chronic kidney disease: Secondary | ICD-10-CM | POA: Diagnosis not present

## 2018-05-01 ENCOUNTER — Other Ambulatory Visit (HOSPITAL_COMMUNITY): Payer: Self-pay

## 2018-05-02 ENCOUNTER — Ambulatory Visit (HOSPITAL_COMMUNITY)
Admission: RE | Admit: 2018-05-02 | Discharge: 2018-05-02 | Disposition: A | Discharge status: Home / Self Care | Payer: Medicare Other | Source: Ambulatory Visit | Attending: Nephrology | Admitting: Nephrology

## 2018-05-02 VITALS — BP 158/76 | HR 81 | Temp 98.1°F | Resp 20

## 2018-05-02 DIAGNOSIS — N189 Chronic kidney disease, unspecified: Secondary | ICD-10-CM | POA: Insufficient documentation

## 2018-05-02 DIAGNOSIS — D631 Anemia in chronic kidney disease: Secondary | ICD-10-CM | POA: Diagnosis not present

## 2018-05-02 LAB — POCT HEMOGLOBIN-HEMACUE: Hemoglobin: 10.3 g/dL — ABNORMAL LOW (ref 13.0–17.0)

## 2018-05-02 MED ORDER — DARBEPOETIN ALFA 300 MCG/0.6ML IJ SOSY
PREFILLED_SYRINGE | INTRAMUSCULAR | Status: AC
  Administered 2018-05-02: 300 ug
  Filled 2018-05-02: qty 0.6

## 2018-05-02 MED ORDER — DARBEPOETIN ALFA 300 MCG/0.6ML IJ SOSY: 300.0000 ug | PREFILLED_SYRINGE | INTRAMUSCULAR | Status: DC

## 2018-05-23 ENCOUNTER — Other Ambulatory Visit: Payer: Self-pay

## 2018-05-23 ENCOUNTER — Ambulatory Visit (HOSPITAL_COMMUNITY)
Admission: RE | Admit: 2018-05-23 | Discharge: 2018-05-23 | Disposition: A | Discharge status: Home / Self Care | Payer: Medicare Other | Source: Ambulatory Visit | Attending: Nephrology | Admitting: Nephrology

## 2018-05-23 VITALS — BP 166/71 | Temp 98.2°F

## 2018-05-23 DIAGNOSIS — D631 Anemia in chronic kidney disease: Secondary | ICD-10-CM | POA: Diagnosis not present

## 2018-05-23 DIAGNOSIS — N189 Chronic kidney disease, unspecified: Secondary | ICD-10-CM | POA: Diagnosis not present

## 2018-05-23 LAB — POCT HEMOGLOBIN-HEMACUE: Hemoglobin: 10.1 g/dL — ABNORMAL LOW (ref 13.0–17.0)

## 2018-05-23 MED ORDER — DARBEPOETIN ALFA 300 MCG/0.6ML IJ SOSY
PREFILLED_SYRINGE | INTRAMUSCULAR | Status: AC
  Filled 2018-05-23: qty 0.6

## 2018-05-23 MED ORDER — DARBEPOETIN ALFA 300 MCG/0.6ML IJ SOSY
300.0000 ug | PREFILLED_SYRINGE | INTRAMUSCULAR | Status: DC
  Administered 2018-05-23: 300 ug via SUBCUTANEOUS

## 2018-05-24 LAB — TACROLIMUS LEVEL: TACROLIMUS (FK506) - LABCORP: 6.4 ng/mL (ref 2.0–20.0)

## 2018-06-13 ENCOUNTER — Ambulatory Visit (HOSPITAL_COMMUNITY)
Admission: RE | Admit: 2018-06-13 | Discharge: 2018-06-13 | Disposition: A | Discharge status: Home / Self Care | Payer: Medicare Other | Source: Ambulatory Visit | Attending: Nephrology | Admitting: Nephrology

## 2018-06-13 ENCOUNTER — Other Ambulatory Visit: Payer: Self-pay

## 2018-06-13 VITALS — BP 164/72 | HR 76 | Temp 97.6°F | Resp 20

## 2018-06-13 DIAGNOSIS — N189 Chronic kidney disease, unspecified: Secondary | ICD-10-CM | POA: Insufficient documentation

## 2018-06-13 DIAGNOSIS — D631 Anemia in chronic kidney disease: Secondary | ICD-10-CM | POA: Diagnosis not present

## 2018-06-13 LAB — IRON AND TIBC
Iron: 126 ug/dL (ref 45–182)
Saturation Ratios: 76 % — ABNORMAL HIGH (ref 17.9–39.5)
TIBC: 165 ug/dL — ABNORMAL LOW (ref 250–450)
UIBC: 39 ug/dL

## 2018-06-13 LAB — RENAL FUNCTION PANEL
Albumin: 2.9 g/dL — ABNORMAL LOW (ref 3.5–5.0)
Anion gap: 7 (ref 5–15)
BUN: 38 mg/dL — ABNORMAL HIGH (ref 8–23)
CO2: 19 mmol/L — ABNORMAL LOW (ref 22–32)
Calcium: 9 mg/dL (ref 8.9–10.3)
Chloride: 110 mmol/L (ref 98–111)
Creatinine, Ser: 3.21 mg/dL — ABNORMAL HIGH (ref 0.61–1.24)
GFR calc Af Amer: 21 mL/min — ABNORMAL LOW (ref >60)
GFR calc non Af Amer: 19 mL/min — ABNORMAL LOW (ref >60)
Glucose, Bld: 98 mg/dL (ref 70–99)
Phosphorus: 3.1 mg/dL (ref 2.5–4.6)
Potassium: 4.4 mmol/L (ref 3.5–5.1)
Sodium: 136 mmol/L (ref 135–145)

## 2018-06-13 LAB — FERRITIN: Ferritin: 417 ng/mL — ABNORMAL HIGH (ref 24–336)

## 2018-06-13 LAB — POCT HEMOGLOBIN-HEMACUE: Hemoglobin: 11.3 g/dL — ABNORMAL LOW (ref 13.0–17.0)

## 2018-06-13 MED ORDER — DARBEPOETIN ALFA 300 MCG/0.6ML IJ SOSY
300.0000 ug | PREFILLED_SYRINGE | INTRAMUSCULAR | Status: DC
  Administered 2018-06-13: 300 ug via SUBCUTANEOUS

## 2018-06-13 MED ORDER — DARBEPOETIN ALFA 300 MCG/0.6ML IJ SOSY
PREFILLED_SYRINGE | INTRAMUSCULAR | Status: AC
  Filled 2018-06-13: qty 0.6

## 2018-06-14 LAB — PTH, INTACT AND CALCIUM
Calcium, Total (PTH): 8.9 mg/dL (ref 8.6–10.2)
PTH: 43 pg/mL (ref 15–65)

## 2018-07-03 ENCOUNTER — Other Ambulatory Visit: Payer: Self-pay

## 2018-07-04 ENCOUNTER — Ambulatory Visit (HOSPITAL_COMMUNITY)
Admission: RE | Admit: 2018-07-04 | Discharge: 2018-07-04 | Disposition: A | Discharge status: Home / Self Care | Payer: Medicare Other | Source: Ambulatory Visit | Attending: Nephrology | Admitting: Nephrology

## 2018-07-04 DIAGNOSIS — N189 Chronic kidney disease, unspecified: Secondary | ICD-10-CM | POA: Diagnosis not present

## 2018-07-04 DIAGNOSIS — D631 Anemia in chronic kidney disease: Secondary | ICD-10-CM | POA: Diagnosis not present

## 2018-07-04 LAB — POCT HEMOGLOBIN-HEMACUE: Hemoglobin: 10.7 g/dL — ABNORMAL LOW (ref 13.0–17.0)

## 2018-07-04 MED ORDER — DARBEPOETIN ALFA 300 MCG/0.6ML IJ SOSY
PREFILLED_SYRINGE | INTRAMUSCULAR | Status: AC
  Filled 2018-07-04: qty 0.6

## 2018-07-04 MED ORDER — DARBEPOETIN ALFA 300 MCG/0.6ML IJ SOSY
300.0000 ug | PREFILLED_SYRINGE | INTRAMUSCULAR | Status: DC
  Administered 2018-07-04: 300 ug via SUBCUTANEOUS

## 2018-07-25 ENCOUNTER — Ambulatory Visit (HOSPITAL_COMMUNITY)
Admission: RE | Admit: 2018-07-25 | Discharge: 2018-07-25 | Disposition: A | Discharge status: Home / Self Care | Payer: Medicare Other | Source: Ambulatory Visit | Attending: Nephrology | Admitting: Nephrology

## 2018-07-25 ENCOUNTER — Other Ambulatory Visit: Payer: Self-pay

## 2018-07-25 VITALS — BP 169/73 | HR 80 | Temp 98.8°F | Resp 18

## 2018-07-25 DIAGNOSIS — N189 Chronic kidney disease, unspecified: Secondary | ICD-10-CM | POA: Diagnosis not present

## 2018-07-25 DIAGNOSIS — D631 Anemia in chronic kidney disease: Secondary | ICD-10-CM | POA: Diagnosis not present

## 2018-07-25 LAB — POCT HEMOGLOBIN-HEMACUE: Hemoglobin: 11 g/dL — ABNORMAL LOW (ref 13.0–17.0)

## 2018-07-25 MED ORDER — DARBEPOETIN ALFA 300 MCG/0.6ML IJ SOSY
PREFILLED_SYRINGE | INTRAMUSCULAR | Status: AC
  Filled 2018-07-25: qty 0.6

## 2018-07-25 MED ORDER — DARBEPOETIN ALFA 300 MCG/0.6ML IJ SOSY
300.0000 ug | PREFILLED_SYRINGE | INTRAMUSCULAR | Status: DC
  Administered 2018-07-25: 300 ug via SUBCUTANEOUS

## 2018-08-15 ENCOUNTER — Other Ambulatory Visit: Payer: Self-pay

## 2018-08-15 ENCOUNTER — Ambulatory Visit (HOSPITAL_COMMUNITY)
Admission: RE | Admit: 2018-08-15 | Discharge: 2018-08-15 | Disposition: A | Discharge status: Home / Self Care | Payer: Medicare Other | Source: Ambulatory Visit | Attending: Nephrology | Admitting: Nephrology

## 2018-08-15 VITALS — BP 160/76 | HR 74 | Temp 97.9°F

## 2018-08-15 DIAGNOSIS — D631 Anemia in chronic kidney disease: Secondary | ICD-10-CM | POA: Diagnosis not present

## 2018-08-15 DIAGNOSIS — N189 Chronic kidney disease, unspecified: Secondary | ICD-10-CM | POA: Diagnosis not present

## 2018-08-15 LAB — POCT HEMOGLOBIN-HEMACUE: Hemoglobin: 10.8 g/dL — ABNORMAL LOW (ref 13.0–17.0)

## 2018-08-15 MED ORDER — DARBEPOETIN ALFA 300 MCG/0.6ML IJ SOSY
PREFILLED_SYRINGE | INTRAMUSCULAR | Status: AC
  Administered 2018-08-15: 14:00:00 300 ug via SUBCUTANEOUS
  Filled 2018-08-15: qty 0.6

## 2018-08-15 MED ORDER — DARBEPOETIN ALFA 300 MCG/0.6ML IJ SOSY
300.0000 ug | PREFILLED_SYRINGE | INTRAMUSCULAR | Status: DC
  Administered 2018-08-15: 14:00:00 300 ug via SUBCUTANEOUS

## 2018-08-26 DIAGNOSIS — E872 Acidosis: Secondary | ICD-10-CM | POA: Diagnosis not present

## 2018-08-26 DIAGNOSIS — I129 Hypertensive chronic kidney disease with stage 1 through stage 4 chronic kidney disease, or unspecified chronic kidney disease: Secondary | ICD-10-CM | POA: Diagnosis not present

## 2018-08-26 DIAGNOSIS — D631 Anemia in chronic kidney disease: Secondary | ICD-10-CM | POA: Diagnosis not present

## 2018-08-26 DIAGNOSIS — N189 Chronic kidney disease, unspecified: Secondary | ICD-10-CM | POA: Diagnosis not present

## 2018-08-26 DIAGNOSIS — Z940 Kidney transplant status: Secondary | ICD-10-CM | POA: Diagnosis not present

## 2018-08-26 DIAGNOSIS — N2581 Secondary hyperparathyroidism of renal origin: Secondary | ICD-10-CM | POA: Diagnosis not present

## 2018-08-26 DIAGNOSIS — N529 Male erectile dysfunction, unspecified: Secondary | ICD-10-CM | POA: Diagnosis not present

## 2018-08-26 DIAGNOSIS — M109 Gout, unspecified: Secondary | ICD-10-CM | POA: Diagnosis not present

## 2018-08-26 DIAGNOSIS — D899 Disorder involving the immune mechanism, unspecified: Secondary | ICD-10-CM | POA: Diagnosis not present

## 2018-09-04 ENCOUNTER — Ambulatory Visit (HOSPITAL_COMMUNITY)
Admission: RE | Admit: 2018-09-04 | Discharge: 2018-09-04 | Disposition: A | Discharge status: Home / Self Care | Payer: Medicare Other | Source: Ambulatory Visit | Attending: Nephrology | Admitting: Nephrology

## 2018-09-04 ENCOUNTER — Other Ambulatory Visit: Payer: Self-pay

## 2018-09-04 DIAGNOSIS — D631 Anemia in chronic kidney disease: Secondary | ICD-10-CM | POA: Diagnosis not present

## 2018-09-04 DIAGNOSIS — N189 Chronic kidney disease, unspecified: Secondary | ICD-10-CM | POA: Diagnosis not present

## 2018-09-04 LAB — IRON AND TIBC
Iron: 73 ug/dL (ref 45–182)
Saturation Ratios: 42 % — ABNORMAL HIGH (ref 17.9–39.5)
TIBC: 175 ug/dL — ABNORMAL LOW (ref 250–450)
UIBC: 102 ug/dL

## 2018-09-04 LAB — RENAL FUNCTION PANEL
Albumin: 2.8 g/dL — ABNORMAL LOW (ref 3.5–5.0)
Anion gap: 6 (ref 5–15)
BUN: 39 mg/dL — ABNORMAL HIGH (ref 8–23)
CO2: 16 mmol/L — ABNORMAL LOW (ref 22–32)
Calcium: 8.6 mg/dL — ABNORMAL LOW (ref 8.9–10.3)
Chloride: 112 mmol/L — ABNORMAL HIGH (ref 98–111)
Creatinine, Ser: 4.03 mg/dL — ABNORMAL HIGH (ref 0.61–1.24)
GFR calc Af Amer: 16 mL/min — ABNORMAL LOW (ref >60)
GFR calc non Af Amer: 14 mL/min — ABNORMAL LOW (ref >60)
Glucose, Bld: 106 mg/dL — ABNORMAL HIGH (ref 70–99)
Phosphorus: 3.2 mg/dL (ref 2.5–4.6)
Potassium: 4.8 mmol/L (ref 3.5–5.1)
Sodium: 134 mmol/L — ABNORMAL LOW (ref 135–145)

## 2018-09-04 LAB — FERRITIN: Ferritin: 390 ng/mL — ABNORMAL HIGH (ref 24–336)

## 2018-09-04 MED ORDER — DARBEPOETIN ALFA 300 MCG/0.6ML IJ SOSY
300.0000 ug | PREFILLED_SYRINGE | INTRAMUSCULAR | Status: DC
  Administered 2018-09-04: 300 ug via SUBCUTANEOUS

## 2018-09-04 MED ORDER — DARBEPOETIN ALFA 300 MCG/0.6ML IJ SOSY
PREFILLED_SYRINGE | INTRAMUSCULAR | Status: AC
  Filled 2018-09-04: qty 0.6

## 2018-09-05 LAB — PTH, INTACT AND CALCIUM
Calcium, Total (PTH): 8.7 mg/dL (ref 8.6–10.2)
PTH: 48 pg/mL (ref 15–65)

## 2018-09-08 LAB — POCT HEMOGLOBIN-HEMACUE: Hemoglobin: 11.1 g/dL — ABNORMAL LOW (ref 13.0–17.0)

## 2018-09-25 ENCOUNTER — Encounter (HOSPITAL_COMMUNITY): Payer: PRIVATE HEALTH INSURANCE

## 2018-09-26 ENCOUNTER — Ambulatory Visit (HOSPITAL_COMMUNITY)
Admission: RE | Admit: 2018-09-26 | Discharge: 2018-09-26 | Disposition: A | Discharge status: Home / Self Care | Payer: Medicare Other | Source: Ambulatory Visit | Attending: Nephrology | Admitting: Nephrology

## 2018-09-26 VITALS — BP 160/76 | HR 75 | Temp 97.4°F | Resp 20

## 2018-09-26 DIAGNOSIS — N189 Chronic kidney disease, unspecified: Secondary | ICD-10-CM | POA: Insufficient documentation

## 2018-09-26 DIAGNOSIS — D631 Anemia in chronic kidney disease: Secondary | ICD-10-CM | POA: Diagnosis not present

## 2018-09-26 LAB — POCT HEMOGLOBIN-HEMACUE: Hemoglobin: 11.8 g/dL — ABNORMAL LOW (ref 13.0–17.0)

## 2018-09-26 MED ORDER — DARBEPOETIN ALFA 300 MCG/0.6ML IJ SOSY
300.0000 ug | PREFILLED_SYRINGE | INTRAMUSCULAR | Status: DC
  Administered 2018-09-26: 300 ug via SUBCUTANEOUS

## 2018-09-26 MED ORDER — DARBEPOETIN ALFA 300 MCG/0.6ML IJ SOSY
PREFILLED_SYRINGE | INTRAMUSCULAR | Status: AC
  Filled 2018-09-26: qty 0.6

## 2018-10-17 ENCOUNTER — Ambulatory Visit (HOSPITAL_COMMUNITY)
Admission: RE | Admit: 2018-10-17 | Discharge: 2018-10-17 | Disposition: A | Discharge status: Home / Self Care | Payer: Medicare Other | Source: Ambulatory Visit | Attending: Nephrology | Admitting: Nephrology

## 2018-10-17 VITALS — BP 177/72 | HR 71 | Temp 97.7°F

## 2018-10-17 DIAGNOSIS — D631 Anemia in chronic kidney disease: Secondary | ICD-10-CM | POA: Diagnosis not present

## 2018-10-17 DIAGNOSIS — N189 Chronic kidney disease, unspecified: Secondary | ICD-10-CM | POA: Diagnosis not present

## 2018-10-17 LAB — POCT HEMOGLOBIN-HEMACUE: Hemoglobin: 11.7 g/dL — ABNORMAL LOW (ref 13.0–17.0)

## 2018-10-17 MED ORDER — DARBEPOETIN ALFA 300 MCG/0.6ML IJ SOSY
300.0000 ug | PREFILLED_SYRINGE | INTRAMUSCULAR | Status: DC
  Administered 2018-10-17: 300 ug via SUBCUTANEOUS

## 2018-10-17 MED ORDER — DARBEPOETIN ALFA 300 MCG/0.6ML IJ SOSY
PREFILLED_SYRINGE | INTRAMUSCULAR | Status: AC
  Filled 2018-10-17: qty 0.6

## 2018-11-07 ENCOUNTER — Other Ambulatory Visit: Payer: Self-pay

## 2018-11-07 ENCOUNTER — Ambulatory Visit (HOSPITAL_COMMUNITY)
Admission: RE | Admit: 2018-11-07 | Discharge: 2018-11-07 | Disposition: A | Discharge status: Home / Self Care | Payer: Medicare Other | Source: Ambulatory Visit | Attending: Nephrology | Admitting: Nephrology

## 2018-11-07 VITALS — BP 179/76 | HR 71 | Temp 97.7°F | Resp 20

## 2018-11-07 DIAGNOSIS — D631 Anemia in chronic kidney disease: Secondary | ICD-10-CM

## 2018-11-07 DIAGNOSIS — N189 Chronic kidney disease, unspecified: Secondary | ICD-10-CM | POA: Insufficient documentation

## 2018-11-07 MED ORDER — DARBEPOETIN ALFA 300 MCG/0.6ML IJ SOSY
300.0000 ug | PREFILLED_SYRINGE | INTRAMUSCULAR | Status: DC
  Administered 2018-11-07: 15:00:00 300 ug via SUBCUTANEOUS

## 2018-11-07 MED ORDER — DARBEPOETIN ALFA 300 MCG/0.6ML IJ SOSY
PREFILLED_SYRINGE | INTRAMUSCULAR | Status: AC
  Administered 2018-11-07: 300 ug via SUBCUTANEOUS
  Filled 2018-11-07: qty 0.6

## 2018-11-11 LAB — POCT HEMOGLOBIN-HEMACUE: Hemoglobin: 11.1 g/dL — ABNORMAL LOW (ref 13.0–17.0)

## 2018-11-28 ENCOUNTER — Ambulatory Visit (HOSPITAL_COMMUNITY)
Admission: RE | Admit: 2018-11-28 | Discharge: 2018-11-28 | Disposition: A | Discharge status: Home / Self Care | Payer: Medicare Other | Source: Ambulatory Visit | Attending: Nephrology | Admitting: Nephrology

## 2018-11-28 ENCOUNTER — Other Ambulatory Visit: Payer: Self-pay

## 2018-11-28 VITALS — BP 153/82 | HR 72 | Temp 97.3°F | Resp 20

## 2018-11-28 DIAGNOSIS — D631 Anemia in chronic kidney disease: Secondary | ICD-10-CM | POA: Insufficient documentation

## 2018-11-28 DIAGNOSIS — N189 Chronic kidney disease, unspecified: Secondary | ICD-10-CM | POA: Diagnosis not present

## 2018-11-28 LAB — POCT HEMOGLOBIN-HEMACUE: Hemoglobin: 10.8 g/dL — ABNORMAL LOW (ref 13.0–17.0)

## 2018-11-28 MED ORDER — DARBEPOETIN ALFA 300 MCG/0.6ML IJ SOSY
PREFILLED_SYRINGE | INTRAMUSCULAR | Status: AC
  Administered 2018-11-28: 300 ug
  Filled 2018-11-28: qty 0.6

## 2018-11-28 MED ORDER — DARBEPOETIN ALFA 300 MCG/0.6ML IJ SOSY: 300.0000 ug | PREFILLED_SYRINGE | INTRAMUSCULAR | Status: DC

## 2018-12-04 DIAGNOSIS — N2581 Secondary hyperparathyroidism of renal origin: Secondary | ICD-10-CM | POA: Diagnosis not present

## 2018-12-04 DIAGNOSIS — M109 Gout, unspecified: Secondary | ICD-10-CM | POA: Diagnosis not present

## 2018-12-04 DIAGNOSIS — N189 Chronic kidney disease, unspecified: Secondary | ICD-10-CM | POA: Diagnosis not present

## 2018-12-04 DIAGNOSIS — D631 Anemia in chronic kidney disease: Secondary | ICD-10-CM | POA: Diagnosis not present

## 2018-12-04 DIAGNOSIS — D899 Disorder involving the immune mechanism, unspecified: Secondary | ICD-10-CM | POA: Diagnosis not present

## 2018-12-04 DIAGNOSIS — Z940 Kidney transplant status: Secondary | ICD-10-CM | POA: Diagnosis not present

## 2018-12-04 DIAGNOSIS — N529 Male erectile dysfunction, unspecified: Secondary | ICD-10-CM | POA: Diagnosis not present

## 2018-12-04 DIAGNOSIS — I129 Hypertensive chronic kidney disease with stage 1 through stage 4 chronic kidney disease, or unspecified chronic kidney disease: Secondary | ICD-10-CM | POA: Diagnosis not present

## 2018-12-04 DIAGNOSIS — E872 Acidosis: Secondary | ICD-10-CM | POA: Diagnosis not present

## 2018-12-18 ENCOUNTER — Other Ambulatory Visit (HOSPITAL_COMMUNITY): Payer: Self-pay | Admitting: *Deleted

## 2018-12-19 ENCOUNTER — Encounter (HOSPITAL_COMMUNITY)
Admission: RE | Admit: 2018-12-19 | Discharge: 2018-12-19 | Disposition: A | Discharge status: Home / Self Care | Payer: Medicare Other | Source: Ambulatory Visit | Attending: Nephrology | Admitting: Nephrology

## 2018-12-19 ENCOUNTER — Other Ambulatory Visit: Payer: Self-pay

## 2018-12-19 VITALS — BP 152/74 | HR 73 | Resp 18

## 2018-12-19 DIAGNOSIS — N189 Chronic kidney disease, unspecified: Secondary | ICD-10-CM | POA: Insufficient documentation

## 2018-12-19 DIAGNOSIS — D631 Anemia in chronic kidney disease: Secondary | ICD-10-CM | POA: Diagnosis not present

## 2018-12-19 LAB — CBC WITH DIFFERENTIAL/PLATELET
Abs Immature Granulocytes: 0.04 10*3/uL (ref 0.00–0.07)
Basophils Absolute: 0 10*3/uL (ref 0.0–0.1)
Basophils Relative: 0 %
Eosinophils Absolute: 0 10*3/uL (ref 0.0–0.5)
Eosinophils Relative: 0 %
HCT: 33.3 % — ABNORMAL LOW (ref 39.0–52.0)
Hemoglobin: 10.9 g/dL — ABNORMAL LOW (ref 13.0–17.0)
Immature Granulocytes: 1 %
Lymphocytes Relative: 15 %
Lymphs Abs: 0.9 10*3/uL (ref 0.7–4.0)
MCH: 29 pg (ref 26.0–34.0)
MCHC: 32.7 g/dL (ref 30.0–36.0)
MCV: 88.6 fL (ref 80.0–100.0)
Monocytes Absolute: 0.1 10*3/uL (ref 0.1–1.0)
Monocytes Relative: 2 %
Neutro Abs: 5.1 10*3/uL (ref 1.7–7.7)
Neutrophils Relative %: 82 %
Platelets: 197 10*3/uL (ref 150–400)
RBC: 3.76 MIL/uL — ABNORMAL LOW (ref 4.22–5.81)
RDW: 15 % (ref 11.5–15.5)
WBC: 6.2 10*3/uL (ref 4.0–10.5)
nRBC: 0 % (ref 0.0–0.2)

## 2018-12-19 LAB — RENAL FUNCTION PANEL
Albumin: 2.9 g/dL — ABNORMAL LOW (ref 3.5–5.0)
Anion gap: 4 — ABNORMAL LOW (ref 5–15)
BUN: 51 mg/dL — ABNORMAL HIGH (ref 8–23)
CO2: 17 mmol/L — ABNORMAL LOW (ref 22–32)
Calcium: 8.4 mg/dL — ABNORMAL LOW (ref 8.9–10.3)
Chloride: 111 mmol/L (ref 98–111)
Creatinine, Ser: 4.63 mg/dL — ABNORMAL HIGH (ref 0.61–1.24)
GFR calc Af Amer: 14 mL/min — ABNORMAL LOW (ref >60)
GFR calc non Af Amer: 12 mL/min — ABNORMAL LOW (ref >60)
Glucose, Bld: 118 mg/dL — ABNORMAL HIGH (ref 70–99)
Phosphorus: 2.4 mg/dL — ABNORMAL LOW (ref 2.5–4.6)
Potassium: 5.7 mmol/L — ABNORMAL HIGH (ref 3.5–5.1)
Sodium: 132 mmol/L — ABNORMAL LOW (ref 135–145)

## 2018-12-19 LAB — IRON AND TIBC
Iron: 78 ug/dL (ref 45–182)
Saturation Ratios: 48 % — ABNORMAL HIGH (ref 17.9–39.5)
TIBC: 162 ug/dL — ABNORMAL LOW (ref 250–450)
UIBC: 84 ug/dL

## 2018-12-19 LAB — MAGNESIUM: Magnesium: 1.6 mg/dL — ABNORMAL LOW (ref 1.7–2.4)

## 2018-12-19 LAB — POCT HEMOGLOBIN-HEMACUE: Hemoglobin: 11.1 g/dL — ABNORMAL LOW (ref 13.0–17.0)

## 2018-12-19 LAB — FERRITIN: Ferritin: 384 ng/mL — ABNORMAL HIGH (ref 24–336)

## 2018-12-19 MED ORDER — DARBEPOETIN ALFA 300 MCG/0.6ML IJ SOSY
PREFILLED_SYRINGE | INTRAMUSCULAR | Status: AC
  Administered 2018-12-19: 300 ug via SUBCUTANEOUS
  Filled 2018-12-19: qty 0.6

## 2018-12-19 MED ORDER — DARBEPOETIN ALFA 300 MCG/0.6ML IJ SOSY
300.0000 ug | PREFILLED_SYRINGE | INTRAMUSCULAR | Status: DC
  Administered 2018-12-19: 14:00:00 300 ug via SUBCUTANEOUS

## 2018-12-20 LAB — PTH, INTACT AND CALCIUM
Calcium, Total (PTH): 8.7 mg/dL (ref 8.6–10.2)
PTH: 84 pg/mL — ABNORMAL HIGH (ref 15–65)

## 2018-12-21 LAB — TACROLIMUS LEVEL: Tacrolimus (FK506) - LabCorp: 7.7 ng/mL (ref 2.0–20.0)

## 2018-12-31 ENCOUNTER — Emergency Department (HOSPITAL_COMMUNITY): Payer: Medicare Other

## 2018-12-31 ENCOUNTER — Inpatient Hospital Stay (HOSPITAL_COMMUNITY)
Admission: EM | Admit: 2018-12-31 | Discharge: 2019-01-06 | DRG: 177 | Disposition: A | Discharge status: Home / Self Care | Payer: Medicare Other | Attending: Internal Medicine | Admitting: Internal Medicine

## 2018-12-31 ENCOUNTER — Other Ambulatory Visit: Payer: Self-pay

## 2018-12-31 DIAGNOSIS — I120 Hypertensive chronic kidney disease with stage 5 chronic kidney disease or end stage renal disease: Secondary | ICD-10-CM | POA: Diagnosis present

## 2018-12-31 DIAGNOSIS — N185 Chronic kidney disease, stage 5: Secondary | ICD-10-CM | POA: Diagnosis present

## 2018-12-31 DIAGNOSIS — R739 Hyperglycemia, unspecified: Secondary | ICD-10-CM | POA: Diagnosis not present

## 2018-12-31 DIAGNOSIS — Z79899 Other long term (current) drug therapy: Secondary | ICD-10-CM

## 2018-12-31 DIAGNOSIS — Z681 Body mass index (BMI) 19 or less, adult: Secondary | ICD-10-CM

## 2018-12-31 DIAGNOSIS — N1832 Chronic kidney disease, stage 3b: Secondary | ICD-10-CM | POA: Diagnosis not present

## 2018-12-31 DIAGNOSIS — U071 COVID-19: Secondary | ICD-10-CM | POA: Diagnosis present

## 2018-12-31 DIAGNOSIS — E875 Hyperkalemia: Secondary | ICD-10-CM | POA: Diagnosis present

## 2018-12-31 DIAGNOSIS — Z940 Kidney transplant status: Secondary | ICD-10-CM

## 2018-12-31 DIAGNOSIS — J189 Pneumonia, unspecified organism: Secondary | ICD-10-CM | POA: Diagnosis present

## 2018-12-31 DIAGNOSIS — J1282 Pneumonia due to coronavirus disease 2019: Secondary | ICD-10-CM | POA: Diagnosis present

## 2018-12-31 DIAGNOSIS — D631 Anemia in chronic kidney disease: Secondary | ICD-10-CM | POA: Diagnosis present

## 2018-12-31 DIAGNOSIS — N179 Acute kidney failure, unspecified: Secondary | ICD-10-CM | POA: Diagnosis present

## 2018-12-31 DIAGNOSIS — R05 Cough: Secondary | ICD-10-CM | POA: Diagnosis not present

## 2018-12-31 DIAGNOSIS — I10 Essential (primary) hypertension: Secondary | ICD-10-CM | POA: Diagnosis not present

## 2018-12-31 DIAGNOSIS — R64 Cachexia: Secondary | ICD-10-CM | POA: Diagnosis present

## 2018-12-31 DIAGNOSIS — J159 Unspecified bacterial pneumonia: Secondary | ICD-10-CM | POA: Diagnosis present

## 2018-12-31 DIAGNOSIS — J9600 Acute respiratory failure, unspecified whether with hypoxia or hypercapnia: Secondary | ICD-10-CM | POA: Diagnosis present

## 2018-12-31 DIAGNOSIS — N189 Chronic kidney disease, unspecified: Secondary | ICD-10-CM | POA: Diagnosis present

## 2018-12-31 DIAGNOSIS — Z792 Long term (current) use of antibiotics: Secondary | ICD-10-CM | POA: Diagnosis not present

## 2018-12-31 DIAGNOSIS — J1289 Other viral pneumonia: Secondary | ICD-10-CM | POA: Diagnosis present

## 2018-12-31 DIAGNOSIS — Z66 Do not resuscitate: Secondary | ICD-10-CM | POA: Diagnosis present

## 2018-12-31 DIAGNOSIS — E872 Acidosis: Secondary | ICD-10-CM | POA: Diagnosis present

## 2018-12-31 DIAGNOSIS — J9601 Acute respiratory failure with hypoxia: Secondary | ICD-10-CM | POA: Diagnosis present

## 2018-12-31 DIAGNOSIS — R0600 Dyspnea, unspecified: Secondary | ICD-10-CM | POA: Diagnosis not present

## 2018-12-31 LAB — LACTIC ACID, PLASMA
Lactic Acid, Venous: 1 mmol/L (ref 0.5–1.9)
Lactic Acid, Venous: 1.7 mmol/L (ref 0.5–1.9)

## 2018-12-31 LAB — COMPREHENSIVE METABOLIC PANEL
ALT: 15 U/L (ref 0–44)
AST: 27 U/L (ref 15–41)
Albumin: 2.7 g/dL — ABNORMAL LOW (ref 3.5–5.0)
Alkaline Phosphatase: 60 U/L (ref 38–126)
Anion gap: 6 (ref 5–15)
BUN: 45 mg/dL — ABNORMAL HIGH (ref 8–23)
CO2: 19 mmol/L — ABNORMAL LOW (ref 22–32)
Calcium: 8.4 mg/dL — ABNORMAL LOW (ref 8.9–10.3)
Chloride: 110 mmol/L (ref 98–111)
Creatinine, Ser: 4.79 mg/dL — ABNORMAL HIGH (ref 0.61–1.24)
GFR calc Af Amer: 13 mL/min — ABNORMAL LOW (ref >60)
GFR calc non Af Amer: 11 mL/min — ABNORMAL LOW (ref >60)
Glucose, Bld: 94 mg/dL (ref 70–99)
Potassium: 5.6 mmol/L — ABNORMAL HIGH (ref 3.5–5.1)
Sodium: 135 mmol/L (ref 135–145)
Total Bilirubin: 1.4 mg/dL — ABNORMAL HIGH (ref 0.3–1.2)
Total Protein: 8.7 g/dL — ABNORMAL HIGH (ref 6.5–8.1)

## 2018-12-31 LAB — CBC WITH DIFFERENTIAL/PLATELET
Abs Immature Granulocytes: 0.11 10*3/uL — ABNORMAL HIGH (ref 0.00–0.07)
Basophils Absolute: 0 10*3/uL (ref 0.0–0.1)
Basophils Relative: 0 %
Eosinophils Absolute: 0.1 10*3/uL (ref 0.0–0.5)
Eosinophils Relative: 0 %
HCT: 37 % — ABNORMAL LOW (ref 39.0–52.0)
Hemoglobin: 12.5 g/dL — ABNORMAL LOW (ref 13.0–17.0)
Immature Granulocytes: 1 %
Lymphocytes Relative: 13 %
Lymphs Abs: 2 10*3/uL (ref 0.7–4.0)
MCH: 29.8 pg (ref 26.0–34.0)
MCHC: 33.8 g/dL (ref 30.0–36.0)
MCV: 88.3 fL (ref 80.0–100.0)
Monocytes Absolute: 1.8 10*3/uL — ABNORMAL HIGH (ref 0.1–1.0)
Monocytes Relative: 12 %
Neutro Abs: 11.7 10*3/uL — ABNORMAL HIGH (ref 1.7–7.7)
Neutrophils Relative %: 74 %
Platelets: 166 10*3/uL (ref 150–400)
RBC: 4.19 MIL/uL — ABNORMAL LOW (ref 4.22–5.81)
RDW: 15.5 % (ref 11.5–15.5)
WBC: 15.7 10*3/uL — ABNORMAL HIGH (ref 4.0–10.5)
nRBC: 0 % (ref 0.0–0.2)

## 2018-12-31 MED ORDER — HEPARIN SODIUM (PORCINE) 5000 UNIT/ML IJ SOLN
5000.0000 [IU] | Freq: Three times a day (TID) | INTRAMUSCULAR | Status: DC
  Administered 2018-12-31 – 2019-01-06 (×17): 5000 [IU] via SUBCUTANEOUS
  Filled 2018-12-31 (×17): qty 1

## 2018-12-31 MED ORDER — LABETALOL HCL 100 MG PO TABS
300.0000 mg | ORAL_TABLET | Freq: Two times a day (BID) | ORAL | Status: DC
  Administered 2018-12-31 – 2019-01-05 (×10): 300 mg via ORAL
  Filled 2018-12-31: qty 2
  Filled 2018-12-31 (×6): qty 3
  Filled 2018-12-31: qty 2
  Filled 2018-12-31 (×2): qty 3

## 2018-12-31 MED ORDER — SODIUM CHLORIDE 0.9 % IV SOLN: 250.0000 mL | INTRAVENOUS | Status: DC | PRN

## 2018-12-31 MED ORDER — SODIUM CHLORIDE 0.9% FLUSH
3.0000 mL | Freq: Two times a day (BID) | INTRAVENOUS | Status: DC
  Administered 2019-01-02 – 2019-01-04 (×5): 3 mL via INTRAVENOUS

## 2018-12-31 MED ORDER — SODIUM CHLORIDE 0.9 % IV SOLN
INTRAVENOUS | Status: DC
  Administered 2018-12-31: 20:00:00 via INTRAVENOUS

## 2018-12-31 MED ORDER — ONDANSETRON HCL 4 MG PO TABS
4.0000 mg | ORAL_TABLET | Freq: Four times a day (QID) | ORAL | Status: DC | PRN
  Administered 2019-01-06: 4 mg via ORAL
  Filled 2018-12-31 (×2): qty 1

## 2018-12-31 MED ORDER — SODIUM CHLORIDE 0.9% FLUSH
3.0000 mL | Freq: Two times a day (BID) | INTRAVENOUS | Status: DC
  Administered 2019-01-02 – 2019-01-06 (×9): 3 mL via INTRAVENOUS

## 2018-12-31 MED ORDER — ACETAMINOPHEN 325 MG PO TABS
650.0000 mg | ORAL_TABLET | Freq: Four times a day (QID) | ORAL | Status: DC | PRN
  Administered 2019-01-01: 650 mg via ORAL
  Filled 2018-12-31: qty 2

## 2018-12-31 MED ORDER — PREDNISONE 1 MG PO TABS
1.0000 mg | ORAL_TABLET | Freq: Every day | ORAL | Status: DC
  Administered 2018-12-31: 1 mg via ORAL
  Filled 2018-12-31 (×2): qty 1

## 2018-12-31 MED ORDER — SODIUM CHLORIDE 0.9 % IV SOLN
1.0000 g | INTRAVENOUS | Status: DC
  Administered 2019-01-01 – 2019-01-04 (×4): 1 g via INTRAVENOUS
  Filled 2018-12-31 (×5): qty 10

## 2018-12-31 MED ORDER — ONDANSETRON HCL 4 MG/2ML IJ SOLN
4.0000 mg | Freq: Four times a day (QID) | INTRAMUSCULAR | Status: DC | PRN
  Filled 2018-12-31: qty 2

## 2018-12-31 MED ORDER — SODIUM CHLORIDE 0.9 % IV SOLN
500.0000 mg | INTRAVENOUS | Status: DC
  Administered 2018-12-31 – 2019-01-01 (×2): 500 mg via INTRAVENOUS
  Filled 2018-12-31: qty 500

## 2018-12-31 MED ORDER — SODIUM CHLORIDE 0.9 % IV SOLN
2.0000 g | INTRAVENOUS | Status: DC
  Administered 2018-12-31: 2 g via INTRAVENOUS
  Filled 2018-12-31: qty 20

## 2018-12-31 MED ORDER — ACETAMINOPHEN 650 MG RE SUPP: 650.0000 mg | Freq: Four times a day (QID) | RECTAL | Status: DC | PRN

## 2018-12-31 MED ORDER — SODIUM CHLORIDE 0.9% FLUSH: 3.0000 mL | INTRAVENOUS | Status: DC | PRN

## 2018-12-31 MED ORDER — LABETALOL HCL 5 MG/ML IV SOLN
10.0000 mg | INTRAVENOUS | Status: DC | PRN
  Administered 2019-01-01: 10 mg via INTRAVENOUS
  Filled 2018-12-31: qty 4

## 2018-12-31 MED ORDER — SODIUM CHLORIDE 0.9 % IV SOLN
500.0000 mg | INTRAVENOUS | Status: DC
  Filled 2018-12-31: qty 500

## 2018-12-31 MED ORDER — AMLODIPINE BESYLATE 10 MG PO TABS
10.0000 mg | ORAL_TABLET | Freq: Every day | ORAL | Status: DC
  Administered 2018-12-31 – 2019-01-06 (×7): 10 mg via ORAL
  Filled 2018-12-31: qty 1
  Filled 2018-12-31: qty 2
  Filled 2018-12-31: qty 1
  Filled 2018-12-31: qty 2
  Filled 2018-12-31 (×3): qty 1

## 2018-12-31 MED ORDER — SODIUM ZIRCONIUM CYCLOSILICATE 5 G PO PACK
5.0000 g | PACK | Freq: Once | ORAL | Status: AC
  Administered 2018-12-31: 5 g via ORAL
  Filled 2018-12-31: qty 1

## 2018-12-31 MED ORDER — ACETAMINOPHEN 325 MG PO TABS
650.0000 mg | ORAL_TABLET | Freq: Once | ORAL | Status: AC
  Administered 2018-12-31: 650 mg via ORAL
  Filled 2018-12-31: qty 2

## 2018-12-31 NOTE — ED Triage Notes (Signed)
Pt endorses going to [place] over the weekend and came back with cold sx, scratchy throat, runny nose, dry cough and chills. Has been taking cold medicine yesterday with relief. Hx of kidney transplant 2002. Axox4. Oral temp 99.4, HR 107

## 2018-12-31 NOTE — H&P (Addendum)
History and Physical    Francisco Williamson [id number] DOB: Dec 29, 1945 DOA: 12/31/2018  PCP: Default, Provider, MD  Patient coming from: Home  Chief Complaint: Cold symptoms, scratchy throat, runny nose, dry cough, chills   HPI: Mandell Trombly is a 73 y.o. male with medical history significant of history of kidney transplant in 2002 who presents after a recent trip to [place]. 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 No known sick contacts to his knowledge.  He states that he has had some subjective fever, chills, congestion, dry cough.  Denies any anosmia.  He denies any chest pain, nausea, vomiting, diarrhea or abdominal pain.  His girlfriend of 33 years whom he resides with his at bedside.  She also traveled with him over the weekend and is currently feeling well without any symptoms.  ED Course: Labs revealed Cr 4.79, K 5.6, WBC 15.7. CXR showed increased bronchial and interstitial prominence RLL consistent with acute bronchitis or potentially early pneumonia. Patient started on rocephin/azithromycin. Asked EDP to start IVF as well as treatment for hyperkalemia. COVID pending. Patient will be admitted under PUI.   Review of Systems: As per HPI. Otherwise, all other review of systems reviewed and are negative.   Past Medical History:  Diagnosis Date   Hypertension    Renal disorder     Past Surgical History:  Procedure Laterality Date   NEPHRECTOMY TRANSPLANTED ORGAN       reports that he has never smoked. He does not have any smokeless tobacco history on file. He reports that he does not drink alcohol or use drugs.  No Known Allergies  Family history unknown.   Prior to Admission medications   Medication Sig Start Date End Date Taking? Authorizing Provider  amLODipine (NORVASC) 10 MG tablet Take 10 mg by mouth daily. 06/17/14   [provider]  calcitRIOL (ROCALTROL) 0.5 MCG  capsule Take 0.5 mcg by mouth daily. 06/17/14   [provider]  IRON PO Take 1 tablet by mouth daily.     [provider]  labetalol (NORMODYNE) 300 MG tablet Take 300 mg by mouth 2 (two) times daily. 06/17/14   [provider]  PREDNISONE PO Take 1 mg by mouth daily.     [provider]  sulfamethoxazole-trimethoprim (BACTRIM,SEPTRA) 400-80 MG per tablet Take 80-4,001 tablets by mouth once a week. 3x weekly 07/20/14   [provider]    Physical Exam: Vitals:   12/31/18 0813 12/31/18 1304  BP: (!) 176/78 (!) 158/67  Pulse: (!) 107 94  Resp: 16 18  Temp: 99.4 F (37.4 C) 98.6 F (37 C)  TempSrc: Oral Oral  SpO2: 98% 97%     Constitutional: NAD, calm, comfortable Eyes: PERRL, lids and conjunctivae normal ENMT: Deferred as patient wearing a face mask  Respiratory: Clear to auscultation bilaterally, no wheezing, diminished at bases. Normal respiratory effort. No accessory muscle use. No conversational dyspnea  Cardiovascular: Regular rate and rhythm, no murmurs. No extremity edema.  Abdomen: Soft, nondistended, nontender to palpation. Bowel sounds positive.  Musculoskeletal: No joint deformity upper and lower extremities. No contractures. Normal muscle tone.  Skin: no rashes, lesions, ulcers on exposed skin  Neurologic: Alert and oriented, speech fluent, CN 2-12 grossly intact. No focal deficits.   Psychiatric: Normal judgment and insight. Normal mood and affect   Labs on Admission: I have personally reviewed following labs and imaging studies  CBC: Recent Labs  Lab 12/31/18 0821  WBC 15.7*  NEUTROABS 11.7*  HGB 12.5*  HCT 37.0*  MCV 88.3  PLT [id number]   Basic Metabolic Panel: Recent Labs  Lab 12/31/18 0821  NA 135  K 5.6*  CL 110  CO2 19*  GLUCOSE 94  BUN 45*  CREATININE 4.79*  CALCIUM 8.4*   GFR: CrCl cannot be calculated (Unknown ideal weight.). Liver Function Tests: Recent Labs  Lab 12/31/18 0821  AST 27  ALT 15    ALKPHOS 60  BILITOT 1.4*  PROT 8.7*  ALBUMIN 2.7*   No results for input(s): LIPASE, AMYLASE in the last 168 hours. No results for input(s): AMMONIA in the last 168 hours. Coagulation Profile: No results for input(s): INR, PROTIME in the last 168 hours. Cardiac Enzymes: No results for input(s): CKTOTAL, CKMB, CKMBINDEX, TROPONINI in the last 168 hours. BNP (last 3 results) No results for input(s): PROBNP in the last 8760 hours. HbA1C: No results for input(s): HGBA1C in the last 72 hours. CBG: No results for input(s): GLUCAP in the last 168 hours. Lipid Profile: No results for input(s): CHOL, HDL, LDLCALC, TRIG, CHOLHDL, LDLDIRECT in the last 72 hours. Thyroid Function Tests: No results for input(s): TSH, T4TOTAL, FREET4, T3FREE, THYROIDAB in the last 72 hours. Anemia Panel: No results for input(s): VITAMINB12, FOLATE, FERRITIN, TIBC, IRON, RETICCTPCT in the last 72 hours. Urine analysis:    Component Value Date/Time   COLORURINE YELLOW 04/27/2016 [place] 04/27/2016 1416   LABSPEC 1.012 04/27/2016 1416   PHURINE 6.0 04/27/2016 1416   GLUCOSEU NEGATIVE 04/27/2016 1416   HGBUR NEGATIVE 04/27/2016 1416   BILIRUBINUR NEGATIVE 04/27/2016 1416   KETONESUR NEGATIVE 04/27/2016 1416   PROTEINUR 30 (A) 04/27/2016 1416   UROBILINOGEN 1.0 12/20/2014 1252   NITRITE NEGATIVE 04/27/2016 1416   LEUKOCYTESUR TRACE (A) 04/27/2016 1416   Sepsis Labs: !!!!!!!!!!!!!!!!!!!!!!!!!!!!!!!!!!!!!!!!!!!! @LABRCNTIP (procalcitonin:4,lacticidven:4) )No results found for this or any previous visit (from the past 240 hour(s)).   Radiological Exams on Admission: Dg Chest 2 View  Result Date: 12/31/2018 CLINICAL DATA:  Sore throat, runny nose, cough and chills. EXAM: CHEST - 2 VIEW COMPARISON:  05/13/2013 FINDINGS: The heart size and mediastinal contours are within normal limits. Superimposed on chronic lung disease is some increased bronchial and interstitial prominence in the right  lower lung which may be consistent with acute bronchitis and potentially early pneumonia. No pulmonary edema. The visualized skeletal structures are unremarkable. IMPRESSION: Increased bronchial and interstitial prominence in the right lower lung which may be consistent with acute bronchitis and potentially early pneumonia. Electronically Signed   By: Aletta Edouard M.D.   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 If positive will need Covid related inflammatory marker labs. Admit under PUI for now due to recent travel out of state and exposure risk  -Blood cultures pending  -Rocephin/azithromycin -Trend CBC   CKD stage 5 -Hx of renal transplant in 2002. Continue tacrolimus, prednisone   -Baseline ~4. It was 4.63 on 10/16 -Followed by Dr. [name]  Hyperkalemia -Lokalma in the ED -Trend BMP   HTN -Continue labetalol, norvasc   PPE worn by physician: N95, face shield, gown, gloves Patient wearing a cloth face mask   DVT prophylaxis: Subq hep  Code Status: DNR, confirmed with patient at time of admission  Family Communication: Girlfriend at bedside Disposition Plan: Pending improvement  Consults called: None   Admission status: Inpatient   * I certify that at the point of admission it is my clinical judgment that the patient will require inpatient hospital care spanning beyond 2 midnights from the point of admission due to high intensity of service, high risk for further deterioration and high frequency of surveillance required.Dessa Phi, DO [facility] 12/31/2018, 5:38 PM   Available via Epic secure chat 7am-7pm After these hours, please refer to coverage provider listed on [email or web address]

## 2018-12-31 NOTE — ED Provider Notes (Addendum)
[facility]PART[facility] Provider Note   CSN: [id number] Arrival date & time: 12/31/18  0749     History   Chief Complaint Chief Complaint  Patient presents with  . URI    HPI Francisco Williamson is a 73 y.o. male.     73 year old male presents with cough congestion scratchy throat after returning from a trip to [place].  Last night had emesis with shaking chills.  Did not take his temperature.  Increased dyspnea on exertion.  Denies any chest or abdominal discomfort.  Symptoms progressively worse no treatment use prior to arrival.     Past Medical History:  Diagnosis Date  . Hypertension   . Renal disorder     Patient Active Problem List   Diagnosis Date Noted  . Anemia in chronic kidney disease 09/29/2015    Past Surgical History:  Procedure Laterality Date  . NEPHRECTOMY TRANSPLANTED ORGAN          Home Medications    Prior to Admission medications   Medication Sig Start Date End Date Taking? Authorizing Provider  amLODipine (NORVASC) 10 MG tablet Take 10 mg by mouth daily. 06/17/14   [provider]  calcitRIOL (ROCALTROL) 0.5 MCG capsule Take 0.5 mcg by mouth daily. 06/17/14   [provider]  IRON PO Take 1 tablet by mouth daily.     [provider]  labetalol (NORMODYNE) 300 MG tablet Take 300 mg by mouth 2 (two) times daily. 06/17/14   [provider]  PREDNISONE PO Take 1 mg by mouth daily.     [provider]  sulfamethoxazole-trimethoprim (BACTRIM,SEPTRA) 400-80 MG per tablet Take 80-4,001 tablets by mouth once a week. 3x weekly 07/20/14   [provider]    Family History No family history on file.  Social History Social History   Tobacco Use  . Smoking status: Never Smoker  Substance Use Topics  . Alcohol use: No  . Drug use: No     Allergies   Patient has no known allergies.   Review of Systems Review of Systems  All other systems reviewed and are negative.     Physical Exam Updated Vital Signs BP (!) 158/67 (BP Location: Right Arm)   Pulse 94   Temp 98.6 F (37 C) (Oral)   Resp 18   SpO2 97%   Physical Exam Vitals signs and nursing note reviewed.  Constitutional:      General: He is not in acute distress.    Appearance: Normal appearance. He is well-developed. He is not toxic-appearing.  HENT:     Head: Normocephalic and atraumatic.  Eyes:     General: Lids are normal.     Conjunctiva/sclera: Conjunctivae normal.     Pupils: Pupils are equal, round, and reactive to light.  Neck:     Musculoskeletal: Normal range of motion and neck supple.     Thyroid: No thyroid mass.     Trachea: No tracheal deviation.  Cardiovascular:     Rate and Rhythm: Regular rhythm. Tachycardia present.     Heart sounds: Normal heart sounds. No murmur. No gallop.   Pulmonary:     Effort: Pulmonary effort is normal. No respiratory distress.     Breath sounds: No stridor. Examination of the right-upper field reveals decreased breath sounds. Examination of the left-upper field reveals decreased breath sounds. Decreased breath sounds present. No wheezing, rhonchi or rales.  Abdominal:     General: Bowel sounds are normal. There is no distension.  Palpations: Abdomen is soft.     Tenderness: There is no abdominal tenderness. There is no rebound.  Musculoskeletal: Normal range of motion.        General: No tenderness.  Skin:    General: Skin is warm and dry.     Findings: No abrasion or rash.  Neurological:     Mental Status: He is alert and oriented to person, place, and time.     GCS: GCS eye subscore is 4. GCS verbal subscore is 5. GCS motor subscore is 6.     Cranial Nerves: No cranial nerve deficit.     Sensory: No sensory deficit.  Psychiatric:        Speech: Speech normal.        Behavior: Behavior normal.      ED Treatments / Results  Labs (all labs ordered are listed, but only abnormal results are displayed) Labs Reviewed   COMPREHENSIVE METABOLIC PANEL - Abnormal; Notable for the following components:      Result Value   Potassium 5.6 (*)    CO2 19 (*)    BUN 45 (*)    Creatinine, Ser 4.79 (*)    Calcium 8.4 (*)    Total Protein 8.7 (*)    Albumin 2.7 (*)    Total Bilirubin 1.4 (*)    GFR calc non Af Amer 11 (*)    GFR calc Af Amer 13 (*)    All other components within normal limits  CBC WITH DIFFERENTIAL/PLATELET - Abnormal; Notable for the following components:   WBC 15.7 (*)    RBC 4.19 (*)    Hemoglobin 12.5 (*)    HCT 37.0 (*)    Neutro Abs 11.7 (*)    Monocytes Absolute 1.8 (*)    Abs Immature Granulocytes 0.11 (*)    All other components within normal limits  SARS CORONAVIRUS 2 (TAT 6-24 HRS)  CULTURE, BLOOD (ROUTINE X 2)  CULTURE, BLOOD (ROUTINE X 2)  LACTIC ACID, PLASMA  LACTIC ACID, PLASMA    EKG None  Radiology Dg Chest 2 View  Result Date: 12/31/2018 CLINICAL DATA:  Sore throat, runny nose, cough and chills. EXAM: CHEST - 2 VIEW COMPARISON:  05/13/2013 FINDINGS: The heart size and mediastinal contours are within normal limits. Superimposed on chronic lung disease is some increased bronchial and interstitial prominence in the right lower lung which may be consistent with acute bronchitis and potentially early pneumonia. No pulmonary edema. The visualized skeletal structures are unremarkable. IMPRESSION: Increased bronchial and interstitial prominence in the right lower lung which may be consistent with acute bronchitis and potentially early pneumonia. Electronically Signed   By: Aletta Edouard M.D.   On: 12/31/2018 08:46    Procedures Procedures (including critical care time)  Medications Ordered in ED Medications  cefTRIAXone (ROCEPHIN) 2 g in sodium chloride 0.9 % 100 mL IVPB (has no administration in time range)  azithromycin (ZITHROMAX) 500 mg in sodium chloride 0.9 % 250 mL IVPB (has no administration in time range)  acetaminophen (TYLENOL) tablet 650 mg (650 mg Oral  Given 12/31/18 [id number])     Initial Impression / Assessment and Plan / ED Course  I have reviewed the triage vital signs and the nursing notes.  Pertinent labs & imaging results that were available during my care of the patient were reviewed by me and considered in my medical decision making (see chart for details).       CRITICAL CARE Performed by: Leota Jacobsen Total critical care time:  45 minutes Critical care time was exclusive of separately billable procedures and treating other patients. Critical care was necessary to treat or prevent imminent or life-threatening deterioration. Critical care was time spent personally by me on the following activities: development of treatment plan with patient and/or surrogate as well as nursing, discussions with consultants, evaluation of patient's response to treatment, examination of patient, obtaining history from patient or surrogate, ordering and performing treatments and interventions, ordering and review of laboratory studies, ordering and review of radiographic studies, pulse oximetry and re-evaluation of patient's condition.  Pt started on abx for likely pneumonia. Patient's kidney function is mostly unchanged from his prior value of 12 days ago.  His hyperkalemia is the same.  We will treat that with Lokelma.  We will start IV fluids. Potassium elevated but no change from prior covid pending Will admit to hospitalist  Francisco Williamson was evaluated in Emergency Department on 12/31/2018 for the symptoms described in the history of present illness. He was evaluated in the context of the global COVID-19 pandemic, which necessitated consideration that the patient might be at risk for infection with the SARS-CoV-2 virus that causes COVID-19. Institutional protocols and algorithms that pertain to the evaluation of patients at risk for COVID-19 are in a state of rapid change based on information released by regulatory bodies including the CDC and  federal and state organizations. These policies and algorithms were followed during the patient's care in the ED.   Final Clinical Impressions(s) / ED Diagnoses   Final diagnoses:  None    ED Discharge Orders    None       Lacretia Leigh, MD 12/31/18 1644    Lacretia Leigh, MD 12/31/18 1650

## 2019-01-01 ENCOUNTER — Encounter (HOSPITAL_COMMUNITY): Payer: Self-pay

## 2019-01-01 DIAGNOSIS — J1282 Pneumonia due to coronavirus disease 2019: Secondary | ICD-10-CM | POA: Diagnosis present

## 2019-01-01 DIAGNOSIS — J1289 Other viral pneumonia: Secondary | ICD-10-CM

## 2019-01-01 DIAGNOSIS — I10 Essential (primary) hypertension: Secondary | ICD-10-CM

## 2019-01-01 DIAGNOSIS — U071 COVID-19: Secondary | ICD-10-CM | POA: Diagnosis present

## 2019-01-01 DIAGNOSIS — I120 Hypertensive chronic kidney disease with stage 5 chronic kidney disease or end stage renal disease: Secondary | ICD-10-CM

## 2019-01-01 DIAGNOSIS — N185 Chronic kidney disease, stage 5: Secondary | ICD-10-CM

## 2019-01-01 LAB — COMPREHENSIVE METABOLIC PANEL
ALT: 12 U/L (ref 0–44)
AST: 33 U/L (ref 15–41)
Albumin: 2.2 g/dL — ABNORMAL LOW (ref 3.5–5.0)
Alkaline Phosphatase: 47 U/L (ref 38–126)
Anion gap: 9 (ref 5–15)
BUN: 48 mg/dL — ABNORMAL HIGH (ref 8–23)
CO2: 15 mmol/L — ABNORMAL LOW (ref 22–32)
Calcium: 7.9 mg/dL — ABNORMAL LOW (ref 8.9–10.3)
Chloride: 113 mmol/L — ABNORMAL HIGH (ref 98–111)
Creatinine, Ser: 4.3 mg/dL — ABNORMAL HIGH (ref 0.61–1.24)
GFR calc Af Amer: 15 mL/min — ABNORMAL LOW (ref >60)
GFR calc non Af Amer: 13 mL/min — ABNORMAL LOW (ref >60)
Glucose, Bld: 60 mg/dL — ABNORMAL LOW (ref 70–99)
Potassium: 4.9 mmol/L (ref 3.5–5.1)
Sodium: 137 mmol/L (ref 135–145)
Total Bilirubin: 1.7 mg/dL — ABNORMAL HIGH (ref 0.3–1.2)
Total Protein: 7.3 g/dL (ref 6.5–8.1)

## 2019-01-01 LAB — CBC
HCT: 36.8 % — ABNORMAL LOW (ref 39.0–52.0)
Hemoglobin: 12.5 g/dL — ABNORMAL LOW (ref 13.0–17.0)
MCH: 30 pg (ref 26.0–34.0)
MCHC: 34 g/dL (ref 30.0–36.0)
MCV: 88.2 fL (ref 80.0–100.0)
Platelets: 135 10*3/uL — ABNORMAL LOW (ref 150–400)
RBC: 4.17 MIL/uL — ABNORMAL LOW (ref 4.22–5.81)
RDW: 16.7 % — ABNORMAL HIGH (ref 11.5–15.5)
WBC: 17.9 10*3/uL — ABNORMAL HIGH (ref 4.0–10.5)
nRBC: 0 % (ref 0.0–0.2)

## 2019-01-01 LAB — URINALYSIS, ROUTINE W REFLEX MICROSCOPIC
Bilirubin Urine: NEGATIVE
Glucose, UA: NEGATIVE mg/dL
Ketones, ur: 5 mg/dL — AB
Leukocytes,Ua: NEGATIVE
Nitrite: NEGATIVE
Protein, ur: 100 mg/dL — AB
Specific Gravity, Urine: 1.011 (ref 1.005–1.030)
pH: 7 (ref 5.0–8.0)

## 2019-01-01 LAB — C-REACTIVE PROTEIN: CRP: 20.7 mg/dL — ABNORMAL HIGH (ref <1.0)

## 2019-01-01 LAB — FERRITIN: Ferritin: 1172 ng/mL — ABNORMAL HIGH (ref 24–336)

## 2019-01-01 LAB — TROPONIN I (HIGH SENSITIVITY)
Troponin I (High Sensitivity): 90 ng/L — ABNORMAL HIGH (ref <18)
Troponin I (High Sensitivity): 97 ng/L — ABNORMAL HIGH (ref <18)

## 2019-01-01 LAB — LACTATE DEHYDROGENASE: LDH: 199 U/L — ABNORMAL HIGH (ref 98–192)

## 2019-01-01 LAB — D-DIMER, QUANTITATIVE: D-Dimer, Quant: 1.3 ug/mL-FEU — ABNORMAL HIGH (ref 0.00–0.50)

## 2019-01-01 LAB — TYPE AND SCREEN
ABO/RH(D): O POS
Antibody Screen: NEGATIVE

## 2019-01-01 LAB — ABO/RH: ABO/RH(D): O POS

## 2019-01-01 LAB — HEPATITIS B SURFACE ANTIGEN: Hepatitis B Surface Ag: NONREACTIVE

## 2019-01-01 LAB — PROCALCITONIN: Procalcitonin: 38.47 ng/mL

## 2019-01-01 LAB — SARS CORONAVIRUS 2 (TAT 6-24 HRS): SARS Coronavirus 2: POSITIVE — AB

## 2019-01-01 LAB — FIBRINOGEN: Fibrinogen: 600 mg/dL — ABNORMAL HIGH (ref 210–475)

## 2019-01-01 LAB — BRAIN NATRIURETIC PEPTIDE: B Natriuretic Peptide: 154.8 pg/mL — ABNORMAL HIGH (ref 0.0–100.0)

## 2019-01-01 MED ORDER — GUAIFENESIN-DM 100-10 MG/5ML PO SYRP: 10.0000 mL | ORAL_SOLUTION | ORAL | Status: DC | PRN

## 2019-01-01 MED ORDER — SODIUM CHLORIDE 0.9 % IV SOLN
200.0000 mg | Freq: Once | INTRAVENOUS | Status: AC
  Administered 2019-01-01: 200 mg via INTRAVENOUS
  Filled 2019-01-01: qty 40

## 2019-01-01 MED ORDER — ZINC SULFATE 220 (50 ZN) MG PO CAPS
220.0000 mg | ORAL_CAPSULE | Freq: Every day | ORAL | Status: DC
  Administered 2019-01-01 – 2019-01-06 (×6): 220 mg via ORAL
  Filled 2019-01-01 (×6): qty 1

## 2019-01-01 MED ORDER — SODIUM CHLORIDE 0.9% FLUSH: 3.0000 mL | Freq: Two times a day (BID) | INTRAVENOUS | Status: DC

## 2019-01-01 MED ORDER — ACETAMINOPHEN 325 MG PO TABS
325.0000 mg | ORAL_TABLET | Freq: Once | ORAL | Status: AC
  Administered 2019-01-01: 325 mg via ORAL
  Filled 2019-01-01: qty 1

## 2019-01-01 MED ORDER — ACETAMINOPHEN 500 MG PO TABS: 1000.0000 mg | ORAL_TABLET | Freq: Four times a day (QID) | ORAL | Status: DC

## 2019-01-01 MED ORDER — TACROLIMUS 1 MG PO CAPS
4.0000 mg | ORAL_CAPSULE | Freq: Two times a day (BID) | ORAL | Status: DC
  Administered 2019-01-01 – 2019-01-06 (×10): 4 mg via ORAL
  Filled 2019-01-01 (×14): qty 4

## 2019-01-01 MED ORDER — IPRATROPIUM-ALBUTEROL 20-100 MCG/ACT IN AERS
1.0000 | INHALATION_SPRAY | Freq: Four times a day (QID) | RESPIRATORY_TRACT | Status: DC
  Administered 2019-01-01 – 2019-01-06 (×18): 1 via RESPIRATORY_TRACT
  Filled 2019-01-01 (×2): qty 4

## 2019-01-01 MED ORDER — HEPARIN SODIUM (PORCINE) 5000 UNIT/ML IJ SOLN: 5000.0000 [IU] | Freq: Three times a day (TID) | INTRAMUSCULAR | Status: DC

## 2019-01-01 MED ORDER — DEXAMETHASONE SODIUM PHOSPHATE 10 MG/ML IJ SOLN
10.0000 mg | Freq: Once | INTRAMUSCULAR | Status: AC
  Administered 2019-01-01: 10 mg via INTRAVENOUS
  Filled 2019-01-01: qty 1

## 2019-01-01 MED ORDER — HYDRALAZINE HCL 20 MG/ML IJ SOLN
10.0000 mg | Freq: Once | INTRAMUSCULAR | Status: AC
  Administered 2019-01-01: 10 mg via INTRAVENOUS
  Filled 2019-01-01: qty 1

## 2019-01-01 MED ORDER — DEXAMETHASONE SODIUM PHOSPHATE 10 MG/ML IJ SOLN
6.0000 mg | INTRAMUSCULAR | Status: DC
  Administered 2019-01-02: 6 mg via INTRAVENOUS
  Filled 2019-01-01: qty 1

## 2019-01-01 MED ORDER — VITAMIN C 500 MG PO TABS
500.0000 mg | ORAL_TABLET | Freq: Every day | ORAL | Status: DC
  Administered 2019-01-01 – 2019-01-06 (×6): 500 mg via ORAL
  Filled 2019-01-01 (×6): qty 1

## 2019-01-01 MED ORDER — TRAZODONE HCL 50 MG PO TABS
25.0000 mg | ORAL_TABLET | Freq: Every evening | ORAL | Status: DC | PRN
  Filled 2019-01-01: qty 1

## 2019-01-01 MED ORDER — HYDROCOD POLST-CPM POLST ER 10-8 MG/5ML PO SUER: 5.0000 mL | Freq: Two times a day (BID) | ORAL | Status: DC | PRN

## 2019-01-01 MED ORDER — ACETAMINOPHEN 325 MG PO TABS: 650.0000 mg | ORAL_TABLET | Freq: Four times a day (QID) | ORAL | Status: DC | PRN

## 2019-01-01 MED ORDER — SENNOSIDES-DOCUSATE SODIUM 8.6-50 MG PO TABS: 1.0000 | ORAL_TABLET | Freq: Every evening | ORAL | Status: DC | PRN

## 2019-01-01 MED ORDER — SODIUM CHLORIDE 0.9 % IV SOLN
100.0000 mg | INTRAVENOUS | Status: AC
  Administered 2019-01-02 – 2019-01-05 (×4): 100 mg via INTRAVENOUS
  Filled 2019-01-01 (×4): qty 20

## 2019-01-01 MED ORDER — ACETAMINOPHEN 500 MG PO TABS
1000.0000 mg | ORAL_TABLET | Freq: Four times a day (QID) | ORAL | Status: DC
  Administered 2019-01-01: 1000 mg via ORAL
  Filled 2019-01-01: qty 2

## 2019-01-01 MED ORDER — HYDROCODONE-ACETAMINOPHEN 5-325 MG PO TABS: 1.0000 | ORAL_TABLET | ORAL | Status: DC | PRN

## 2019-01-01 NOTE — ED Notes (Signed)
Called [facility] for pt transport to [facility]

## 2019-01-01 NOTE — Progress Notes (Signed)
PROGRESS NOTE  Francisco Williamson [id number] DOB: Jul 10, 1945 DOA: 12/31/2018 PCP: Default, Provider, MD  HPI/Recap of past 24 hours: HPI from Dr Sabra Heck is a 73 y.o. male with medical history significant of history of kidney transplant in 2002, CKD stage V, who presents after a recent trip to [place]. 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 No known sick contacts to his knowledge.  He states that he has had some subjective fever, chills, congestion, dry cough. His girlfriend of 40 years whom he resides with his at bedside.  She also traveled with him over the weekend and is currently feeling well without any symptoms. In the ED, labs revealed Cr 4.79, K 5.6, WBC 15.7. CXR showed increased bronchial and interstitial prominence RLL consistent with acute bronchitis or potentially early pneumonia. Patient started on rocephin/azithromycin. Asked EDP to start IVF as well as treatment for hyperkalemia. COVID was pending. Patient was admitted under PUI.    Overnight, patient result came back positive for Covid.  Today, patient still noted to be febrile, with chills, dry cough.  Patient denies any shortness of breath, chest pain, nausea/vomiting, abdominal pain, diarrhea.  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)   Hyperkalemia   Pneumonia due to COVID-19 virus  Pneumonia due to COVID-19 virus/CAP Saturating well on room air Last temp spike of 102.9 earlier this a.m., with leukocytosis (on home steroids) Inflammatory markers elevated, ferritin 1172, CRP 20.7, procalcitonin 38.47 (value may be affected due to CKD stage V) Troponin 90-->97, flat trend likely due to CKD stage V (denies chest pain) BNP 154.8, LDH 199 BC x2 pending UA with no signs of infection Chest  x-ray showed increased bronchial and interstitial prominence in the right lower lung which may be consistent with acute bronchitis and potentially early pneumonia IV Decadron 10 mg, remdesivir was started, continue Due to elevated procalcitonin, will continue IV azithromycin, ceftriaxone to cover for ??CAP Monitor inflammatory markers Transfer to [place] for further management  CKD stage V/metabolic acidosis History of renal transplant in 2002 Baseline creatinine around 4, currently around baseline Spoke to Dr. Joelyn Oms, okay with transfer to [place] as currently no indication for any further renal intervention Continue tacrolimus, hold home prednisone as patient is currently on Decadron Daily CMP  Mildly elevated troponin Flat trend, denies chest pain Will order EKG As mentioned above, in the setting of CKD stage V  Hypertension Uncontrolled Continue labetalol, Norvasc        Malnutrition Type:      Malnutrition Characteristics:      Nutrition Interventions:       Estimated body mass index is 20.8 kg/m as calculated from the following:   Height as of 04/08/15: 5\' 5"  (1.651 m).   Weight as of 04/08/15: 56.7 kg.     Code Status: DNR  Family Communication: None at bedside  Disposition Plan: Transfer to [facility]   Consultants:  None  Procedures:  None  Antimicrobials:  Ceftriaxone  Azithromycin  DVT prophylaxis: Heparin   Objective: Vitals:   01/01/19 1024 01/01/19 1030 01/01/19 1300 01/01/19 1330  BP: (!) 152/74 (!) 179/82 (!) 143/79 (!) 169/65  Pulse: (!) 101  93 89  Resp:  (!) 32 (!) 25 (!) 22  Temp:      TempSrc:  SpO2:   96% 97%    Intake/Output Summary (Last 24 hours) at 01/01/2019 1430 Last data filed at 01/01/2019 1310 Gross per 24 hour  Intake 1988.92 ml  Output 850 ml  Net 1138.92 ml   There were no vitals filed for this visit.  Exam:  General: NAD, chronically ill-appearing  Cardiovascular: S1,  S2 present  Respiratory: CTAB, diminished breath sound at the bases  Abdomen: Soft, nontender, nondistended, bowel sounds present  Musculoskeletal: No bilateral pedal edema noted  Skin: Normal  Psychiatry: Normal mood   Data Reviewed: CBC: Recent Labs  Lab 12/31/18 0821 01/01/19 0512  WBC 15.7* 17.9*  NEUTROABS 11.7*  --   HGB 12.5* 12.5*  HCT 37.0* 36.8*  MCV 88.3 88.2  PLT 166 [id number]*   Basic Metabolic Panel: Recent Labs  Lab 12/31/18 0821 01/01/19 0826  NA 135 137  K 5.6* 4.9  CL 110 113*  CO2 19* 15*  GLUCOSE 94 60*  BUN 45* 48*  CREATININE 4.79* 4.30*  CALCIUM 8.4* 7.9*   GFR: CrCl cannot be calculated (Unknown ideal weight.). Liver Function Tests: Recent Labs  Lab 12/31/18 0821 01/01/19 0826  AST 27 33  ALT 15 12  ALKPHOS 60 47  BILITOT 1.4* 1.7*  PROT 8.7* 7.3  ALBUMIN 2.7* 2.2*   No results for input(s): LIPASE, AMYLASE in the last 168 hours. No results for input(s): AMMONIA in the last 168 hours. Coagulation Profile: No results for input(s): INR, PROTIME in the last 168 hours. Cardiac Enzymes: No results for input(s): CKTOTAL, CKMB, CKMBINDEX, TROPONINI in the last 168 hours. BNP (last 3 results) No results for input(s): PROBNP in the last 8760 hours. HbA1C: No results for input(s): HGBA1C in the last 72 hours. CBG: No results for input(s): GLUCAP in the last 168 hours. Lipid Profile: No results for input(s): CHOL, HDL, LDLCALC, TRIG, CHOLHDL, LDLDIRECT in the last 72 hours. Thyroid Function Tests: No results for input(s): TSH, T4TOTAL, FREET4, T3FREE, THYROIDAB in the last 72 hours. Anemia Panel: Recent Labs    01/01/19 0826  FERRITIN 1,172*   Urine analysis:    Component Value Date/Time   COLORURINE YELLOW 01/01/2019 0512   APPEARANCEUR HAZY (A) 01/01/2019 0512   LABSPEC 1.011 01/01/2019 0512   PHURINE 7.0 01/01/2019 0512   GLUCOSEU NEGATIVE 01/01/2019 0512   HGBUR SMALL (A) 01/01/2019 0512   BILIRUBINUR NEGATIVE 01/01/2019  0512   KETONESUR 5 (A) 01/01/2019 0512   PROTEINUR 100 (A) 01/01/2019 0512   UROBILINOGEN 1.0 12/20/2014 1252   NITRITE NEGATIVE 01/01/2019 0512   LEUKOCYTESUR NEGATIVE 01/01/2019 0512   Sepsis Labs: @LABRCNTIP (procalcitonin:4,lacticidven:4)  ) Recent Results (from the past 240 hour(s))  Culture, blood (Routine X 2) w Reflex to ID Panel     Status: None (Preliminary result)   Collection Time: 12/31/18  5:09 PM   Specimen: BLOOD  Result Value Ref Range Status   Specimen Description BLOOD RIGHT ANTECUBITAL  Final   Special Requests   Final    BOTTLES DRAWN AEROBIC AND ANAEROBIC Blood Culture results may not be optimal due to an inadequate volume of blood received in culture bottles   Culture   Final    NO GROWTH < 24 HOURS Performed at [facility], [place] [address], [place], [place] [address]    Report Status PENDING  Incomplete  Culture, blood (Routine X 2) w Reflex to ID Panel     Status: None (Preliminary result)   Collection Time: 12/31/18  5:11 PM   Specimen: BLOOD  RIGHT FOREARM  Result Value Ref Range Status   Specimen Description BLOOD RIGHT FOREARM  Final   Special Requests   Final    BOTTLES DRAWN AEROBIC ONLY Blood Culture results may not be optimal due to an inadequate volume of blood received in culture bottles   Culture   Final    NO GROWTH < 24 HOURS Performed at [facility], [address] [place], [place] [address]    Report Status PENDING  Incomplete  SARS CORONAVIRUS 2 (TAT 6-24 HRS) Nasopharyngeal Nasopharyngeal Swab     Status: Abnormal   Collection Time: 12/31/18  6:36 PM   Specimen: Nasopharyngeal Swab  Result Value Ref Range Status   SARS Coronavirus 2 POSITIVE (A) NEGATIVE Final    Comment: RESULT CALLED TO, READ BACK BY AND VERIFIED WITH: P JOHNSTON,RN 0058 01/01/2019 D BRADLEY (NOTE) SARS-CoV-2 target nucleic acids are DETECTED. The SARS-CoV-2 RNA is generally detectable in upper and lower respiratory specimens during the acute phase  of infection. Positive results are indicative of active infection with SARS-CoV-2. Clinical  correlation with patient history and other diagnostic information is necessary to determine patient infection status. Positive results do  not rule out bacterial infection or co-infection with other viruses. The expected result is Negative. Fact Sheet for Patients: SugarRoll.be Fact Sheet for Healthcare Providers: https://www.woods-mathews.com/ This test is not yet approved or cleared by the [place] FDA and  has been authorized for detection and/or diagnosis of SARS-CoV-2 by FDA under an Emergency Use Authorization (EUA). This EUA will remain  in effect (meaning this test can be used) f or the duration of the COVID-19 declaration under Section 564(b)(1) of the Act, 21 U.S.C. section 360bbb-3(b)(1), unless the authorization is terminated or revoked sooner. Performed at [facility], [place] [address] [place], [place] [address]       Studies: No results found.  Scheduled Meds: . amLODipine  10 mg Oral Daily  . [START ON 01/02/2019] dexamethasone (DECADRON) injection  6 mg Intravenous Q24H  . heparin  5,000 Units Subcutaneous Q8H  . Ipratropium-Albuterol  1 puff Inhalation Q6H  . labetalol  300 mg Oral BID  . sodium chloride flush  3 mL Intravenous Q12H  . sodium chloride flush  3 mL Intravenous Q12H  . tacrolimus  4 mg Oral BID  . vitamin C  500 mg Oral Daily  . zinc sulfate  220 mg Oral Daily    Continuous Infusions: . sodium chloride    . azithromycin Stopped (12/31/18 2228)  . cefTRIAXone (ROCEPHIN)  IV    . [START ON 01/02/2019] remdesivir 100 mg in NS 250 mL       LOS: 1 day     Alma Friendly, MD [facility]  If 7PM-7AM, please contact night-coverage [email or web address] 01/01/2019, 2:30 PM

## 2019-01-01 NOTE — ED Notes (Signed)
Lunch Tray Ordered @ 1053.  

## 2019-01-01 NOTE — ED Notes (Signed)
Tele   Breakfast ordered  

## 2019-01-01 NOTE — Progress Notes (Signed)
Pharmacy Note - Remdesivir Consult  Francisco Williamson is a 73 y.o. male admitted on 12/31/2018 with COVID-19.  Pharmacy has been consulted for Remdesivir dosing.  COVID test is positive.  LFTs are within normal limits (ALT 15).  10/28 CXR: Increased bronchial and interstitial prominence in the right lower lung which may be consistent with acute bronchitis and potentially early pneumonia.  Currently receiving Ceftriaxone and Azithromycin for pneumonia.  Plan: Remdesivir 200mg  IV x1 then 100mg  IV daily x4 days.  Monitor LFTs and clinical status.      Temp (24hrs), Avg:100.2 F (37.9 C), Min:98.6 F (37 C), Max:102.9 F (39.4 C)  Recent Labs  Lab 12/31/18 0821 12/31/18 1707 01/01/19 0512  WBC 15.7*  --  17.9*  CREATININE 4.79*  --   --   LATICACIDVEN 1.0 1.7  --     CrCl cannot be calculated (Unknown ideal weight.).    No Known Allergies  Thank you for allowing pharmacy to be a part of this patient's care.  Brain Hilts 01/01/2019 8:55 AM

## 2019-01-02 DIAGNOSIS — N179 Acute kidney failure, unspecified: Secondary | ICD-10-CM

## 2019-01-02 LAB — PHOSPHORUS: Phosphorus: 4 mg/dL (ref 2.5–4.6)

## 2019-01-02 LAB — CBC WITH DIFFERENTIAL/PLATELET
Abs Immature Granulocytes: 0.21 10*3/uL — ABNORMAL HIGH (ref 0.00–0.07)
Basophils Absolute: 0 10*3/uL (ref 0.0–0.1)
Basophils Relative: 0 %
Eosinophils Absolute: 0 10*3/uL (ref 0.0–0.5)
Eosinophils Relative: 0 %
HCT: 31.7 % — ABNORMAL LOW (ref 39.0–52.0)
Hemoglobin: 10.8 g/dL — ABNORMAL LOW (ref 13.0–17.0)
Immature Granulocytes: 1 %
Lymphocytes Relative: 6 %
Lymphs Abs: 1.3 10*3/uL (ref 0.7–4.0)
MCH: 29.5 pg (ref 26.0–34.0)
MCHC: 34.1 g/dL (ref 30.0–36.0)
MCV: 86.6 fL (ref 80.0–100.0)
Monocytes Absolute: 0.6 10*3/uL (ref 0.1–1.0)
Monocytes Relative: 3 %
Neutro Abs: 20.6 10*3/uL — ABNORMAL HIGH (ref 1.7–7.7)
Neutrophils Relative %: 90 %
Platelets: 122 10*3/uL — ABNORMAL LOW (ref 150–400)
RBC: 3.66 MIL/uL — ABNORMAL LOW (ref 4.22–5.81)
RDW: 15.7 % — ABNORMAL HIGH (ref 11.5–15.5)
WBC: 22.7 10*3/uL — ABNORMAL HIGH (ref 4.0–10.5)
nRBC: 0 % (ref 0.0–0.2)

## 2019-01-02 LAB — COMPREHENSIVE METABOLIC PANEL
ALT: 18 U/L (ref 0–44)
AST: 57 U/L — ABNORMAL HIGH (ref 15–41)
Albumin: 2.3 g/dL — ABNORMAL LOW (ref 3.5–5.0)
Alkaline Phosphatase: 44 U/L (ref 38–126)
Anion gap: 9 (ref 5–15)
BUN: 63 mg/dL — ABNORMAL HIGH (ref 8–23)
CO2: 15 mmol/L — ABNORMAL LOW (ref 22–32)
Calcium: 7.7 mg/dL — ABNORMAL LOW (ref 8.9–10.3)
Chloride: 113 mmol/L — ABNORMAL HIGH (ref 98–111)
Creatinine, Ser: 4.81 mg/dL — ABNORMAL HIGH (ref 0.61–1.24)
GFR calc Af Amer: 13 mL/min — ABNORMAL LOW (ref >60)
GFR calc non Af Amer: 11 mL/min — ABNORMAL LOW (ref >60)
Glucose, Bld: 106 mg/dL — ABNORMAL HIGH (ref 70–99)
Potassium: 4.3 mmol/L (ref 3.5–5.1)
Sodium: 137 mmol/L (ref 135–145)
Total Bilirubin: 0.9 mg/dL (ref 0.3–1.2)
Total Protein: 7.8 g/dL (ref 6.5–8.1)

## 2019-01-02 LAB — SAMPLE TO BLOOD BANK

## 2019-01-02 LAB — C-REACTIVE PROTEIN: CRP: 28.3 mg/dL — ABNORMAL HIGH (ref <1.0)

## 2019-01-02 LAB — D-DIMER, QUANTITATIVE: D-Dimer, Quant: 1.78 ug/mL-FEU — ABNORMAL HIGH (ref 0.00–0.50)

## 2019-01-02 LAB — MAGNESIUM: Magnesium: 1.5 mg/dL — ABNORMAL LOW (ref 1.7–2.4)

## 2019-01-02 LAB — FERRITIN: Ferritin: 1833 ng/mL — ABNORMAL HIGH (ref 24–336)

## 2019-01-02 MED ORDER — SODIUM BICARBONATE 650 MG PO TABS
1300.0000 mg | ORAL_TABLET | Freq: Two times a day (BID) | ORAL | Status: DC
  Administered 2019-01-02 – 2019-01-05 (×7): 1300 mg via ORAL
  Filled 2019-01-02 (×9): qty 2

## 2019-01-02 MED ORDER — LACTATED RINGERS IV SOLN
INTRAVENOUS | Status: AC
  Administered 2019-01-02: 14:00:00 via INTRAVENOUS

## 2019-01-02 MED ORDER — MAGNESIUM SULFATE 2 GM/50ML IV SOLN
2.0000 g | Freq: Once | INTRAVENOUS | Status: AC
  Administered 2019-01-02: 2 g via INTRAVENOUS
  Filled 2019-01-02: qty 50

## 2019-01-02 MED ORDER — AZITHROMYCIN 250 MG PO TABS
500.0000 mg | ORAL_TABLET | Freq: Every day | ORAL | Status: DC
  Administered 2019-01-02 – 2019-01-05 (×4): 500 mg via ORAL
  Filled 2019-01-02 (×4): qty 2

## 2019-01-02 MED ORDER — METHYLPREDNISOLONE SODIUM SUCC 40 MG IJ SOLR
40.0000 mg | Freq: Two times a day (BID) | INTRAMUSCULAR | Status: DC
  Administered 2019-01-02 – 2019-01-05 (×7): 40 mg via INTRAVENOUS
  Filled 2019-01-02 (×7): qty 1

## 2019-01-02 NOTE — Progress Notes (Signed)
PROGRESS NOTE                                                                                                                                                                                                             Patient Demographics:    Francisco Williamson, is a 73 y.o. male, DOB - 14-Apr-1945, [id number]  Outpatient Primary MD for the patient is Default, Provider, MD   Admit date - 12/31/2018   LOS - 2  Chief Complaint  Patient presents with  . URI       Brief Narrative: Patient is a 73 y.o. male with PMHx of Renal transplant with CKD stage 5,HTN presented with approximately one week hx of cough,fever and mild SOB-found to have PNA-he was also found to be + for COVID-19. See below for further details.   Subjective:    Francisco Williamson today feels better-some cough continues. Not on O2. Afebrile. Does complain of some loose stools yesterday   Assessment  & Plan :    Covid 19 Viral pneumonia with superimposed bacterial [id number] improved-on room air-afebrile-but leukocytosis worsening (but on steroids). Blood cultures negative.CRP remains elevated and worsening. Plan is to continue steroids/remdesivir and empiric rocephin/zithromax.  Fever: afebrile O2 requirements: On RA  COVID-19 Labs: Recent Labs    01/01/19 0826 01/02/19 0600  DDIMER 1.30* 1.78*  FERRITIN 1,172* 1,833*  LDH 199*  --   CRP 20.7* 28.3*    Lab Results  Component Value Date   SARSCOV2NAA POSITIVE (A) 12/31/2018     COVID-19 Medications: Steroids:10/29>> Remdesivir:10/29>> Actemra:Not indicated Convalescent Plasma:not indicated for now Research Studies:N/A  Other medications: Diuretics:Euvolemic-no need for lasix Antibiotics:10/28>>Rocephin/Zithromax  Prone/Incentive Spirometry: encouragede incentive spirometry use 3-4/hour.  DVT Prophylaxis  : Heparin   AKI on CKD stage V-renal transplantation: AKI likely hemodynamically  mediated in the setting of pneumonia and mild diarrhea- will start gentle hydration with IVF.  Continue immunosuppressive's.  Metabolic acidosis: Suspect secondary to AKI/CKD-start sodium bicarbonate supplementation.  HTN: Controlled-continue amlodipine and labetalol.  Debility/deconditioning: Secondary to acute illness.  Obtain PT/OT eval.  ABG:    Component Value Date/Time   TCO2 22 05/13/2013 1311    Vent Settings:    Condition - Stable  Family Communication  : Voicemail left for patient's significant other  Code Status :  DNR  Diet :  Diet Order  Diet renal with fluid restriction Fluid restriction: 1200 mL Fluid; Room service appropriate? Yes; Fluid consistency: Thin  Diet effective now               Disposition Plan  :  Remain hospitalized-  Barriers to discharge: Complete 5 days of IV Remdesivir  Consults  :  None  Procedures  :  None  Antibiotics  :    Anti-infectives (From admission, onward)   Start     Dose/Rate Route Frequency Ordered Stop   01/02/19 1000  remdesivir 100 mg in sodium chloride 0.9 % 250 mL IVPB     100 mg 500 mL/hr over 30 Minutes Intravenous Every 24 hours 01/01/19 0904 01/06/19 0959   01/01/19 1000  remdesivir 200 mg in sodium chloride 0.9 % 250 mL IVPB     200 mg 500 mL/hr over 30 Minutes Intravenous Once 01/01/19 0904 01/01/19 1045   12/31/18 2100  azithromycin (ZITHROMAX) 500 mg in sodium chloride 0.9 % 250 mL IVPB     500 mg 250 mL/hr over 60 Minutes Intravenous Every 24 hours 12/31/18 2050     12/31/18 2100  cefTRIAXone (ROCEPHIN) 1 g in sodium chloride 0.9 % 100 mL IVPB     1 g 200 mL/hr over 30 Minutes Intravenous Every 24 hours 12/31/18 2050     12/31/18 1645  cefTRIAXone (ROCEPHIN) 2 g in sodium chloride 0.9 % 100 mL IVPB  Status:  Discontinued     2 g 200 mL/hr over 30 Minutes Intravenous Every 24 hours 12/31/18 1633 01/01/19 1247   12/31/18 1645  azithromycin (ZITHROMAX) 500 mg in sodium chloride 0.9 % 250 mL  IVPB  Status:  Discontinued     500 mg 250 mL/hr over 60 Minutes Intravenous Every 24 hours 12/31/18 1633 01/01/19 1247      Inpatient Medications  Scheduled Meds: . amLODipine  10 mg Oral Daily  . dexamethasone (DECADRON) injection  6 mg Intravenous Q24H  . heparin  5,000 Units Subcutaneous Q8H  . Ipratropium-Albuterol  1 puff Inhalation Q6H  . labetalol  300 mg Oral BID  . sodium bicarbonate  1,300 mg Oral BID  . sodium chloride flush  3 mL Intravenous Q12H  . sodium chloride flush  3 mL Intravenous Q12H  . tacrolimus  4 mg Oral BID  . vitamin C  500 mg Oral Daily  . zinc sulfate  220 mg Oral Daily   Continuous Infusions: . sodium chloride    . azithromycin 500 mg (01/01/19 2124)  . cefTRIAXone (ROCEPHIN)  IV 1 g (01/01/19 2125)  . remdesivir 100 mg in NS 250 mL     PRN Meds:.sodium chloride, [DISCONTINUED] acetaminophen **OR** acetaminophen, acetaminophen, chlorpheniramine-HYDROcodone, guaiFENesin-dextromethorphan, HYDROcodone-acetaminophen, labetalol, ondansetron **OR** ondansetron (ZOFRAN) IV, senna-docusate, sodium chloride flush, traZODone   Time Spent in minutes  25  See all Orders from today for further details   Francisco Williamson M.D on 01/02/2019 at 9:53 AM  To page go to [email or web address] - use universal password  [facility] -  Office  [phone]    Objective:   Vitals:   01/01/19 1926 01/01/19 1940 01/02/19 0400 01/02/19 0715  BP: (!) 161/68  (!) 161/68 (!) 151/70  Pulse: 95  98 93  Resp: 20  20 20   Temp: 99 F (37.2 C)  99.1 F (37.3 C) 98.5 F (36.9 C)  TempSrc: Oral  Oral Oral  SpO2: 96%  97% 98%  Weight:  53.5 kg    Height:  5\' 5"  (1.651 m)  Wt Readings from Last 3 Encounters:  01/01/19 53.5 kg  04/08/15 56.7 kg  06/08/11 56.7 kg     Intake/Output Summary (Last 24 hours) at 01/02/2019 0953 Last data filed at 01/02/2019 0600 Gross per 24 hour  Intake 474.34 ml  Output 300 ml  Net 174.34 ml     Physical Exam Gen  Exam:Alert awake-not in any distress HEENT:atraumatic, normocephalic Chest: B/L clear to auscultation anteriorly CVS:S1S2 regular Abdomen:soft non tender, non distended Extremities:no edema Neurology: Non focal Skin: no rash   Data Review:    CBC Recent Labs  Lab 12/31/18 0821 01/01/19 0512 01/02/19 0600  WBC 15.7* 17.9* 22.7*  HGB 12.5* 12.5* 10.8*  HCT 37.0* 36.8* 31.7*  PLT 166 135* 122*  MCV 88.3 88.2 86.6  MCH 29.8 30.0 29.5  MCHC 33.8 34.0 34.1  RDW 15.5 16.7* 15.7*  LYMPHSABS 2.0  --  1.3  MONOABS 1.8*  --  0.6  EOSABS 0.1  --  0.0  BASOSABS 0.0  --  0.0    Chemistries  Recent Labs  Lab 12/31/18 0821 01/01/19 0826 01/02/19 0600  NA 135 137 137  K 5.6* 4.9 4.3  CL 110 113* 113*  CO2 19* 15* 15*  GLUCOSE 94 60* 106*  BUN 45* 48* 63*  CREATININE 4.79* 4.30* 4.81*  CALCIUM 8.4* 7.9* 7.7*  MG  --   --  1.5*  AST 27 33 57*  ALT 15 12 18   ALKPHOS 60 47 44  BILITOT 1.4* 1.7* 0.9   ------------------------------------------------------------------------------------------------------------------ No results for input(s): CHOL, HDL, LDLCALC, TRIG, CHOLHDL, LDLDIRECT in the last 72 hours.  Lab Results  Component Value Date   HGBA1C 4.6 12/01/2010   ------------------------------------------------------------------------------------------------------------------ No results for input(s): TSH, T4TOTAL, T3FREE, THYROIDAB in the last 72 hours.  Invalid input(s): FREET3 ------------------------------------------------------------------------------------------------------------------ Recent Labs    01/01/19 0826 01/02/19 0600  FERRITIN 1,172* 1,833*    Coagulation profile No results for input(s): INR, PROTIME in the last 168 hours.  Recent Labs    01/01/19 0826 01/02/19 0600  DDIMER 1.30* 1.78*    Cardiac Enzymes No results for input(s): CKMB, TROPONINI, MYOGLOBIN in the last 168 hours.  Invalid input(s): CK  ------------------------------------------------------------------------------------------------------------------    Component Value Date/Time   BNP 154.8 (H) 01/01/2019 [id number]    Micro Results Recent Results (from the past 240 hour(s))  Culture, blood (Routine X 2) w Reflex to ID Panel     Status: None (Preliminary result)   Collection Time: 12/31/18  5:09 PM   Specimen: BLOOD  Result Value Ref Range Status   Specimen Description BLOOD RIGHT ANTECUBITAL  Final   Special Requests   Final    BOTTLES DRAWN AEROBIC AND ANAEROBIC Blood Culture results may not be optimal due to an inadequate volume of blood received in culture bottles   Culture   Final    NO GROWTH 2 DAYS Performed at [facility], [place] [address], [place], [place] [address]    Report Status PENDING  Incomplete  Culture, blood (Routine X 2) w Reflex to ID Panel     Status: None (Preliminary result)   Collection Time: 12/31/18  5:11 PM   Specimen: BLOOD RIGHT FOREARM  Result Value Ref Range Status   Specimen Description BLOOD RIGHT FOREARM  Final   Special Requests   Final    BOTTLES DRAWN AEROBIC ONLY Blood Culture results may not be optimal due to an inadequate volume of blood received in culture bottles   Culture   Final  NO GROWTH 2 DAYS Performed at [facility], [place] [address], [place], [place] [address]    Report Status PENDING  Incomplete  SARS CORONAVIRUS 2 (TAT 6-24 HRS) Nasopharyngeal Nasopharyngeal Swab     Status: Abnormal   Collection Time: 12/31/18  6:36 PM   Specimen: Nasopharyngeal Swab  Result Value Ref Range Status   SARS Coronavirus 2 POSITIVE (A) NEGATIVE Final    Comment: RESULT CALLED TO, READ BACK BY AND VERIFIED WITH: P JOHNSTON,RN 0058 01/01/2019 D BRADLEY (NOTE) SARS-CoV-2 target nucleic acids are DETECTED. The SARS-CoV-2 RNA is generally detectable in upper and lower respiratory specimens during the acute phase of infection. Positive results are indicative of active  infection with SARS-CoV-2. Clinical  correlation with patient history and other diagnostic information is necessary to determine patient infection status. Positive results do  not rule out bacterial infection or co-infection with other viruses. The expected result is Negative. Fact Sheet for Patients: SugarRoll.be Fact Sheet for Healthcare Providers: https://www.woods-mathews.com/ This test is not yet approved or cleared by the [place] FDA and  has been authorized for detection and/or diagnosis of SARS-CoV-2 by FDA under an Emergency Use Authorization (EUA). This EUA will remain  in effect (meaning this test can be used) f or the duration of the COVID-19 declaration under Section 564(b)(1) of the Act, 21 U.S.C. section 360bbb-3(b)(1), unless the authorization is terminated or revoked sooner. Performed at [facility], [place] [address], [place], [place] [address]     Radiology Reports Dg Chest 2 View  Result Date: 12/31/2018 CLINICAL DATA:  Sore throat, runny nose, cough and chills. EXAM: CHEST - 2 VIEW COMPARISON:  05/13/2013 FINDINGS: The heart size and mediastinal contours are within normal limits. Superimposed on chronic lung disease is some increased bronchial and interstitial prominence in the right lower lung which may be consistent with acute bronchitis and potentially early pneumonia. No pulmonary edema. The visualized skeletal structures are unremarkable. IMPRESSION: Increased bronchial and interstitial prominence in the right lower lung which may be consistent with acute bronchitis and potentially early pneumonia. Electronically Signed   By: Aletta Edouard M.D.   On: 12/31/2018 08:46

## 2019-01-03 LAB — COMPREHENSIVE METABOLIC PANEL
ALT: 18 U/L (ref 0–44)
AST: 58 U/L — ABNORMAL HIGH (ref 15–41)
Albumin: 2.1 g/dL — ABNORMAL LOW (ref 3.5–5.0)
Alkaline Phosphatase: 45 U/L (ref 38–126)
Anion gap: 10 (ref 5–15)
BUN: 72 mg/dL — ABNORMAL HIGH (ref 8–23)
CO2: 14 mmol/L — ABNORMAL LOW (ref 22–32)
Calcium: 7.8 mg/dL — ABNORMAL LOW (ref 8.9–10.3)
Chloride: 112 mmol/L — ABNORMAL HIGH (ref 98–111)
Creatinine, Ser: 4.71 mg/dL — ABNORMAL HIGH (ref 0.61–1.24)
GFR calc Af Amer: 13 mL/min — ABNORMAL LOW (ref >60)
GFR calc non Af Amer: 12 mL/min — ABNORMAL LOW (ref >60)
Glucose, Bld: 248 mg/dL — ABNORMAL HIGH (ref 70–99)
Potassium: 4.3 mmol/L (ref 3.5–5.1)
Sodium: 136 mmol/L (ref 135–145)
Total Bilirubin: 0.5 mg/dL (ref 0.3–1.2)
Total Protein: 7.4 g/dL (ref 6.5–8.1)

## 2019-01-03 LAB — CBC WITH DIFFERENTIAL/PLATELET
Abs Immature Granulocytes: 0.14 10*3/uL — ABNORMAL HIGH (ref 0.00–0.07)
Basophils Absolute: 0 10*3/uL (ref 0.0–0.1)
Basophils Relative: 0 %
Eosinophils Absolute: 0 10*3/uL (ref 0.0–0.5)
Eosinophils Relative: 0 %
HCT: 30.5 % — ABNORMAL LOW (ref 39.0–52.0)
Hemoglobin: 10.4 g/dL — ABNORMAL LOW (ref 13.0–17.0)
Immature Granulocytes: 1 %
Lymphocytes Relative: 3 %
Lymphs Abs: 0.6 10*3/uL — ABNORMAL LOW (ref 0.7–4.0)
MCH: 29 pg (ref 26.0–34.0)
MCHC: 34.1 g/dL (ref 30.0–36.0)
MCV: 85 fL (ref 80.0–100.0)
Monocytes Absolute: 0.3 10*3/uL (ref 0.1–1.0)
Monocytes Relative: 2 %
Neutro Abs: 19.8 10*3/uL — ABNORMAL HIGH (ref 1.7–7.7)
Neutrophils Relative %: 94 %
Platelets: 140 10*3/uL — ABNORMAL LOW (ref 150–400)
RBC: 3.59 MIL/uL — ABNORMAL LOW (ref 4.22–5.81)
RDW: 15.2 % (ref 11.5–15.5)
WBC: 20.9 10*3/uL — ABNORMAL HIGH (ref 4.0–10.5)
nRBC: 0 % (ref 0.0–0.2)

## 2019-01-03 LAB — D-DIMER, QUANTITATIVE: D-Dimer, Quant: 1.44 ug/mL-FEU — ABNORMAL HIGH (ref 0.00–0.50)

## 2019-01-03 LAB — C-REACTIVE PROTEIN: CRP: 23.8 mg/dL — ABNORMAL HIGH (ref <1.0)

## 2019-01-03 LAB — MAGNESIUM: Magnesium: 2.2 mg/dL (ref 1.7–2.4)

## 2019-01-03 LAB — FERRITIN: Ferritin: 2257 ng/mL — ABNORMAL HIGH (ref 24–336)

## 2019-01-03 MED ORDER — DEXTROSE IN LACTATED RINGERS 5 % IV SOLN
INTRAVENOUS | Status: DC
  Administered 2019-01-03: 11:00:00 via INTRAVENOUS

## 2019-01-03 MED ORDER — SODIUM BICARBONATE 8.4 % IV SOLN
INTRAVENOUS | Status: DC
  Filled 2019-01-03: qty 1000

## 2019-01-03 MED ORDER — SODIUM BICARBONATE 8.4 % IV SOLN
INTRAVENOUS | Status: AC
  Administered 2019-01-03: 13:00:00 via INTRAVENOUS
  Filled 2019-01-03: qty 100

## 2019-01-03 NOTE — Progress Notes (Signed)
PROGRESS NOTE                                                                                                                                                                                                             Patient Demographics:    Francisco Williamson, is a 73 y.o. male, DOB - 1945/06/21, [id number]  Outpatient Primary MD for the patient is Default, Provider, MD   Admit date - 12/31/2018   LOS - 3  Chief Complaint  Patient presents with  . URI       Brief Narrative: Patient is a 73 y.o. male with PMHx of Renal transplant with CKD stage 5,HTN presented with approximately one week hx of cough,fever and mild SOB-found to have PNA-he was also found to be + for COVID-19. See below for further details.   Subjective:    [name] today feels better-cough is decreased-diarrhea has slowed down.  Remains on room air.   Assessment  & Plan :    Covid 19 Viral pneumonia with superimposed bacterial pneumonia: Clinically improved-afebrile-remains on room air-leukocytosis slowly improving (but patient on steroids).  Continue Rocephin/Zithromax/remdesivir and steroids.  We will plan on 5 days of Rocephin and remdesivir.  CRP slowly downtrending.  Fever: afebrile  O2 requirements: On RA  COVID-19 Labs: Recent Labs    01/01/19 0826 01/02/19 0600 01/03/19 0046  DDIMER 1.30* 1.78* 1.44*  FERRITIN 1,172* 1,833* 2,257*  LDH 199*  --   --   CRP 20.7* 28.3* 23.8*    Lab Results  Component Value Date   SARSCOV2NAA POSITIVE (A) 12/31/2018     COVID-19 Medications: Steroids:10/29>> Remdesivir:10/29>> Actemra:Not indicated Convalescent Plasma:not indicated for now Research Studies:N/A  Other medications: Diuretics:Euvolemic-no need for lasix Antibiotics:10/28>>Rocephin/Zithromax  Prone/Incentive Spirometry: encouragede incentive spirometry use 3-4/hour.  DVT Prophylaxis  : Heparin   AKI on CKD stage  V-renal transplantation: AKI likely hemodynamically mediated in the setting of pneumonia and mild diarrhea-creatinine has stabilized and starting to downtrend-we will continue with gentle hydration for a few more hours.  Remains on immunosuppressive agents.  Metabolic acidosis: Suspect secondary to AKI/CKD-continue Ringer lactate infusion and sodium bicarbonate supplementation.  HTN: Controlled-continue amlodipine and labetalol.  Debility/deconditioning: Secondary to acute illness.  Obtain PT/OT eval.  ABG:    Component Value Date/Time   TCO2 22 05/13/2013 1311    Vent Settings:  Condition - Stable  Family Communication  : Talked with patient's significant other  Code Status :  DNR  Diet :  Diet Order            Diet renal with fluid restriction Fluid restriction: 1200 mL Fluid; Room service appropriate? Yes; Fluid consistency: Thin  Diet effective now               Disposition Plan  :  Remain hospitalized-  Barriers to discharge: Complete 5 days of IV Remdesivir  Consults  :  None  Procedures  :  None  Antibiotics  :    Anti-infectives (From admission, onward)   Start     Dose/Rate Route Frequency Ordered Stop   01/02/19 1200  azithromycin (ZITHROMAX) tablet 500 mg     500 mg Oral Daily 01/02/19 1008     01/02/19 1000  remdesivir 100 mg in sodium chloride 0.9 % 250 mL IVPB     100 mg 500 mL/hr over 30 Minutes Intravenous Every 24 hours 01/01/19 0904 01/06/19 0959   01/01/19 1000  remdesivir 200 mg in sodium chloride 0.9 % 250 mL IVPB     200 mg 500 mL/hr over 30 Minutes Intravenous Once 01/01/19 0904 01/01/19 1045   12/31/18 2100  azithromycin (ZITHROMAX) 500 mg in sodium chloride 0.9 % 250 mL IVPB  Status:  Discontinued     500 mg 250 mL/hr over 60 Minutes Intravenous Every 24 hours 12/31/18 2050 01/02/19 1007   12/31/18 2100  cefTRIAXone (ROCEPHIN) 1 g in sodium chloride 0.9 % 100 mL IVPB     1 g 200 mL/hr over 30 Minutes Intravenous Every 24 hours  12/31/18 2050     12/31/18 1645  cefTRIAXone (ROCEPHIN) 2 g in sodium chloride 0.9 % 100 mL IVPB  Status:  Discontinued     2 g 200 mL/hr over 30 Minutes Intravenous Every 24 hours 12/31/18 1633 01/01/19 1247   12/31/18 1645  azithromycin (ZITHROMAX) 500 mg in sodium chloride 0.9 % 250 mL IVPB  Status:  Discontinued     500 mg 250 mL/hr over 60 Minutes Intravenous Every 24 hours 12/31/18 1633 01/01/19 1247      Inpatient Medications  Scheduled Meds: . amLODipine  10 mg Oral Daily  . azithromycin  500 mg Oral Daily  . heparin  5,000 Units Subcutaneous Q8H  . Ipratropium-Albuterol  1 puff Inhalation Q6H  . labetalol  300 mg Oral BID  . methylPREDNISolone (SOLU-MEDROL) injection  40 mg Intravenous Q12H  . sodium bicarbonate  1,300 mg Oral BID  . sodium chloride flush  3 mL Intravenous Q12H  . sodium chloride flush  3 mL Intravenous Q12H  . tacrolimus  4 mg Oral BID  . vitamin C  500 mg Oral Daily  . zinc sulfate  220 mg Oral Daily   Continuous Infusions: . sodium chloride    . cefTRIAXone (ROCEPHIN)  IV Stopped (01/02/19 2210)  . dextrose 5% lactated ringers 75 mL/hr at 01/03/19 1032  . remdesivir 100 mg in NS 250 mL 100 mg (01/03/19 0950)   PRN Meds:.sodium chloride, [DISCONTINUED] acetaminophen **OR** acetaminophen, acetaminophen, chlorpheniramine-HYDROcodone, guaiFENesin-dextromethorphan, HYDROcodone-acetaminophen, labetalol, ondansetron **OR** ondansetron (ZOFRAN) IV, senna-docusate, sodium chloride flush, traZODone   Time Spent in minutes  25  See all Orders from today for further details   Oren Binet M.D on 01/03/2019 at 11:28 AM  To page go to [email or web address] - use universal password  [facility] -  Office  [phone]    Objective:  Vitals:   01/02/19 0715 01/02/19 1700 01/02/19 2037 01/03/19 0758  BP: (!) 151/70 140/68 (!) 150/67 (!) 148/74  Pulse: 93 84 78 82  Resp: 20   18  Temp: 98.5 F (36.9 C) 98.7 F (37.1 C) 97.7 F (36.5 C) 98.9 F  (37.2 C)  TempSrc: Oral Oral Oral Oral  SpO2: 98% 95% 96% 95%  Weight:      Height:        Wt Readings from Last 3 Encounters:  01/01/19 53.5 kg  04/08/15 56.7 kg  06/08/11 56.7 kg     Intake/Output Summary (Last 24 hours) at 01/03/2019 1128 Last data filed at 01/03/2019 0800 Gross per 24 hour  Intake 260 ml  Output -  Net 260 ml     Physical Exam Gen Exam:Alert awake-not in any distress HEENT:atraumatic, normocephalic Chest: B/L clear to auscultation anteriorly CVS:S1S2 regular Abdomen:soft non tender, non distended Extremities:no edema Neurology: Non focal Skin: no rash   Data Review:    CBC Recent Labs  Lab 12/31/18 0821 01/01/19 0512 01/02/19 0600 01/03/19 0046  WBC 15.7* 17.9* 22.7* 20.9*  HGB 12.5* 12.5* 10.8* 10.4*  HCT 37.0* 36.8* 31.7* 30.5*  PLT 166 135* 122* 140*  MCV 88.3 88.2 86.6 85.0  MCH 29.8 30.0 29.5 29.0  MCHC 33.8 34.0 34.1 34.1  RDW 15.5 16.7* 15.7* 15.2  LYMPHSABS 2.0  --  1.3 0.6*  MONOABS 1.8*  --  0.6 0.3  EOSABS 0.1  --  0.0 0.0  BASOSABS 0.0  --  0.0 0.0    Chemistries  Recent Labs  Lab 12/31/18 0821 01/01/19 0826 01/02/19 0600 01/03/19 0046  NA 135 137 137 136  K 5.6* 4.9 4.3 4.3  CL 110 113* 113* 112*  CO2 19* 15* 15* 14*  GLUCOSE 94 60* 106* 248*  BUN 45* 48* 63* 72*  CREATININE 4.79* 4.30* 4.81* 4.71*  CALCIUM 8.4* 7.9* 7.7* 7.8*  MG  --   --  1.5* 2.2  AST 27 33 57* 58*  ALT 15 12 18 18   ALKPHOS 60 47 44 45  BILITOT 1.4* 1.7* 0.9 0.5   ------------------------------------------------------------------------------------------------------------------ No results for input(s): CHOL, HDL, LDLCALC, TRIG, CHOLHDL, LDLDIRECT in the last 72 hours.  Lab Results  Component Value Date   HGBA1C 4.6 12/01/2010   ------------------------------------------------------------------------------------------------------------------ No results for input(s): TSH, T4TOTAL, T3FREE, THYROIDAB in the last 72 hours.  Invalid  input(s): FREET3 ------------------------------------------------------------------------------------------------------------------ Recent Labs    01/02/19 0600 01/03/19 0046  FERRITIN 1,833* 2,257*    Coagulation profile No results for input(s): INR, PROTIME in the last 168 hours.  Recent Labs    01/02/19 0600 01/03/19 0046  DDIMER 1.78* 1.44*    Cardiac Enzymes No results for input(s): CKMB, TROPONINI, MYOGLOBIN in the last 168 hours.  Invalid input(s): CK ------------------------------------------------------------------------------------------------------------------    Component Value Date/Time   BNP 154.8 (H) 01/01/2019 [id number]    Micro Results Recent Results (from the past 240 hour(s))  Culture, blood (Routine X 2) w Reflex to ID Panel     Status: None (Preliminary result)   Collection Time: 12/31/18  5:09 PM   Specimen: BLOOD  Result Value Ref Range Status   Specimen Description BLOOD RIGHT ANTECUBITAL  Final   Special Requests   Final    BOTTLES DRAWN AEROBIC AND ANAEROBIC Blood Culture results may not be optimal due to an inadequate volume of blood received in culture bottles   Culture   Final    NO GROWTH 3 DAYS Performed at [facility], [place] [address] [place], [place] [address]    Report Status PENDING  Incomplete  Culture, blood (Routine X 2) w Reflex to ID Panel     Status: None (Preliminary result)   Collection Time: 12/31/18  5:11 PM   Specimen: BLOOD RIGHT FOREARM  Result Value Ref Range Status   Specimen Description BLOOD RIGHT FOREARM  Final   Special Requests   Final    BOTTLES DRAWN AEROBIC ONLY Blood Culture results may not be optimal due to an inadequate volume of blood received in culture bottles   Culture   Final    NO GROWTH 3 DAYS Performed at [facility], [place] [address], [place], [place] [address]    Report Status PENDING  Incomplete  SARS CORONAVIRUS 2 (TAT 6-24 HRS) Nasopharyngeal Nasopharyngeal Swab     Status: Abnormal    Collection Time: 12/31/18  6:36 PM   Specimen: Nasopharyngeal Swab  Result Value Ref Range Status   SARS Coronavirus 2 POSITIVE (A) NEGATIVE Final    Comment: RESULT CALLED TO, READ BACK BY AND VERIFIED WITH: P JOHNSTON,RN 0058 01/01/2019 D BRADLEY (NOTE) SARS-CoV-2 target nucleic acids are DETECTED. The SARS-CoV-2 RNA is generally detectable in upper and lower respiratory specimens during the acute phase of infection. Positive results are indicative of active infection with SARS-CoV-2. Clinical  correlation with patient history and other diagnostic information is necessary to determine patient infection status. Positive results do  not rule out bacterial infection or co-infection with other viruses. The expected result is Negative. Fact Sheet for Patients: SugarRoll.be Fact Sheet for Healthcare Providers: https://www.woods-mathews.com/ This test is not yet approved or cleared by the [place] FDA and  has been authorized for detection and/or diagnosis of SARS-CoV-2 by FDA under an Emergency Use Authorization (EUA). This EUA will remain  in effect (meaning this test can be used) f or the duration of the COVID-19 declaration under Section 564(b)(1) of the Act, 21 U.S.C. section 360bbb-3(b)(1), unless the authorization is terminated or revoked sooner. Performed at [facility], [place] [address] [place], [place] [address]     Radiology Reports Dg Chest 2 View  Result Date: 12/31/2018 CLINICAL DATA:  Sore throat, runny nose, cough and chills. EXAM: CHEST - 2 VIEW COMPARISON:  05/13/2013 FINDINGS: The heart size and mediastinal contours are within normal limits. Superimposed on chronic lung disease is some increased bronchial and interstitial prominence in the right lower lung which may be consistent with acute bronchitis and potentially early pneumonia. No pulmonary edema. The visualized skeletal structures are unremarkable.  IMPRESSION: Increased bronchial and interstitial prominence in the right lower lung which may be consistent with acute bronchitis and potentially early pneumonia. Electronically Signed   By: Aletta Edouard M.D.   On: 12/31/2018 08:46

## 2019-01-03 NOTE — Progress Notes (Signed)
Patient contact Francisco Williamson called for update on patient. She spoke with patient while nurse was in the room.and patient answered all her questions at this time.

## 2019-01-04 LAB — COMPREHENSIVE METABOLIC PANEL
ALT: 19 U/L (ref 0–44)
AST: 46 U/L — ABNORMAL HIGH (ref 15–41)
Albumin: 2 g/dL — ABNORMAL LOW (ref 3.5–5.0)
Alkaline Phosphatase: 52 U/L (ref 38–126)
Anion gap: 12 (ref 5–15)
BUN: 80 mg/dL — ABNORMAL HIGH (ref 8–23)
CO2: 16 mmol/L — ABNORMAL LOW (ref 22–32)
Calcium: 7.3 mg/dL — ABNORMAL LOW (ref 8.9–10.3)
Chloride: 109 mmol/L (ref 98–111)
Creatinine, Ser: 3.95 mg/dL — ABNORMAL HIGH (ref 0.61–1.24)
GFR calc Af Amer: 17 mL/min — ABNORMAL LOW (ref >60)
GFR calc non Af Amer: 14 mL/min — ABNORMAL LOW (ref >60)
Glucose, Bld: 228 mg/dL — ABNORMAL HIGH (ref 70–99)
Potassium: 3.4 mmol/L — ABNORMAL LOW (ref 3.5–5.1)
Sodium: 137 mmol/L (ref 135–145)
Total Bilirubin: 0.5 mg/dL (ref 0.3–1.2)
Total Protein: 7.1 g/dL (ref 6.5–8.1)

## 2019-01-04 LAB — FERRITIN: Ferritin: 1283 ng/mL — ABNORMAL HIGH (ref 24–336)

## 2019-01-04 LAB — CBC WITH DIFFERENTIAL/PLATELET
Abs Immature Granulocytes: 0.12 10*3/uL — ABNORMAL HIGH (ref 0.00–0.07)
Basophils Absolute: 0 10*3/uL (ref 0.0–0.1)
Basophils Relative: 0 %
Eosinophils Absolute: 0 10*3/uL (ref 0.0–0.5)
Eosinophils Relative: 0 %
HCT: 28.4 % — ABNORMAL LOW (ref 39.0–52.0)
Hemoglobin: 9.7 g/dL — ABNORMAL LOW (ref 13.0–17.0)
Immature Granulocytes: 1 %
Lymphocytes Relative: 4 %
Lymphs Abs: 0.6 10*3/uL — ABNORMAL LOW (ref 0.7–4.0)
MCH: 29 pg (ref 26.0–34.0)
MCHC: 34.2 g/dL (ref 30.0–36.0)
MCV: 84.8 fL (ref 80.0–100.0)
Monocytes Absolute: 0.5 10*3/uL (ref 0.1–1.0)
Monocytes Relative: 3 %
Neutro Abs: 15.8 10*3/uL — ABNORMAL HIGH (ref 1.7–7.7)
Neutrophils Relative %: 92 %
Platelets: 138 10*3/uL — ABNORMAL LOW (ref 150–400)
RBC: 3.35 MIL/uL — ABNORMAL LOW (ref 4.22–5.81)
RDW: 15.7 % — ABNORMAL HIGH (ref 11.5–15.5)
WBC: 17.1 10*3/uL — ABNORMAL HIGH (ref 4.0–10.5)
nRBC: 0.1 % (ref 0.0–0.2)

## 2019-01-04 LAB — MAGNESIUM: Magnesium: 1.8 mg/dL (ref 1.7–2.4)

## 2019-01-04 LAB — D-DIMER, QUANTITATIVE: D-Dimer, Quant: 0.88 ug/mL-FEU — ABNORMAL HIGH (ref 0.00–0.50)

## 2019-01-04 LAB — C-REACTIVE PROTEIN: CRP: 17.3 mg/dL — ABNORMAL HIGH (ref <1.0)

## 2019-01-04 MED ORDER — MAGNESIUM SULFATE 2 GM/50ML IV SOLN
2.0000 g | Freq: Once | INTRAVENOUS | Status: AC
  Administered 2019-01-04: 2 g via INTRAVENOUS
  Filled 2019-01-04: qty 50

## 2019-01-04 MED ORDER — POTASSIUM CHLORIDE CRYS ER 20 MEQ PO TBCR
40.0000 meq | EXTENDED_RELEASE_TABLET | Freq: Once | ORAL | Status: AC
  Administered 2019-01-04: 40 meq via ORAL
  Filled 2019-01-04: qty 2

## 2019-01-04 NOTE — Progress Notes (Signed)
PROGRESS NOTE                                                                                                                                                                                                             Patient Demographics:    Francisco Williamson, is a 73 y.o. male, DOB - [birth year], [id number]  Outpatient Primary MD for the patient is Default, Provider, MD   Admit date - 12/31/2018   LOS - 4  Chief Complaint  Patient presents with  . URI       Brief Narrative: Patient is a 73 y.o. male with PMHx of Renal transplant with CKD stage 5,HTN presented with approximately one week hx of cough,fever and mild SOB-found to have PNA-he was also found to be + for COVID-19. See below for further details.   Subjective:    Francisco Williamson today much better-diarrhea has slowed down-remains on room air.  No chest pain or shortness of breath.   Assessment  & Plan :    Covid 19 Viral pneumonia with superimposed bacterial pneumonia: Clinical improvement continues-remains on room air, leukocytosis is slowly downtrending.  Continue 5 days of Rocephin/Zithromax and remdesivir.  Continue IV Solu-Medrol.  CRP is slowly downtrending  Fever: afebrile  O2 requirements: On RA  COVID-19 Labs: Recent Labs    01/02/19 0600 01/03/19 0046 01/04/19 0040  DDIMER 1.78* 1.44* 0.88*  FERRITIN 1,833* 2,257* 1,283*  CRP 28.3* 23.8* 17.3*    Lab Results  Component Value Date   SARSCOV2NAA POSITIVE (A) 12/31/2018     COVID-19 Medications: Steroids:10/29>> Remdesivir:10/29>> Actemra:Not indicated Convalescent Plasma:not indicated for now Research Studies:N/A  Other medications: Diuretics:Euvolemic-no need for lasix Antibiotics:10/28>>Rocephin/Zithromax  Prone/Incentive Spirometry: encouragede incentive spirometry use 3-4/hour.  DVT Prophylaxis  : Heparin   AKI on CKD stage V-renal transplantation: AKI likely  hemodynamically mediated in the setting of pneumonia and mild diarrhea-creatinine is much better and now back to baseline.  Continue steroids and tacrolimus.   Metabolic acidosis: Suspect secondary to AKI/CKD-improved with sodium bicarb supplementation.  Marland Kitchen  HTN: BP somewhat elevated but fluctuating-continue amlodipine and labetalol-watch for 1 more day before adjusting.  Debility/deconditioning: Secondary to acute illness.  Obtain PT/OT eval.  ABG:    Component Value Date/Time   TCO2 22 05/13/2013 1311    Vent Settings:    Condition - Stable  Family  Communication  : We will update family later  Code Status :  DNR  Diet :  Diet Order            Diet renal with fluid restriction Fluid restriction: 1200 mL Fluid; Room service appropriate? Yes; Fluid consistency: Thin  Diet effective now               Disposition Plan  :  Remain hospitalized-  Barriers to discharge: Complete 5 days of IV Remdesivir  Consults  :  None  Procedures  :  None  Antibiotics  :    Anti-infectives (From admission, onward)   Start     Dose/Rate Route Frequency Ordered Stop   01/02/19 1200  azithromycin (ZITHROMAX) tablet 500 mg     500 mg Oral Daily 01/02/19 1008     01/02/19 1000  remdesivir 100 mg in sodium chloride 0.9 % 250 mL IVPB     100 mg 500 mL/hr over 30 Minutes Intravenous Every 24 hours 01/01/19 0904 01/06/19 0959   01/01/19 1000  remdesivir 200 mg in sodium chloride 0.9 % 250 mL IVPB     200 mg 500 mL/hr over 30 Minutes Intravenous Once 01/01/19 0904 01/01/19 1045   12/31/18 2100  azithromycin (ZITHROMAX) 500 mg in sodium chloride 0.9 % 250 mL IVPB  Status:  Discontinued     500 mg 250 mL/hr over 60 Minutes Intravenous Every 24 hours 12/31/18 2050 01/02/19 1007   12/31/18 2100  cefTRIAXone (ROCEPHIN) 1 g in sodium chloride 0.9 % 100 mL IVPB     1 g 200 mL/hr over 30 Minutes Intravenous Every 24 hours 12/31/18 2050     12/31/18 1645  cefTRIAXone (ROCEPHIN) 2 g in sodium  chloride 0.9 % 100 mL IVPB  Status:  Discontinued     2 g 200 mL/hr over 30 Minutes Intravenous Every 24 hours 12/31/18 1633 01/01/19 1247   12/31/18 1645  azithromycin (ZITHROMAX) 500 mg in sodium chloride 0.9 % 250 mL IVPB  Status:  Discontinued     500 mg 250 mL/hr over 60 Minutes Intravenous Every 24 hours 12/31/18 1633 01/01/19 1247      Inpatient Medications  Scheduled Meds: . amLODipine  10 mg Oral Daily  . azithromycin  500 mg Oral Daily  . heparin  5,000 Units Subcutaneous Q8H  . Ipratropium-Albuterol  1 puff Inhalation Q6H  . labetalol  300 mg Oral BID  . methylPREDNISolone (SOLU-MEDROL) injection  40 mg Intravenous Q12H  . sodium bicarbonate  1,300 mg Oral BID  . sodium chloride flush  3 mL Intravenous Q12H  . sodium chloride flush  3 mL Intravenous Q12H  . tacrolimus  4 mg Oral BID  . vitamin C  500 mg Oral Daily  . zinc sulfate  220 mg Oral Daily   Continuous Infusions: . sodium chloride    . cefTRIAXone (ROCEPHIN)  IV Stopped (01/04/19 0030)  . remdesivir 100 mg in NS 250 mL Stopped (01/03/19 1030)   PRN Meds:.sodium chloride, [DISCONTINUED] acetaminophen **OR** acetaminophen, acetaminophen, chlorpheniramine-HYDROcodone, guaiFENesin-dextromethorphan, HYDROcodone-acetaminophen, labetalol, ondansetron **OR** ondansetron (ZOFRAN) IV, senna-docusate, sodium chloride flush, traZODone   Time Spent in minutes  25  See all Orders from today for further details   Oren Binet M.D on 01/04/2019 at 10:58 AM  To page go to [email or web address] - use universal password  [facility] -  Office  [phone]    Objective:   Vitals:   01/03/19 [id number] 01/03/19 2000 01/04/19 0405 01/04/19 0809  BP: 135/72 (!) 154/71 Marland Kitchen)  157/66 (!) 162/73  Pulse: 76 77 79 83  Resp: 16  16 20   Temp: 98.2 F (36.8 C) 97.7 F (36.5 C) 97.7 F (36.5 C) 98.4 F (36.9 C)  TempSrc: Oral Oral Oral Oral  SpO2: 96% 98% 97% 96%  Weight:      Height:        Wt Readings from Last 3  Encounters:  01/01/19 53.5 kg  04/08/15 56.7 kg  06/08/11 56.7 kg     Intake/Output Summary (Last 24 hours) at 01/04/2019 1058 Last data filed at 01/04/2019 0019 Gross per 24 hour  Intake 1733.75 ml  Output -  Net 1733.75 ml     Physical Exam Gen Exam:Alert awake-not in any distress HEENT:atraumatic, normocephalic Chest: B/L clear to auscultation anteriorly CVS:S1S2 regular Abdomen:soft non tender, non distended Extremities:no edema Neurology: Non focal Skin: no rash   Data Review:    CBC Recent Labs  Lab 12/31/18 0821 01/01/19 0512 01/02/19 0600 01/03/19 0046 01/04/19 0040  WBC 15.7* 17.9* 22.7* 20.9* 17.1*  HGB 12.5* 12.5* 10.8* 10.4* 9.7*  HCT 37.0* 36.8* 31.7* 30.5* 28.4*  PLT 166 135* 122* 140* 138*  MCV 88.3 88.2 86.6 85.0 84.8  MCH 29.8 30.0 29.5 29.0 29.0  MCHC 33.8 34.0 34.1 34.1 34.2  RDW 15.5 16.7* 15.7* 15.2 15.7*  LYMPHSABS 2.0  --  1.3 0.6* 0.6*  MONOABS 1.8*  --  0.6 0.3 0.5  EOSABS 0.1  --  0.0 0.0 0.0  BASOSABS 0.0  --  0.0 0.0 0.0    Chemistries  Recent Labs  Lab 12/31/18 0821 01/01/19 0826 01/02/19 0600 01/03/19 0046 01/04/19 0040  NA 135 137 137 136 137  K 5.6* 4.9 4.3 4.3 3.4*  CL 110 113* 113* 112* 109  CO2 19* 15* 15* 14* 16*  GLUCOSE 94 60* 106* 248* 228*  BUN 45* 48* 63* 72* 80*  CREATININE 4.79* 4.30* 4.81* 4.71* 3.95*  CALCIUM 8.4* 7.9* 7.7* 7.8* 7.3*  MG  --   --  1.5* 2.2 1.8  AST 27 33 57* 58* 46*  ALT 15 12 18 18 19   ALKPHOS 60 47 44 45 52  BILITOT 1.4* 1.7* 0.9 0.5 0.5   ------------------------------------------------------------------------------------------------------------------ No results for input(s): CHOL, HDL, LDLCALC, TRIG, CHOLHDL, LDLDIRECT in the last 72 hours.  Lab Results  Component Value Date   HGBA1C 4.6 12/01/2010   ------------------------------------------------------------------------------------------------------------------ No results for input(s): TSH, T4TOTAL, T3FREE, THYROIDAB in  the last 72 hours.  Invalid input(s): FREET3 ------------------------------------------------------------------------------------------------------------------ Recent Labs    01/03/19 0046 01/04/19 0040  FERRITIN 2,257* 1,283*    Coagulation profile No results for input(s): INR, PROTIME in the last 168 hours.  Recent Labs    01/03/19 0046 01/04/19 0040  DDIMER 1.44* 0.88*    Cardiac Enzymes No results for input(s): CKMB, TROPONINI, MYOGLOBIN in the last 168 hours.  Invalid input(s): CK ------------------------------------------------------------------------------------------------------------------    Component Value Date/Time   BNP 154.8 (H) 01/01/2019 [id number]    Micro Results Recent Results (from the past 240 hour(s))  Culture, blood (Routine X 2) w Reflex to ID Panel     Status: None (Preliminary result)   Collection Time: 12/31/18  5:09 PM   Specimen: BLOOD  Result Value Ref Range Status   Specimen Description BLOOD RIGHT ANTECUBITAL  Final   Special Requests   Final    BOTTLES DRAWN AEROBIC AND ANAEROBIC Blood Culture results may not be optimal due to an inadequate volume of blood received in culture bottles   Culture  Final    NO GROWTH 4 DAYS Performed at [facility], [place] [address], [place], [place] [address]    Report Status PENDING  Incomplete  Culture, blood (Routine X 2) w Reflex to ID Panel     Status: None (Preliminary result)   Collection Time: 12/31/18  5:11 PM   Specimen: BLOOD RIGHT FOREARM  Result Value Ref Range Status   Specimen Description BLOOD RIGHT FOREARM  Final   Special Requests   Final    BOTTLES DRAWN AEROBIC ONLY Blood Culture results may not be optimal due to an inadequate volume of blood received in culture bottles   Culture   Final    NO GROWTH 4 DAYS Performed at [facility], [place] [address], [place], [place] [address]    Report Status PENDING  Incomplete  SARS CORONAVIRUS 2 (TAT 6-24 HRS) Nasopharyngeal  Nasopharyngeal Swab     Status: Abnormal   Collection Time: 12/31/18  6:36 PM   Specimen: Nasopharyngeal Swab  Result Value Ref Range Status   SARS Coronavirus 2 POSITIVE (A) NEGATIVE Final    Comment: RESULT CALLED TO, READ BACK BY AND VERIFIED WITH: P JOHNSTON,RN 0058 01/01/2019 D BRADLEY (NOTE) SARS-CoV-2 target nucleic acids are DETECTED. The SARS-CoV-2 RNA is generally detectable in upper and lower respiratory specimens during the acute phase of infection. Positive results are indicative of active infection with SARS-CoV-2. Clinical  correlation with patient history and other diagnostic information is necessary to determine patient infection status. Positive results do  not rule out bacterial infection or co-infection with other viruses. The expected result is Negative. Fact Sheet for Patients: SugarRoll.be Fact Sheet for Healthcare Providers: https://www.woods-mathews.com/ This test is not yet approved or cleared by the [place] FDA and  has been authorized for detection and/or diagnosis of SARS-CoV-2 by FDA under an Emergency Use Authorization (EUA). This EUA will remain  in effect (meaning this test can be used) f or the duration of the COVID-19 declaration under Section 564(b)(1) of the Act, 21 U.S.C. section 360bbb-3(b)(1), unless the authorization is terminated or revoked sooner. Performed at [facility], [place] [address] [place], [place] [address]     Radiology Reports Dg Chest 2 View  Result Date: 12/31/2018 CLINICAL DATA:  Sore throat, runny nose, cough and chills. EXAM: CHEST - 2 VIEW COMPARISON:  05/13/2013 FINDINGS: The heart size and mediastinal contours are within normal limits. Superimposed on chronic lung disease is some increased bronchial and interstitial prominence in the right lower lung which may be consistent with acute bronchitis and potentially early pneumonia. No pulmonary edema. The visualized  skeletal structures are unremarkable. IMPRESSION: Increased bronchial and interstitial prominence in the right lower lung which may be consistent with acute bronchitis and potentially early pneumonia. Electronically Signed   By: Aletta Edouard M.D.   On: 12/31/2018 08:46

## 2019-01-05 DIAGNOSIS — J9601 Acute respiratory failure with hypoxia: Secondary | ICD-10-CM

## 2019-01-05 DIAGNOSIS — J9600 Acute respiratory failure, unspecified whether with hypoxia or hypercapnia: Secondary | ICD-10-CM | POA: Diagnosis present

## 2019-01-05 LAB — COMPREHENSIVE METABOLIC PANEL
ALT: 21 U/L (ref 0–44)
AST: 45 U/L — ABNORMAL HIGH (ref 15–41)
Albumin: 2.1 g/dL — ABNORMAL LOW (ref 3.5–5.0)
Alkaline Phosphatase: 60 U/L (ref 38–126)
Anion gap: 12 (ref 5–15)
BUN: 86 mg/dL — ABNORMAL HIGH (ref 8–23)
CO2: 15 mmol/L — ABNORMAL LOW (ref 22–32)
Calcium: 7.5 mg/dL — ABNORMAL LOW (ref 8.9–10.3)
Chloride: 110 mmol/L (ref 98–111)
Creatinine, Ser: 3.97 mg/dL — ABNORMAL HIGH (ref 0.61–1.24)
GFR calc Af Amer: 16 mL/min — ABNORMAL LOW (ref >60)
GFR calc non Af Amer: 14 mL/min — ABNORMAL LOW (ref >60)
Glucose, Bld: 317 mg/dL — ABNORMAL HIGH (ref 70–99)
Potassium: 3.8 mmol/L (ref 3.5–5.1)
Sodium: 137 mmol/L (ref 135–145)
Total Bilirubin: 0.4 mg/dL (ref 0.3–1.2)
Total Protein: 7.1 g/dL (ref 6.5–8.1)

## 2019-01-05 LAB — CBC WITH DIFFERENTIAL/PLATELET
Abs Immature Granulocytes: 0.16 10*3/uL — ABNORMAL HIGH (ref 0.00–0.07)
Basophils Absolute: 0 10*3/uL (ref 0.0–0.1)
Basophils Relative: 0 %
Eosinophils Absolute: 0 10*3/uL (ref 0.0–0.5)
Eosinophils Relative: 0 %
HCT: 28.3 % — ABNORMAL LOW (ref 39.0–52.0)
Hemoglobin: 9.7 g/dL — ABNORMAL LOW (ref 13.0–17.0)
Immature Granulocytes: 2 %
Lymphocytes Relative: 4 %
Lymphs Abs: 0.5 10*3/uL — ABNORMAL LOW (ref 0.7–4.0)
MCH: 29.1 pg (ref 26.0–34.0)
MCHC: 34.3 g/dL (ref 30.0–36.0)
MCV: 85 fL (ref 80.0–100.0)
Monocytes Absolute: 0.6 10*3/uL (ref 0.1–1.0)
Monocytes Relative: 5 %
Neutro Abs: 9.5 10*3/uL — ABNORMAL HIGH (ref 1.7–7.7)
Neutrophils Relative %: 89 %
Platelets: 142 10*3/uL — ABNORMAL LOW (ref 150–400)
RBC: 3.33 MIL/uL — ABNORMAL LOW (ref 4.22–5.81)
RDW: 16.5 % — ABNORMAL HIGH (ref 11.5–15.5)
WBC: 10.7 10*3/uL — ABNORMAL HIGH (ref 4.0–10.5)
nRBC: 0.2 % (ref 0.0–0.2)

## 2019-01-05 LAB — GLUCOSE, CAPILLARY
Glucose-Capillary: 245 mg/dL — ABNORMAL HIGH (ref 70–99)
Glucose-Capillary: 253 mg/dL — ABNORMAL HIGH (ref 70–99)
Glucose-Capillary: 151 mg/dL — ABNORMAL HIGH (ref 70–99)

## 2019-01-05 LAB — PHOSPHORUS: Phosphorus: 3.8 mg/dL (ref 2.5–4.6)

## 2019-01-05 LAB — MAGNESIUM: Magnesium: 2.5 mg/dL — ABNORMAL HIGH (ref 1.7–2.4)

## 2019-01-05 LAB — HEMOGLOBIN A1C
Hgb A1c MFr Bld: 4.2 % — ABNORMAL LOW (ref 4.8–5.6)
Mean Plasma Glucose: 73.84 mg/dL

## 2019-01-05 LAB — D-DIMER, QUANTITATIVE: D-Dimer, Quant: 0.66 ug/mL-FEU — ABNORMAL HIGH (ref 0.00–0.50)

## 2019-01-05 LAB — FERRITIN: Ferritin: 1706 ng/mL — ABNORMAL HIGH (ref 24–336)

## 2019-01-05 LAB — CULTURE, BLOOD (ROUTINE X 2)
Culture: NO GROWTH
Culture: NO GROWTH

## 2019-01-05 LAB — LACTATE DEHYDROGENASE: LDH: 218 U/L — ABNORMAL HIGH (ref 98–192)

## 2019-01-05 LAB — C-REACTIVE PROTEIN: CRP: 10.9 mg/dL — ABNORMAL HIGH (ref <1.0)

## 2019-01-05 MED ORDER — INSULIN ASPART 100 UNIT/ML ~~LOC~~ SOLN
0.0000 [IU] | SUBCUTANEOUS | Status: DC
  Administered 2019-01-05: 4 [IU] via SUBCUTANEOUS
  Administered 2019-01-05: 16:00:00 11 [IU] via SUBCUTANEOUS
  Administered 2019-01-05: 12:00:00 7 [IU] via SUBCUTANEOUS
  Administered 2019-01-06 (×2): 3 [IU] via SUBCUTANEOUS

## 2019-01-05 MED ORDER — SULFAMETHOXAZOLE-TRIMETHOPRIM 400-80 MG PO TABS
1.0000 | ORAL_TABLET | ORAL | Status: DC
  Administered 2019-01-05: 1 via ORAL
  Filled 2019-01-05 (×2): qty 1

## 2019-01-05 MED ORDER — FERROUS SULFATE 325 (65 FE) MG PO TABS
325.0000 mg | ORAL_TABLET | Freq: Two times a day (BID) | ORAL | Status: DC
  Administered 2019-01-05 – 2019-01-06 (×2): 325 mg via ORAL
  Filled 2019-01-05 (×2): qty 1

## 2019-01-05 MED ORDER — DARBEPOETIN ALFA 300 MCG/0.6ML IJ SOSY: 300.0000 ug | PREFILLED_SYRINGE | INTRAMUSCULAR | Status: DC

## 2019-01-05 MED ORDER — SODIUM CHLORIDE 0.9 % IV SOLN: 250.0000 mL | INTRAVENOUS | Status: DC | PRN

## 2019-01-05 MED ORDER — CLONIDINE HCL 0.1 MG PO TABS
0.1000 mg | ORAL_TABLET | Freq: Two times a day (BID) | ORAL | Status: DC
  Administered 2019-01-05 – 2019-01-06 (×3): 0.1 mg via ORAL
  Filled 2019-01-05 (×3): qty 1

## 2019-01-05 MED ORDER — LABETALOL HCL 100 MG PO TABS
300.0000 mg | ORAL_TABLET | Freq: Three times a day (TID) | ORAL | Status: DC
  Administered 2019-01-05 – 2019-01-06 (×3): 300 mg via ORAL
  Filled 2019-01-05 (×3): qty 3

## 2019-01-05 MED ORDER — SODIUM BICARBONATE 8.4 % IV SOLN
50.0000 meq | Freq: Once | INTRAVENOUS | Status: AC
  Administered 2019-01-05: 50 meq via INTRAVENOUS

## 2019-01-05 MED ORDER — DEXAMETHASONE 6 MG PO TABS
6.0000 mg | ORAL_TABLET | Freq: Every day | ORAL | Status: DC
  Administered 2019-01-05 – 2019-01-06 (×2): 6 mg via ORAL
  Filled 2019-01-05 (×2): qty 1

## 2019-01-05 MED ORDER — ASPIRIN EC 81 MG PO TBEC
81.0000 mg | DELAYED_RELEASE_TABLET | Freq: Every day | ORAL | Status: DC
  Administered 2019-01-05 – 2019-01-06 (×2): 81 mg via ORAL
  Filled 2019-01-05 (×2): qty 1

## 2019-01-05 MED ORDER — TACROLIMUS 1 MG PO CAPS: 4.0000 mg | ORAL_CAPSULE | Freq: Two times a day (BID) | ORAL | Status: DC

## 2019-01-05 MED ORDER — SODIUM BICARBONATE 8.4 % IV SOLN: 50.0000 meq | Freq: Once | INTRAVENOUS | Status: DC

## 2019-01-05 MED ORDER — SODIUM BICARBONATE 650 MG PO TABS
650.0000 mg | ORAL_TABLET | Freq: Three times a day (TID) | ORAL | Status: DC
  Administered 2019-01-05 – 2019-01-06 (×3): 650 mg via ORAL
  Filled 2019-01-05 (×7): qty 1

## 2019-01-05 MED ORDER — DEXAMETHASONE 6 MG PO TABS: 6.0000 mg | ORAL_TABLET | Freq: Every day | ORAL | Status: DC

## 2019-01-05 NOTE — Progress Notes (Signed)
Attempted to contact family of situation and plan.  Left message to call back at [place].

## 2019-01-05 NOTE — Progress Notes (Signed)
Physical Therapy Treatment Patient Details Name: Francisco Williamson MRN: [id number] DOB: 06/23/1945 Today's Date: 01/05/2019    History of Present Illness Patient is a 73 y.o. male with PMHx of Renal transplant with CKD stage 5,HTN presented with approximately one week hx of cough,fever and mild SOB-found to have PNA-he was also found to be + for COVID-19.    PT Comments    tHE  PATIENT AMBULATED X 400' WITHOUT ASSISTANCE AND ON ra. nO sob. pATIENT HOPEFUL TO GO HOME TOMORROW.  nO FURTHER SKILLED pt NEEDS. Pt SIGNING OFF.    Follow Up Recommendations  No PT follow up     Equipment Recommendations  None recommended by PT    Recommendations for Other Services       Precautions / Restrictions Precautions Precautions: None    Mobility  Bed Mobility Overal bed mobility: Independent                Transfers Overall transfer level: Independent                  Ambulation/Gait Ambulation/Gait assistance: Independent Gait Distance (Feet): 400 Feet Assistive device: None Gait Pattern/deviations: WFL(Within Functional Limits)         Stairs             Wheelchair Mobility    Modified Rankin (Stroke Patients Only)       Balance Overall balance assessment: No apparent balance deficits (not formally assessed)                                          Cognition Arousal/Alertness: Awake/alert Behavior During Therapy: WFL for tasks assessed/performed Overall Cognitive Status: Within Functional Limits for tasks assessed                                        Exercises      General Comments        Pertinent Vitals/Pain      Home Living Family/patient expects to be discharged to:: Private residence Living Arrangements: Alone Available Help at Discharge: Family;Available PRN/intermittently Type of Home: House Home Access: Level entry   Home Layout: One level Home Equipment: None      Prior Function Level  of Independence: Independent      Comments: works at [employer] and travels with singing group   PT Goals (current goals can now be found in the care plan section) Acute Rehab PT Goals Patient Stated Goal: go home tomorrow PT Goal Formulation: With patient Time For Goal Achievement: 01/19/19 Potential to Achieve Goals: Good Progress towards PT goals: Progressing toward goals    Frequency    (PT SIGNING OFF)      PT Plan      Co-evaluation              AM-PAC PT "6 Clicks" Mobility   Outcome Measure  Help needed turning from your back to your side while in a flat bed without using bedrails?: None Help needed moving from lying on your back to sitting on the side of a flat bed without using bedrails?: None Help needed moving to and from a bed to a chair (including a wheelchair)?: None Help needed standing up from a chair using your arms (e.g., wheelchair or bedside chair)?: None Help needed  to walk in hospital room?: None Help needed climbing 3-5 steps with a railing? : None 6 Click Score: 24    End of Session   Activity Tolerance: Patient tolerated treatment well Patient left: in chair Nurse Communication: Mobility status PT Visit Diagnosis: Difficulty in walking, not elsewhere classified (R26.2)     Time: [id number] PT Time Calculation (min) (ACUTE ONLY): 23 min  Charges:  $Gait Training: 23-37 mins                     [place]  Office [phone]    Claretha Cooper 01/05/2019, 1:10 PM

## 2019-01-05 NOTE — Evaluation (Signed)
Physical Therapy Evaluation Patient Details Name: Francisco Williamson MRN: [id number] DOB: May 19, 1945 Today's Date: 01/05/2019   History of Present Illness  Patient is a 73 y.o. male with PMHx of Renal transplant with CKD stage 5,HTN presented with approximately one week hx of cough,fever and mild SOB-found to have PNA-he was also found to be + for COVID-19.  Clinical Impression  The  Patient is very motivated to ambulate, would like to wait until after breakfast. Patient should progress to Dc home. SPO2 on RA 95%. Will return to ambulate in hall at another time.     Follow Up Recommendations No PT follow up    Equipment Recommendations  None recommended by PT    Recommendations for Other Services       Precautions / Restrictions Precautions Precautions: None      Mobility  Bed Mobility Overal bed mobility: Independent                Transfers Overall transfer level: Independent                  Ambulation/Gait Ambulation/Gait assistance: Supervision Gait Distance (Feet): 40 Feet Assistive device: None Gait Pattern/deviations: WFL(Within Functional Limits)        Stairs            Wheelchair Mobility    Modified Rankin (Stroke Patients Only)       Balance Overall balance assessment: No apparent balance deficits (not formally assessed)                                           Pertinent Vitals/Pain      Home Living Family/patient expects to be discharged to:: Private residence Living Arrangements: Alone Available Help at Discharge: Family;Available PRN/intermittently Type of Home: House Home Access: Level entry     Home Layout: One level Home Equipment: None      Prior Function Level of Independence: Independent         Comments: works at [employer] and travels with singing group     Journalist, newspaper        Extremity/Trunk Assessment   Upper Extremity Assessment Upper Extremity Assessment:  Overall WFL for tasks assessed    Lower Extremity Assessment Lower Extremity Assessment: Overall WFL for tasks assessed    Cervical / Trunk Assessment Cervical / Trunk Assessment: Normal  Communication   Communication: No difficulties  Cognition Arousal/Alertness: Awake/alert Behavior During Therapy: WFL for tasks assessed/performed Overall Cognitive Status: Within Functional Limits for tasks assessed                                        General Comments      Exercises     Assessment/Plan    PT Assessment Patient needs continued PT services  PT Problem List Decreased strength;Decreased activity tolerance       PT Treatment Interventions Gait training;Patient/family education;Functional mobility training    PT Goals (Current goals can be found in the Care Plan section)  Acute Rehab PT Goals Patient Stated Goal: go home tomorrow PT Goal Formulation: With patient Time For Goal Achievement: 01/19/19 Potential to Achieve Goals: Good    Frequency Min 2X/week   Barriers to discharge        Co-evaluation  AM-PAC PT "6 Clicks" Mobility  Outcome Measure Help needed turning from your back to your side while in a flat bed without using bedrails?: None Help needed moving from lying on your back to sitting on the side of a flat bed without using bedrails?: None Help needed moving to and from a bed to a chair (including a wheelchair)?: None Help needed standing up from a chair using your arms (e.g., wheelchair or bedside chair)?: None Help needed to walk in hospital room?: A Little Help needed climbing 3-5 steps with a railing? : A Little 6 Click Score: 22    End of Session   Activity Tolerance: Patient tolerated treatment well Patient left: in bed Nurse Communication: Mobility status PT Visit Diagnosis: Difficulty in walking, not elsewhere classified (R26.2)    [id number] PT Time Calculation (min) (ACUTE ONLY): 17  min   Charges:   PT Evaluation $PT Eval Low Complexity: [place]  Office [phone]   Claretha Cooper 01/05/2019, 1:02 PM

## 2019-01-05 NOTE — Progress Notes (Signed)
PROGRESS NOTE    Francisco Williamson  [id number] DOB: [birth year] DOA: 12/31/2018 PCP: Default, Provider, MD   Brief Narrative:  73 y.o. BM PMHx  Renal transplant with CKD stage 5,HTN   presented with approximately one week hx of cough,fever and mild SOB-found to have PNA-he was also found to be + for COVID-19. See below for further details.   Subjective: Afebrile last 24 hours negative CP, negative abdominal pain, negative S OB.  States his normal creatinine level 2-3 and sees Dr. Vanetta Mulders Nephrology~every 4 months.   Assessment & Plan:   Principal Problem:   CAP (community acquired pneumonia) Active Problems:   Anemia in chronic kidney disease   CKD stage 5 secondary to hypertension ([place])   Hyperkalemia   Pneumonia due to COVID-19 virus   Acute respiratory failure with hypoxia ([facility])  Covid 19 pneumonia/superimposed CAP/acute respiratory failure with hypoxia -Complete course Remdesivir -11/2 change Solu-Medrol to Decadron 6 mg daily in preparation for discharge in a.m. -Combivent QID  COVID-19 Labs  Recent Labs    01/03/19 0046 01/04/19 0040 01/05/19 0533 01/05/19 0538 01/05/19 0548  DDIMER 1.44* 0.88* 0.66*  --   --   FERRITIN 2,257* 1,283*  --  1,706*  --   LDH  --   --   --   --  218*  CRP 23.8* 17.3*  --  10.9*  --     Lab Results  Component Value Date   SARSCOV2NAA POSITIVE (A) 12/31/2018    Acute on CKD stage V secondary to hypertension/renal transplant (normal creatinine level 2-3) Recent Labs  Lab 01/01/19 0826 01/02/19 0600 01/03/19 0046 01/04/19 0040 01/05/19 0533  CREATININE 4.30* 4.81* 4.71* 3.95* 3.97*  -Slightly above his baseline, but trending down -Patient very knowledgeable about his kidney function and how to take care of his renal transplant. -Patient has already stated upon discharge tomorrow will contact Dr. Rolan Lipa nephrology office and arrange follow-up -Normal saline 68ml/hr strict in and out -Daily weight  -Restart home immunosuppressant medication for renal transplant  Metabolic acidosis -[id number] patient had been receiving sodium bicarbonate p.o. 1300 mg will DC -11/2 restart home meds sodium bicarbonate p.o. 650 mg TID -11/2 1 amp sodium bicarb  Essential HTN -Amlodipine 10 mg daily -11/2 increase Labetalol 300 mg  TID -Labetalol PRN -11/2 clonidine 0.1 mg BID   Deconditioning -PT; no follow-up required  Hyperglycemia -Resistant SSI -Hemoglobin A1c pending -Lipid panel pending     DVT prophylaxis: Heparin Code Status: DNR Family Communication:  Disposition Plan: DC in a.m.   Consultants:    Procedures/Significant Events:     I have personally reviewed and interpreted all radiology studies and my findings are as above.  VENTILATOR SETTINGS: Room air SPO2 98%   Cultures   Antimicrobials: Anti-infectives (From admission, onward)   Start     Stop   01/05/19 1400  sulfamethoxazole-trimethoprim (BACTRIM) 400-80 MG per tablet 1 tablet    Note to Pharmacy: Take one tablet by mouth on Monday, Wednesday and fridays.         01/02/19 1200  azithromycin (ZITHROMAX) tablet 500 mg  Status:  Discontinued     01/05/19 1336   01/02/19 1000  remdesivir 100 mg in sodium chloride 0.9 % 250 mL IVPB     01/05/19 0832   01/01/19 1000  remdesivir 200 mg in sodium chloride 0.9 % 250 mL IVPB     01/01/19 1045   12/31/18 2100  azithromycin (ZITHROMAX) 500 mg in sodium chloride 0.9 % 250  mL IVPB  Status:  Discontinued     01/02/19 1007   12/31/18 2100  cefTRIAXone (ROCEPHIN) 1 g in sodium chloride 0.9 % 100 mL IVPB  Status:  Discontinued     01/05/19 1336   12/31/18 1645  cefTRIAXone (ROCEPHIN) 2 g in sodium chloride 0.9 % 100 mL IVPB  Status:  Discontinued     01/01/19 1247   12/31/18 1645  azithromycin (ZITHROMAX) 500 mg in sodium chloride 0.9 % 250 mL IVPB  Status:  Discontinued     01/01/19 1247       Devices    LINES / TUBES:      Continuous Infusions: . sodium  chloride    . cefTRIAXone (ROCEPHIN)  IV 1 g (01/04/19 2147)  . remdesivir 100 mg in NS 250 mL 100 mg (01/04/19 1154)     Objective: Vitals:   01/04/19 0809 01/04/19 1729 01/04/19 2027 01/05/19 0421  BP: (!) 162/73 (!) 149/69 (!) 161/78 (!) 162/71  Pulse: 83 79 75 81  Resp: 20 (!) 22 20 18   Temp: 98.4 F (36.9 C) 97.8 F (36.6 C) 97.6 F (36.4 C) 99.1 F (37.3 C)  TempSrc: Oral Oral Oral Oral  SpO2: 96% 100% 98% 98%  Weight:      Height:        Intake/Output Summary (Last 24 hours) at 01/05/2019 0741 Last data filed at 01/04/2019 1440 Gross per 24 hour  Intake 490 ml  Output -  Net 490 ml   Filed Weights   01/01/19 1940  Weight: 53.5 kg    Examination:  General: A/O x4 no acute respiratory distress, cachectic Eyes: negative scleral hemorrhage, negative anisocoria, negative icterus ENT: Negative Runny nose, negative gingival bleeding, Neck:  Negative scars, masses, torticollis, lymphadenopathy, JVD Lungs: Clear to auscultation bilaterally without wheezes or crackles Cardiovascular: Regular rate and rhythm without murmur gallop or rub normal S1 and S2 Abdomen: negative abdominal pain, nondistended, positive soft, bowel sounds, no rebound, no ascites, no appreciable mass Extremities: No significant cyanosis, clubbing, or edema bilateral lower extremities Skin: Negative rashes, lesions, ulcers Psychiatric:  Negative depression, negative anxiety, negative fatigue, negative mania  Central nervous system:  Cranial nerves II through XII intact, tongue/uvula midline, all extremities muscle strength 5/5, sensation intact throughout, negative dysarthria, negative expressive aphasia, negative receptive aphasia.  .     Data Reviewed: Care during the described time interval was provided by me .  I have reviewed this patient's available data, including medical history, events of note, physical examination, and all test results as part of my evaluation.   CBC: Recent Labs  Lab  12/31/18 0821 01/01/19 0512 01/02/19 0600 01/03/19 0046 01/04/19 0040 01/05/19 0533  WBC 15.7* 17.9* 22.7* 20.9* 17.1* 10.7*  NEUTROABS 11.7*  --  20.6* 19.8* 15.8* 9.5*  HGB 12.5* 12.5* 10.8* 10.4* 9.7* 9.7*  HCT 37.0* 36.8* 31.7* 30.5* 28.4* 28.3*  MCV 88.3 88.2 86.6 85.0 84.8 85.0  PLT 166 135* 122* 140* 138* [id number]*   Basic Metabolic Panel: Recent Labs  Lab 12/31/18 0821 01/01/19 0826 01/02/19 0600 01/03/19 0046 01/04/19 0040  NA 135 137 137 136 137  K 5.6* 4.9 4.3 4.3 3.4*  CL 110 113* 113* 112* 109  CO2 19* 15* 15* 14* 16*  GLUCOSE 94 60* 106* 248* 228*  BUN 45* 48* 63* 72* 80*  CREATININE 4.79* 4.30* 4.81* 4.71* 3.95*  CALCIUM 8.4* 7.9* 7.7* 7.8* 7.3*  MG  --   --  1.5* 2.2 1.8  PHOS  --   --  4.0  --   --    GFR: Estimated Creatinine Clearance: 13 mL/min (A) (by C-G formula based on SCr of 3.95 mg/dL (H)). Liver Function Tests: Recent Labs  Lab 12/31/18 0821 01/01/19 0826 01/02/19 0600 01/03/19 0046 01/04/19 0040  AST 27 33 57* 58* 46*  ALT 15 12 18 18 19   ALKPHOS 60 47 44 45 52  BILITOT 1.4* 1.7* 0.9 0.5 0.5  PROT 8.7* 7.3 7.8 7.4 7.1  ALBUMIN 2.7* 2.2* 2.3* 2.1* 2.0*   No results for input(s): LIPASE, AMYLASE in the last 168 hours. No results for input(s): AMMONIA in the last 168 hours. Coagulation Profile: No results for input(s): INR, PROTIME in the last 168 hours. Cardiac Enzymes: No results for input(s): CKTOTAL, CKMB, CKMBINDEX, TROPONINI in the last 168 hours. BNP (last 3 results) No results for input(s): PROBNP in the last 8760 hours. HbA1C: No results for input(s): HGBA1C in the last 72 hours. CBG: No results for input(s): GLUCAP in the last 168 hours. Lipid Profile: No results for input(s): CHOL, HDL, LDLCALC, TRIG, CHOLHDL, LDLDIRECT in the last 72 hours. Thyroid Function Tests: No results for input(s): TSH, T4TOTAL, FREET4, T3FREE, THYROIDAB in the last 72 hours. Anemia Panel: Recent Labs    01/03/19 0046 01/04/19 0040  FERRITIN  2,257* 1,283*   Urine analysis:    Component Value Date/Time   COLORURINE YELLOW 01/01/2019 0512   APPEARANCEUR HAZY (A) 01/01/2019 0512   LABSPEC 1.011 01/01/2019 0512   PHURINE 7.0 01/01/2019 0512   GLUCOSEU NEGATIVE 01/01/2019 0512   HGBUR SMALL (A) 01/01/2019 0512   BILIRUBINUR NEGATIVE 01/01/2019 0512   KETONESUR 5 (A) 01/01/2019 0512   PROTEINUR 100 (A) 01/01/2019 0512   UROBILINOGEN 1.0 12/20/2014 1252   NITRITE NEGATIVE 01/01/2019 0512   LEUKOCYTESUR NEGATIVE 01/01/2019 0512   Sepsis Labs: @LABRCNTIP (procalcitonin:4,lacticidven:4)  ) Recent Results (from the past 240 hour(s))  Culture, blood (Routine X 2) w Reflex to ID Panel     Status: None (Preliminary result)   Collection Time: 12/31/18  5:09 PM   Specimen: BLOOD  Result Value Ref Range Status   Specimen Description BLOOD RIGHT ANTECUBITAL  Final   Special Requests   Final    BOTTLES DRAWN AEROBIC AND ANAEROBIC Blood Culture results may not be optimal due to an inadequate volume of blood received in culture bottles   Culture   Final    NO GROWTH 4 DAYS Performed at [facility], [place] [address] [place], [place] [address]    Report Status PENDING  Incomplete  Culture, blood (Routine X 2) w Reflex to ID Panel     Status: None (Preliminary result)   Collection Time: 12/31/18  5:11 PM   Specimen: BLOOD RIGHT FOREARM  Result Value Ref Range Status   Specimen Description BLOOD RIGHT FOREARM  Final   Special Requests   Final    BOTTLES DRAWN AEROBIC ONLY Blood Culture results may not be optimal due to an inadequate volume of blood received in culture bottles   Culture   Final    NO GROWTH 4 DAYS Performed at [facility], [place] [address] [place], [place] [address]    Report Status PENDING  Incomplete  SARS CORONAVIRUS 2 (TAT 6-24 HRS) Nasopharyngeal Nasopharyngeal Swab     Status: Abnormal   Collection Time: 12/31/18  6:36 PM   Specimen: Nasopharyngeal Swab  Result Value Ref Range Status   SARS  Coronavirus 2 POSITIVE (A) NEGATIVE Final    Comment: RESULT CALLED  TO, READ BACK BY AND VERIFIED WITH: Angelena Sole [id number] 01/01/2019 D BRADLEY (NOTE) SARS-CoV-2 target nucleic acids are DETECTED. The SARS-CoV-2 RNA is generally detectable in upper and lower respiratory specimens during the acute phase of infection. Positive results are indicative of active infection with SARS-CoV-2. Clinical  correlation with patient history and other diagnostic information is necessary to determine patient infection status. Positive results do  not rule out bacterial infection or co-infection with other viruses. The expected result is Negative. Fact Sheet for Patients: SugarRoll.be Fact Sheet for Healthcare Providers: https://www.woods-mathews.com/ This test is not yet approved or cleared by the [place] FDA and  has been authorized for detection and/or diagnosis of SARS-CoV-2 by FDA under an Emergency Use Authorization (EUA). This EUA will remain  in effect (meaning this test can be used) f or the duration of the COVID-19 declaration under Section 564(b)(1) of the Act, 21 U.S.C. section 360bbb-3(b)(1), unless the authorization is terminated or revoked sooner. Performed at [facility], [place] [address] [place], [place] [address]          Radiology Studies: No results found.      Scheduled Meds: . amLODipine  10 mg Oral Daily  . azithromycin  500 mg Oral Daily  . heparin  5,000 Units Subcutaneous Q8H  . Ipratropium-Albuterol  1 puff Inhalation Q6H  . labetalol  300 mg Oral BID  . methylPREDNISolone (SOLU-MEDROL) injection  40 mg Intravenous Q12H  . sodium bicarbonate  1,300 mg Oral BID  . sodium chloride flush  3 mL Intravenous Q12H  . sodium chloride flush  3 mL Intravenous Q12H  . tacrolimus  4 mg Oral BID  . vitamin C  500 mg Oral Daily  . zinc sulfate  220 mg Oral Daily   Continuous Infusions: . sodium chloride    .  cefTRIAXone (ROCEPHIN)  IV 1 g (01/04/19 2147)  . remdesivir 100 mg in NS 250 mL 100 mg (01/04/19 1154)     LOS: 5 days   The patient is critically ill with multiple organ systems failure and requires high complexity decision making for assessment and support, frequent evaluation and titration of therapies, application of advanced monitoring technologies and extensive interpretation of multiple databases. Critical Care Time devoted to patient care services described in this note  Time spent: 40 minutes     WOODS, Geraldo Docker, MD [facility] Pager [phone]  If 7PM-7AM, please contact night-coverage [email or web address] Password [facility] 01/05/2019, 7:41 AM

## 2019-01-06 DIAGNOSIS — N1832 Chronic kidney disease, stage 3b: Secondary | ICD-10-CM

## 2019-01-06 DIAGNOSIS — D631 Anemia in chronic kidney disease: Secondary | ICD-10-CM

## 2019-01-06 DIAGNOSIS — E875 Hyperkalemia: Secondary | ICD-10-CM

## 2019-01-06 LAB — D-DIMER, QUANTITATIVE: D-Dimer, Quant: 0.7 ug/mL-FEU — ABNORMAL HIGH (ref 0.00–0.50)

## 2019-01-06 LAB — COMPREHENSIVE METABOLIC PANEL
ALT: 32 U/L (ref 0–44)
AST: 52 U/L — ABNORMAL HIGH (ref 15–41)
Albumin: 2.1 g/dL — ABNORMAL LOW (ref 3.5–5.0)
Alkaline Phosphatase: 58 U/L (ref 38–126)
Anion gap: 7 (ref 5–15)
BUN: 81 mg/dL — ABNORMAL HIGH (ref 8–23)
CO2: 20 mmol/L — ABNORMAL LOW (ref 22–32)
Calcium: 7.6 mg/dL — ABNORMAL LOW (ref 8.9–10.3)
Chloride: 112 mmol/L — ABNORMAL HIGH (ref 98–111)
Creatinine, Ser: 3.58 mg/dL — ABNORMAL HIGH (ref 0.61–1.24)
GFR calc Af Amer: 19 mL/min — ABNORMAL LOW (ref >60)
GFR calc non Af Amer: 16 mL/min — ABNORMAL LOW (ref >60)
Glucose, Bld: 121 mg/dL — ABNORMAL HIGH (ref 70–99)
Potassium: 3.4 mmol/L — ABNORMAL LOW (ref 3.5–5.1)
Sodium: 139 mmol/L (ref 135–145)
Total Bilirubin: 0.3 mg/dL (ref 0.3–1.2)
Total Protein: 6.8 g/dL (ref 6.5–8.1)

## 2019-01-06 LAB — LIPID PANEL
Cholesterol: 77 mg/dL (ref 0–200)
HDL: 27 mg/dL — ABNORMAL LOW (ref >40)
LDL Cholesterol: 33 mg/dL (ref 0–99)
Total CHOL/HDL Ratio: 2.9 RATIO
Triglycerides: 85 mg/dL (ref <150)
VLDL: 17 mg/dL (ref 0–40)

## 2019-01-06 LAB — CBC WITH DIFFERENTIAL/PLATELET
Abs Immature Granulocytes: 0.2 10*3/uL — ABNORMAL HIGH (ref 0.00–0.07)
Basophils Absolute: 0 10*3/uL (ref 0.0–0.1)
Basophils Relative: 0 %
Eosinophils Absolute: 0 10*3/uL (ref 0.0–0.5)
Eosinophils Relative: 0 %
HCT: 27.3 % — ABNORMAL LOW (ref 39.0–52.0)
Hemoglobin: 9.4 g/dL — ABNORMAL LOW (ref 13.0–17.0)
Immature Granulocytes: 2 %
Lymphocytes Relative: 6 %
Lymphs Abs: 0.6 10*3/uL — ABNORMAL LOW (ref 0.7–4.0)
MCH: 28.7 pg (ref 26.0–34.0)
MCHC: 34.4 g/dL (ref 30.0–36.0)
MCV: 83.2 fL (ref 80.0–100.0)
Monocytes Absolute: 0.7 10*3/uL (ref 0.1–1.0)
Monocytes Relative: 8 %
Neutro Abs: 7.4 10*3/uL (ref 1.7–7.7)
Neutrophils Relative %: 84 %
Platelets: 154 10*3/uL (ref 150–400)
RBC: 3.28 MIL/uL — ABNORMAL LOW (ref 4.22–5.81)
RDW: 15.8 % — ABNORMAL HIGH (ref 11.5–15.5)
WBC: 8.9 10*3/uL (ref 4.0–10.5)
nRBC: 0 % (ref 0.0–0.2)

## 2019-01-06 LAB — FERRITIN: Ferritin: 1390 ng/mL — ABNORMAL HIGH (ref 24–336)

## 2019-01-06 LAB — HEMOGLOBIN A1C
Hgb A1c MFr Bld: 4.2 % — ABNORMAL LOW (ref 4.8–5.6)
Mean Plasma Glucose: 73.84 mg/dL

## 2019-01-06 LAB — GLUCOSE, CAPILLARY
Glucose-Capillary: 115 mg/dL — ABNORMAL HIGH (ref 70–99)
Glucose-Capillary: 123 mg/dL — ABNORMAL HIGH (ref 70–99)
Glucose-Capillary: 125 mg/dL — ABNORMAL HIGH (ref 70–99)

## 2019-01-06 LAB — C-REACTIVE PROTEIN: CRP: 7.2 mg/dL — ABNORMAL HIGH (ref <1.0)

## 2019-01-06 LAB — MAGNESIUM: Magnesium: 2.1 mg/dL (ref 1.7–2.4)

## 2019-01-06 MED ORDER — ONDANSETRON HCL 4 MG PO TABS: 4.0000 mg | ORAL_TABLET | Freq: Four times a day (QID) | ORAL | 0 refills | Status: DC | PRN

## 2019-01-06 MED ORDER — IPRATROPIUM-ALBUTEROL 20-100 MCG/ACT IN AERS: 1.0000 | INHALATION_SPRAY | Freq: Four times a day (QID) | RESPIRATORY_TRACT | 0 refills | Status: AC

## 2019-01-06 MED ORDER — LABETALOL HCL 300 MG PO TABS: 300.0000 mg | ORAL_TABLET | Freq: Three times a day (TID) | ORAL | 0 refills | Status: AC

## 2019-01-06 MED ORDER — CLONIDINE HCL 0.2 MG PO TABS: 0.2000 mg | ORAL_TABLET | Freq: Two times a day (BID) | ORAL | 0 refills | Status: AC

## 2019-01-06 MED ORDER — CLONIDINE HCL 0.1 MG PO TABS: 0.2000 mg | ORAL_TABLET | Freq: Two times a day (BID) | ORAL | Status: DC

## 2019-01-06 MED ORDER — ACETAMINOPHEN 325 MG PO TABS: 650.0000 mg | ORAL_TABLET | Freq: Four times a day (QID) | ORAL | 0 refills | Status: AC | PRN

## 2019-01-06 MED ORDER — ZINC SULFATE 220 (50 ZN) MG PO CAPS: 220.0000 mg | ORAL_CAPSULE | Freq: Every day | ORAL | 0 refills | Status: AC

## 2019-01-06 MED ORDER — ASCORBIC ACID 500 MG PO TABS: 500.0000 mg | ORAL_TABLET | Freq: Every day | ORAL | 0 refills | Status: AC

## 2019-01-06 MED ORDER — TRAZODONE HCL 50 MG PO TABS: 25.0000 mg | ORAL_TABLET | Freq: Every evening | ORAL | 0 refills | Status: DC | PRN

## 2019-01-06 MED ORDER — CLONIDINE HCL 0.1 MG PO TABS
0.1000 mg | ORAL_TABLET | Freq: Once | ORAL | Status: AC
  Administered 2019-01-06: 0.1 mg via ORAL
  Filled 2019-01-06: qty 1

## 2019-01-06 MED ORDER — CLONIDINE HCL 0.2 MG PO TABS: 0.2000 mg | ORAL_TABLET | Freq: Two times a day (BID) | ORAL | 0 refills | Status: DC

## 2019-01-06 NOTE — Discharge Summary (Signed)
Physician Discharge Summary  Takoma Schuelke [id number] DOB: 06-18-1945 DOA: 12/31/2018  PCP: Default, Provider, MD  Admit date: 12/31/2018 Discharge date: 01/06/2019  Time spent: 30 minutes  Recommendations for Outpatient Follow-up:   Covid 19 pneumonia/superimposed CAP/acute respiratory failure with hypoxia -Complete course Remdesivir -11/2 change Solu-Medrol to Decadron 6 mg daily  -11/3 patient on prednisone 5 mg daily for his renal transplant he will be counseled to continue that medication and we will DC Decadron upon discharge. -Combivent QID  COVID-19 Labs  Recent Labs (last 2 labs)          Recent Labs    01/03/19 0046 01/04/19 0040 01/05/19 0533 01/05/19 0538 01/05/19 0548  DDIMER 1.44* 0.88* 0.66*  --   --   FERRITIN 2,257* 1,283*  --  1,706*  --   LDH  --   --   --   --  218*  CRP 23.8* 17.3*  --  10.9*  --       Recent Labs       Lab Results  Component Value Date   SARSCOV2NAA POSITIVE (A) 12/31/2018      Acute on CKD stage V secondary to hypertension/renal transplant (normal creatinine level 2-3) -Normal saline 40ml/hr strict in and out +5.2 L  Recent Labs  Lab 01/01/19 0826 01/02/19 0600 01/03/19 0046 01/04/19 0040 01/05/19 0533 01/06/19 0045  CREATININE 4.30* 4.81* 4.71* 3.95* 3.97* 3.58*  -Daily weight Filed Weights   01/01/19 1940  Weight: 53.5 kg  -Restart home immunosuppressant medication for renal transplant  Metabolic acidosis -[id number] restart home meds sodium bicarbonate p.o. 650 mg TID -11/2 1 amp sodium bicarb  Essential HTN -Amlodipine 10 mg daily -11/2 increase Labetalol 300 mg  TID -11/3 increase clonidine 0.2 mg BID   Deconditioning -PT; no follow-up required  Hyperglycemia -Resistant SSI -11/3 hemoglobin A1c= 4.2 -11/3 LDL= 33    Discharge Diagnoses:  Principal Problem:   CAP (community acquired pneumonia) Active Problems:   Anemia in chronic kidney disease   CKD stage 5 secondary to hypertension  (HCC)   Hyperkalemia   Pneumonia due to COVID-19 virus   Acute respiratory failure with hypoxia ([place])   Discharge Condition: Stable  Diet recommendation: Renal diet  Filed Weights   01/01/19 1940  Weight: 53.5 kg    History of present illness:  73 y.o.BM PMHx  Renal transplant with CKD stage 5,HTN   presented with approximately one week hx of cough,fever and mild SOB-found to have PNA-he was also found to be + for COVID-19. See below for further details.   Hospital Course:  During his hospitalization patient was treated for Covid pneumonia, responded well to treatment.  Course complicated by acute on CKD stage V which by at time of discharge has nearly returned to normal.  We will need to continue to monitor closely.  Stable for discharge.   Ventilator settings Room air SPO2; 98%  Cultures 10/28 SARS coronavirus positive  Antibiotics Anti-infectives (From admission, onward)   Start     Stop   01/05/19 1400  sulfamethoxazole-trimethoprim (BACTRIM) 400-80 MG per tablet 1 tablet    Note to Pharmacy: Take one tablet by mouth on Monday, Wednesday and fridays.         01/02/19 1200  azithromycin (ZITHROMAX) tablet 500 mg  Status:  Discontinued     01/05/19 1336   01/02/19 1000  remdesivir 100 mg in sodium chloride 0.9 % 250 mL IVPB     01/05/19 0832   01/01/19 1000  remdesivir 200  mg in sodium chloride 0.9 % 250 mL IVPB     01/01/19 1045   12/31/18 2100  azithromycin (ZITHROMAX) 500 mg in sodium chloride 0.9 % 250 mL IVPB  Status:  Discontinued     01/02/19 1007   12/31/18 2100  cefTRIAXone (ROCEPHIN) 1 g in sodium chloride 0.9 % 100 mL IVPB  Status:  Discontinued     01/05/19 1336   12/31/18 1645  cefTRIAXone (ROCEPHIN) 2 g in sodium chloride 0.9 % 100 mL IVPB  Status:  Discontinued     01/01/19 1247   12/31/18 1645  azithromycin (ZITHROMAX) 500 mg in sodium chloride 0.9 % 250 mL IVPB  Status:  Discontinued     01/01/19 1247       Discharge Exam: Vitals:    01/05/19 1600 01/05/19 2022 01/06/19 0538 01/06/19 0707  BP: (!) 170/72 (!) 149/69 (!) 144/68 (!) 153/75  Pulse: 78 73 73 73  Resp:  20 18   Temp: (!) 97.5 F (36.4 C) (!) 97.5 F (36.4 C) 97.6 F (36.4 C) 97.6 F (36.4 C)  TempSrc: Oral Oral Oral Oral  SpO2: 94% 98% 98% 98%  Weight:      Height:        General: A/O x4 no acute respiratory distress, cachectic Eyes: negative scleral hemorrhage, negative anisocoria, negative icterus ENT: Negative Runny nose, negative gingival bleeding, Neck:  Negative scars, masses, torticollis, lymphadenopathy, JVD Lungs: Clear to auscultation bilaterally without wheezes or crackles Cardiovascular: Regular rate and rhythm without murmur gallop or rub normal S1 and S2    Discharge Instructions   Allergies as of 01/06/2019   No Known Allergies     Medication List    TAKE these medications   acetaminophen 325 MG tablet Commonly known as: TYLENOL Take 2 tablets (650 mg total) by mouth every 6 (six) hours as needed for mild pain or headache (fever >/= 101).   amLODipine 10 MG tablet Commonly known as: NORVASC Take 10 mg by mouth daily.   ascorbic acid 500 MG tablet Commonly known as: VITAMIN C Take 1 tablet (500 mg total) by mouth daily. Start taking on: January 07, 2019   aspirin EC 81 MG tablet Take 81 mg by mouth daily.   calcitRIOL 0.5 MCG capsule Commonly known as: ROCALTROL Take 0.5 mcg by mouth daily.   cloNIDine 0.2 MG tablet Commonly known as: CATAPRES Take 1 tablet (0.2 mg total) by mouth 2 (two) times daily.   Darbepoetin Alfa 300 MCG/0.6ML Sosy injection Commonly known as: ARANESP Inject 300 mcg into the skin every 7 (seven) days.   ferrous sulfate 325 (65 FE) MG EC tablet Take 325 mg by mouth 2 (two) times daily.   Ipratropium-Albuterol 20-100 MCG/ACT Aers respimat Commonly known as: COMBIVENT Inhale 1 puff into the lungs every 6 (six) hours.   labetalol 300 MG tablet Commonly known as: NORMODYNE Take 1  tablet (300 mg total) by mouth 3 (three) times daily. What changed: when to take this   ondansetron 4 MG tablet Commonly known as: ZOFRAN Take 1 tablet (4 mg total) by mouth every 6 (six) hours as needed for nausea.   PREDNISONE PO Take 5 mg by mouth 2 (two) times daily.   sildenafil 100 MG tablet Commonly known as: VIAGRA Take 100 mg by mouth as needed for erectile dysfunction.   sodium bicarbonate 650 MG tablet Take 650 mg by mouth 3 (three) times daily.   sulfamethoxazole-trimethoprim 400-80 MG tablet Commonly known as: BACTRIM Take 1 tablet by  mouth every Monday, Wednesday, and Friday. Take one tablet by mouth on Monday, Wednesday and fridays.   tacrolimus 1 MG capsule Commonly known as: PROGRAF Take 4 mg by mouth 2 (two) times daily.   traZODone 50 MG tablet Commonly known as: DESYREL Take 0.5 tablets (25 mg total) by mouth at bedtime as needed for sleep.   zinc sulfate 220 (50 Zn) MG capsule Take 1 capsule (220 mg total) by mouth daily. Start taking on: January 07, 2019      No Known Allergies    The results of significant diagnostics from this hospitalization (including imaging, microbiology, ancillary and laboratory) are listed below for reference.    Significant Diagnostic Studies: Dg Chest 2 View  Result Date: 12/31/2018 CLINICAL DATA:  Sore throat, runny nose, cough and chills. EXAM: CHEST - 2 VIEW COMPARISON:  05/13/2013 FINDINGS: The heart size and mediastinal contours are within normal limits. Superimposed on chronic lung disease is some increased bronchial and interstitial prominence in the right lower lung which may be consistent with acute bronchitis and potentially early pneumonia. No pulmonary edema. The visualized skeletal structures are unremarkable. IMPRESSION: Increased bronchial and interstitial prominence in the right lower lung which may be consistent with acute bronchitis and potentially early pneumonia. Electronically Signed   By: Aletta Edouard M.D.   On: 12/31/2018 08:46    Microbiology: Recent Results (from the past 240 hour(s))  Culture, blood (Routine X 2) w Reflex to ID Panel     Status: None   Collection Time: 12/31/18  5:09 PM   Specimen: BLOOD  Result Value Ref Range Status   Specimen Description BLOOD RIGHT ANTECUBITAL  Final   Special Requests   Final    BOTTLES DRAWN AEROBIC AND ANAEROBIC Blood Culture results may not be optimal due to an inadequate volume of blood received in culture bottles   Culture   Final    NO GROWTH 5 DAYS Performed at [facility], [place] [address] [place], [place] [address]    Report Status 01/05/2019 FINAL  Final  Culture, blood (Routine X 2) w Reflex to ID Panel     Status: None   Collection Time: 12/31/18  5:11 PM   Specimen: BLOOD RIGHT FOREARM  Result Value Ref Range Status   Specimen Description BLOOD RIGHT FOREARM  Final   Special Requests   Final    BOTTLES DRAWN AEROBIC ONLY Blood Culture results may not be optimal due to an inadequate volume of blood received in culture bottles   Culture   Final    NO GROWTH 5 DAYS Performed at [facility], [place] [address], [place], [place] [address]    Report Status 01/05/2019 FINAL  Final  SARS CORONAVIRUS 2 (TAT 6-24 HRS) Nasopharyngeal Nasopharyngeal Swab     Status: Abnormal   Collection Time: 12/31/18  6:36 PM   Specimen: Nasopharyngeal Swab  Result Value Ref Range Status   SARS Coronavirus 2 POSITIVE (A) NEGATIVE Final    Comment: RESULT CALLED TO, READ BACK BY AND VERIFIED WITH: P JOHNSTON,RN 0058 01/01/2019 D BRADLEY (NOTE) SARS-CoV-2 target nucleic acids are DETECTED. The SARS-CoV-2 RNA is generally detectable in upper and lower respiratory specimens during the acute phase of infection. Positive results are indicative of active infection with SARS-CoV-2. Clinical  correlation with patient history and other diagnostic information is necessary to determine patient infection status. Positive results do  not  rule out bacterial infection or co-infection with other viruses. The expected result is Negative.  Fact Sheet for Patients: SugarRoll.be Fact Sheet for Healthcare Providers: https://www.[employer]-mathews.com/ This test is not yet approved or cleared by the [place] FDA and  has been authorized for detection and/or diagnosis of SARS-CoV-2 by FDA under an Emergency Use Authorization (EUA). This EUA will remain  in effect (meaning this test can be used) f or the duration of the COVID-19 declaration under Section 564(b)(1) of the Act, 21 U.S.C. section 360bbb-3(b)(1), unless the authorization is terminated or revoked sooner. Performed at [facility], [place] [address], [place], [place] [address]      Labs: Basic Metabolic Panel: Recent Labs  Lab 01/02/19 0600 01/03/19 0046 01/04/19 0040 01/05/19 0533 01/05/19 0548 01/06/19 0045  NA 137 136 137 137  --  139  K 4.3 4.3 3.4* 3.8  --  3.4*  CL 113* 112* 109 110  --  112*  CO2 15* 14* 16* 15*  --  20*  GLUCOSE 106* 248* 228* 317*  --  121*  BUN 63* 72* 80* 86*  --  81*  CREATININE 4.81* 4.71* 3.95* 3.97*  --  3.58*  CALCIUM 7.7* 7.8* 7.3* 7.5*  --  7.6*  MG 1.5* 2.2 1.8 2.5*  --  2.1  PHOS 4.0  --   --   --  3.8  --    Liver Function Tests: Recent Labs  Lab 01/02/19 0600 01/03/19 0046 01/04/19 0040 01/05/19 0533 01/06/19 0045  AST 57* 58* 46* 45* 52*  ALT 18 18 19 21  32  ALKPHOS 44 45 52 60 58  BILITOT 0.9 0.5 0.5 0.4 0.3  PROT 7.8 7.4 7.1 7.1 6.8  ALBUMIN 2.3* 2.1* 2.0* 2.1* 2.1*   No results for input(s): LIPASE, AMYLASE in the last 168 hours. No results for input(s): AMMONIA in the last 168 hours. CBC: Recent Labs  Lab 01/02/19 0600 01/03/19 0046 01/04/19 0040 01/05/19 0533 01/06/19 0045  WBC 22.7* 20.9* 17.1* 10.7* 8.9  NEUTROABS 20.6* 19.8* 15.8* 9.5* 7.4  HGB 10.8* 10.4* 9.7* 9.7* 9.4*  HCT 31.7* 30.5* 28.4* 28.3* 27.3*  MCV 86.6 85.0 84.8 85.0 83.2  PLT  122* 140* 138* 142* 154   Cardiac Enzymes: No results for input(s): CKTOTAL, CKMB, CKMBINDEX, TROPONINI in the last 168 hours. BNP: BNP (last 3 results) Recent Labs    01/01/19 0826  BNP 154.8*    ProBNP (last 3 results) No results for input(s): PROBNP in the last 8760 hours.  CBG: Recent Labs  Lab 01/05/19 1618 01/05/19 2025 01/06/19 0009 01/06/19 0535 01/06/19 0733  GLUCAP 253* 151* 123* 125* 115*       Signed:  Dia Crawford, MD [facility] [phone] pager

## 2019-01-06 NOTE — Progress Notes (Signed)
D/c education provided to pt. 4 pages of RX scripts given to pt to take to pharmacy. Pt is educated on all medication and when to take them again. Pt verbalizes understanding. Awaiting family to bring clothing for pt then pt will go home via family. No questions at this time.

## 2019-01-09 ENCOUNTER — Inpatient Hospital Stay (HOSPITAL_COMMUNITY)
Admission: RE | Admit: 2019-01-09 | Discharge: 2019-01-09 | Disposition: A | Discharge status: Home / Self Care | Payer: PRIVATE HEALTH INSURANCE | Source: Ambulatory Visit | Attending: Nephrology | Admitting: Nephrology

## 2019-01-11 ENCOUNTER — Other Ambulatory Visit: Payer: Self-pay

## 2019-01-11 ENCOUNTER — Emergency Department (HOSPITAL_COMMUNITY): Payer: Medicare Other

## 2019-01-11 ENCOUNTER — Inpatient Hospital Stay (HOSPITAL_COMMUNITY)
Admission: EM | Admit: 2019-01-11 | Discharge: 2019-02-06 | DRG: 871 | Disposition: A | Discharge status: Home Health | Payer: Medicare Other | Attending: Internal Medicine | Admitting: Internal Medicine

## 2019-01-11 DIAGNOSIS — R0902 Hypoxemia: Secondary | ICD-10-CM

## 2019-01-11 DIAGNOSIS — Z7982 Long term (current) use of aspirin: Secondary | ICD-10-CM

## 2019-01-11 DIAGNOSIS — N189 Chronic kidney disease, unspecified: Secondary | ICD-10-CM | POA: Diagnosis present

## 2019-01-11 DIAGNOSIS — Z992 Dependence on renal dialysis: Secondary | ICD-10-CM | POA: Diagnosis not present

## 2019-01-11 DIAGNOSIS — R197 Diarrhea, unspecified: Secondary | ICD-10-CM | POA: Diagnosis not present

## 2019-01-11 DIAGNOSIS — Z79899 Other long term (current) drug therapy: Secondary | ICD-10-CM | POA: Diagnosis not present

## 2019-01-11 DIAGNOSIS — Z7189 Other specified counseling: Secondary | ICD-10-CM | POA: Diagnosis not present

## 2019-01-11 DIAGNOSIS — T502X5A Adverse effect of carbonic-anhydrase inhibitors, benzothiadiazides and other diuretics, initial encounter: Secondary | ICD-10-CM | POA: Diagnosis not present

## 2019-01-11 DIAGNOSIS — N390 Urinary tract infection, site not specified: Secondary | ICD-10-CM | POA: Diagnosis present

## 2019-01-11 DIAGNOSIS — R0689 Other abnormalities of breathing: Secondary | ICD-10-CM | POA: Diagnosis not present

## 2019-01-11 DIAGNOSIS — A419 Sepsis, unspecified organism: Secondary | ICD-10-CM | POA: Diagnosis not present

## 2019-01-11 DIAGNOSIS — D500 Iron deficiency anemia secondary to blood loss (chronic): Secondary | ICD-10-CM | POA: Diagnosis present

## 2019-01-11 DIAGNOSIS — J9601 Acute respiratory failure with hypoxia: Secondary | ICD-10-CM | POA: Diagnosis present

## 2019-01-11 DIAGNOSIS — E860 Dehydration: Secondary | ICD-10-CM | POA: Diagnosis present

## 2019-01-11 DIAGNOSIS — E876 Hypokalemia: Secondary | ICD-10-CM | POA: Diagnosis not present

## 2019-01-11 DIAGNOSIS — Y92239 Unspecified place in hospital as the place of occurrence of the external cause: Secondary | ICD-10-CM | POA: Diagnosis not present

## 2019-01-11 DIAGNOSIS — J189 Pneumonia, unspecified organism: Secondary | ICD-10-CM | POA: Diagnosis present

## 2019-01-11 DIAGNOSIS — R6520 Severe sepsis without septic shock: Secondary | ICD-10-CM

## 2019-01-11 DIAGNOSIS — I120 Hypertensive chronic kidney disease with stage 5 chronic kidney disease or end stage renal disease: Secondary | ICD-10-CM | POA: Diagnosis not present

## 2019-01-11 DIAGNOSIS — Z681 Body mass index (BMI) 19 or less, adult: Secondary | ICD-10-CM | POA: Diagnosis not present

## 2019-01-11 DIAGNOSIS — D631 Anemia in chronic kidney disease: Secondary | ICD-10-CM | POA: Diagnosis present

## 2019-01-11 DIAGNOSIS — N185 Chronic kidney disease, stage 5: Secondary | ICD-10-CM | POA: Diagnosis present

## 2019-01-11 DIAGNOSIS — R627 Adult failure to thrive: Secondary | ICD-10-CM | POA: Diagnosis not present

## 2019-01-11 DIAGNOSIS — A411 Sepsis due to other specified staphylococcus: Secondary | ICD-10-CM | POA: Diagnosis present

## 2019-01-11 DIAGNOSIS — E870 Hyperosmolality and hypernatremia: Secondary | ICD-10-CM | POA: Diagnosis present

## 2019-01-11 DIAGNOSIS — N184 Chronic kidney disease, stage 4 (severe): Secondary | ICD-10-CM | POA: Diagnosis not present

## 2019-01-11 DIAGNOSIS — Z7952 Long term (current) use of systemic steroids: Secondary | ICD-10-CM

## 2019-01-11 DIAGNOSIS — R64 Cachexia: Secondary | ICD-10-CM | POA: Diagnosis present

## 2019-01-11 DIAGNOSIS — J1289 Other viral pneumonia: Secondary | ICD-10-CM | POA: Diagnosis not present

## 2019-01-11 DIAGNOSIS — Z66 Do not resuscitate: Secondary | ICD-10-CM | POA: Diagnosis present

## 2019-01-11 DIAGNOSIS — E43 Unspecified severe protein-calorie malnutrition: Secondary | ICD-10-CM | POA: Diagnosis present

## 2019-01-11 DIAGNOSIS — N170 Acute kidney failure with tubular necrosis: Secondary | ICD-10-CM | POA: Diagnosis not present

## 2019-01-11 DIAGNOSIS — R531 Weakness: Secondary | ICD-10-CM | POA: Diagnosis not present

## 2019-01-11 DIAGNOSIS — Z940 Kidney transplant status: Secondary | ICD-10-CM

## 2019-01-11 DIAGNOSIS — J90 Pleural effusion, not elsewhere classified: Secondary | ICD-10-CM | POA: Diagnosis present

## 2019-01-11 DIAGNOSIS — H5462 Unqualified visual loss, left eye, normal vision right eye: Secondary | ICD-10-CM | POA: Diagnosis present

## 2019-01-11 DIAGNOSIS — I361 Nonrheumatic tricuspid (valve) insufficiency: Secondary | ICD-10-CM | POA: Diagnosis not present

## 2019-01-11 DIAGNOSIS — D696 Thrombocytopenia, unspecified: Secondary | ICD-10-CM | POA: Diagnosis present

## 2019-01-11 DIAGNOSIS — R0602 Shortness of breath: Secondary | ICD-10-CM | POA: Diagnosis not present

## 2019-01-11 DIAGNOSIS — K922 Gastrointestinal hemorrhage, unspecified: Secondary | ICD-10-CM | POA: Diagnosis not present

## 2019-01-11 DIAGNOSIS — R0682 Tachypnea, not elsewhere classified: Secondary | ICD-10-CM

## 2019-01-11 DIAGNOSIS — Y95 Nosocomial condition: Secondary | ICD-10-CM | POA: Diagnosis present

## 2019-01-11 DIAGNOSIS — U071 COVID-19: Secondary | ICD-10-CM | POA: Diagnosis present

## 2019-01-11 DIAGNOSIS — A4189 Other specified sepsis: Principal | ICD-10-CM | POA: Diagnosis present

## 2019-01-11 DIAGNOSIS — R000 Tachycardia, unspecified: Secondary | ICD-10-CM | POA: Diagnosis not present

## 2019-01-11 DIAGNOSIS — Z515 Encounter for palliative care: Secondary | ICD-10-CM

## 2019-01-11 DIAGNOSIS — Z85528 Personal history of other malignant neoplasm of kidney: Secondary | ICD-10-CM

## 2019-01-11 DIAGNOSIS — B9562 Methicillin resistant Staphylococcus aureus infection as the cause of diseases classified elsewhere: Secondary | ICD-10-CM | POA: Diagnosis present

## 2019-01-11 DIAGNOSIS — I10 Essential (primary) hypertension: Secondary | ICD-10-CM | POA: Diagnosis present

## 2019-01-11 DIAGNOSIS — N179 Acute kidney failure, unspecified: Secondary | ICD-10-CM | POA: Diagnosis present

## 2019-01-11 DIAGNOSIS — R739 Hyperglycemia, unspecified: Secondary | ICD-10-CM | POA: Diagnosis not present

## 2019-01-11 DIAGNOSIS — I071 Rheumatic tricuspid insufficiency: Secondary | ICD-10-CM | POA: Diagnosis present

## 2019-01-11 DIAGNOSIS — K921 Melena: Secondary | ICD-10-CM | POA: Diagnosis not present

## 2019-01-11 DIAGNOSIS — J181 Lobar pneumonia, unspecified organism: Secondary | ICD-10-CM | POA: Diagnosis not present

## 2019-01-11 LAB — POCT I-STAT 7, (LYTES, BLD GAS, ICA,H+H)
Acid-base deficit: 6 mmol/L — ABNORMAL HIGH (ref 0.0–2.0)
Bicarbonate: 16.3 mmol/L — ABNORMAL LOW (ref 20.0–28.0)
Calcium, Ion: 1.18 mmol/L (ref 1.15–1.40)
HCT: 29 % — ABNORMAL LOW (ref 39.0–52.0)
Hemoglobin: 9.9 g/dL — ABNORMAL LOW (ref 13.0–17.0)
O2 Saturation: 98 %
Patient temperature: 99
Potassium: 4.5 mmol/L (ref 3.5–5.1)
Sodium: 155 mmol/L — ABNORMAL HIGH (ref 135–145)
TCO2: 17 mmol/L — ABNORMAL LOW (ref 22–32)
pCO2 arterial: 23.1 mmHg — ABNORMAL LOW (ref 32.0–48.0)
pH, Arterial: 7.455 — ABNORMAL HIGH (ref 7.350–7.450)
pO2, Arterial: 95 mmHg (ref 83.0–108.0)

## 2019-01-11 LAB — TRIGLYCERIDES: Triglycerides: 110 mg/dL (ref <150)

## 2019-01-11 LAB — C-REACTIVE PROTEIN
CRP: 26.3 mg/dL — ABNORMAL HIGH (ref <1.0)
CRP: 20.8 mg/dL — ABNORMAL HIGH (ref <1.0)

## 2019-01-11 LAB — COMPREHENSIVE METABOLIC PANEL
ALT: 22 U/L (ref 0–44)
AST: 30 U/L (ref 15–41)
Albumin: 2.1 g/dL — ABNORMAL LOW (ref 3.5–5.0)
Alkaline Phosphatase: 49 U/L (ref 38–126)
Anion gap: 12 (ref 5–15)
BUN: 87 mg/dL — ABNORMAL HIGH (ref 8–23)
CO2: 16 mmol/L — ABNORMAL LOW (ref 22–32)
Calcium: 8.4 mg/dL — ABNORMAL LOW (ref 8.9–10.3)
Chloride: 125 mmol/L — ABNORMAL HIGH (ref 98–111)
Creatinine, Ser: 6.6 mg/dL — ABNORMAL HIGH (ref 0.61–1.24)
GFR calc Af Amer: 9 mL/min — ABNORMAL LOW (ref >60)
GFR calc non Af Amer: 8 mL/min — ABNORMAL LOW (ref >60)
Glucose, Bld: 87 mg/dL (ref 70–99)
Potassium: 4.8 mmol/L (ref 3.5–5.1)
Sodium: 153 mmol/L — ABNORMAL HIGH (ref 135–145)
Total Bilirubin: 3.6 mg/dL — ABNORMAL HIGH (ref 0.3–1.2)
Total Protein: 7.8 g/dL (ref 6.5–8.1)

## 2019-01-11 LAB — CBC WITH DIFFERENTIAL/PLATELET
Abs Immature Granulocytes: 0.21 10*3/uL — ABNORMAL HIGH (ref 0.00–0.07)
Basophils Absolute: 0 10*3/uL (ref 0.0–0.1)
Basophils Relative: 0 %
Eosinophils Absolute: 0.2 10*3/uL (ref 0.0–0.5)
Eosinophils Relative: 1 %
HCT: 34.2 % — ABNORMAL LOW (ref 39.0–52.0)
Hemoglobin: 11 g/dL — ABNORMAL LOW (ref 13.0–17.0)
Immature Granulocytes: 2 %
Lymphocytes Relative: 12 %
Lymphs Abs: 1.6 10*3/uL (ref 0.7–4.0)
MCH: 29.1 pg (ref 26.0–34.0)
MCHC: 32.2 g/dL (ref 30.0–36.0)
MCV: 90.5 fL (ref 80.0–100.0)
Monocytes Absolute: 1 10*3/uL (ref 0.1–1.0)
Monocytes Relative: 8 %
Neutro Abs: 9.9 10*3/uL — ABNORMAL HIGH (ref 1.7–7.7)
Neutrophils Relative %: 77 %
Platelets: 192 10*3/uL (ref 150–400)
RBC: 3.78 MIL/uL — ABNORMAL LOW (ref 4.22–5.81)
RDW: 17.2 % — ABNORMAL HIGH (ref 11.5–15.5)
WBC: 12.9 10*3/uL — ABNORMAL HIGH (ref 4.0–10.5)
nRBC: 0 % (ref 0.0–0.2)

## 2019-01-11 LAB — URINALYSIS, ROUTINE W REFLEX MICROSCOPIC
Bilirubin Urine: NEGATIVE
Glucose, UA: 50 mg/dL — AB
Ketones, ur: NEGATIVE mg/dL
Leukocytes,Ua: NEGATIVE
Nitrite: NEGATIVE
Protein, ur: 100 mg/dL — AB
Specific Gravity, Urine: 1.013 (ref 1.005–1.030)
pH: 5 (ref 5.0–8.0)

## 2019-01-11 LAB — APTT: aPTT: 37 seconds — ABNORMAL HIGH (ref 24–36)

## 2019-01-11 LAB — LACTIC ACID, PLASMA
Lactic Acid, Venous: 2.3 mmol/L (ref 0.5–1.9)
Lactic Acid, Venous: 1 mmol/L (ref 0.5–1.9)
Lactic Acid, Venous: 1.7 mmol/L (ref 0.5–1.9)

## 2019-01-11 LAB — PROCALCITONIN: Procalcitonin: 43.95 ng/mL

## 2019-01-11 LAB — PROTIME-INR
INR: 1.5 — ABNORMAL HIGH (ref 0.8–1.2)
Prothrombin Time: 17.8 seconds — ABNORMAL HIGH (ref 11.4–15.2)

## 2019-01-11 LAB — FERRITIN: Ferritin: 5481 ng/mL — ABNORMAL HIGH (ref 24–336)

## 2019-01-11 LAB — D-DIMER, QUANTITATIVE
D-Dimer, Quant: 2.68 ug/mL-FEU — ABNORMAL HIGH (ref 0.00–0.50)
D-Dimer, Quant: 2.09 ug/mL-FEU — ABNORMAL HIGH (ref 0.00–0.50)

## 2019-01-11 LAB — CBG MONITORING, ED: Glucose-Capillary: 72 mg/dL (ref 70–99)

## 2019-01-11 LAB — FIBRINOGEN: Fibrinogen: >800 mg/dL — ABNORMAL HIGH (ref 210–475)

## 2019-01-11 LAB — LACTATE DEHYDROGENASE: LDH: 259 U/L — ABNORMAL HIGH (ref 98–192)

## 2019-01-11 MED ORDER — HYDROCORTISONE NA SUCCINATE PF 100 MG IJ SOLR
50.0000 mg | Freq: Four times a day (QID) | INTRAMUSCULAR | Status: DC
  Administered 2019-01-11 – 2019-01-17 (×23): 50 mg via INTRAVENOUS
  Filled 2019-01-11 (×23): qty 2

## 2019-01-11 MED ORDER — SODIUM BICARBONATE-DEXTROSE 150-5 MEQ/L-% IV SOLN
150.0000 meq | INTRAVENOUS | Status: DC
  Administered 2019-01-11 – 2019-01-12 (×4): 150 meq via INTRAVENOUS
  Filled 2019-01-11 (×4): qty 1000

## 2019-01-11 MED ORDER — BISACODYL 5 MG PO TBEC: 5.0000 mg | DELAYED_RELEASE_TABLET | Freq: Every day | ORAL | Status: DC | PRN

## 2019-01-11 MED ORDER — SODIUM CHLORIDE 0.9 % IV SOLN
2.0000 g | Freq: Once | INTRAVENOUS | Status: AC
  Administered 2019-01-11: 09:00:00 2 g via INTRAVENOUS
  Filled 2019-01-11: qty 2

## 2019-01-11 MED ORDER — HYDRALAZINE HCL 20 MG/ML IJ SOLN
5.0000 mg | INTRAMUSCULAR | Status: DC | PRN
  Administered 2019-01-12 – 2019-01-22 (×4): 5 mg via INTRAVENOUS
  Filled 2019-01-11 (×4): qty 1

## 2019-01-11 MED ORDER — IPRATROPIUM-ALBUTEROL 20-100 MCG/ACT IN AERS
1.0000 | INHALATION_SPRAY | Freq: Four times a day (QID) | RESPIRATORY_TRACT | Status: DC
  Administered 2019-01-12 – 2019-01-20 (×26): 1 via RESPIRATORY_TRACT
  Filled 2019-01-11 (×2): qty 4

## 2019-01-11 MED ORDER — ACETAMINOPHEN 650 MG RE SUPP
650.0000 mg | Freq: Once | RECTAL | Status: AC
  Administered 2019-01-11: 08:00:00 650 mg via RECTAL
  Filled 2019-01-11: qty 1

## 2019-01-11 MED ORDER — ASPIRIN EC 81 MG PO TBEC
81.0000 mg | DELAYED_RELEASE_TABLET | Freq: Every day | ORAL | Status: DC
  Administered 2019-01-12 – 2019-01-23 (×12): 81 mg via ORAL
  Filled 2019-01-11 (×12): qty 1

## 2019-01-11 MED ORDER — MYCOPHENOLATE MOFETIL 250 MG PO CAPS
500.0000 mg | ORAL_CAPSULE | Freq: Two times a day (BID) | ORAL | Status: DC
  Administered 2019-01-12 – 2019-01-23 (×22): 500 mg via ORAL
  Filled 2019-01-11 (×25): qty 2

## 2019-01-11 MED ORDER — SODIUM BICARBONATE 8.4 % IV SOLN: INTRAVENOUS | Status: DC

## 2019-01-11 MED ORDER — ONDANSETRON HCL 4 MG/2ML IJ SOLN: 4.0000 mg | Freq: Four times a day (QID) | INTRAMUSCULAR | Status: DC | PRN

## 2019-01-11 MED ORDER — SODIUM CHLORIDE 0.9% FLUSH
3.0000 mL | Freq: Two times a day (BID) | INTRAVENOUS | Status: DC
  Administered 2019-01-12 – 2019-02-06 (×48): 3 mL via INTRAVENOUS

## 2019-01-11 MED ORDER — SODIUM CHLORIDE 0.9% FLUSH: 3.0000 mL | INTRAVENOUS | Status: DC | PRN

## 2019-01-11 MED ORDER — DOCUSATE SODIUM 283 MG RE ENEM
1.0000 | ENEMA | RECTAL | Status: DC | PRN
  Filled 2019-01-11: qty 1

## 2019-01-11 MED ORDER — SODIUM CHLORIDE 0.9% FLUSH: 3.0000 mL | Freq: Two times a day (BID) | INTRAVENOUS | Status: DC

## 2019-01-11 MED ORDER — POLYETHYLENE GLYCOL 3350 17 G PO PACK: 17.0000 g | PACK | Freq: Every day | ORAL | Status: DC | PRN

## 2019-01-11 MED ORDER — ONDANSETRON HCL 4 MG PO TABS: 4.0000 mg | ORAL_TABLET | Freq: Four times a day (QID) | ORAL | Status: DC | PRN

## 2019-01-11 MED ORDER — LABETALOL HCL 5 MG/ML IV SOLN
5.0000 mg | Freq: Four times a day (QID) | INTRAVENOUS | Status: DC
  Administered 2019-01-11 – 2019-01-13 (×9): 5 mg via INTRAVENOUS
  Filled 2019-01-11 (×8): qty 4

## 2019-01-11 MED ORDER — VANCOMYCIN HCL IN DEXTROSE 1-5 GM/200ML-% IV SOLN
1000.0000 mg | Freq: Once | INTRAVENOUS | Status: DC
  Filled 2019-01-11: qty 200

## 2019-01-11 MED ORDER — NEPRO/CARBSTEADY PO LIQD
237.0000 mL | Freq: Three times a day (TID) | ORAL | Status: DC | PRN
  Filled 2019-01-11: qty 237

## 2019-01-11 MED ORDER — SODIUM CHLORIDE 0.9 % IV BOLUS
1000.0000 mL | Freq: Once | INTRAVENOUS | Status: AC
  Administered 2019-01-11: 09:00:00 1000 mL via INTRAVENOUS

## 2019-01-11 MED ORDER — SODIUM CHLORIDE 0.9 % IV SOLN: INTRAVENOUS | Status: DC

## 2019-01-11 MED ORDER — DOCUSATE SODIUM 100 MG PO CAPS
100.0000 mg | ORAL_CAPSULE | Freq: Two times a day (BID) | ORAL | Status: DC
  Administered 2019-01-12 – 2019-01-17 (×7): 100 mg via ORAL
  Filled 2019-01-11 (×10): qty 1

## 2019-01-11 MED ORDER — CAMPHOR-MENTHOL 0.5-0.5 % EX LOTN
1.0000 "application " | TOPICAL_LOTION | Freq: Three times a day (TID) | CUTANEOUS | Status: DC | PRN
  Filled 2019-01-11: qty 222

## 2019-01-11 MED ORDER — SORBITOL 70 % SOLN
30.0000 mL | Status: DC | PRN
  Filled 2019-01-11: qty 30

## 2019-01-11 MED ORDER — CALCIUM CARBONATE ANTACID 1250 MG/5ML PO SUSP
500.0000 mg | Freq: Four times a day (QID) | ORAL | Status: DC | PRN
  Filled 2019-01-11: qty 5

## 2019-01-11 MED ORDER — ACETAMINOPHEN 325 MG PO TABS
650.0000 mg | ORAL_TABLET | Freq: Four times a day (QID) | ORAL | Status: DC | PRN
  Administered 2019-01-16: 05:00:00 650 mg via ORAL
  Filled 2019-01-11 (×2): qty 2

## 2019-01-11 MED ORDER — HYDROXYZINE HCL 25 MG PO TABS
25.0000 mg | ORAL_TABLET | Freq: Three times a day (TID) | ORAL | Status: DC | PRN
  Filled 2019-01-11: qty 1

## 2019-01-11 MED ORDER — HEPARIN SODIUM (PORCINE) 5000 UNIT/ML IJ SOLN
5000.0000 [IU] | Freq: Three times a day (TID) | INTRAMUSCULAR | Status: DC
  Administered 2019-01-11 – 2019-01-13 (×7): 5000 [IU] via SUBCUTANEOUS
  Filled 2019-01-11 (×6): qty 1

## 2019-01-11 MED ORDER — ZOLPIDEM TARTRATE 5 MG PO TABS: 5.0000 mg | ORAL_TABLET | Freq: Every evening | ORAL | Status: DC | PRN

## 2019-01-11 MED ORDER — SODIUM CHLORIDE 0.9 % IV SOLN: 250.0000 mL | INTRAVENOUS | Status: DC | PRN

## 2019-01-11 MED ORDER — SODIUM CHLORIDE 0.9 % IV BOLUS
500.0000 mL | Freq: Once | INTRAVENOUS | Status: AC
  Administered 2019-01-11: 12:00:00 500 mL via INTRAVENOUS

## 2019-01-11 MED ORDER — TACROLIMUS 1 MG PO CAPS
4.0000 mg | ORAL_CAPSULE | Freq: Two times a day (BID) | ORAL | Status: DC
  Administered 2019-01-12 – 2019-02-06 (×48): 4 mg via ORAL
  Filled 2019-01-11 (×56): qty 4

## 2019-01-11 MED ORDER — OXYCODONE HCL 5 MG PO TABS
5.0000 mg | ORAL_TABLET | ORAL | Status: DC | PRN
  Administered 2019-01-11 – 2019-01-14 (×3): 5 mg via ORAL
  Filled 2019-01-11 (×3): qty 1

## 2019-01-11 MED ORDER — SODIUM CHLORIDE 0.9 % IV SOLN
1.0000 g | INTRAVENOUS | Status: AC
  Administered 2019-01-12 – 2019-01-17 (×6): 1 g via INTRAVENOUS
  Filled 2019-01-11 (×9): qty 1

## 2019-01-11 NOTE — ED Provider Notes (Signed)
[facility] EMERGENCY DEPARTMENT Provider Note   CSN: [id number] Arrival date & time: 01/11/19  0745     History   Chief Complaint Chief Complaint  Patient presents with   Altered Mental Status   Shortness of Breath    HPI Francisco Williamson is a 73 y.o. male.     Patient is a 73 year old male with multiple medical problems including chronic kidney disease status post renal transplant, hypertension, recent hospitalization and discharged 5 days ago after having Covid pneumonia and acute kidney distress injury who is presenting today from home with confusion.  When paramedics arrived patient's oxygen sats were in the 80s.  It was reported by wife that Francisco Williamson seemed normal yesterday but when she went to wake him up this morning Francisco Williamson was groggy and confused.  There was no report of new cough, nausea, vomiting.  Patient did undergo a course of remdesivir and Decadron while hospitalized with good response.  Francisco Williamson is currently on prednisone and his immunosuppressive medications.  The history is provided by the patient.  Altered Mental Status Presenting symptoms: disorientation and lethargy   Severity:  Moderate Most recent episode:  Today Episode history:  Continuous Timing:  Constant Progression:  Waxing and waning Chronicity:  New Shortness of Breath   Past Medical History:  Diagnosis Date   Hypertension    Renal disorder     Patient Active Problem List   Diagnosis Date Noted   Acute respiratory failure with hypoxia ([place]) 01/05/2019   Pneumonia due to COVID-19 virus 01/01/2019   CAP (community acquired pneumonia) 12/31/2018   CKD stage 5 secondary to hypertension ([place]) 12/31/2018   Hyperkalemia 12/31/2018   Anemia in chronic kidney disease 09/29/2015    Past Surgical History:  Procedure Laterality Date   NEPHRECTOMY TRANSPLANTED ORGAN          Home Medications    Prior to Admission medications   Medication Sig Start Date End Date Taking?  Authorizing Provider  acetaminophen (TYLENOL) 325 MG tablet Take 2 tablets (650 mg total) by mouth every 6 (six) hours as needed for mild pain or headache (fever >/= 101). 01/06/19   [name], MD  amLODipine (NORVASC) 10 MG tablet Take 10 mg by mouth daily. 06/17/14   [provider]  aspirin EC 81 MG tablet Take 81 mg by mouth daily.    [provider]  calcitRIOL (ROCALTROL) 0.5 MCG capsule Take 0.5 mcg by mouth daily. 06/17/14   [provider]  cloNIDine (CATAPRES) 0.2 MG tablet Take 1 tablet (0.2 mg total) by mouth 2 (two) times daily. 01/06/19   [name], MD  Darbepoetin Alfa (ARANESP) 300 MCG/0.6ML SOSY injection Inject 300 mcg into the skin every 7 (seven) days.    [provider]  ferrous sulfate 325 (65 FE) MG EC tablet Take 325 mg by mouth 2 (two) times daily.    [provider]  Ipratropium-Albuterol (COMBIVENT) 20-100 MCG/ACT AERS respimat Inhale 1 puff into the lungs every 6 (six) hours. 01/06/19   [name], MD  labetalol (NORMODYNE) 300 MG tablet Take 1 tablet (300 mg total) by mouth 3 (three) times daily. 01/06/19   [name], MD  ondansetron (ZOFRAN) 4 MG tablet Take 1 tablet (4 mg total) by mouth every 6 (six) hours as needed for nausea. 01/06/19   [name], MD  PREDNISONE PO Take 5 mg by mouth 2 (two) times daily.     [provider]  sildenafil (VIAGRA)  100 MG tablet Take 100 mg by mouth as needed for erectile dysfunction.    [provider]  sodium bicarbonate 650 MG tablet Take 650 mg by mouth 3 (three) times daily.    [provider]  sulfamethoxazole-trimethoprim (BACTRIM,SEPTRA) 400-80 MG per tablet Take 1 tablet by mouth every Monday, Wednesday, and Friday. Take one tablet by mouth on Monday, Wednesday and fridays. 11/21/18   [provider]  tacrolimus (PROGRAF) 1 MG capsule Take 4 mg by mouth 2 (two) times daily.    [provider]  traZODone (DESYREL) 50 MG  tablet Take 0.5 tablets (25 mg total) by mouth at bedtime as needed for sleep. 01/06/19   [name], MD  vitamin C (VITAMIN C) 500 MG tablet Take 1 tablet (500 mg total) by mouth daily. 01/07/19   [name], MD  zinc sulfate 220 (50 Zn) MG capsule Take 1 capsule (220 mg total) by mouth daily. 01/07/19   [name], MD    Family History No family history on file.  Social History Social History   Tobacco Use   Smoking status: Never Smoker   Smokeless tobacco: Never Used  Substance Use Topics   Alcohol use: No   Drug use: No     Allergies   Patient has no known allergies.   Review of Systems Review of Systems  Respiratory: Positive for shortness of breath.   All other systems reviewed and are negative.    Physical Exam Updated Vital Signs BP 137/85 (BP Location: Right Arm)    Pulse (!) 118    Temp (!) 101.4 F (38.6 C) (Oral)    Resp (!) 36    SpO2 92%   Physical Exam Vitals signs and nursing note reviewed.  Constitutional:      General: Francisco Williamson is not in acute distress.    Appearance: Francisco Williamson is well-developed and underweight. Francisco Williamson is ill-appearing.     Comments: shivering  HENT:     Head: Normocephalic and atraumatic.     Mouth/Throat:     Mouth: Mucous membranes are dry.     Comments: Small white plaques present on the upper palate and small papules Eyes:     Conjunctiva/sclera: Conjunctivae normal.     Pupils: Pupils are equal, round, and reactive to light.     Comments: Left eye unresponsive.  Right eye is 6mm and sluggish  Neck:     Musculoskeletal: Normal range of motion and neck supple.  Cardiovascular:     Rate and Rhythm: Regular rhythm. Tachycardia present.     Heart sounds: Murmur present.     Comments: Loud bruit heard from left AV graft Pulmonary:     Effort: Pulmonary effort is normal. Tachypnea present. No respiratory distress.     Breath sounds: Rhonchi present. No wheezing or rales.  Abdominal:     General: There is no distension.      Palpations: Abdomen is soft.     Tenderness: There is no abdominal tenderness. There is no guarding or rebound.  Musculoskeletal: Normal range of motion.        General: No tenderness.     Comments: Left AV fistula with palpable thrill  Skin:    General: Skin is warm and dry.     Findings: No erythema or rash.  Neurological:     Mental Status: Francisco Williamson is lethargic.     Comments: Pt is lethargic but will awake with voice.  Can follow commands and moves all extremities  Psychiatric:     Comments: sluggish      ED Treatments / Results  Labs (all labs ordered are listed, but only abnormal results are displayed) Labs Reviewed  LACTIC ACID, PLASMA - Abnormal; Notable for the following components:      Result Value   Lactic Acid, Venous 2.3 (*)    All other components within normal limits  CBC WITH DIFFERENTIAL/PLATELET - Abnormal; Notable for the following components:   WBC 12.9 (*)    RBC 3.78 (*)    Hemoglobin 11.0 (*)    HCT 34.2 (*)    RDW 17.2 (*)    Neutro Abs 9.9 (*)    Abs Immature Granulocytes 0.21 (*)    All other components within normal limits  COMPREHENSIVE METABOLIC PANEL - Abnormal; Notable for the following components:   Sodium 153 (*)    Chloride 125 (*)    CO2 16 (*)    BUN 87 (*)    Creatinine, Ser 6.60 (*)    Calcium 8.4 (*)    Albumin 2.1 (*)    Total Bilirubin 3.6 (*)    GFR calc non Af Amer 8 (*)    GFR calc Af Amer 9 (*)    All other components within normal limits  D-DIMER, QUANTITATIVE (NOT AT [facility]) - Abnormal; Notable for the following components:   D-Dimer, Quant 2.68 (*)    All other components within normal limits  LACTATE DEHYDROGENASE - Abnormal; Notable for the following components:   LDH 259 (*)    All other components within normal limits  FIBRINOGEN - Abnormal; Notable for the following components:   Fibrinogen >800 (*)    All other components within normal limits  APTT - Abnormal; Notable for the following components:   aPTT 37 (*)     All other components within normal limits  PROTIME-INR - Abnormal; Notable for the following components:   Prothrombin Time 17.8 (*)    INR 1.5 (*)    All other components within normal limits  POCT I-STAT 7, (LYTES, BLD GAS, ICA,H+H) - Abnormal; Notable for the following components:   pH, Arterial 7.455 (*)    pCO2 arterial 23.1 (*)    Bicarbonate 16.3 (*)    TCO2 17 (*)    Acid-base deficit 6.0 (*)    Sodium 155 (*)    HCT 29.0 (*)    Hemoglobin 9.9 (*)    All other components within normal limits  CULTURE, BLOOD (ROUTINE X 2)  CULTURE, BLOOD (ROUTINE X 2)  TRIGLYCERIDES  LACTIC ACID, PLASMA  PROCALCITONIN  FERRITIN  C-REACTIVE PROTEIN  BLOOD GAS, ARTERIAL  URINALYSIS, ROUTINE W REFLEX MICROSCOPIC  CBG MONITORING, ED    EKG EKG Interpretation  Date/Time:  Sunday January 11 2019 07:52:27 EST Ventricular Rate:  121 PR Interval:    QRS Duration: 66 QT Interval:  304 QTC Calculation: 432 R Axis:   43 Text Interpretation: Sinus tachycardia LVH with secondary repolarization abnormality Artifact in lead(s) I II III V1 V2 V3 , new T wave inversion Anterolateral leads Confirmed by Blanchie Dessert [phone]) on 01/11/2019 9:03:48 AM   Radiology Dg Chest Port 1 View  Result Date: 01/11/2019 CLINICAL DATA:  Altered mental status EXAM: PORTABLE CHEST 1 VIEW COMPARISON:  Chest radiograph 12/31/2018 FINDINGS: Monitoring leads overlie the patient. Stable cardiac and mediastinal contours. Interval development of bilateral patchy consolidative opacities. No pleural effusion or pneumothorax. IMPRESSION: Bilateral patchy areas of consolidation may represent multifocal pneumonia or atypical/viral infectious process. Electronically Signed  By: Lovey Newcomer M.D.   On: 01/11/2019 08:49    Procedures Procedures (including critical care time)  Medications Ordered in ED Medications  acetaminophen (TYLENOL) suppository 650 mg (has no administration in time range)     Initial Impression /  Assessment and Plan / ED Course  I have reviewed the triage vital signs and the nursing notes.  Pertinent labs & imaging results that were available during my care of the patient were reviewed by me and considered in my medical decision making (see chart for details).       Elderly male with multiple medical problems and recent Covid pneumonia who is presenting from home after discharge 5 days ago after remdesivir and steroids with altered mental status and hypoxia.  Patient's oxygen saturation did improve with nonrebreather and then was satting 88 to 90% on room air.  Patient was placed on 2 L with oxygen sats in the low 90s.  Patient is febrile and tachycardic today with coarse breath sounds.  Concern for now secondary bacterial pneumonia versus persistent Covid versus septicemia.  Patient does have a renal transplant and is immunosuppressed.  Francisco Williamson did have AKI on his recent hospitalization with improvement of renal function prior to discharge.  Francisco Williamson has been taking immunosuppressive medication at home.  Concern for worsening AKI as well.  Patient had code sepsis initiated as well as broad-spectrum antibiotics per pharmacy.  Judicious fluids at this time until further labs have resulted.  9:20 AM Managing is consistent with ongoing multifocal pneumonia, lactate mildly elevated at 2.3, blood gas shows a mild respiratory alkalosis and metabolic acidosis but no evidence of anion gap.  Patient has new AKI.  Francisco Williamson has chronic kidney disease with his renal transplant with normal creatinines between 2 and 3 and upon leaving [facility] his creatinine was 3.5.  Today patient's creatinine is 6.6 with elevated BUN and a sodium of 153.  Patient CBC with mild leukocytosis of 13,000 and stable hemoglobin.  D-dimer, LDH, fibrinogen and PT/PTT are all elevated.  Patient was covered with cefepime however given new renal failure pharmacy did not feel vancomycin was the right choice and was not sure if patient should be  covered with Zyvox.  Patient was given IV bolus due to the dehydration we will plan on admitting for further care.  His respiratory status remained stable on 2 L Francisco Williamson is satting in the mid 90s, heart rate is improving with fever control and blood pressure remained stable.  CRITICAL CARE Performed by: Doniven Vanpatten Total critical care time: 30 minutes Critical care time was exclusive of separately billable procedures and treating other patients. Critical care was necessary to treat or prevent imminent or life-threatening deterioration. Critical care was time spent personally by me on the following activities: development of treatment plan with patient and/or surrogate as well as nursing, discussions with consultants, evaluation of patient's response to treatment, examination of patient, obtaining history from patient or surrogate, ordering and performing treatments and interventions, ordering and review of laboratory studies, ordering and review of radiographic studies, pulse oximetry and re-evaluation of patient's condition.  Simcha Knackstedt was evaluated in Emergency Department on 01/11/2019 for the symptoms described in the history of present illness. Francisco Williamson was evaluated in the context of the global COVID-19 pandemic, which necessitated consideration that the patient might be at risk for infection with the SARS-CoV-2 virus that causes COVID-19. Institutional protocols and algorithms that pertain to the evaluation of patients at risk for COVID-19 are in a state of  rapid change based on information released by regulatory bodies including the CDC and federal and state organizations. These policies and algorithms were followed during the patient's care in the ED.   Final Clinical Impressions(s) / ED Diagnoses   Final diagnoses:  Pneumonia due to COVID-19 virus  Hypernatremia  Dehydration  Acute kidney injury [facility])    ED Discharge Orders    None       Blanchie Dessert, MD 01/11/19 [phone]

## 2019-01-11 NOTE — ED Triage Notes (Signed)
Pt arrived by EMS with complaints of AMS. PER EMS report pt was difficult to arouse this am and confused. Pt A/O X2 self /place. Pt SPO2 84% and placed on NRB with improvement to 99% 133/70 124 HR 38 RR 99% NRB T: 103  Given 100 NS en route

## 2019-01-11 NOTE — H&P (Signed)
History and Physical    Francisco Williamson OZD:[id number] DOB: February 17, 1946 DOA: 01/11/2019  PCP: Default, Provider, MD Consultants:  [name] - nephrology Patient coming from:  Home - lives alone; NOK:  Lavone Orn, [phone]; Niece, Kerin Perna, [phone]  Chief Complaint: AMS  HPI: Rolly Magri is a 73 y.o. male with medical history significant of HTN and stage V CKD s/p renal transplantation presenting with AMS.  He was admitted to [facility] from 10/28-11/3 for COVID-19-associated PNA with acute respiratory failure with hypoxia.  He completed a course of Remdesivir and steroids.  He is unable to provide history at this time.  I spoke with his niece, closest living relative.  Daughter lives in [place].  He was discharged from [facility] and "his needs were not met" - he didn't eat any of the food she was taking and he didn't take his medications.  He was taking longer and longer to get to the door, he hadn't eaten anything.  She has to "hold myself accountable", feels badly that she listened to his elderly girlfriend and feels like he needs a chance, wants to try to make him better with IVF, antibiotics.  She now understands that he will not be able to be discharged to home at this time if he survives and will need placement.     ED Course:  Recent d/c from [facility], treated with Remdesivir/Decadron with improvement.  Had AKI, creatinine 4 (up from 2-3), went home on RA.  Unable to reach family - per EMS, difficult to wake up and confused this AM.  82% on RA, febrile.  Now with new AKI, creatinine 6.6, Na++ 153 - likely hypovolemic.  BP stable, 150-160.  HR down to 100.  On 2L [place] O2 with good O2 sats.  Given Cefepime, still multifocal PNA.  No Vanc due to renal dysfunction.  Blood cultures are pending.  Follows commands, ?thrush.  Review of Systems: Unable to perforrm   Past Medical History:  Diagnosis Date  . Hypertension   . Renal disorder     Past Surgical History:  Procedure  Laterality Date  . NEPHRECTOMY TRANSPLANTED ORGAN      Social History   Socioeconomic History  . Marital status: Single    Spouse name: Not on file  . Number of children: Not on file  . Years of education: Not on file  . Highest education level: Not on file  Occupational History  . Not on file  Social Needs  . Financial resource strain: Not on file  . Food insecurity    Worry: Not on file    Inability: Not on file  . Transportation needs    Medical: Not on file    Non-medical: Not on file  Tobacco Use  . Smoking status: Never Smoker  . Smokeless tobacco: Never Used  Substance and Sexual Activity  . Alcohol use: No  . Drug use: No  . Sexual activity: Not on file  Lifestyle  . Physical activity    Days per week: Not on file    Minutes per session: Not on file  . Stress: Not on file  Relationships  . Social Herbalist on phone: Not on file    Gets together: Not on file    Attends religious service: Not on file    Active member of club or organization: Not on file    Attends meetings of clubs or organizations: Not on file    Relationship status: Not on file  .  Intimate partner violence    Fear of current or ex partner: Not on file    Emotionally abused: Not on file    Physically abused: Not on file    Forced sexual activity: Not on file  Other Topics Concern  . Not on file  Social History Narrative  . Not on file    No Active Allergies  No family history on file.  Prior to Admission medications   Medication Sig Start Date End Date Taking? Authorizing Provider  acetaminophen (TYLENOL) 325 MG tablet Take 2 tablets (650 mg total) by mouth every 6 (six) hours as needed for mild pain or headache (fever >/= 101). 01/06/19   [name], MD  amLODipine (NORVASC) 10 MG tablet Take 10 mg by mouth daily. 06/17/14   [provider]  aspirin EC 81 MG tablet Take 81 mg by mouth daily.    [provider]  calcitRIOL (ROCALTROL) 0.5 MCG capsule  Take 0.5 mcg by mouth daily. 06/17/14   [provider]  cloNIDine (CATAPRES) 0.2 MG tablet Take 1 tablet (0.2 mg total) by mouth 2 (two) times daily. 01/06/19   [name], MD  Darbepoetin Alfa (ARANESP) 300 MCG/0.6ML SOSY injection Inject 300 mcg into the skin every 7 (seven) days.    [provider]  ferrous sulfate 325 (65 FE) MG EC tablet Take 325 mg by mouth 2 (two) times daily.    [provider]  Ipratropium-Albuterol (COMBIVENT) 20-100 MCG/ACT AERS respimat Inhale 1 puff into the lungs every 6 (six) hours. 01/06/19   [name], MD  labetalol (NORMODYNE) 300 MG tablet Take 1 tablet (300 mg total) by mouth 3 (three) times daily. 01/06/19   [name], MD  ondansetron (ZOFRAN) 4 MG tablet Take 1 tablet (4 mg total) by mouth every 6 (six) hours as needed for nausea. 01/06/19   [name], MD  PREDNISONE PO Take 5 mg by mouth 2 (two) times daily.     [provider]  sildenafil (VIAGRA) 100 MG tablet Take 100 mg by mouth as needed for erectile dysfunction.    [provider]  sodium bicarbonate 650 MG tablet Take 650 mg by mouth 3 (three) times daily.    [provider]  sulfamethoxazole-trimethoprim (BACTRIM,SEPTRA) 400-80 MG per tablet Take 1 tablet by mouth every Monday, Wednesday, and Friday. Take one tablet by mouth on Monday, Wednesday and fridays. 11/21/18   [provider]  tacrolimus (PROGRAF) 1 MG capsule Take 4 mg by mouth 2 (two) times daily.    [provider]  traZODone (DESYREL) 50 MG tablet Take 0.5 tablets (25 mg total) by mouth at bedtime as needed for sleep. 01/06/19   [name], MD  vitamin C (VITAMIN C) 500 MG tablet Take 1 tablet (500 mg total) by mouth daily. 01/07/19   [name], MD  zinc sulfate 220 (50 Zn) MG capsule Take 1 capsule (220 mg total) by mouth daily. 01/07/19   [name], MD    Physical Exam: Vitals:   01/11/19 1115 01/11/19 1130 01/11/19 1145 01/11/19  1227  BP: (!) 174/69 (!) 165/51 (!) 162/52   Pulse:      Resp: (!) 28 18 (!) 29   Temp:    98.4 F (36.9 C)  TempSrc:    Oral  SpO2:         . General:  Appears very ill; opens right eye to voice, nonsensical speech possibly related to severe  dehydration and difficult forming words . Eyes:  Opens only R eye . ENT:  grossly normal hearing, lips & tongue, extremely dry mm . Neck:  no LAD, masses or thyromegaly . Cardiovascular:  RR with tachycardia to 120s, no m/r/g. . Respiratory:   Scattered rhonchi.  Increased respiratory effort. . Abdomen:  soft, NT, ND, NABS . Skin:  no rash or induration seen on limited exam . Musculoskeletal:  grossly normal tone BUE/BLE, good ROM, no bony abnormality . Psychiatric:  Opens eyes to voice, minimally verbally responsive with speech that is garbled - possibly from severely dry mucus membranes . Neurologic:  Unable to perform    Radiological Exams on Admission: Dg Chest Port 1 View  Result Date: 01/11/2019 CLINICAL DATA:  Altered mental status EXAM: PORTABLE CHEST 1 VIEW COMPARISON:  Chest radiograph 12/31/2018 FINDINGS: Monitoring leads overlie the patient. Stable cardiac and mediastinal contours. Interval development of bilateral patchy consolidative opacities. No pleural effusion or pneumothorax. IMPRESSION: Bilateral patchy areas of consolidation may represent multifocal pneumonia or atypical/viral infectious process. Electronically Signed   By: Lovey Newcomer M.D.   On: 01/11/2019 08:49    EKG: Independently reviewed.  Sinus tachycardia with rate 121; nonspecific ST changes with LVH   Labs on Admission: I have personally reviewed the available labs and imaging studies at the time of the admission.  Pertinent labs:   Na++ 153 CO2 16 BUN 87/Creatinine 6.60/GFR 98; 81/3.58/19 on 11/3 Albumin 2.1 Bili 3.6 LDH 259 Ferritin 5481; 1390 on 11/2 CRP 26.3; 7.2 on 11/3 Lactate 2.3 Procalcitonin 43.95 WBC 12.9 Hgb 11.0 D-dimer 2.68; 0.70 on  11/3 Fibrinogen >800 INR 1.5 Blood cultures pending ABG: 7.455/23.1/95.1/16.3 COVID POSITIVE on 10/28   Assessment/Plan Principal Problem:   Acute renal failure superimposed on chronic kidney disease (HCC) Active Problems:   Pneumonia due to COVID-19 virus   Essential hypertension   History of renal transplant   Severe sepsis (HCC)   Acute renal failure superimposed on stage V CKD, s/p prior renal transplant -Baseline creatinine is about 4   -Today's creatinine is 6.6   -He appears to be extremely dehydrated and this is consistent with history - he was discharged from [facility] on 11/3 in the care of his elderly girlfriend; his niece was taking him food and drink but he was increasingly unable to get these supplies and become increasingly dehydrated -Will need volume resuscitation but he is critically ill at this time -Patient discussed with his niece, who now realizes that he cannot return to home independently; she prefers that he be aggressively rehydrated at this time and given antibiotics -She recognizes the severity of his illness -If he does not improve with current measures, she will consider transition to comfort measures only -Patient d/w Dr. Jonnie Finner (consult was not requested at this time) - will give D5W with bicarb for volume resuscitation following initial sepsis boluses -Follow up renal function by BMP -Avoid nephrotoxic agents, if possible  Sepsis -SIRS criteria in this patient includes: Leukocytosis, fever, tachycardia, tachypnea, hypoxia  -Patient has evidence of acute organ failure with elevated lactate and renal failure -While awaiting blood cultures, this appears to be a preseptic condition. -Sepsis protocol initiated -Suspected source is COVID PNA -Blood and urine cultures pending -Will trend lactate to ensure improvement -Markedly elevated procalcitonin level.  Antibiotics would not be indicated for PCT <0.1 and probably should not be used for < 0.25.  >0.5  indicates infection and >>0.5 indicates more serious disease.  As the procalcitonin level normalizes, it  will be reasonable to consider de-escalation of antibiotic coverage.  COVID-19 pneumonia with hypoxia -Recently admitted with this issue, treated with steroids and full course of Remdesivir -He was discharged on room air but is now currently requiring 2L [place] O2 -COVID POSITIVE during prior admission, not retested -The patient has comorbidities which may increase the risk for ARDS/MODS including: age, HTN, immunosuppression -Exam is concerning for development of ARDS/MODS due to renal failure -Pertinent labs concerning for COVID include increased BUN/Creatinine; markedly elevated D-dimer (>1); markedly elevated CRP (>>7); markedly increased ferritin; markedly increased fibrinogen -CXR with multifocal opacities which may be c/w COVID vs. Multifocal PNA -Will treat with broad-spectrum antibiotics given procalcitonin >0.1.   -Will treat with Cefepime monotherapy for now given renal failure, in an effort to prevent further renal damage; however, addition of Vancomycin may need to be considered if not improving -Niece and I have agreed to give IVF and antibiotics and monitor.  He is DNR.  If not improving, will transition to comfort measures.  As such, despite the critical nature of his illness, will place in progressive care rather than ICU and will not currently request PCCM consultation.  Will admit to [facility] Progressive Care unit for further evaluation, close monitoring, and treatment -Monitor on telemetry x at least 24 hours -At this time, will attempt to avoid use of aerosolized medications and use HFAs instead -Will check daily labs including BMP with Mag, Phos; LFTs; CBC with differential; CRP (q12h); ferritin; fibrinogen; D-dimer (q12h) -Will ask the patient to maintain an awake prone position for 16+ hours a day, if possible, with a minimum of 2-3 hours at a time -With D-dimer <5, will use  standard-dosed Lovenox for DVT prevention -Patient was seen wearing full PPE including: gown, gloves, head cover, N95, and face shield; donning and doffing was in compliance with current standards.  H/o renal transplant -Patient with chronic immunocompromise -Given presumed intolerance to PO medications, will hold Cellcept and Prograf and attempt to resume tomorrow AM -For now, will give stress-dosed hydrocortisone (50 mg IV q6h) instead of prednisone  HTN -Diet is clear liquids for now (advance to renal diet as tolerated), but he does not appear to be capable of swallowing pills -Will hold BP medications, including Norvasc, Catapres, Labetalol -Will add standing Labetalol and prn hydralazine    DVT prophylaxis:  Lovenox  Code Status:  DNR - confirmed with niece Family Communication: None present; I spoke with the patient's niece by telephone.  I later spoke with his girlfriend briefly, but the line was apparently disconnected after I informed him of the severity of his illness. Disposition Plan:  To be determined Consults called: None (nephrology by telephone only) Admission status: Admit - It is my clinical opinion that admission to INPATIENT is reasonable and necessary because of the expectation that this patient will require hospital care that crosses at least 2 midnights to treat this condition based on the medical complexity of the problems presented.  Given the aforementioned information, the predictability of an adverse outcome is felt to be significant.      Karmen Bongo MD [facility]   How to contact the [facility] Attending or Consulting provider [place] or covering provider during after hours [place], for this patient?  1. Check the care team in [facility] and look for a) attending/consulting [facility] provider listed and b) the [facility] team listed 2. Log into [email or web address] and use [place]'s universal password to access. If you do not have the password, please contact  the hospital  operator. 3. Locate the [facility] provider you are looking for under [facility] and page to a number that you can be directly reached. 4. If you still have difficulty reaching the provider, please page the [facility] (Director on Call) for the Hospitalists listed on amion for assistance.   01/11/2019, 12:56 PM

## 2019-01-11 NOTE — Progress Notes (Signed)
Pharmacy Antibiotic Note  Francisco Williamson is a 73 y.o. male recently admitted 10/28-11/3 with COVID PNA who represents on 01/11/2019 with AMS/SOB concerning for PNA. Pharmacy has been consulted for Vancomycin + Cefepime dosing.   The patient has a history of a renal transplant and had noted AKI last admission with recurrent AKI this admission - SCr 6.6 (BL 3-4?). Discussed with Dr. Maryan Rued to consider alternatives for Vancomycin who has decided to hold off on Vancomycin and gram positive coverage for now and continue with Cefepime only. Will allow admitting MD to decide if gram positive coverage is warranted and if that should be Zyvox instead of Vancomycin.   Plan: - Cefepime 2g IV x 1 followed by 1g IV every 24 hours - Holding off on Vancomycin with renal transplant and recent AKI per discussion with Dr. Maryan Rued - Admitting MD to follow-up on if gram positive coverage is warranted. If so, Zyvox likely better option - Will continue to follow renal function, culture results, LOT, and antibiotic de-escalation plans      Temp (24hrs), Avg:101.4 F (38.6 C), Min:101.4 F (38.6 C), Max:101.4 F (38.6 C)  Recent Labs  Lab 01/05/19 0533 01/06/19 0045  WBC 10.7* 8.9  CREATININE 3.97* 3.58*    Estimated Creatinine Clearance: 14.3 mL/min (A) (by C-G formula based on SCr of 3.58 mg/dL (H)).    No Known Allergies  Antimicrobials this admission: Cefepime 11/8 >>  Microbiology results: 11/8 BCx >>  Thank you for allowing pharmacy to be a part of this patient's care.  Alycia Rossetti, PharmD, BCPS Clinical Pharmacist Clinical phone for 01/11/2019: [phone] 01/11/2019 9:05 AM   **Pharmacist phone directory can now be found on [email or web address] ([email or web address]).  Listed under [place].

## 2019-01-11 NOTE — ED Notes (Signed)
Called [facility] for transport to [facility] 

## 2019-01-11 NOTE — Progress Notes (Signed)
Notified bedside nurse of need to draw repeat lactic acid. 

## 2019-01-11 NOTE — ED Notes (Signed)
Family would like update, Lorna Dibble (wife) [phone]

## 2019-01-12 ENCOUNTER — Encounter (HOSPITAL_COMMUNITY): Payer: Self-pay

## 2019-01-12 ENCOUNTER — Other Ambulatory Visit: Payer: Self-pay

## 2019-01-12 LAB — CBC WITH DIFFERENTIAL/PLATELET
Abs Immature Granulocytes: 0.12 10*3/uL — ABNORMAL HIGH (ref 0.00–0.07)
Basophils Absolute: 0 10*3/uL (ref 0.0–0.1)
Basophils Relative: 0 %
Eosinophils Absolute: 0 10*3/uL (ref 0.0–0.5)
Eosinophils Relative: 0 %
HCT: 29.9 % — ABNORMAL LOW (ref 39.0–52.0)
Hemoglobin: 9.8 g/dL — ABNORMAL LOW (ref 13.0–17.0)
Immature Granulocytes: 1 %
Lymphocytes Relative: 7 %
Lymphs Abs: 0.7 10*3/uL (ref 0.7–4.0)
MCH: 29 pg (ref 26.0–34.0)
MCHC: 32.8 g/dL (ref 30.0–36.0)
MCV: 88.5 fL (ref 80.0–100.0)
Monocytes Absolute: 0.4 10*3/uL (ref 0.1–1.0)
Monocytes Relative: 4 %
Neutro Abs: 9 10*3/uL — ABNORMAL HIGH (ref 1.7–7.7)
Neutrophils Relative %: 88 %
Platelets: 152 10*3/uL (ref 150–400)
RBC: 3.38 MIL/uL — ABNORMAL LOW (ref 4.22–5.81)
RDW: 16.9 % — ABNORMAL HIGH (ref 11.5–15.5)
WBC: 10.2 10*3/uL (ref 4.0–10.5)
nRBC: 0 % (ref 0.0–0.2)

## 2019-01-12 LAB — SAMPLE TO BLOOD BANK

## 2019-01-12 LAB — MAGNESIUM: Magnesium: 1.8 mg/dL (ref 1.7–2.4)

## 2019-01-12 LAB — D-DIMER, QUANTITATIVE: D-Dimer, Quant: 2.92 ug/mL-FEU — ABNORMAL HIGH (ref 0.00–0.50)

## 2019-01-12 LAB — COMPREHENSIVE METABOLIC PANEL
ALT: 16 U/L (ref 0–44)
AST: 34 U/L (ref 15–41)
Albumin: 2 g/dL — ABNORMAL LOW (ref 3.5–5.0)
Alkaline Phosphatase: 46 U/L (ref 38–126)
Anion gap: 12 (ref 5–15)
BUN: 81 mg/dL — ABNORMAL HIGH (ref 8–23)
CO2: 26 mmol/L (ref 22–32)
Calcium: 7.8 mg/dL — ABNORMAL LOW (ref 8.9–10.3)
Chloride: 119 mmol/L — ABNORMAL HIGH (ref 98–111)
Creatinine, Ser: 5.81 mg/dL — ABNORMAL HIGH (ref 0.61–1.24)
GFR calc Af Amer: 10 mL/min — ABNORMAL LOW (ref >60)
GFR calc non Af Amer: 9 mL/min — ABNORMAL LOW (ref >60)
Glucose, Bld: 237 mg/dL — ABNORMAL HIGH (ref 70–99)
Potassium: 3.9 mmol/L (ref 3.5–5.1)
Sodium: 157 mmol/L — ABNORMAL HIGH (ref 135–145)
Total Bilirubin: 2.1 mg/dL — ABNORMAL HIGH (ref 0.3–1.2)
Total Protein: 7.4 g/dL (ref 6.5–8.1)

## 2019-01-12 LAB — PHOSPHORUS: Phosphorus: 3.8 mg/dL (ref 2.5–4.6)

## 2019-01-12 LAB — FERRITIN: Ferritin: >7500 ng/mL — ABNORMAL HIGH (ref 24–336)

## 2019-01-12 MED ORDER — DEXTROSE 5 % IV SOLN
INTRAVENOUS | Status: DC
  Administered 2019-01-12: 16:00:00 via INTRAVENOUS

## 2019-01-12 MED ORDER — ORAL CARE MOUTH RINSE
15.0000 mL | Freq: Two times a day (BID) | OROMUCOSAL | Status: DC
  Administered 2019-01-12 – 2019-02-06 (×48): 15 mL via OROMUCOSAL

## 2019-01-12 MED ORDER — PANTOPRAZOLE SODIUM 40 MG PO TBEC
40.0000 mg | DELAYED_RELEASE_TABLET | Freq: Every day | ORAL | Status: DC
  Administered 2019-01-12 – 2019-01-13 (×2): 40 mg via ORAL
  Filled 2019-01-12 (×2): qty 1

## 2019-01-12 MED ORDER — CLONIDINE HCL 0.2 MG PO TABS
0.2000 mg | ORAL_TABLET | Freq: Two times a day (BID) | ORAL | Status: DC
  Administered 2019-01-12 – 2019-01-16 (×8): 0.2 mg via ORAL
  Administered 2019-01-16: 10:00:00 0.1 mg via ORAL
  Administered 2019-01-17 – 2019-02-06 (×39): 0.2 mg via ORAL
  Filled 2019-01-12 (×2): qty 2
  Filled 2019-01-12 (×2): qty 1
  Filled 2019-01-12: qty 2
  Filled 2019-01-12 (×4): qty 1
  Filled 2019-01-12: qty 2
  Filled 2019-01-12 (×2): qty 1
  Filled 2019-01-12: qty 2
  Filled 2019-01-12: qty 1
  Filled 2019-01-12 (×2): qty 2
  Filled 2019-01-12: qty 1
  Filled 2019-01-12 (×2): qty 2
  Filled 2019-01-12 (×7): qty 1
  Filled 2019-01-12: qty 2
  Filled 2019-01-12 (×8): qty 1
  Filled 2019-01-12: qty 2
  Filled 2019-01-12: qty 1
  Filled 2019-01-12 (×2): qty 2
  Filled 2019-01-12 (×4): qty 1
  Filled 2019-01-12 (×2): qty 2
  Filled 2019-01-12 (×3): qty 1
  Filled 2019-01-12: qty 2

## 2019-01-12 MED ORDER — AMLODIPINE BESYLATE 10 MG PO TABS
10.0000 mg | ORAL_TABLET | Freq: Every day | ORAL | Status: DC
  Administered 2019-01-12 – 2019-02-06 (×24): 10 mg via ORAL
  Filled 2019-01-12 (×25): qty 1

## 2019-01-12 MED ORDER — SULFAMETHOXAZOLE-TRIMETHOPRIM 400-80 MG PO TABS
1.0000 | ORAL_TABLET | ORAL | Status: DC
  Administered 2019-01-12 – 2019-02-06 (×14): 1 via ORAL
  Filled 2019-01-12 (×17): qty 1

## 2019-01-12 NOTE — Progress Notes (Signed)
PROGRESS NOTE                                                                                                                                                                                                             Patient Demographics:    Francisco Williamson, is a 73 y.o. male, DOB - 1945/05/07, [id number]  Admit date - 01/11/2019   Admitting Physician Karmen Bongo, MD  Outpatient Primary MD for the patient is Default, Provider, MD  LOS - 1   Chief Complaint  Patient presents with  . Altered Mental Status  . Shortness of Breath       Brief Narrative    73 y.o. male with medical history significant of HTN and stage V CKD s/p renal transplantation presenting with AMS.  He was admitted to [facility] from 10/28-11/3 for COVID-19-associated PNA with acute respiratory failure with hypoxia.  He completed a course of Remdesivir and steroids, and he was discharged home on 11/3, patient was brought back to ED, given failure to thrive, dehydration and altered mental status, he was noted to have significant dehydration with hypernatremia and renal failure, he was transferred to [facility] for further management.    Subjective:    Kevaughn Cutbirth today denies any chest pain, shortness of breath, fever or chills.   Assessment  & Plan :    Principal Problem:   Acute renal failure superimposed on chronic kidney disease ([place]) Active Problems:   Pneumonia due to COVID-19 virus   Essential hypertension   History of renal transplant   Severe sepsis ([place])    SIRS -Patient presents with leukocytosis, fever, tachycardia, tachypnea, hypoxia. -Unclear if this is related to Covid or not, but following septic work-up, so far blood cultures are negative, UA is negative, continue with broad-spectrum antibiotic coverage. -We will trend procalcitonin  Covid 19  pneumonia -Patient was treated during recent hospitalization with remdesivir and steroids. -CRP and  ferritin trending up, unclear if this is related to pneumonia or acute bacterial infection especially with elevated procalcitonin -Continue to trend inflammatory markers closely  COVID-19 Labs  Recent Labs    01/11/19 0800 01/11/19 1700 01/12/19 0820  DDIMER 2.68* 2.09* 2.92*  FERRITIN 5,481*  --  >7,500*  LDH 259*  --   --   CRP 26.3* 20.8*  --     Lab Results  Component Value Date   SARSCOV2NAA POSITIVE (A) 12/31/2018   AKI on CKD stage V/renal transplant patient -Baseline creatinine 3.5, was significantly elevated 6.6 on admission, continue with IV fluids, improved to 5.8 today. -Initially on bicarb drip, this has been changed to D5W given bicarb has normalized -Continue  with immunosuppressive therapy.  Continue with Bactrim for prophylaxis  Hypernatremia -Will change to D5W.  Hypertension -Blood pressure started to increase, resume on clonidine and amlodipine, continue to hold labetalol  Code Status : DNR  Family Communication  : D/W patient  Disposition Plan  : will need SNF  Consults  :  None  Procedures  : None  DVT Prophylaxis  :  [place] heparin  Lab Results  Component Value Date   PLT 152 01/12/2019    Antibiotics  :    Anti-infectives (From admission, onward)   Start     Dose/Rate Route Frequency Ordered Stop   01/12/19 1800  ceFEPIme (MAXIPIME) 1 g in sodium chloride 0.9 % 100 mL IVPB     1 g 200 mL/hr over 30 Minutes Intravenous Every 24 hours 01/11/19 0910     01/11/19 0830  vancomycin (VANCOCIN) IVPB 1000 mg/200 mL premix  Status:  Discontinued     1,000 mg 200 mL/hr over 60 Minutes Intravenous  Once 01/11/19 0825 01/11/19 0901   01/11/19 0830  ceFEPIme (MAXIPIME) 2 g in sodium chloride 0.9 % 100 mL IVPB     2 g 200 mL/hr over 30 Minutes Intravenous  Once 01/11/19 0825 01/11/19 0952        Objective:   Vitals:   01/12/19 0800 01/12/19 0847 01/12/19 1200 01/12/19 1300  BP: (!) 166/68  (!) 157/55   Pulse: 78  75 89  Resp: (!) 23  (!) 26  19  Temp:      TempSrc:      SpO2: 97% 96% 100% 99%  Weight:      Height:        Wt Readings from Last 3 Encounters:  01/12/19 43.7 kg  01/01/19 53.5 kg  04/08/15 56.7 kg     Intake/Output Summary (Last 24 hours) at 01/12/2019 1437 Last data filed at 01/12/2019 1200 Gross per 24 hour  Intake 240 ml  Output -  Net 240 ml     Physical Exam  Awake Alert, frail, chronically ill-appearing . Symmetrical Chest wall movement, Good air movement bilaterally, CTAB RRR,No Gallops,Rubs or new Murmurs, No Parasternal Heave +ve B.Sounds, Abd Soft, No tenderness, No rebound - guarding or rigidity. No Cyanosis, Clubbing or edema, No new Rash or bruise      Data Review:    CBC Recent Labs  Lab 01/06/19 0045 01/11/19 0800 01/11/19 0839 01/12/19 0820  WBC 8.9 12.9*  --  10.2  HGB 9.4* 11.0* 9.9* 9.8*  HCT 27.3* 34.2* 29.0* 29.9*  PLT 154 192  --  152  MCV 83.2 90.5  --  88.5  MCH 28.7 29.1  --  29.0  MCHC 34.4 32.2  --  32.8  RDW 15.8* 17.2*  --  16.9*  LYMPHSABS 0.6* 1.6  --  0.7  MONOABS 0.7 1.0  --  0.4  EOSABS 0.0 0.2  --  0.0  BASOSABS 0.0 0.0  --  0.0    Chemistries  Recent Labs  Lab 01/06/19 0045 01/11/19 0800 01/11/19 0839 01/12/19 0820  NA 139 153* 155* 157*  K 3.4* 4.8 4.5 3.9  CL 112* 125*  --  119*  CO2 20* 16*  --  26  GLUCOSE 121* 87  --  237*  BUN 81* 87*  --  81*  CREATININE 3.58* 6.60*  --  5.81*  CALCIUM 7.6* 8.4*  --  7.8*  MG 2.1  --   --  1.8  AST 52* 30  --  34  ALT 32 22  --  16  ALKPHOS 58 49  --  46  BILITOT 0.3 3.6*  --  2.1*   ------------------------------------------------------------------------------------------------------------------ Recent Labs    01/11/19 0800  TRIG 110    Lab Results  Component Value Date   HGBA1C 4.2 (L) 01/06/2019   ------------------------------------------------------------------------------------------------------------------ No results for input(s): TSH, T4TOTAL, T3FREE, THYROIDAB in the  last 72 hours.  Invalid input(s): FREET3 ------------------------------------------------------------------------------------------------------------------ Recent Labs    01/11/19 0800 01/12/19 0820  FERRITIN 5,481* >7,500*    Coagulation profile Recent Labs  Lab 01/11/19 0800  INR 1.5*    Recent Labs    01/11/19 1700 01/12/19 0820  DDIMER 2.09* 2.92*    Cardiac Enzymes No results for input(s): CKMB, TROPONINI, MYOGLOBIN in the last 168 hours.  Invalid input(s): CK ------------------------------------------------------------------------------------------------------------------    Component Value Date/Time   BNP 154.8 (H) 01/01/2019 [id number]    Inpatient Medications  Scheduled Meds: . aspirin EC  81 mg Oral Daily  . docusate sodium  100 mg Oral BID  . heparin  5,000 Units Subcutaneous Q8H  . hydrocortisone sod succinate (SOLU-CORTEF) inj  50 mg Intravenous Q6H  . Ipratropium-Albuterol  1 puff Inhalation Q6H  . labetalol  5 mg Intravenous Q6H  . mouth rinse  15 mL Mouth Rinse BID  . mycophenolate  500 mg Oral BID  . sodium chloride flush  3 mL Intravenous Q12H  . tacrolimus  4 mg Oral BID   Continuous Infusions: . sodium chloride    . ceFEPime (MAXIPIME) IV    . sodium bicarbonate 150 mEq in dextrose 5% 1000 mL 150 mEq (01/12/19 1142)   PRN Meds:.sodium chloride, acetaminophen, bisacodyl, calcium carbonate (dosed in mg elemental calcium), camphor-menthol **AND** hydrOXYzine, docusate sodium, feeding supplement (NEPRO CARB STEADY), hydrALAZINE, ondansetron **OR** ondansetron (ZOFRAN) IV, oxyCODONE, polyethylene glycol, sodium chloride flush, sorbitol, zolpidem  Micro Results Recent Results (from the past 240 hour(s))  Blood Culture (routine x 2)     Status: None (Preliminary result)   Collection Time: 01/11/19  8:00 AM   Specimen: BLOOD LEFT FOREARM  Result Value Ref Range Status   Specimen Description BLOOD LEFT FOREARM  Final   Special Requests   Final     BOTTLES DRAWN AEROBIC AND ANAEROBIC Blood Culture adequate volume   Culture   Final    NO GROWTH 1 DAY Performed at [facility], [address] [place], [place] [address]    Report Status PENDING  Incomplete  Blood Culture (routine x 2)     Status: None (Preliminary result)   Collection Time: 01/11/19  8:15 AM   Specimen: BLOOD RIGHT HAND  Result Value Ref Range Status   Specimen Description BLOOD RIGHT HAND  Final   Special Requests   Final    BOTTLES DRAWN AEROBIC ONLY Blood Culture adequate volume   Culture   Final    NO GROWTH 1 DAY Performed at [facility], [place] [address] [place], [place] [address]    Report Status PENDING  Incomplete  Culture, Urine     Status: None (Preliminary result)   Collection Time: 01/11/19  2:55 PM   Specimen: Urine, Clean Catch  Result Value Ref  Range Status   Specimen Description URINE, CLEAN CATCH  Final   Special Requests NONE  Final   Culture   Final    CULTURE REINCUBATED FOR BETTER GROWTH Performed at [facility], [place] [address], [place], [place] [address]    Report Status PENDING  Incomplete    Radiology Reports Dg Chest 2 View  Result Date: 12/31/2018 CLINICAL DATA:  Sore throat, runny nose, cough and chills. EXAM: CHEST - 2 VIEW COMPARISON:  05/13/2013 FINDINGS: The heart size and mediastinal contours are within normal limits. Superimposed on chronic lung disease is some increased bronchial and interstitial prominence in the right lower lung which may be consistent with acute bronchitis and potentially early pneumonia. No pulmonary edema. The visualized skeletal structures are unremarkable. IMPRESSION: Increased bronchial and interstitial prominence in the right lower lung which may be consistent with acute bronchitis and potentially early pneumonia. Electronically Signed   By: Aletta Edouard M.D.   On: 12/31/2018 08:46   Dg Chest Port 1 View  Result Date: 01/11/2019 CLINICAL DATA:  Altered mental status EXAM:  PORTABLE CHEST 1 VIEW COMPARISON:  Chest radiograph 12/31/2018 FINDINGS: Monitoring leads overlie the patient. Stable cardiac and mediastinal contours. Interval development of bilateral patchy consolidative opacities. No pleural effusion or pneumothorax. IMPRESSION: Bilateral patchy areas of consolidation may represent multifocal pneumonia or atypical/viral infectious process. Electronically Signed   By: Lovey Newcomer M.D.   On: 01/11/2019 08:49       Phillips Climes M.D on 01/12/2019 at 2:37 PM  Between 7am to 7pm - Pager - [phone]  After 7pm go to [email or web address] - password [facility]  [facility] -  Office  [phone]

## 2019-01-12 NOTE — ED Notes (Signed)
[facility] present to transfer pt to [facility]. Verbal report given

## 2019-01-12 NOTE — Progress Notes (Signed)
Updated significant other on POC

## 2019-01-12 NOTE — ED Notes (Signed)
Attempted calling report to [place] unit x 2, no answer.

## 2019-01-12 NOTE — Progress Notes (Signed)
Spoke with niece regarding POC.  She wanted to make sure patient was getting enough hydration (which he is) and if he was getting his anti-rejection meds for his kidneys (which he is).  She is concerned about his discharge disposition and I informed her that in his current state, he would definitely need 24/7 care and would be best suited for rehab.  I told him I already placed a SW consult to get that ball rolling.  Niece is content with current POC.

## 2019-01-12 NOTE — Plan of Care (Signed)
Very poor appetite.  Req nutrition consult from MD.  IVF switched to D5.  On RA.  VSS except BP slightly elevated.  NAD.   Problem: Education: Goal: Knowledge of General Education information will improve Description: Including pain rating scale, medication(s)/side effects and non-pharmacologic comfort measures Outcome: Progressing   Problem: Health Behavior/Discharge Planning: Goal: Ability to manage health-related needs will improve Outcome: Progressing   Problem: Clinical Measurements: Goal: Ability to maintain clinical measurements within normal limits will improve Outcome: Progressing Goal: Will remain free from infection Outcome: Progressing Goal: Diagnostic test results will improve Outcome: Progressing Goal: Respiratory complications will improve Outcome: Progressing Goal: Cardiovascular complication will be avoided Outcome: Progressing   Problem: Activity: Goal: Risk for activity intolerance will decrease Outcome: Progressing   Problem: Nutrition: Goal: Adequate nutrition will be maintained Outcome: Progressing   Problem: Coping: Goal: Level of anxiety will decrease Outcome: Progressing   Problem: Elimination: Goal: Will not experience complications related to bowel motility Outcome: Progressing Goal: Will not experience complications related to urinary retention Outcome: Progressing   Problem: Pain Managment: Goal: General experience of comfort will improve Outcome: Progressing   Problem: Safety: Goal: Ability to remain free from injury will improve Outcome: Progressing   Problem: Skin Integrity: Goal: Risk for impaired skin integrity will decrease Outcome: Progressing

## 2019-01-12 NOTE — Progress Notes (Signed)
Updated significant other again regarding POC

## 2019-01-13 ENCOUNTER — Inpatient Hospital Stay (HOSPITAL_COMMUNITY): Payer: Self-pay

## 2019-01-13 DIAGNOSIS — E870 Hyperosmolality and hypernatremia: Secondary | ICD-10-CM

## 2019-01-13 LAB — COMPREHENSIVE METABOLIC PANEL
ALT: 17 U/L (ref 0–44)
AST: 28 U/L (ref 15–41)
Albumin: 1.7 g/dL — ABNORMAL LOW (ref 3.5–5.0)
Alkaline Phosphatase: 44 U/L (ref 38–126)
Anion gap: 11 (ref 5–15)
BUN: 74 mg/dL — ABNORMAL HIGH (ref 8–23)
CO2: 28 mmol/L (ref 22–32)
Calcium: 7.4 mg/dL — ABNORMAL LOW (ref 8.9–10.3)
Chloride: 108 mmol/L (ref 98–111)
Creatinine, Ser: 5.12 mg/dL — ABNORMAL HIGH (ref 0.61–1.24)
GFR calc Af Amer: 12 mL/min — ABNORMAL LOW (ref >60)
GFR calc non Af Amer: 10 mL/min — ABNORMAL LOW (ref >60)
Glucose, Bld: 234 mg/dL — ABNORMAL HIGH (ref 70–99)
Potassium: 3.2 mmol/L — ABNORMAL LOW (ref 3.5–5.1)
Sodium: 147 mmol/L — ABNORMAL HIGH (ref 135–145)
Total Bilirubin: 1.5 mg/dL — ABNORMAL HIGH (ref 0.3–1.2)
Total Protein: 6.7 g/dL (ref 6.5–8.1)

## 2019-01-13 LAB — CBC WITH DIFFERENTIAL/PLATELET
Abs Immature Granulocytes: 0.07 10*3/uL (ref 0.00–0.07)
Basophils Absolute: 0 10*3/uL (ref 0.0–0.1)
Basophils Relative: 0 %
Eosinophils Absolute: 0 10*3/uL (ref 0.0–0.5)
Eosinophils Relative: 0 %
HCT: 25.8 % — ABNORMAL LOW (ref 39.0–52.0)
Hemoglobin: 8.4 g/dL — ABNORMAL LOW (ref 13.0–17.0)
Immature Granulocytes: 1 %
Lymphocytes Relative: 10 %
Lymphs Abs: 0.8 10*3/uL (ref 0.7–4.0)
MCH: 28.6 pg (ref 26.0–34.0)
MCHC: 32.6 g/dL (ref 30.0–36.0)
MCV: 87.8 fL (ref 80.0–100.0)
Monocytes Absolute: 0.3 10*3/uL (ref 0.1–1.0)
Monocytes Relative: 4 %
Neutro Abs: 6.9 10*3/uL (ref 1.7–7.7)
Neutrophils Relative %: 85 %
Platelets: 127 10*3/uL — ABNORMAL LOW (ref 150–400)
RBC: 2.94 MIL/uL — ABNORMAL LOW (ref 4.22–5.81)
RDW: 16.9 % — ABNORMAL HIGH (ref 11.5–15.5)
WBC: 8 10*3/uL (ref 4.0–10.5)
nRBC: 0 % (ref 0.0–0.2)

## 2019-01-13 LAB — BASIC METABOLIC PANEL
Anion gap: 12 (ref 5–15)
BUN: 77 mg/dL — ABNORMAL HIGH (ref 8–23)
CO2: 25 mmol/L (ref 22–32)
Calcium: 7.4 mg/dL — ABNORMAL LOW (ref 8.9–10.3)
Chloride: 104 mmol/L (ref 98–111)
Creatinine, Ser: 5.14 mg/dL — ABNORMAL HIGH (ref 0.61–1.24)
GFR calc Af Amer: 12 mL/min — ABNORMAL LOW (ref >60)
GFR calc non Af Amer: 10 mL/min — ABNORMAL LOW (ref >60)
Glucose, Bld: 378 mg/dL — ABNORMAL HIGH (ref 70–99)
Potassium: 3.4 mmol/L — ABNORMAL LOW (ref 3.5–5.1)
Sodium: 141 mmol/L (ref 135–145)

## 2019-01-13 LAB — CBC
HCT: 23 % — ABNORMAL LOW (ref 39.0–52.0)
Hemoglobin: 7.6 g/dL — ABNORMAL LOW (ref 13.0–17.0)
MCH: 28.8 pg (ref 26.0–34.0)
MCHC: 33 g/dL (ref 30.0–36.0)
MCV: 87.1 fL (ref 80.0–100.0)
Platelets: 115 10*3/uL — ABNORMAL LOW (ref 150–400)
RBC: 2.64 MIL/uL — ABNORMAL LOW (ref 4.22–5.81)
RDW: 17.2 % — ABNORMAL HIGH (ref 11.5–15.5)
WBC: 8.6 10*3/uL (ref 4.0–10.5)
nRBC: 0 % (ref 0.0–0.2)

## 2019-01-13 LAB — ABO/RH: ABO/RH(D): O POS

## 2019-01-13 LAB — C-REACTIVE PROTEIN: CRP: 20.7 mg/dL — ABNORMAL HIGH (ref <1.0)

## 2019-01-13 LAB — URINE CULTURE: Culture: 40000 — AB

## 2019-01-13 LAB — PHOSPHORUS: Phosphorus: 4.1 mg/dL (ref 2.5–4.6)

## 2019-01-13 LAB — D-DIMER, QUANTITATIVE: D-Dimer, Quant: 1.98 ug/mL-FEU — ABNORMAL HIGH (ref 0.00–0.50)

## 2019-01-13 LAB — MAGNESIUM: Magnesium: 1.5 mg/dL — ABNORMAL LOW (ref 1.7–2.4)

## 2019-01-13 LAB — FERRITIN: Ferritin: >7500 ng/mL — ABNORMAL HIGH (ref 24–336)

## 2019-01-13 MED ORDER — SODIUM CHLORIDE 0.45 % IV SOLN
INTRAVENOUS | Status: DC
  Administered 2019-01-13: 09:00:00 via INTRAVENOUS

## 2019-01-13 MED ORDER — MAGNESIUM SULFATE IN D5W 1-5 GM/100ML-% IV SOLN
1.0000 g | Freq: Once | INTRAVENOUS | Status: AC
  Administered 2019-01-13: 1 g via INTRAVENOUS
  Filled 2019-01-13: qty 100

## 2019-01-13 MED ORDER — SODIUM CHLORIDE 0.9 % IV SOLN
INTRAVENOUS | Status: DC
  Administered 2019-01-13: 19:00:00 via INTRAVENOUS

## 2019-01-13 MED ORDER — POTASSIUM CHLORIDE CRYS ER 20 MEQ PO TBCR
40.0000 meq | EXTENDED_RELEASE_TABLET | Freq: Once | ORAL | Status: AC
  Administered 2019-01-13: 40 meq via ORAL
  Filled 2019-01-13: qty 2

## 2019-01-13 MED ORDER — VANCOMYCIN HCL 500 MG IV SOLR
500.0000 mg | Freq: Once | INTRAVENOUS | Status: AC
  Administered 2019-01-16: 500 mg via INTRAVENOUS
  Filled 2019-01-13: qty 500

## 2019-01-13 MED ORDER — ENSURE ENLIVE PO LIQD
237.0000 mL | Freq: Three times a day (TID) | ORAL | Status: DC
  Administered 2019-01-13 – 2019-02-05 (×41): 237 mL via ORAL

## 2019-01-13 MED ORDER — VANCOMYCIN HCL IN DEXTROSE 1-5 GM/200ML-% IV SOLN
1000.0000 mg | Freq: Once | INTRAVENOUS | Status: AC
  Administered 2019-01-13: 1000 mg via INTRAVENOUS
  Filled 2019-01-13: qty 200

## 2019-01-13 MED ORDER — METOPROLOL TARTRATE 25 MG PO TABS
12.5000 mg | ORAL_TABLET | Freq: Two times a day (BID) | ORAL | Status: DC
  Administered 2019-01-13 – 2019-01-15 (×6): 12.5 mg via ORAL
  Filled 2019-01-13 (×6): qty 1

## 2019-01-13 MED ORDER — PANTOPRAZOLE SODIUM 40 MG IV SOLR
40.0000 mg | Freq: Two times a day (BID) | INTRAVENOUS | Status: DC
  Administered 2019-01-13 – 2019-01-23 (×21): 40 mg via INTRAVENOUS
  Filled 2019-01-13 (×22): qty 40

## 2019-01-13 NOTE — Progress Notes (Signed)
Tele called, patient had 25 beats VT, QTc 600/ MD notified. Will continue to monitor. Patient is asymptomatic.

## 2019-01-13 NOTE — Progress Notes (Addendum)
PROGRESS NOTE                                                                                                                                                                                                             Patient Demographics:    Francisco Williamson, is a 73 y.o. male, DOB - [birth year], [id number]  Admit date - 01/11/2019   Admitting Physician Karmen Bongo, MD  Outpatient Primary MD for the patient is Default, Provider, MD  LOS - 2   Chief Complaint  Patient presents with  . Altered Mental Status  . Shortness of Breath       Brief Narrative    73 y.o. male with medical history significant of HTN and stage V CKD s/p renal transplantation presenting with AMS.  He was admitted to [facility] from 10/28-11/3 for COVID-19-associated PNA with acute respiratory failure with hypoxia.  He completed a course of Remdesivir and steroids, and he was discharged home on 11/3, patient was brought back to ED, given failure to thrive, dehydration and altered mental status, he was noted to have significant dehydration with hypernatremia and renal failure, he was transferred to [facility] for further management.    Subjective:    Francisco Williamson today denies any chest pain, shortness of breath, fever or chills.reports his appetitie has improved.   Assessment  & Plan :    Principal Problem:   Acute renal failure superimposed on chronic kidney disease ([place]) Active Problems:   Pneumonia due to COVID-19 virus   Essential hypertension   History of renal transplant   Severe sepsis ([place])    Sepsis due to staph  epidermidis UTI -Sepsis present on admission, presents with leukocytosis, fever, tachycardia, tachypnea, hypoxia. -Urine culture growing Staph epidermidis, is immunocompromised, discussed with pharmacy, will treat with vancomycin, will receive 1 dose now, and another dose in 3 days given his renal function and this should be sufficient, will go ahead  and DC cefepime. -Continue to trend procalcitonin  Covid 19  pneumonia -Patient was treated during recent hospitalization with remdesivir and steroids. -CRP and ferritin trending up, unclear if this is related to pneumonia or acute bacterial infection especially with elevated procalcitonin -Continue to trend inflammatory markers closely  COVID-19 Labs  Recent Labs    01/11/19 0800 01/11/19 1700 01/12/19 0820 01/13/19 0334  DDIMER 2.68* 2.09* 2.92*  1.98*  FERRITIN 5,481*  --  >7,500* >7,500*  LDH 259*  --   --   --   CRP 26.3* 20.8*  --  20.7*    Lab Results  Component Value Date   SARSCOV2NAA POSITIVE (A) 12/31/2018   AKI on CKD stage V/renal transplant patient -Baseline creatinine 3.5, was significantly elevated 6.6 on admission, continue with IV fluids, improved to 5.1 today. -Initially on bicarb drip, this has been changed to D5W given bicarb has normalized -Continue  with immunosuppressive therapy.  Continue with Bactrim for prophylaxis  NSVT/prolonged QTC -Patient had 6 beats of NSVT, his potassium and magnesium were low, they were repleted, started on low-dose beta-blockers, monitor on telemetry, will obtain 2D echo. -Prolonged QTC on telemetry monitor, will obtain EKG to confirm, this is most likely in the setting of electrolyte derangement, potassium and magnesium has been corrected, avoid QTC prolonging agents.  Hypernatremia -Significantly improved, will change D5W to NS  Hypertension -Blood pressure acceptable on clonidine and amlodipine, I have started on metoprolol in the setting of NSVT, will continue to hold labetalol .  Hypokalemia/hypomagnesemia -Please see above discussion  PCM -Nutritionist consulted  Addendum at 7:10 PM: Patient had small amount of blood with his stool, will hold his heparin subcu, will repeat CBC, will get type and screen , and start protonix 40 mg IV BID.  Code Status : DNR  Family Communication  : D/W patient, D/W niece    Disposition Plan  : will need SNF  Consults  :  None  Procedures  : None  DVT Prophylaxis  :   heparin  Lab Results  Component Value Date   PLT 127 (L) 01/13/2019    Antibiotics  :    Anti-infectives (From admission, onward)   Start     Dose/Rate Route Frequency Ordered Stop   01/12/19 1800  ceFEPIme (MAXIPIME) 1 g in sodium chloride 0.9 % 100 mL IVPB     1 g 200 mL/hr over 30 Minutes Intravenous Every 24 hours 01/11/19 0910     01/12/19 1600  sulfamethoxazole-trimethoprim (BACTRIM) 400-80 MG per tablet 1 tablet    Note to Pharmacy: Take one tablet by mouth on Monday, Wednesday and fridays.     1 tablet Oral Every M-W-F 01/12/19 1449     01/11/19 0830  vancomycin (VANCOCIN) IVPB 1000 mg/200 mL premix  Status:  Discontinued     1,000 mg 200 mL/hr over 60 Minutes Intravenous  Once 01/11/19 0825 01/11/19 0901   01/11/19 0830  ceFEPIme (MAXIPIME) 2 g in sodium chloride 0.9 % 100 mL IVPB     2 g 200 mL/hr over 30 Minutes Intravenous  Once 01/11/19 0825 01/11/19 0952        Objective:   Vitals:   01/13/19 0000 01/13/19 0400 01/13/19 0748 01/13/19 0900  BP: (!) 128/57  (!) 143/62 (!) 155/67  Pulse: 67  81   Resp: (!) 28  20   Temp: 98 F (36.7 C) 98.2 F (36.8 C) 98 F (36.7 C)   TempSrc: Oral Oral Oral   SpO2: 99%  100%   Weight:      Height:        Wt Readings from Last 3 Encounters:  01/12/19 43.7 kg  01/01/19 53.5 kg  04/08/15 56.7 kg     Intake/Output Summary (Last 24 hours) at 01/13/2019 1414 Last data filed at 01/13/2019 0904 Gross per 24 hour  Intake 3699.11 ml  Output 1275 ml  Net 2424.11 ml  Physical Exam  Awake Alert, more oriented and appropriate today, frail Left eye blindness Symmetrical Chest wall movement, Good air movement bilaterally, CTAB RRR,No Gallops,Rubs or new Murmurs, No Parasternal Heave +ve B.Sounds, Abd Soft, No tenderness, No rebound - guarding or rigidity. No Cyanosis, Clubbing or edema, No new Rash or bruise       Data Review:    CBC Recent Labs  Lab 01/11/19 0800 01/11/19 0839 01/12/19 0820 01/13/19 0334  WBC 12.9*  --  10.2 8.0  HGB 11.0* 9.9* 9.8* 8.4*  HCT 34.2* 29.0* 29.9* 25.8*  PLT 192  --  152 127*  MCV 90.5  --  88.5 87.8  MCH 29.1  --  29.0 28.6  MCHC 32.2  --  32.8 32.6  RDW 17.2*  --  16.9* 16.9*  LYMPHSABS 1.6  --  0.7 0.8  MONOABS 1.0  --  0.4 0.3  EOSABS 0.2  --  0.0 0.0  BASOSABS 0.0  --  0.0 0.0    Chemistries  Recent Labs  Lab 01/11/19 0800 01/11/19 0839 01/12/19 0820 01/13/19 0334  NA 153* 155* 157* 147*  K 4.8 4.5 3.9 3.2*  CL 125*  --  119* 108  CO2 16*  --  26 28  GLUCOSE 87  --  237* 234*  BUN 87*  --  81* 74*  CREATININE 6.60*  --  5.81* 5.12*  CALCIUM 8.4*  --  7.8* 7.4*  MG  --   --  1.8 1.5*  AST 30  --  34 28  ALT 22  --  16 17  ALKPHOS 49  --  46 44  BILITOT 3.6*  --  2.1* 1.5*   ------------------------------------------------------------------------------------------------------------------ Recent Labs    01/11/19 0800  TRIG 110    Lab Results  Component Value Date   HGBA1C 4.2 (L) 01/06/2019   ------------------------------------------------------------------------------------------------------------------ No results for input(s): TSH, T4TOTAL, T3FREE, THYROIDAB in the last 72 hours.  Invalid input(s): FREET3 ------------------------------------------------------------------------------------------------------------------ Recent Labs    01/12/19 0820 01/13/19 0334  FERRITIN >7,500* >7,500*    Coagulation profile Recent Labs  Lab 01/11/19 0800  INR 1.5*    Recent Labs    01/12/19 0820 01/13/19 0334  DDIMER 2.92* 1.98*    Cardiac Enzymes No results for input(s): CKMB, TROPONINI, MYOGLOBIN in the last 168 hours.  Invalid input(s): CK ------------------------------------------------------------------------------------------------------------------    Component Value Date/Time   BNP 154.8 (H) 01/01/2019 [id number]     Inpatient Medications  Scheduled Meds: . amLODipine  10 mg Oral Daily  . aspirin EC  81 mg Oral Daily  . cloNIDine  0.2 mg Oral BID  . docusate sodium  100 mg Oral BID  . heparin  5,000 Units Subcutaneous Q8H  . hydrocortisone sod succinate (SOLU-CORTEF) inj  50 mg Intravenous Q6H  . Ipratropium-Albuterol  1 puff Inhalation Q6H  . labetalol  5 mg Intravenous Q6H  . mouth rinse  15 mL Mouth Rinse BID  . mycophenolate  500 mg Oral BID  . pantoprazole  40 mg Oral Daily  . sodium chloride flush  3 mL Intravenous Q12H  . sulfamethoxazole-trimethoprim  1 tablet Oral Q M,W,F  . tacrolimus  4 mg Oral BID   Continuous Infusions: . sodium chloride 50 mL/hr at 01/13/19 0901  . sodium chloride    . ceFEPime (MAXIPIME) IV 1 g (01/12/19 1831)   PRN Meds:.sodium chloride, acetaminophen, bisacodyl, calcium carbonate (dosed in mg elemental calcium), camphor-menthol **AND** hydrOXYzine, docusate sodium, feeding supplement (NEPRO CARB STEADY), hydrALAZINE, ondansetron **OR**  ondansetron (ZOFRAN) IV, oxyCODONE, polyethylene glycol, sodium chloride flush, sorbitol, zolpidem  Micro Results Recent Results (from the past 240 hour(s))  Blood Culture (routine x 2)     Status: None (Preliminary result)   Collection Time: 01/11/19  8:00 AM   Specimen: BLOOD LEFT FOREARM  Result Value Ref Range Status   Specimen Description BLOOD LEFT FOREARM  Final   Special Requests   Final    BOTTLES DRAWN AEROBIC AND ANAEROBIC Blood Culture adequate volume   Culture   Final    NO GROWTH 2 DAYS Performed at [facility], [place] [address] [place], [place] [address]    Report Status PENDING  Incomplete  Blood Culture (routine x 2)     Status: None (Preliminary result)   Collection Time: 01/11/19  8:15 AM   Specimen: BLOOD RIGHT HAND  Result Value Ref Range Status   Specimen Description BLOOD RIGHT HAND  Final   Special Requests   Final    BOTTLES DRAWN AEROBIC ONLY Blood Culture adequate volume   Culture    Final    NO GROWTH 2 DAYS Performed at [facility], [place] [address] [place], [place] [address]    Report Status PENDING  Incomplete  Culture, Urine     Status: Abnormal (Preliminary result)   Collection Time: 01/11/19  2:55 PM   Specimen: Urine, Clean Catch  Result Value Ref Range Status   Specimen Description URINE, CLEAN CATCH  Final   Special Requests NONE  Final   Culture (A)  Final    40,000 COLONIES/mL STAPHYLOCOCCUS EPIDERMIDIS SUSCEPTIBILITIES TO FOLLOW Performed at [facility], [place] [address], [place], [place] [address]    Report Status PENDING  Incomplete    Radiology Reports Dg Chest 2 View  Result Date: 12/31/2018 CLINICAL DATA:  Sore throat, runny nose, cough and chills. EXAM: CHEST - 2 VIEW COMPARISON:  05/13/2013 FINDINGS: The heart size and mediastinal contours are within normal limits. Superimposed on chronic lung disease is some increased bronchial and interstitial prominence in the right lower lung which may be consistent with acute bronchitis and potentially early pneumonia. No pulmonary edema. The visualized skeletal structures are unremarkable. IMPRESSION: Increased bronchial and interstitial prominence in the right lower lung which may be consistent with acute bronchitis and potentially early pneumonia. Electronically Signed   By: Aletta Edouard M.D.   On: 12/31/2018 08:46   Dg Chest Port 1 View  Result Date: 01/11/2019 CLINICAL DATA:  Altered mental status EXAM: PORTABLE CHEST 1 VIEW COMPARISON:  Chest radiograph 12/31/2018 FINDINGS: Monitoring leads overlie the patient. Stable cardiac and mediastinal contours. Interval development of bilateral patchy consolidative opacities. No pleural effusion or pneumothorax. IMPRESSION: Bilateral patchy areas of consolidation may represent multifocal pneumonia or atypical/viral infectious process. Electronically Signed   By: Lovey Newcomer M.D.   On: 01/11/2019 08:49       Phillips Climes M.D on 01/13/2019 at  2:14 PM  Between 7am to 7pm - Pager - [phone]  After 7pm go to [email or web address] - password [facility]  [facility] -  Office  [phone]

## 2019-01-13 NOTE — Progress Notes (Signed)
Pharmacy Antibiotic Note  Francisco Williamson is a 73 y.o. male admitted on 01/11/2019 with UTI.  Pharmacy has been consulted for vanc  dosing.  Pt with a hx of renal tx who was admitted for COVID. His PCT was elevated at baseline. Dr Waldron Labs suspected that the UTI is a factor here. Urine culture grew out 40k of staph epi. With his renal function we will only need 2 doses of vanc to cover the 7d of therapy.   Plan: Vanc 1000mg  IV x1 today then 500mg  IV x1 in 3 days Rx will sign off  Height: 5\' 5"  (165.1 cm) Weight: 96 lb 6.4 oz (43.7 kg) IBW/kg (Calculated) : 61.5  Temp (24hrs), Avg:98 F (36.7 C), Min:97.7 F (36.5 C), Max:98.2 F (36.8 C)  Recent Labs  Lab 01/11/19 0800 01/11/19 1004 01/11/19 1145 01/12/19 0820 01/13/19 0334 01/13/19 1540  WBC 12.9*  --   --  10.2 8.0  --   CREATININE 6.60*  --   --  5.81* 5.12* 5.14*  LATICACIDVEN 2.3* 1.0 1.7  --   --   --     Estimated Creatinine Clearance: 8.1 mL/min (A) (by C-G formula based on SCr of 5.14 mg/dL (H)).    No Known Allergies  Antimicrobials this admission: 11/8 cefepime>> 11/10 vanc>> x2 doses  Dose adjustments this admission:   Microbiology results: 11/8 urine>>staph epi R septra, sens doxy, vanc 11/8 blood>>ngtd  Onnie Boer, PharmD, BCIDP, AAHIVP, CPP Infectious Disease Pharmacist 01/13/2019 5:55 PM

## 2019-01-13 NOTE — Evaluation (Signed)
Occupational Therapy Evaluation Patient Details Name: Francisco Williamson MRN: [id number] DOB: [birth year] Today's Date: 01/13/2019    History of Present Illness 73 yo male presenting with AMS and failure to thrive. Pt was admitted to [facility] from 10/28-11/3 for COVID + and PNA. 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   Clinical Impression   PTA, pt was living alone and was performing BADLs after prior admission to [place]; pt was independent and working prior to last admission. Pt currently requiring Min A for LB ADLs and functional mobility due to decreased strength and balance. Pt presenting with decreased cognition and awareness. Pt also with several LOB episodes and able ot self correct with Min A for safety. SpO2 fluctuating between 94%-86% on RA during activity. Pt would benefit from further acute OT to facilitate safe dc. Recommend dc to home with 24/7 support and HHOT for further OT to optimize safety, independence with ADLs, and return to PLOF; pt will need increased support for IADLs and safety.     Follow Up Recommendations  Home health OT;Supervision/Assistance - 24 hour ; Will require 24/7 and if unable, then he may need SNF for rehab   Equipment Recommendations  3 in 1 bedside commode;Tub/shower seat    Recommendations for Other Services PT consult     Precautions / Restrictions Precautions Precautions: Fall Restrictions Weight Bearing Restrictions: No      Mobility Bed Mobility Overal bed mobility: Modified Independent             General bed mobility comments: Increased time and effort  Transfers Overall transfer level: Needs assistance Equipment used: None Transfers: Sit to/from Stand Sit to Stand: Min guard         General transfer comment: Min Guard A for safety.     Balance Overall balance assessment: Mild deficits observed, not formally tested(Several moments with LOB but able to self correct)                                          ADL either performed or assessed with clinical judgement   ADL Overall ADL's : Needs assistance/impaired Eating/Feeding: Set up;Sitting   Grooming: Oral care;Wash/dry face;Min guard;Standing Grooming Details (indicate cue type and reason): Pt performing oral care at sink with Min Guard A for safety. Cues for placement of items to assist with baseline visual deficits. SpO2 flucuating between 94-86% on RA during oral care.  Upper Body Bathing: Set up;Supervision/ safety;Sitting   Lower Body Bathing: Minimal assistance;Sit to/from stand Lower Body Bathing Details (indicate cue type and reason): Min A for standing balance as pt perform peri care at sink. Upper Body Dressing : Set up;Supervision/safety;Sitting   Lower Body Dressing: Minimal assistance;Sit to/from stand Lower Body Dressing Details (indicate cue type and reason): Min A for balance during dynamic standing Toilet Transfer: Min guard;Ambulation(Simulated to recliner) Armed forces technical officer Details (indicate cue type and reason): Min Guard A for safety in standing Toileting- Clothing Manipulation and Hygiene: Minimal assistance Toileting - Clothing Manipulation Details (indicate cue type and reason): Min A for balance in standing     Functional mobility during ADLs: Minimal assistance(Several LOB and able to self correct) General ADL Comments: Pt with decreased balance, strength, and activity tolerance     Vision Baseline Vision/History: (Blind in left eye) Patient Visual Report: No change from baseline       Perception  Praxis      Pertinent Vitals/Pain Pain Assessment: No/denies pain     Hand Dominance (Both)   Extremity/Trunk Assessment Upper Extremity Assessment Upper Extremity Assessment: Generalized weakness   Lower Extremity Assessment Lower Extremity Assessment: Generalized weakness   Cervical / Trunk Assessment Cervical / Trunk Assessment: Normal   Communication  Communication Communication: No difficulties   Cognition Arousal/Alertness: Awake/alert Behavior During Therapy: Flat affect Overall Cognitive Status: Impaired/Different from baseline Area of Impairment: Memory;Problem solving;Awareness                     Memory: Decreased short-term memory     Awareness: Emergent Problem Solving: Slow processing;Requires verbal cues General Comments: Pt reporting he knows he is at the [facility] for a second time because his friend brought him to [facility], but does not know why he had to come back. Pt also repeating certain topics and required increased time throughout session.    General Comments  SpO2 flucuating between 94%-86% on RA during ADLs.. At end of session, HR 80s, SpO2 90s, and RR 30.     Exercises     Shoulder Instructions      Home Living Family/patient expects to be discharged to:: Private residence Living Arrangements: Alone Available Help at Discharge: Family Type of Home: House Home Access: Stairs to enter Technical brewer of Steps: 3   Home Layout: One level     Bathroom Shower/Tub: Teacher, early years/pre: Standard     Home Equipment: None          Prior Functioning/Environment Level of Independence: Independent        Comments: Works at the [employer] List: Decreased strength;Decreased activity tolerance;Decreased range of motion;Impaired balance (sitting and/or standing);Impaired vision/perception;Decreased cognition;Decreased safety awareness;Decreased knowledge of use of DME or AE;Decreased knowledge of precautions      OT Treatment/Interventions: Self-care/ADL training;Therapeutic exercise;Energy conservation;DME and/or AE instruction;Therapeutic activities;Patient/family education    OT Goals(Current goals can be found in the care plan section) Acute Rehab OT Goals Patient Stated Goal: "Go home soon" OT Goal Formulation: With patient Time  For Goal Achievement: 01/27/19 Potential to Achieve Goals: Good  OT Frequency: Min 3X/week   Barriers to D/C:            Co-evaluation              AM-PAC OT "6 Clicks" Daily Activity     Outcome Measure Help from another person eating meals?: None Help from another person taking care of personal grooming?: A Little Help from another person toileting, which includes using toliet, bedpan, or urinal?: A Little Help from another person bathing (including washing, rinsing, drying)?: A Little Help from another person to put on and taking off regular upper body clothing?: None Help from another person to put on and taking off regular lower body clothing?: A Little 6 Click Score: 20   End of Session Equipment Utilized During Treatment: Gait belt Nurse Communication: Mobility status;Other (comment)(Alarm box)  Activity Tolerance: Patient tolerated treatment well;Patient limited by fatigue Patient left: in chair;with call bell/phone within reach;with chair alarm set(Chair alarm box not plugged into wall; RN notified)  OT Visit Diagnosis: Unsteadiness on feet (R26.81);Other abnormalities of gait and mobility (R26.89);Muscle weakness (generalized) (M62.81);Other symptoms and signs involving cognitive function                Time: [id number] OT Time Calculation (min): 35 min Charges:  OT General Charges $OT Visit: 1 Visit OT Evaluation $OT Eval Moderate Complexity: 1 Mod OT Treatments $Self Care/Home Management : 8-22 mins  Ceylon Arenson MSOT, OTR/L Acute Rehab Pager: [phone] Office: [place] 01/13/2019, 10:38 AM

## 2019-01-13 NOTE — Progress Notes (Signed)
PT Cancellation Note  Patient Details Name: Krist Bucholz MRN: [id number] DOB: 10/09/1945   Cancelled Treatment:    Reason Eval/Treat Not Completed: Fatigue/lethargy limiting ability to participate, check back tomorrow.   Claretha Cooper 01/13/2019, 4:10 PM

## 2019-01-13 NOTE — Progress Notes (Signed)
Initial Nutrition Assessment  DOCUMENTATION CODES:   Underweight  INTERVENTION:   Ensure Enlive po TID, each supplement provides 350 kcal and 20 grams of protein  Pt receiving Hormel Shake daily with Breakfast which provides 520 kcals and 22 g of protein and Magic cup BID with lunch and dinner, each supplement provides 290 kcal and 9 grams of protein, automatically on meal trays to optimize nutritional intake.   Encourage PO intake   NUTRITION DIAGNOSIS:   Increased nutrient needs related to (COVID-19 PNA) as evidenced by estimated needs.  GOAL:   Patient will meet greater than or equal to 90% of their needs  MONITOR:   PO intake, Supplement acceptance, Weight trends  REASON FOR ASSESSMENT:   Consult Assessment of nutrition requirement/status  ASSESSMENT:   Pt with PMH of HTN and stage V CKD s/p renal transplantation presenting with AMS.  He was admitted to [facility] from 10/28-11/3 for COVID-19-associated PNA with acute respiratory failure with hypoxia.  He completed a course of Remdesivir and steroids, and he was discharged home on 11/3, patient was brought back to ED, given failure to thrive, dehydration and altered mental status, he was noted to have significant dehydration with hypernatremia and renal failure, he was transferred to [facility] for further management.  Plan for home health with 24 hour supervision vs SNF at discharge  Medications reviewed and include: colace, solu-cortef,  Labs reviewed: K+ 3.2 (L)   Unable to complete physical exam due to pandemic. Pt likely meets criteria for malnutrition.   NUTRITION - FOCUSED PHYSICAL EXAM:  Deferred; RD working remotely   Diet Order:   Diet Order            Diet clear liquid Room service appropriate? Yes; Fluid consistency: Thin  Diet effective now              EDUCATION NEEDS:   Not appropriate for education at this time  Skin:  Skin Assessment: Reviewed RN Assessment  Last BM:  11/10  Height:    Ht Readings from Last 1 Encounters:  01/12/19 5\' 5"  (1.651 m)    Weight:   Wt Readings from Last 1 Encounters:  01/12/19 43.7 kg    Ideal Body Weight:     BMI:  Body mass index is 16.04 kg/m.  Estimated Nutritional Needs:   Kcal:  1500-1700  Protein:  75-90 grams  Fluid:  > 1.5 L/day  Maylon Peppers RD, LDN, CNSC [phone] Pager [phone] After Hours Pager

## 2019-01-13 NOTE — Progress Notes (Signed)
Daughter Francisco Williamson called from [place] and spoke with patient. I also called his friend, Mattie, on speakerphone, both were appreciative of being able to speak to each other. Mattie does NOT want patient to go to a SNF and patient is also refusing to go to a SNF as well. He is asking for home care services. His caregiver, Lorna Dibble was very upset at the thought of a nursing home. She stated "I will go to hell before I let you go to a nursing home!".

## 2019-01-13 NOTE — Progress Notes (Signed)
Spoke with niece, gave update

## 2019-01-14 ENCOUNTER — Inpatient Hospital Stay (HOSPITAL_COMMUNITY): Payer: Medicare Other

## 2019-01-14 DIAGNOSIS — N390 Urinary tract infection, site not specified: Secondary | ICD-10-CM

## 2019-01-14 DIAGNOSIS — A419 Sepsis, unspecified organism: Secondary | ICD-10-CM | POA: Diagnosis present

## 2019-01-14 DIAGNOSIS — I120 Hypertensive chronic kidney disease with stage 5 chronic kidney disease or end stage renal disease: Secondary | ICD-10-CM

## 2019-01-14 DIAGNOSIS — I361 Nonrheumatic tricuspid (valve) insufficiency: Secondary | ICD-10-CM

## 2019-01-14 DIAGNOSIS — J189 Pneumonia, unspecified organism: Secondary | ICD-10-CM

## 2019-01-14 LAB — MAGNESIUM: Magnesium: 2.2 mg/dL (ref 1.7–2.4)

## 2019-01-14 LAB — COMPREHENSIVE METABOLIC PANEL
ALT: 20 U/L (ref 0–44)
AST: 38 U/L (ref 15–41)
Albumin: 1.6 g/dL — ABNORMAL LOW (ref 3.5–5.0)
Alkaline Phosphatase: 47 U/L (ref 38–126)
Anion gap: 11 (ref 5–15)
BUN: 77 mg/dL — ABNORMAL HIGH (ref 8–23)
CO2: 22 mmol/L (ref 22–32)
Calcium: 7.4 mg/dL — ABNORMAL LOW (ref 8.9–10.3)
Chloride: 104 mmol/L (ref 98–111)
Creatinine, Ser: 4.84 mg/dL — ABNORMAL HIGH (ref 0.61–1.24)
GFR calc Af Amer: 13 mL/min — ABNORMAL LOW (ref >60)
GFR calc non Af Amer: 11 mL/min — ABNORMAL LOW (ref >60)
Glucose, Bld: 389 mg/dL — ABNORMAL HIGH (ref 70–99)
Potassium: 3.6 mmol/L (ref 3.5–5.1)
Sodium: 137 mmol/L (ref 135–145)
Total Bilirubin: 0.8 mg/dL (ref 0.3–1.2)
Total Protein: 6 g/dL — ABNORMAL LOW (ref 6.5–8.1)

## 2019-01-14 LAB — CBC WITH DIFFERENTIAL/PLATELET
Abs Immature Granulocytes: 0.05 10*3/uL (ref 0.00–0.07)
Basophils Absolute: 0 10*3/uL (ref 0.0–0.1)
Basophils Relative: 0 %
Eosinophils Absolute: 0 10*3/uL (ref 0.0–0.5)
Eosinophils Relative: 0 %
HCT: 22.1 % — ABNORMAL LOW (ref 39.0–52.0)
Hemoglobin: 7.2 g/dL — ABNORMAL LOW (ref 13.0–17.0)
Immature Granulocytes: 1 %
Lymphocytes Relative: 9 %
Lymphs Abs: 0.8 10*3/uL (ref 0.7–4.0)
MCH: 28.5 pg (ref 26.0–34.0)
MCHC: 32.6 g/dL (ref 30.0–36.0)
MCV: 87.4 fL (ref 80.0–100.0)
Monocytes Absolute: 0.4 10*3/uL (ref 0.1–1.0)
Monocytes Relative: 4 %
Neutro Abs: 7.7 10*3/uL (ref 1.7–7.7)
Neutrophils Relative %: 86 %
Platelets: 120 10*3/uL — ABNORMAL LOW (ref 150–400)
RBC: 2.53 MIL/uL — ABNORMAL LOW (ref 4.22–5.81)
RDW: 17.2 % — ABNORMAL HIGH (ref 11.5–15.5)
WBC: 8.9 10*3/uL (ref 4.0–10.5)
nRBC: 0 % (ref 0.0–0.2)

## 2019-01-14 LAB — GLUCOSE, CAPILLARY
Glucose-Capillary: 152 mg/dL — ABNORMAL HIGH (ref 70–99)
Glucose-Capillary: 295 mg/dL — ABNORMAL HIGH (ref 70–99)
Glucose-Capillary: 304 mg/dL — ABNORMAL HIGH (ref 70–99)
Glucose-Capillary: 345 mg/dL — ABNORMAL HIGH (ref 70–99)

## 2019-01-14 LAB — HEMOGLOBIN AND HEMATOCRIT, BLOOD
HCT: 25.1 % — ABNORMAL LOW (ref 39.0–52.0)
HCT: 26.8 % — ABNORMAL LOW (ref 39.0–52.0)
Hemoglobin: 8.1 g/dL — ABNORMAL LOW (ref 13.0–17.0)
Hemoglobin: 8.7 g/dL — ABNORMAL LOW (ref 13.0–17.0)

## 2019-01-14 LAB — C-REACTIVE PROTEIN: CRP: 11.9 mg/dL — ABNORMAL HIGH (ref <1.0)

## 2019-01-14 LAB — D-DIMER, QUANTITATIVE: D-Dimer, Quant: 1.19 ug/mL-FEU — ABNORMAL HIGH (ref 0.00–0.50)

## 2019-01-14 LAB — PROCALCITONIN: Procalcitonin: 26.99 ng/mL

## 2019-01-14 LAB — FERRITIN: Ferritin: >7500 ng/mL — ABNORMAL HIGH (ref 24–336)

## 2019-01-14 LAB — ECHOCARDIOGRAM COMPLETE
Height: 65 in
Weight: 1542.4 oz

## 2019-01-14 LAB — PHOSPHORUS: Phosphorus: 3.7 mg/dL (ref 2.5–4.6)

## 2019-01-14 MED ORDER — INSULIN ASPART 100 UNIT/ML ~~LOC~~ SOLN
0.0000 [IU] | SUBCUTANEOUS | Status: DC
  Administered 2019-01-14 (×2): 7 [IU] via SUBCUTANEOUS
  Administered 2019-01-14: 5 [IU] via SUBCUTANEOUS
  Administered 2019-01-15 (×4): 2 [IU] via SUBCUTANEOUS
  Administered 2019-01-15 – 2019-01-16 (×2): 1 [IU] via SUBCUTANEOUS
  Administered 2019-01-16 (×2): 2 [IU] via SUBCUTANEOUS
  Administered 2019-01-17: 9 [IU] via SUBCUTANEOUS
  Administered 2019-01-17: 08:00:00 1 [IU] via SUBCUTANEOUS
  Administered 2019-01-17: 12:00:00 2 [IU] via SUBCUTANEOUS
  Administered 2019-01-17: 05:00:00 1 [IU] via SUBCUTANEOUS
  Administered 2019-01-18: 09:00:00 2 [IU] via SUBCUTANEOUS
  Administered 2019-01-18 (×2): 1 [IU] via SUBCUTANEOUS
  Administered 2019-01-18: 16:00:00 2 [IU] via SUBCUTANEOUS
  Administered 2019-01-19: 3 [IU] via SUBCUTANEOUS
  Administered 2019-01-19: 08:00:00 2 [IU] via SUBCUTANEOUS
  Administered 2019-01-19: 3 [IU] via SUBCUTANEOUS
  Administered 2019-01-19: 1 [IU] via SUBCUTANEOUS
  Administered 2019-01-19: 04:00:00 2 [IU] via SUBCUTANEOUS
  Administered 2019-01-20: 5 [IU] via SUBCUTANEOUS
  Administered 2019-01-20: 05:00:00 2 [IU] via SUBCUTANEOUS
  Administered 2019-01-20: 3 [IU] via SUBCUTANEOUS
  Administered 2019-01-20 (×2): 5 [IU] via SUBCUTANEOUS
  Administered 2019-01-20: 1 [IU] via SUBCUTANEOUS
  Administered 2019-01-21: 7 [IU] via SUBCUTANEOUS
  Administered 2019-01-21 (×2): 2 [IU] via SUBCUTANEOUS
  Administered 2019-01-21: 3 [IU] via SUBCUTANEOUS
  Administered 2019-01-21: 2 [IU] via SUBCUTANEOUS
  Administered 2019-01-22: 3 [IU] via SUBCUTANEOUS
  Administered 2019-01-22: 1 [IU] via SUBCUTANEOUS
  Administered 2019-01-22: 2 [IU] via SUBCUTANEOUS
  Administered 2019-01-22: 1 [IU] via SUBCUTANEOUS
  Administered 2019-01-22: 2 [IU] via SUBCUTANEOUS
  Administered 2019-01-23: 3 [IU] via SUBCUTANEOUS
  Administered 2019-01-23: 1 [IU] via SUBCUTANEOUS
  Administered 2019-01-23: 3 [IU] via SUBCUTANEOUS
  Administered 2019-01-23: 1 [IU] via SUBCUTANEOUS

## 2019-01-14 MED ORDER — SODIUM CHLORIDE 0.45 % IV SOLN
INTRAVENOUS | Status: DC
  Administered 2019-01-14 – 2019-01-15 (×2): via INTRAVENOUS
  Filled 2019-01-14 (×7): qty 1000

## 2019-01-14 NOTE — Progress Notes (Signed)
  Echocardiogram 2D Echocardiogram has been performed.  Darlina Sicilian M 01/14/2019, 7:55 AM

## 2019-01-14 NOTE — Progress Notes (Addendum)
Patient had another BM with blood in it. MD made aware, verbal orders for timed H&H at 14:00.

## 2019-01-14 NOTE — Progress Notes (Signed)
PROGRESS NOTE    Francisco Williamson  [id number] DOB: 1945-09-09 DOA: 01/11/2019 PCP: Default, Provider, MD   Brief Narrative:  73 y.o.BM PMHx Essential HTN and CKD stage V (s/p renal transplantation), deconditioning,  Presenting with AMS. He was admitted to [facility] from 10/28-11/3 for COVID-19-associated PNA with acute respiratory failure with hypoxia. He completed a course of Remdesivir and steroids, and he was discharged home on 11/3, patient was brought back to ED, given failure to thrive, dehydration and altered mental status, he was noted to have significant dehydration with hypernatremia and renal failure, he was transferred to [facility] for further management.    Subjective: 11/11 A/O x4, negative S OB, negative CP, negative abdominal pain.  States he realizes the reason he is back in the hospital secondary to not eating and drinking sufficiently at home.   Assessment & Plan:   Principal Problem:   Acute renal failure superimposed on chronic kidney disease ([place]) Active Problems:   CKD stage 5 secondary to hypertension ([place])   Pneumonia due to COVID-19 virus   Essential hypertension   History of renal transplant   Severe sepsis (HCC)   Sepsis secondary to UTI ([place])   HCAP (healthcare-associated pneumonia)   Sepsis due to staph epidermidis UTI -Afebrile, negative leukocytosis.  Will receive 2 doses of vancomycin but given his renal function is = 7 doses. -Hold all nephrotoxic medication -Bactrim prophylaxis for his renal transplant -CellCept 500 mg  BID for renal transplant  -Tacrolimus 4 mg  BID renal transplant -Given patient's deteriorating renal function will obtain Tacrolimus and CellCept level  Acute on CKD stage V/RENAL transplant patient (Cr = 3.5) Recent Labs  Lab 01/11/19 0800 01/12/19 0820 01/13/19 0334 01/13/19 1540 01/14/19 0100  CREATININE 6.60* 5.81* 5.12* 5.14* 4.84*  -See sepsis UTI -0.45% saline + KCl 40 mEq 57ml/hr  HCAP/Covid 19 pneumonia   -On previous admission patient completed treatment of remdesivir+ steroids -Not fully convinced patient has HCAP, however patient has elevated procalcitonin level Procalcitonin 11/8 (42.95), 11/11 (26.99), complete 7-day course cefepime COVID-19 Labs  Recent Labs    01/11/19 1700 01/12/19 0820 01/13/19 0334 01/14/19 0100  DDIMER 2.09* 2.92* 1.98* 1.19*  FERRITIN  --  >7,500* >7,500* >7,500*  CRP 20.8*  --  20.7* 11.9*    Lab Results  Component Value Date   SARSCOV2NAA POSITIVE (A) 12/31/2018    Hypernatremia -See renal failure  Hypokalemia -Potassium goal>4 -See renal failure  Hypomagnesmia -Magnesium goal> 2  Hyperglycemia -Sensitive SSI  Essential HTN -Amlodipine 10 mg daily -Clonidine 0.2 mg BID -BP acceptable especially given patient's GI bleed.  GI bleed -Per EMR overnight 1910: Patient had small amount of blood with his stool, will hold his heparin subcu, will repeat CBC, will get type and screen , and start protonix 40 mg IV BID. -H/H  TID -Transfuse for hemoglobin<7   Failure to thrive -Patient unable to care for himself at home.  Discharged on 11/3 and readmitted on 11/8.  Patient now with worsening acute on CKD stage V.  Clear that if patient goes home instead of to a SNF is at high risk for complete renal failure of his transplanted kidney.   DVT prophylaxis: SCD Code Status: DNR Family Communication:  Disposition Plan: SNF?   Consultants:    Procedures/Significant Events:  11/8 PCXR; bilateral patchy areas of consolidation may represent multifocal pneumonia vs atypical/viral infectious process.   I have personally reviewed and interpreted all radiology studies and my findings are as above.  VENTILATOR SETTINGS:  Cultures   Antimicrobials: Anti-infectives (From admission, onward)   Start     Stop   01/16/19 1800  vancomycin (VANCOCIN) 500 mg in sodium chloride 0.9 % 100 mL IVPB         01/13/19 1900  vancomycin (VANCOCIN)  IVPB 1000 mg/200 mL premix     01/13/19 2200   01/12/19 1800  ceFEPIme (MAXIPIME) 1 g in sodium chloride 0.9 % 100 mL IVPB         01/12/19 1600  sulfamethoxazole-trimethoprim (BACTRIM) 400-80 MG per tablet 1 tablet    Note to Pharmacy: Take one tablet by mouth on Monday, Wednesday and fridays.         01/11/19 0830  vancomycin (VANCOCIN) IVPB 1000 mg/200 mL premix  Status:  Discontinued     01/11/19 0901   01/11/19 0830  ceFEPIme (MAXIPIME) 2 g in sodium chloride 0.9 % 100 mL IVPB     01/11/19 0952       Devices    LINES / TUBES:      Continuous Infusions: . sodium chloride    . sodium chloride 50 mL/hr at 01/13/19 1909  . ceFEPime (MAXIPIME) IV Stopped (01/13/19 1824)  . sodium chloride 0.45 % with kcl    . [START ON 01/16/2019] vancomycin       Objective: Vitals:   01/14/19 0600 01/14/19 0800 01/14/19 0840 01/14/19 1125  BP: (!) 150/71 129/70  (!) 160/73  Pulse: 75 71  78  Resp: 19 (!) 21    Temp:   97.7 F (36.5 C)   TempSrc:   Oral   SpO2: 90% 99%  100%  Weight:      Height:        Intake/Output Summary (Last 24 hours) at 01/14/2019 1433 Last data filed at 01/14/2019 [id number] Gross per 24 hour  Intake 1585.11 ml  Output 800 ml  Net 785.11 ml   Filed Weights   01/12/19 0600  Weight: 43.7 kg    Examination:  General: A/O x4, no acute respiratory distress, cachectic Eyes: negative scleral hemorrhage, negative anisocoria, negative icterus ENT: Negative Runny nose, negative gingival bleeding, Neck:  Negative scars, masses, torticollis, lymphadenopathy, JVD Lungs: Tachypneic, clear to auscultation bilaterally without wheezes or crackles Cardiovascular: Regular rate and rhythm without murmur gallop or rub normal S1 and S2 Abdomen: negative abdominal pain, nondistended, positive soft, bowel sounds, no rebound, no ascites, no appreciable mass Extremities: No significant cyanosis, clubbing, or edema bilateral lower extremities Skin: Negative rashes, lesions,  ulcers Psychiatric:  Negative depression, negative anxiety, negative fatigue, negative mania  Central nervous system:  Cranial nerves II through XII intact, tongue/uvula midline, all extremities muscle strength 5/5, sensation intact throughout, , negative dysarthria, negative expressive aphasia, negative receptive aphasia.  .     Data Reviewed: Care during the described time interval was provided by me .  I have reviewed this patient's available data, including medical history, events of note, physical examination, and all test results as part of my evaluation.   CBC: Recent Labs  Lab 01/11/19 0800 01/11/19 0839 01/12/19 0820 01/13/19 0334 01/13/19 2025 01/14/19 0100  WBC 12.9*  --  10.2 8.0 8.6 8.9  NEUTROABS 9.9*  --  9.0* 6.9  --  7.7  HGB 11.0* 9.9* 9.8* 8.4* 7.6* 7.2*  HCT 34.2* 29.0* 29.9* 25.8* 23.0* 22.1*  MCV 90.5  --  88.5 87.8 87.1 87.4  PLT 192  --  152 127* 115* 123456*   Basic Metabolic Panel: Recent Labs  Lab 01/11/19 0800  01/11/19 0839 01/12/19 0820 01/13/19 0334 01/13/19 1540 01/14/19 0100  NA 153* 155* 157* 147* 141 137  K 4.8 4.5 3.9 3.2* 3.4* 3.6  CL 125*  --  119* 108 104 104  CO2 16*  --  26 28 25 22   GLUCOSE 87  --  237* 234* 378* 389*  BUN 87*  --  81* 74* 77* 77*  CREATININE 6.60*  --  5.81* 5.12* 5.14* 4.84*  CALCIUM 8.4*  --  7.8* 7.4* 7.4* 7.4*  MG  --   --  1.8 1.5*  --  2.2  PHOS  --   --  3.8 4.1  --  3.7   GFR: Estimated Creatinine Clearance: 8.7 mL/min (A) (by C-G formula based on SCr of 4.84 mg/dL (H)). Liver Function Tests: Recent Labs  Lab 01/11/19 0800 01/12/19 0820 01/13/19 0334 01/14/19 0100  AST 30 34 28 38  ALT 22 16 17 20   ALKPHOS 49 46 44 47  BILITOT 3.6* 2.1* 1.5* 0.8  PROT 7.8 7.4 6.7 6.0*  ALBUMIN 2.1* 2.0* 1.7* 1.6*   No results for input(s): LIPASE, AMYLASE in the last 168 hours. No results for input(s): AMMONIA in the last 168 hours. Coagulation Profile: Recent Labs  Lab 01/11/19 0800  INR 1.5*    Cardiac Enzymes: No results for input(s): CKTOTAL, CKMB, CKMBINDEX, TROPONINI in the last 168 hours. BNP (last 3 results) No results for input(s): PROBNP in the last 8760 hours. HbA1C: No results for input(s): HGBA1C in the last 72 hours. CBG: Recent Labs  Lab 01/11/19 0824 01/14/19 1156  GLUCAP 72 295*   Lipid Profile: No results for input(s): CHOL, HDL, LDLCALC, TRIG, CHOLHDL, LDLDIRECT in the last 72 hours. Thyroid Function Tests: No results for input(s): TSH, T4TOTAL, FREET4, T3FREE, THYROIDAB in the last 72 hours. Anemia Panel: Recent Labs    01/13/19 0334 01/14/19 0100  FERRITIN >7,500* >7,500*   Urine analysis:    Component Value Date/Time   COLORURINE AMBER (A) 01/11/2019 1455   APPEARANCEUR CLOUDY (A) 01/11/2019 1455   LABSPEC 1.013 01/11/2019 1455   PHURINE 5.0 01/11/2019 1455   GLUCOSEU 50 (A) 01/11/2019 1455   HGBUR MODERATE (A) 01/11/2019 1455   BILIRUBINUR NEGATIVE 01/11/2019 1455   KETONESUR NEGATIVE 01/11/2019 1455   PROTEINUR 100 (A) 01/11/2019 1455   UROBILINOGEN 1.0 12/20/2014 1252   NITRITE NEGATIVE 01/11/2019 1455   LEUKOCYTESUR NEGATIVE 01/11/2019 1455   Sepsis Labs: @LABRCNTIP (procalcitonin:4,lacticidven:4)  ) Recent Results (from the past 240 hour(s))  Blood Culture (routine x 2)     Status: None (Preliminary result)   Collection Time: 01/11/19  8:00 AM   Specimen: BLOOD LEFT FOREARM  Result Value Ref Range Status   Specimen Description BLOOD LEFT FOREARM  Final   Special Requests   Final    BOTTLES DRAWN AEROBIC AND ANAEROBIC Blood Culture adequate volume   Culture   Final    NO GROWTH 3 DAYS Performed at [facility], [place] [address], [place], [place] [address]    Report Status PENDING  Incomplete  Blood Culture (routine x 2)     Status: None (Preliminary result)   Collection Time: 01/11/19  8:15 AM   Specimen: BLOOD RIGHT HAND  Result Value Ref Range Status   Specimen Description BLOOD RIGHT HAND  Final   Special Requests    Final    BOTTLES DRAWN AEROBIC ONLY Blood Culture adequate volume   Culture   Final    NO GROWTH 3 DAYS Performed at [facility]  [facility], [place] [address] [place], [place] [address]    Report Status PENDING  Incomplete  Culture, Urine     Status: Abnormal   Collection Time: 01/11/19  2:55 PM   Specimen: Urine, Clean Catch  Result Value Ref Range Status   Specimen Description URINE, CLEAN CATCH  Final   Special Requests   Final    NONE Performed at [facility], [place] [address] [place], [place] [address]    Culture 40,000 COLONIES/mL STAPHYLOCOCCUS EPIDERMIDIS (A)  Final   Report Status 01/13/2019 FINAL  Final   Organism ID, Bacteria STAPHYLOCOCCUS EPIDERMIDIS (A)  Final      Susceptibility   Staphylococcus epidermidis - MIC*    CIPROFLOXACIN >=8 RESISTANT Resistant     GENTAMICIN <=0.5 SENSITIVE Sensitive     NITROFURANTOIN <=16 SENSITIVE Sensitive     OXACILLIN >=4 RESISTANT Resistant     TETRACYCLINE 2 SENSITIVE Sensitive     VANCOMYCIN 1 SENSITIVE Sensitive     TRIMETH/SULFA 160 RESISTANT Resistant     CLINDAMYCIN >=8 RESISTANT Resistant     RIFAMPIN <=0.5 SENSITIVE Sensitive     Inducible Clindamycin NEGATIVE Sensitive     * 40,000 COLONIES/mL STAPHYLOCOCCUS EPIDERMIDIS         Radiology Studies: No results found.      Scheduled Meds: . amLODipine  10 mg Oral Daily  . aspirin EC  81 mg Oral Daily  . cloNIDine  0.2 mg Oral BID  . docusate sodium  100 mg Oral BID  . feeding supplement (ENSURE ENLIVE)  237 mL Oral TID BM  . hydrocortisone sod succinate (SOLU-CORTEF) inj  50 mg Intravenous Q6H  . insulin aspart  0-9 Units Subcutaneous Q4H  . Ipratropium-Albuterol  1 puff Inhalation Q6H  . mouth rinse  15 mL Mouth Rinse BID  . metoprolol tartrate  12.5 mg Oral BID  . mycophenolate  500 mg Oral BID  . pantoprazole (PROTONIX) IV  40 mg Intravenous Q12H  . sodium chloride flush  3 mL Intravenous Q12H  . sulfamethoxazole-trimethoprim  1 tablet Oral Q M,W,F   . tacrolimus  4 mg Oral BID   Continuous Infusions: . sodium chloride    . sodium chloride 50 mL/hr at 01/13/19 1909  . ceFEPime (MAXIPIME) IV Stopped (01/13/19 1824)  . sodium chloride 0.45 % with kcl    . [START ON 01/16/2019] vancomycin       LOS: 3 days   The patient is critically ill with multiple organ systems failure and requires high complexity decision making for assessment and support, frequent evaluation and titration of therapies, application of advanced monitoring technologies and extensive interpretation of multiple databases. Critical Care Time devoted to patient care services described in this note  Time spent: 40 minutes     Wymon Swaney, Geraldo Docker, MD [facility] Pager [phone]  If 7PM-7AM, please contact night-coverage [email or web address] Password [facility] 01/14/2019, 2:33 PM

## 2019-01-14 NOTE — Progress Notes (Signed)
Patient had large BM on BSC with moderate amount of frank red blood, also noted that his morning H&H dropped to 7.2/22.1. Text message sent through Amion to the oncall MD to make aware.

## 2019-01-14 NOTE — Evaluation (Signed)
Physical Therapy Evaluation Patient Details Name: Francisco Williamson MRN: [id number] DOB: [birth year] Today's Date: 01/14/2019   History of Present Illness  73 yo male presenting with AMS and failure to thrive, renal failure.Pt was admitted to [facility] from 10/28-11/3 for COVID + and PNA. 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  Clinical Impression  The patient ambulated x 200' with minguard assistance. HR 109, on RA. Patient requires some visual guidance at times. Patient reports having no appetite. Patient  Endorses desire to return home and will eat and drink. Patient  apparanatly high risk for return unless has better care and nutrition post DC. Patient would benefit from short rehab stay to return to independent level. Pt admitted with above diagnosis.  Pt currently with functional limitations due to the deficits listed below (see PT Problem List). Pt will benefit from skilled PT to increase their independence and safety with mobility to allow discharge to the venue listed below.       Follow Up Recommendations SNF    Equipment Recommendations  None recommended by PT    Recommendations for Other Services       Precautions / Restrictions Precautions Precautions: Fall      Mobility  Bed Mobility Overal bed mobility: Modified Independent                Transfers   Equipment used: None Transfers: Sit to/from Stand Sit to Stand: Min guard         General transfer comment: Min Guard A for safety.   Ambulation/Gait Ambulation/Gait assistance: Min guard Gait Distance (Feet): 200 Feet Assistive device: 1 person hand held assist;None Gait Pattern/deviations: Step-through pattern;Decreased stride length Gait velocity: decr   General Gait Details: intermittent HHA for turns., guidance as pt. appears with low vision.  Stairs            Wheelchair Mobility    Modified Rankin (Stroke Patients Only)       Balance Overall  balance assessment: Mild deficits observed, not formally tested                                           Pertinent Vitals/Pain Pain Assessment: No/denies pain    Home Living Family/patient expects to be discharged to:: Private residence Living Arrangements: Alone;Spouse/significant other Available Help at Discharge: Family;Available 24 hours/day Type of Home: House Home Access: Stairs to enter   [employer] of Steps: 3 Home Layout: One level Home Equipment: None Additional Comments: question of how much friend was ablw to assist as patient came back    Prior Function Level of Independence: Independent         Comments: Works at the [employer]        Extremity/Trunk Assessment   Upper Extremity Assessment Upper Extremity Assessment: Generalized weakness    Lower Extremity Assessment Lower Extremity Assessment: Generalized weakness    Cervical / Trunk Assessment Cervical / Trunk Assessment: Normal  Communication   Communication: No difficulties  Cognition Arousal/Alertness: Awake/alert Behavior During Therapy: Flat affect Overall Cognitive Status: Impaired/Different from baseline Area of Impairment: Memory;Problem solving;Awareness                     Memory: Decreased short-term memory     Awareness: Emergent Problem Solving: Slow processing;Requires verbal cues General Comments:  Pt reporting he knows he is at the [facility] for a second time because his friend brought him to [facility], but does not know why he had to come back. Pt also repeating certain topics and required increased time throughout session.       General Comments General comments (skin integrity, edema, etc.): noted in quick turns and around objects with ? low vision    Exercises     Assessment/Plan    PT Assessment Patient needs continued PT services  PT Problem List Decreased strength;Decreased activity  tolerance       PT Treatment Interventions Gait training;Patient/family education;Functional mobility training    PT Goals (Current goals can be found in the Care Plan section)  Acute Rehab PT Goals Patient Stated Goal: "Go home soon"    Frequency Min 2X/week   Barriers to discharge Decreased caregiver support      Co-evaluation               AM-PAC PT "6 Clicks" Mobility  Outcome Measure Help needed turning from your back to your side while in a flat bed without using bedrails?: None Help needed moving from lying on your back to sitting on the side of a flat bed without using bedrails?: None Help needed moving to and from a bed to a chair (including a wheelchair)?: A Little Help needed standing up from a chair using your arms (e.g., wheelchair or bedside chair)?: A Little Help needed to walk in hospital room?: A Little Help needed climbing 3-5 steps with a railing? : A Little 6 Click Score: 20    End of Session   Activity Tolerance: Patient tolerated treatment well Patient left: in chair;with call bell/phone within reach;with chair alarm set Nurse Communication: Mobility status PT Visit Diagnosis: Difficulty in walking, not elsewhere classified (R26.2);Adult, failure to thrive (R62.7)    Time: 1213-1236 PT Time Calculation (min) (ACUTE ONLY): 23 min   Charges:   PT Evaluation $PT Eval Low Complexity: 1 Low PT Treatments $Gait Training: 8-22 mins        Tresa Endo PT Acute Rehabilitation Services Pager [phone] Office [phone]   Claretha Cooper 01/14/2019, 2:41 PM

## 2019-01-14 NOTE — Plan of Care (Signed)

## 2019-01-14 NOTE — Progress Notes (Signed)
Inpatient Diabetes Program Recommendations  AACE/ADA: New Consensus Statement on Inpatient Glycemic Control (2015)  Target Ranges:  Prepandial:   less than 140 mg/dL      Peak postprandial:   less than 180 mg/dL (1-2 hours)      Critically ill patients:  140 - 180 mg/dL   Lab Results  Component Value Date   GLUCAP 72 01/11/2019   HGBA1C 4.2 (L) 01/06/2019    Review of Glycemic Control Results for ORVILL, EDE (MRN [id number]) as of 01/14/2019 09:31  Ref. Range 01/11/2019 08:00 01/12/2019 08:20 01/13/2019 03:34 01/13/2019 15:40 01/14/2019 01:00  Glucose Latest Ref Range: 70 - 99 mg/dL 87 237 (H) 234 (H) 378 (H) 389 (H)   Diabetes history: None  Current orders for Inpatient glycemic control: None  Inpatient Diabetes Program Recommendations:    A1c 4.2% on 11/3  Lab glucose 300's, Solucortef 50 mg Q6 hours.   Consider CBGs and Novolog 0-9 units Q6 hours.   If still elevated may consider very low dose basal insulin this evening.  Thanks,  Tama Headings RN, MSN, BC-ADM Inpatient Diabetes Coordinator Team Pager [phone] (8a-5p)

## 2019-01-15 ENCOUNTER — Inpatient Hospital Stay (HOSPITAL_COMMUNITY): Payer: Medicare Other

## 2019-01-15 LAB — CBC WITH DIFFERENTIAL/PLATELET
Abs Immature Granulocytes: 0.13 10*3/uL — ABNORMAL HIGH (ref 0.00–0.07)
Basophils Absolute: 0 10*3/uL (ref 0.0–0.1)
Basophils Relative: 0 %
Eosinophils Absolute: 0 10*3/uL (ref 0.0–0.5)
Eosinophils Relative: 0 %
HCT: 21.8 % — ABNORMAL LOW (ref 39.0–52.0)
Hemoglobin: 7.4 g/dL — ABNORMAL LOW (ref 13.0–17.0)
Immature Granulocytes: 1 %
Lymphocytes Relative: 4 %
Lymphs Abs: 0.6 10*3/uL — ABNORMAL LOW (ref 0.7–4.0)
MCH: 29 pg (ref 26.0–34.0)
MCHC: 33.9 g/dL (ref 30.0–36.0)
MCV: 85.5 fL (ref 80.0–100.0)
Monocytes Absolute: 0.4 10*3/uL (ref 0.1–1.0)
Monocytes Relative: 3 %
Neutro Abs: 13.4 10*3/uL — ABNORMAL HIGH (ref 1.7–7.7)
Neutrophils Relative %: 92 %
Platelets: 123 10*3/uL — ABNORMAL LOW (ref 150–400)
RBC: 2.55 MIL/uL — ABNORMAL LOW (ref 4.22–5.81)
RDW: 17.2 % — ABNORMAL HIGH (ref 11.5–15.5)
WBC: 14.5 10*3/uL — ABNORMAL HIGH (ref 4.0–10.5)
nRBC: 0 % (ref 0.0–0.2)

## 2019-01-15 LAB — COMPREHENSIVE METABOLIC PANEL
ALT: 38 U/L (ref 0–44)
AST: 60 U/L — ABNORMAL HIGH (ref 15–41)
Albumin: 1.6 g/dL — ABNORMAL LOW (ref 3.5–5.0)
Alkaline Phosphatase: 66 U/L (ref 38–126)
Anion gap: 12 (ref 5–15)
BUN: 86 mg/dL — ABNORMAL HIGH (ref 8–23)
CO2: 21 mmol/L — ABNORMAL LOW (ref 22–32)
Calcium: 7.4 mg/dL — ABNORMAL LOW (ref 8.9–10.3)
Chloride: 105 mmol/L (ref 98–111)
Creatinine, Ser: 4.83 mg/dL — ABNORMAL HIGH (ref 0.61–1.24)
GFR calc Af Amer: 13 mL/min — ABNORMAL LOW (ref >60)
GFR calc non Af Amer: 11 mL/min — ABNORMAL LOW (ref >60)
Glucose, Bld: 148 mg/dL — ABNORMAL HIGH (ref 70–99)
Potassium: 3.3 mmol/L — ABNORMAL LOW (ref 3.5–5.1)
Sodium: 138 mmol/L (ref 135–145)
Total Bilirubin: 0.6 mg/dL (ref 0.3–1.2)
Total Protein: 6.4 g/dL — ABNORMAL LOW (ref 6.5–8.1)

## 2019-01-15 LAB — MAGNESIUM
Magnesium: 2 mg/dL (ref 1.7–2.4)
Magnesium: 1.9 mg/dL (ref 1.7–2.4)

## 2019-01-15 LAB — HEMOGLOBIN AND HEMATOCRIT, BLOOD
HCT: 22.6 % — ABNORMAL LOW (ref 39.0–52.0)
HCT: 23.1 % — ABNORMAL LOW (ref 39.0–52.0)
Hemoglobin: 7.7 g/dL — ABNORMAL LOW (ref 13.0–17.0)
Hemoglobin: 7.6 g/dL — ABNORMAL LOW (ref 13.0–17.0)

## 2019-01-15 LAB — POTASSIUM: Potassium: 4.4 mmol/L (ref 3.5–5.1)

## 2019-01-15 LAB — MYCOPHENOLIC ACID (CELLCEPT)
MPA Glucuronide: 40 ug/mL (ref 15–125)
MPA: 0.4 ug/mL — ABNORMAL LOW (ref 1.0–3.5)

## 2019-01-15 LAB — PROCALCITONIN: Procalcitonin: 19.7 ng/mL

## 2019-01-15 LAB — PHOSPHORUS: Phosphorus: 2.1 mg/dL — ABNORMAL LOW (ref 2.5–4.6)

## 2019-01-15 LAB — GLUCOSE, CAPILLARY
Glucose-Capillary: 149 mg/dL — ABNORMAL HIGH (ref 70–99)
Glucose-Capillary: 195 mg/dL — ABNORMAL HIGH (ref 70–99)
Glucose-Capillary: 176 mg/dL — ABNORMAL HIGH (ref 70–99)
Glucose-Capillary: 148 mg/dL — ABNORMAL HIGH (ref 70–99)

## 2019-01-15 LAB — D-DIMER, QUANTITATIVE: D-Dimer, Quant: 1.13 ug/mL-FEU — ABNORMAL HIGH (ref 0.00–0.50)

## 2019-01-15 LAB — FERRITIN: Ferritin: 4995 ng/mL — ABNORMAL HIGH (ref 24–336)

## 2019-01-15 LAB — C-REACTIVE PROTEIN: CRP: 10.2 mg/dL — ABNORMAL HIGH (ref <1.0)

## 2019-01-15 MED ORDER — POTASSIUM CHLORIDE CRYS ER 20 MEQ PO TBCR
50.0000 meq | EXTENDED_RELEASE_TABLET | Freq: Once | ORAL | Status: AC
  Administered 2019-01-15: 11:00:00 50 meq via ORAL
  Filled 2019-01-15: qty 3

## 2019-01-15 MED ORDER — ALBUMIN HUMAN 25 % IV SOLN
50.0000 g | Freq: Once | INTRAVENOUS | Status: AC
  Administered 2019-01-15: 50 g via INTRAVENOUS
  Filled 2019-01-15: qty 200
  Filled 2019-01-15: qty 50

## 2019-01-15 MED ORDER — FUROSEMIDE 10 MG/ML IJ SOLN
60.0000 mg | Freq: Once | INTRAMUSCULAR | Status: AC
  Administered 2019-01-15: 18:00:00 60 mg via INTRAVENOUS
  Filled 2019-01-15: qty 6

## 2019-01-15 NOTE — Progress Notes (Signed)
Patient's respiration rate has remained in the 30's-40's. MD made aware, verbal orders for chest xray.

## 2019-01-15 NOTE — Plan of Care (Signed)
Pt slept well during the night. No complaints of pain verbalized. Alert and oriented. Vitals stable on RA. Minimal assist with ADLs. IV fluids with KCL continued. Assisted regular repositioning for pressure relief. Condom catheter draining well. No other issues, will monitor.   Problem: Health Behavior/Discharge Planning: Goal: Ability to manage health-related needs will improve Outcome: Progressing   Problem: Activity: Goal: Risk for activity intolerance will decrease Outcome: Progressing   Problem: Nutrition: Goal: Adequate nutrition will be maintained Outcome: Progressing   Problem: Coping: Goal: Level of anxiety will decrease Outcome: Progressing

## 2019-01-15 NOTE — Progress Notes (Signed)
   Vital Signs MEWS/VS Documentation      01/15/2019 0702 01/15/2019 0743 01/15/2019 0750 01/15/2019 0821   MEWS Score:  '1  2  2  3   '$ MEWS Score Color:  Green  Yellow  Yellow  Yellow   Resp:  -  (!) 28  -  (!) 38   Pulse:  -  90  -  85   BP:  -  (!) 158/60  -  (!) 159/79   Temp:  -  99 F (37.2 C)  -  98.6 F (37 C)   O2 Device:  -  Room Air  -  Room Air   Level of Consciousness:  -  -  Alert  -      PRN hydralazine parameters not met. Will give daily BP meds. MD made aware of MEWS score of 2 and increase respiration rate. Patient assessed, v/s validated. No signs of respiratory distress or c/o SOB. Will follow mews guidelines.      Venicia Vandall C Holdson 01/15/2019,8:23 AM

## 2019-01-15 NOTE — Progress Notes (Addendum)
PROGRESS NOTE    Francisco Williamson  [id number] DOB: [birth year] DOA: 01/11/2019 PCP: Default, Provider, MD   Brief Narrative:  73 y.o.BM PMHx Essential HTN and CKD stage V (s/p renal transplantation), deconditioning,  Presenting with AMS. He was admitted to [facility] from 10/28-11/3 for COVID-19-associated PNA with acute respiratory failure with hypoxia. He completed a course of Remdesivir and steroids, and he was discharged home on 11/3, patient was brought back to ED, given failure to thrive, dehydration and altered mental status, he was noted to have significant dehydration with hypernatremia and renal failure, he was transferred to [facility] for further management.    Subjective: 11/12 A/O x4, negative S OB, negative CP, negative abdominal pain.  Sitting in bed eating his meal.   Assessment & Plan:   Principal Problem:   Acute renal failure superimposed on chronic kidney disease ([place]) Active Problems:   CKD stage 5 secondary to hypertension ([place])   Pneumonia due to COVID-19 virus   Essential hypertension   History of renal transplant   Severe sepsis ([place])   Sepsis secondary to UTI ([place])   HCAP (healthcare-associated pneumonia)   Sepsis due to staph epidermidis UTI -Afebrile, negative leukocytosis.  Will receive 2 doses of vancomycin but given his renal function is = 7 doses. -Hold all nephrotoxic medication -Bactrim prophylaxis for his renal transplant -CellCept 500 mg  BID for renal transplant  -Tacrolimus 4 mg  BID renal transplant -11/11 Tacrolimus level pending -11/11 CellCept level pending  Acute on CKD stage V/RENAL transplant patient (Cr = 3.5) Recent Labs  Lab 01/12/19 0820 01/13/19 0334 01/13/19 1540 01/14/19 0100 01/15/19 0000  CREATININE 5.81* 5.12* 5.14* 4.84* 4.83*  -See sepsis UTI -0.45% saline + KCl 40 mEq 49ml/hr  HCAP/Covid 19 pneumonia  -On previous admission patient completed treatment of remdesivir+ steroids -Not fully convinced patient  has HCAP, however patient has elevated procalcitonin level Procalcitonin 11/8 (42.95), 11/11 (26.99), complete 7-day course cefepime COVID-19 Labs  Recent Labs    01/13/19 0334 01/14/19 0100 01/15/19 0000  DDIMER 1.98* 1.19* 1.13*  FERRITIN >7,500* >7,500* 4,995*  CRP 20.7* 11.9* 10.2*    Lab Results  Component Value Date   SARSCOV2NAA POSITIVE (A) 12/31/2018    Bilateral pleural effusion/Hypoalbuminemia -Albumin 50 g + Lasix IV 60 mg   Essential HTN -Amlodipine 10 mg daily -Clonidine 0.2 mg BID -BP acceptable especially given patient's GI bleed.  GI bleed Recent Labs  Lab 01/14/19 1405 01/14/19 1807 01/15/19 0000 01/15/19 0755 01/15/19 1310  HGB 8.1* 8.7* 7.4* 7.7* 7.6*  -Per EMR overnight 1910: Patient had small amount of blood with his stool, will hold his heparin subcu, will repeat CBC, will get type and screen , and start protonix 40 mg IV BID. -Transfuse for hemoglobin<7   Hypernatremia -See renal failure  Hypokalemia -Potassium goal>4 -See renal failure  -11/12 K-Dur 50 mEq -Recheck K/Mg  Hypomagnesmia -Magnesium goal> 2  Hyperglycemia -Sensitive SSI   Failure to thrive -Patient unable to care for himself at home.  Discharged on 11/3 and readmitted on 11/8.  Patient now with worsening acute on CKD stage V.  Clear that if patient goes home instead of to a SNF is at high risk for complete renal failure of his transplanted kidney.   DVT prophylaxis: SCD Code Status: DNR Family Communication: 11/12 spoke with Lyanne Co (niece) counseled her on plan of care answered all questions Disposition Plan: SNF?   Consultants:    Procedures/Significant Events:  11/8 PCXR; bilateral patchy areas of consolidation  may represent multifocal pneumonia vs atypical/viral infectious process. 11/12 PCXR-worsening multifocal airspace opacities.  -New small to moderate-sized bilateral pleural effusions.     I have personally reviewed and interpreted all  radiology studies and my findings are as above.  VENTILATOR SETTINGS:    Cultures   Antimicrobials: Anti-infectives (From admission, onward)   Start     Stop   01/16/19 1800  vancomycin (VANCOCIN) 500 mg in sodium chloride 0.9 % 100 mL IVPB         01/13/19 1900  vancomycin (VANCOCIN) IVPB 1000 mg/200 mL premix     01/13/19 2200   01/12/19 1800  ceFEPIme (MAXIPIME) 1 g in sodium chloride 0.9 % 100 mL IVPB         01/12/19 1600  sulfamethoxazole-trimethoprim (BACTRIM) 400-80 MG per tablet 1 tablet    Note to Pharmacy: Take one tablet by mouth on Monday, Wednesday and fridays.         01/11/19 0830  vancomycin (VANCOCIN) IVPB 1000 mg/200 mL premix  Status:  Discontinued     01/11/19 0901   01/11/19 0830  ceFEPIme (MAXIPIME) 2 g in sodium chloride 0.9 % 100 mL IVPB     01/11/19 0952       Devices    LINES / TUBES:      Continuous Infusions: . sodium chloride    . sodium chloride 50 mL/hr at 01/13/19 1909  . ceFEPime (MAXIPIME) IV Stopped (01/14/19 1903)  . sodium chloride 0.45 % with kcl 50 mL/hr at 01/15/19 0022  . [START ON 01/16/2019] vancomycin       Objective: Vitals:   01/14/19 2348 01/15/19 0400 01/15/19 0743 01/15/19 0821  BP: (!) 147/56 (!) 151/57 (!) 158/60 (!) 159/79  Pulse: 86 86 90 85  Resp: (!) 24 (!) 25 (!) 28 (!) 38  Temp: 97.8 F (36.6 C) 98.6 F (37 C) 99 F (37.2 C) 98.6 F (37 C)  TempSrc: Oral Oral Oral Oral  SpO2: 98% 98% 94% 99%  Weight:      Height:        Intake/Output Summary (Last 24 hours) at 01/15/2019 0845 Last data filed at 01/15/2019 0458 Gross per 24 hour  Intake 2114.13 ml  Output 1250 ml  Net 864.13 ml   Filed Weights   01/12/19 0600  Weight: 43.7 kg   Physical Exam:  General: A/O x4, no acute respiratory distress Eyes: negative scleral hemorrhage, negative anisocoria, negative icterus ENT: Negative Runny nose, negative gingival bleeding, Neck:  Negative scars, masses, torticollis, lymphadenopathy, JVD  Lungs: Tachypneic, clear to auscultation bilaterally without wheezes or crackles Cardiovascular: Regular rate and rhythm without murmur gallop or rub normal S1 and S2 Abdomen: negative abdominal pain, nondistended, positive soft, bowel sounds, no rebound, no ascites, no appreciable mass Extremities: No significant cyanosis, clubbing, or edema bilateral lower extremities Skin: Negative rashes, lesions, ulcers Psychiatric:  Negative depression, negative anxiety, negative fatigue, negative mania  Central nervous system:  Cranial nerves II through XII intact, tongue/uvula midline, all extremities muscle strength 5/5, sensation intact throughout, negative dysarthria, negative expressive aphasia, negative receptive aphasia.  .     Data Reviewed: Care during the described time interval was provided by me .  I have reviewed this patient's available data, including medical history, events of note, physical examination, and all test results as part of my evaluation.   CBC: Recent Labs  Lab 01/11/19 0800  01/12/19 0820 01/13/19 0334 01/13/19 2025 01/14/19 0100 01/14/19 1405 01/14/19 1807 01/15/19 0000 01/15/19 [id number]  WBC 12.9*  --  10.2 8.0 8.6 8.9  --   --  14.5*  --   NEUTROABS 9.9*  --  9.0* 6.9  --  7.7  --   --  13.4*  --   HGB 11.0*   < > 9.8* 8.4* 7.6* 7.2* 8.1* 8.7* 7.4* 7.7*  HCT 34.2*   < > 29.9* 25.8* 23.0* 22.1* 25.1* 26.8* 21.8* 22.6*  MCV 90.5  --  88.5 87.8 87.1 87.4  --   --  85.5  --   PLT 192  --  152 127* 115* 120*  --   --  123*  --    < > = values in this interval not displayed.   Basic Metabolic Panel: Recent Labs  Lab 01/12/19 0820 01/13/19 0334 01/13/19 1540 01/14/19 0100 01/15/19 0000  NA 157* 147* 141 137 138  K 3.9 3.2* 3.4* 3.6 3.3*  CL 119* 108 104 104 105  CO2 26 28 25 22  21*  GLUCOSE 237* 234* 378* 389* 148*  BUN 81* 74* 77* 77* 86*  CREATININE 5.81* 5.12* 5.14* 4.84* 4.83*  CALCIUM 7.8* 7.4* 7.4* 7.4* 7.4*  MG 1.8 1.5*  --  2.2 2.0  PHOS 3.8 4.1   --  3.7 2.1*   GFR: Estimated Creatinine Clearance: 8.7 mL/min (A) (by C-G formula based on SCr of 4.83 mg/dL (H)). Liver Function Tests: Recent Labs  Lab 01/11/19 0800 01/12/19 0820 01/13/19 0334 01/14/19 0100 01/15/19 0000  AST 30 34 28 38 60*  ALT 22 16 17 20  38  ALKPHOS 49 46 44 47 66  BILITOT 3.6* 2.1* 1.5* 0.8 0.6  PROT 7.8 7.4 6.7 6.0* 6.4*  ALBUMIN 2.1* 2.0* 1.7* 1.6* 1.6*   No results for input(s): LIPASE, AMYLASE in the last 168 hours. No results for input(s): AMMONIA in the last 168 hours. Coagulation Profile: Recent Labs  Lab 01/11/19 0800  INR 1.5*   Cardiac Enzymes: No results for input(s): CKTOTAL, CKMB, CKMBINDEX, TROPONINI in the last 168 hours. BNP (last 3 results) No results for input(s): PROBNP in the last 8760 hours. HbA1C: No results for input(s): HGBA1C in the last 72 hours. CBG: Recent Labs  Lab 01/14/19 1156 01/14/19 1628 01/14/19 2018 01/14/19 2343 01/15/19 0358  GLUCAP 295* 345* 304* 152* 149*   Lipid Profile: No results for input(s): CHOL, HDL, LDLCALC, TRIG, CHOLHDL, LDLDIRECT in the last 72 hours. Thyroid Function Tests: No results for input(s): TSH, T4TOTAL, FREET4, T3FREE, THYROIDAB in the last 72 hours. Anemia Panel: Recent Labs    01/14/19 0100 01/15/19 0000  FERRITIN >7,500* 4,995*   Urine analysis:    Component Value Date/Time   COLORURINE AMBER (A) 01/11/2019 1455   APPEARANCEUR CLOUDY (A) 01/11/2019 1455   LABSPEC 1.013 01/11/2019 1455   PHURINE 5.0 01/11/2019 1455   GLUCOSEU 50 (A) 01/11/2019 1455   HGBUR MODERATE (A) 01/11/2019 1455   BILIRUBINUR NEGATIVE 01/11/2019 1455   KETONESUR NEGATIVE 01/11/2019 1455   PROTEINUR 100 (A) 01/11/2019 1455   UROBILINOGEN 1.0 12/20/2014 1252   NITRITE NEGATIVE 01/11/2019 1455   LEUKOCYTESUR NEGATIVE 01/11/2019 1455   Sepsis Labs: @LABRCNTIP (procalcitonin:4,lacticidven:4)  ) Recent Results (from the past 240 hour(s))  Blood Culture (routine x 2)     Status: None  (Preliminary result)   Collection Time: 01/11/19  8:00 AM   Specimen: BLOOD LEFT FOREARM  Result Value Ref Range Status   Specimen Description BLOOD LEFT FOREARM  Final   Special Requests   Final    BOTTLES DRAWN  AEROBIC AND ANAEROBIC Blood Culture adequate volume   Culture   Final    NO GROWTH 4 DAYS Performed at [facility] [address], [place], [place] [address]    Report Status PENDING  Incomplete  Blood Culture (routine x 2)     Status: None (Preliminary result)   Collection Time: 01/11/19  8:15 AM   Specimen: BLOOD RIGHT HAND  Result Value Ref Range Status   Specimen Description BLOOD RIGHT HAND  Final   Special Requests   Final    BOTTLES DRAWN AEROBIC ONLY Blood Culture adequate volume   Culture   Final    NO GROWTH 4 DAYS Performed at [facility], [place] [address], [place], [place] [address]    Report Status PENDING  Incomplete  Culture, Urine     Status: Abnormal   Collection Time: 01/11/19  2:55 PM   Specimen: Urine, Clean Catch  Result Value Ref Range Status   Specimen Description URINE, CLEAN CATCH  Final   Special Requests   Final    NONE Performed at [facility], [place] [address] [place], [place] [address]    Culture 40,000 COLONIES/mL STAPHYLOCOCCUS EPIDERMIDIS (A)  Final   Report Status 01/13/2019 FINAL  Final   Organism ID, Bacteria STAPHYLOCOCCUS EPIDERMIDIS (A)  Final      Susceptibility   Staphylococcus epidermidis - MIC*    CIPROFLOXACIN >=8 RESISTANT Resistant     GENTAMICIN <=0.5 SENSITIVE Sensitive     NITROFURANTOIN <=16 SENSITIVE Sensitive     OXACILLIN >=4 RESISTANT Resistant     TETRACYCLINE 2 SENSITIVE Sensitive     VANCOMYCIN 1 SENSITIVE Sensitive     TRIMETH/SULFA 160 RESISTANT Resistant     CLINDAMYCIN >=8 RESISTANT Resistant     RIFAMPIN <=0.5 SENSITIVE Sensitive     Inducible Clindamycin NEGATIVE Sensitive     * 40,000 COLONIES/mL STAPHYLOCOCCUS EPIDERMIDIS         Radiology Studies: No results found.       Scheduled Meds: . amLODipine  10 mg Oral Daily  . aspirin EC  81 mg Oral Daily  . cloNIDine  0.2 mg Oral BID  . docusate sodium  100 mg Oral BID  . feeding supplement (ENSURE ENLIVE)  237 mL Oral TID BM  . hydrocortisone sod succinate (SOLU-CORTEF) inj  50 mg Intravenous Q6H  . insulin aspart  0-9 Units Subcutaneous Q4H  . Ipratropium-Albuterol  1 puff Inhalation Q6H  . mouth rinse  15 mL Mouth Rinse BID  . metoprolol tartrate  12.5 mg Oral BID  . mycophenolate  500 mg Oral BID  . pantoprazole (PROTONIX) IV  40 mg Intravenous Q12H  . sodium chloride flush  3 mL Intravenous Q12H  . sulfamethoxazole-trimethoprim  1 tablet Oral Q M,W,F  . tacrolimus  4 mg Oral BID   Continuous Infusions: . sodium chloride    . sodium chloride 50 mL/hr at 01/13/19 1909  . ceFEPime (MAXIPIME) IV Stopped (01/14/19 1903)  . sodium chloride 0.45 % with kcl 50 mL/hr at 01/15/19 0022  . [START ON 01/16/2019] vancomycin       LOS: 4 days   The patient is critically ill with multiple organ systems failure and requires high complexity decision making for assessment and support, frequent evaluation and titration of therapies, application of advanced monitoring technologies and extensive interpretation of multiple databases. Critical Care Time devoted to patient care services described in this note  Time spent: 40 minutes  [name], MD [facility] Pager [phone]  If 7PM-7AM, please contact night-coverage [email or web address] Password [email or web address] 01/15/2019, 8:45 AM

## 2019-01-16 ENCOUNTER — Inpatient Hospital Stay (HOSPITAL_COMMUNITY): Payer: Medicare Other

## 2019-01-16 DIAGNOSIS — J90 Pleural effusion, not elsewhere classified: Secondary | ICD-10-CM | POA: Diagnosis present

## 2019-01-16 LAB — CBC WITH DIFFERENTIAL/PLATELET
Abs Immature Granulocytes: 0.24 10*3/uL — ABNORMAL HIGH (ref 0.00–0.07)
Basophils Absolute: 0 10*3/uL (ref 0.0–0.1)
Basophils Relative: 0 %
Eosinophils Absolute: 0 10*3/uL (ref 0.0–0.5)
Eosinophils Relative: 0 %
HCT: 21.5 % — ABNORMAL LOW (ref 39.0–52.0)
Hemoglobin: 7.1 g/dL — ABNORMAL LOW (ref 13.0–17.0)
Immature Granulocytes: 2 %
Lymphocytes Relative: 5 %
Lymphs Abs: 0.7 10*3/uL (ref 0.7–4.0)
MCH: 29 pg (ref 26.0–34.0)
MCHC: 33 g/dL (ref 30.0–36.0)
MCV: 87.8 fL (ref 80.0–100.0)
Monocytes Absolute: 0.3 10*3/uL (ref 0.1–1.0)
Monocytes Relative: 2 %
Neutro Abs: 13.5 10*3/uL — ABNORMAL HIGH (ref 1.7–7.7)
Neutrophils Relative %: 91 %
Platelets: 132 10*3/uL — ABNORMAL LOW (ref 150–400)
RBC: 2.45 MIL/uL — ABNORMAL LOW (ref 4.22–5.81)
RDW: 17.5 % — ABNORMAL HIGH (ref 11.5–15.5)
WBC: 14.8 10*3/uL — ABNORMAL HIGH (ref 4.0–10.5)
nRBC: 0 % (ref 0.0–0.2)

## 2019-01-16 LAB — CULTURE, BLOOD (ROUTINE X 2)
Culture: NO GROWTH
Culture: NO GROWTH
Special Requests: ADEQUATE
Special Requests: ADEQUATE

## 2019-01-16 LAB — COMPREHENSIVE METABOLIC PANEL
ALT: 37 U/L (ref 0–44)
AST: 53 U/L — ABNORMAL HIGH (ref 15–41)
Albumin: 2.5 g/dL — ABNORMAL LOW (ref 3.5–5.0)
Alkaline Phosphatase: 65 U/L (ref 38–126)
Anion gap: 13 (ref 5–15)
BUN: 93 mg/dL — ABNORMAL HIGH (ref 8–23)
CO2: 19 mmol/L — ABNORMAL LOW (ref 22–32)
Calcium: 8 mg/dL — ABNORMAL LOW (ref 8.9–10.3)
Chloride: 112 mmol/L — ABNORMAL HIGH (ref 98–111)
Creatinine, Ser: 4.89 mg/dL — ABNORMAL HIGH (ref 0.61–1.24)
GFR calc Af Amer: 13 mL/min — ABNORMAL LOW (ref >60)
GFR calc non Af Amer: 11 mL/min — ABNORMAL LOW (ref >60)
Glucose, Bld: 147 mg/dL — ABNORMAL HIGH (ref 70–99)
Potassium: 4.2 mmol/L (ref 3.5–5.1)
Sodium: 144 mmol/L (ref 135–145)
Total Bilirubin: 1.2 mg/dL (ref 0.3–1.2)
Total Protein: 7 g/dL (ref 6.5–8.1)

## 2019-01-16 LAB — POCT I-STAT 7, (LYTES, BLD GAS, ICA,H+H)
Acid-base deficit: 3 mmol/L — ABNORMAL HIGH (ref 0.0–2.0)
Bicarbonate: 19.3 mmol/L — ABNORMAL LOW (ref 20.0–28.0)
Calcium, Ion: 1.16 mmol/L (ref 1.15–1.40)
HCT: 26 % — ABNORMAL LOW (ref 39.0–52.0)
Hemoglobin: 8.8 g/dL — ABNORMAL LOW (ref 13.0–17.0)
O2 Saturation: 82 %
Patient temperature: 98
Potassium: 3.4 mmol/L — ABNORMAL LOW (ref 3.5–5.1)
Sodium: 147 mmol/L — ABNORMAL HIGH (ref 135–145)
TCO2: 20 mmol/L — ABNORMAL LOW (ref 22–32)
pCO2 arterial: 22.7 mmHg — ABNORMAL LOW (ref 32.0–48.0)
pH, Arterial: 7.536 — ABNORMAL HIGH (ref 7.350–7.450)
pO2, Arterial: 38 mmHg — CL (ref 83.0–108.0)

## 2019-01-16 LAB — GLUCOSE, CAPILLARY
Glucose-Capillary: 137 mg/dL — ABNORMAL HIGH (ref 70–99)
Glucose-Capillary: 114 mg/dL — ABNORMAL HIGH (ref 70–99)
Glucose-Capillary: 157 mg/dL — ABNORMAL HIGH (ref 70–99)
Glucose-Capillary: 92 mg/dL (ref 70–99)
Glucose-Capillary: 177 mg/dL — ABNORMAL HIGH (ref 70–99)

## 2019-01-16 LAB — PROCALCITONIN: Procalcitonin: 9.6 ng/mL

## 2019-01-16 LAB — LACTIC ACID, PLASMA
Lactic Acid, Venous: 1.5 mmol/L (ref 0.5–1.9)
Lactic Acid, Venous: 1.1 mmol/L (ref 0.5–1.9)

## 2019-01-16 LAB — TROPONIN I (HIGH SENSITIVITY)
Troponin I (High Sensitivity): 41 ng/L — ABNORMAL HIGH (ref <18)
Troponin I (High Sensitivity): 41 ng/L — ABNORMAL HIGH (ref <18)

## 2019-01-16 LAB — D-DIMER, QUANTITATIVE: D-Dimer, Quant: 1.86 ug/mL-FEU — ABNORMAL HIGH (ref 0.00–0.50)

## 2019-01-16 LAB — FERRITIN: Ferritin: 4335 ng/mL — ABNORMAL HIGH (ref 24–336)

## 2019-01-16 LAB — PHOSPHORUS: Phosphorus: 3.9 mg/dL (ref 2.5–4.6)

## 2019-01-16 LAB — PREPARE RBC (CROSSMATCH)

## 2019-01-16 LAB — TACROLIMUS LEVEL: Tacrolimus (FK506) - LabCorp: 8.5 ng/mL (ref 2.0–20.0)

## 2019-01-16 LAB — MAGNESIUM: Magnesium: 1.9 mg/dL (ref 1.7–2.4)

## 2019-01-16 LAB — C-REACTIVE PROTEIN: CRP: 14.4 mg/dL — ABNORMAL HIGH (ref <1.0)

## 2019-01-16 MED ORDER — METOPROLOL TARTRATE 50 MG PO TABS
50.0000 mg | ORAL_TABLET | Freq: Two times a day (BID) | ORAL | Status: DC
  Administered 2019-01-16 – 2019-02-06 (×40): 50 mg via ORAL
  Filled 2019-01-16 (×3): qty 1
  Filled 2019-01-16: qty 4
  Filled 2019-01-16 (×4): qty 1
  Filled 2019-01-16: qty 4
  Filled 2019-01-16 (×11): qty 1
  Filled 2019-01-16: qty 4
  Filled 2019-01-16 (×10): qty 1
  Filled 2019-01-16: qty 4
  Filled 2019-01-16 (×2): qty 1
  Filled 2019-01-16: qty 4
  Filled 2019-01-16 (×2): qty 1
  Filled 2019-01-16: qty 4
  Filled 2019-01-16 (×4): qty 1

## 2019-01-16 MED ORDER — ALBUMIN HUMAN 25 % IV SOLN
50.0000 g | Freq: Once | INTRAVENOUS | Status: AC
  Administered 2019-01-16: 50 g via INTRAVENOUS
  Filled 2019-01-16: qty 50

## 2019-01-16 MED ORDER — LABETALOL HCL 5 MG/ML IV SOLN
5.0000 mg | INTRAVENOUS | Status: DC | PRN
  Administered 2019-01-16: 5 mg via INTRAVENOUS
  Filled 2019-01-16: qty 4

## 2019-01-16 MED ORDER — LABETALOL HCL 5 MG/ML IV SOLN
INTRAVENOUS | Status: AC
  Filled 2019-01-16: qty 4

## 2019-01-16 MED ORDER — SODIUM CHLORIDE 0.9% IV SOLUTION
Freq: Once | INTRAVENOUS | Status: AC
  Administered 2019-01-16: 15:00:00 via INTRAVENOUS

## 2019-01-16 MED ORDER — FUROSEMIDE 10 MG/ML IJ SOLN
120.0000 mg | Freq: Three times a day (TID) | INTRAVENOUS | Status: DC
  Administered 2019-01-16 (×2): 120 mg via INTRAVENOUS
  Filled 2019-01-16 (×4): qty 12

## 2019-01-16 NOTE — Progress Notes (Signed)
PROGRESS NOTE    Francisco Williamson  [id number] DOB: 03/21/1945 DOA: 01/11/2019 PCP: Default, Provider, MD   Brief Narrative:  73 y.o.BM PMHx Essential HTN and CKD stage V (s/p renal transplantation), deconditioning,  Presenting with AMS. He was admitted to [facility] from 10/28-11/3 for COVID-19-associated PNA with acute respiratory failure with hypoxia. He completed a course of Remdesivir and steroids, and he was discharged home on 11/3, patient was brought back to ED, given failure to thrive, dehydration and altered mental status, he was noted to have significant dehydration with hypernatremia and renal failure, he was transferred to [facility] for further management.    Subjective: 11/13 T-max last 24 hours 38.1 C negative CP, negative abdominal pain.  States decreased WOB/SOB overnight.    Assessment & Plan:   Principal Problem:   Acute renal failure superimposed on chronic kidney disease ([place]) Active Problems:   CKD stage 5 secondary to hypertension ([place])   Pneumonia due to COVID-19 virus   Essential hypertension   History of renal transplant   Severe sepsis (HCC)   Sepsis secondary to UTI ([place])   HCAP (healthcare-associated pneumonia)   Bilateral pleural effusion   Sepsis due to staph epidermidis UTI -Afebrile, negative leukocytosis.  Will receive 2 doses of vancomycin but given his renal function is = 7 doses. -Hold all nephrotoxic medication -Bactrim prophylaxis for his renal transplant -CellCept 500 mg  BID for renal transplant  -Tacrolimus 4 mg  BID renal transplant -11/11 Tacrolimus level = 8.5 -11/11 CellCept level pending  Acute on CKD stage V/RENAL transplant patient (Cr = 3.5) Recent Labs  Lab 01/13/19 0334 01/13/19 1540 01/14/19 0100 01/15/19 0000 01/16/19 0020  CREATININE 5.12* 5.14* 4.84* 4.83* 4.89*  -See sepsis UTI -Strict in and out +7.2 L -Daily weight Filed Weights   01/12/19 0600  Weight: 43.7 kg  -11/12 Albumin 50 g + Lasix IV 60 mg  -11/13 Albumin 50 g, followed by Lasix IV 120 mg TID (discussed case with nephrology Dr. Jonnie Finner); will see patient today.  I believe patient may require CRRT will await nephrology recommendations  HCAP/Covid 19 pneumonia  -On previous admission patient completed treatment of remdesivir+ steroids -Not fully convinced patient has HCAP, however patient has elevated procalcitonin level Procalcitonin 11/8 (42.95), 11/11 (26.99), complete 7-day course cefepime COVID-19 Labs  Recent Labs    01/14/19 0100 01/15/19 0000 01/16/19 0020  DDIMER 1.19* 1.13* 1.86*  FERRITIN >7,500* 4,995* 4,335*  CRP 11.9* 10.2* 14.4*    Lab Results  Component Value Date   SARSCOV2NAA POSITIVE (A) 12/31/2018    Bilateral pleural effusion/Hypoalbuminemia -See acute on CKD stage V  Essential HTN -Amlodipine 10 mg daily -Clonidine 0.2 mg BID -11/13 increase Metoprolol 50 mg BID  GI bleed Recent Labs  Lab 01/15/19 0000 01/15/19 0755 01/15/19 1310 01/16/19 0020 01/16/19 0535  HGB 7.4* 7.7* 7.6* 7.1* 8.8*  -Per EMR overnight 1910: Patient had small amount of blood with his stool, will hold his heparin subcu, will repeat CBC, will get type and screen , and start protonix 40 mg IV BID. -11/13 occult blood pending -11/13 transfuse 1 unit PRBC -Transfuse for hemoglobin<7   Hypernatremia -See renal failure  Hypokalemia -Potassium goal>4 -See renal failure   Hypomagnesmia -Magnesium goal> 2  Hyperglycemia -Sensitive SSI   Failure to thrive -Patient unable to care for himself at home.  Discharged on 11/3 and readmitted on 11/8.  Patient now with worsening acute on CKD stage V.  Clear that if patient goes home instead of to  a SNF is at high risk for complete renal failure of his transplanted kidney.   DVT prophylaxis: SCD Code Status: DNR Family Communication: 11/12 spoke with Lyanne Co (niece) counseled her on plan of care answered all questions Disposition Plan: SNF?   Consultants:     Procedures/Significant Events:  11/8 PCXR; bilateral patchy areas of consolidation may represent multifocal pneumonia vs atypical/viral infectious process. 11/12 PCXR-worsening multifocal airspace opacities.  -New small to moderate-sized bilateral pleural effusions. 11/13 transfuse 1 unit PRBC    I have personally reviewed and interpreted all radiology studies and my findings are as above.  VENTILATOR SETTINGS:    Cultures 11/13 blood pending 11/13 urine pending     Antimicrobials: Anti-infectives (From admission, onward)   Start     Stop   01/16/19 1800  vancomycin (VANCOCIN) 500 mg in sodium chloride 0.9 % 100 mL IVPB         01/13/19 1900  vancomycin (VANCOCIN) IVPB 1000 mg/200 mL premix     01/13/19 2200   01/12/19 1800  ceFEPIme (MAXIPIME) 1 g in sodium chloride 0.9 % 100 mL IVPB         01/12/19 1600  sulfamethoxazole-trimethoprim (BACTRIM) 400-80 MG per tablet 1 tablet    Note to Pharmacy: Take one tablet by mouth on Monday, Wednesday and fridays.         01/11/19 0830  vancomycin (VANCOCIN) IVPB 1000 mg/200 mL premix  Status:  Discontinued     01/11/19 0901   01/11/19 0830  ceFEPIme (MAXIPIME) 2 g in sodium chloride 0.9 % 100 mL IVPB     01/11/19 0952       Devices    LINES / TUBES:      Continuous Infusions: . sodium chloride    . ceFEPime (MAXIPIME) IV 1 g (01/15/19 1731)  . furosemide    . sodium chloride 0.45 % with kcl 50 mL/hr at 01/15/19 1700  . vancomycin       Objective: Vitals:   01/16/19 0520 01/16/19 0534 01/16/19 0546 01/16/19 0730  BP: (!) 187/65 (!) 171/61 (!) 157/114 (!) 148/57  Pulse: (!) 113 (!) 102 99 85  Resp: (!) 36 (!) 44 (!) 29 (!) 40  Temp:  98 F (36.7 C)  98.7 F (37.1 C)  TempSrc:  Oral  Oral  SpO2: 91% 91% 99% 100%  Weight:      Height:        Intake/Output Summary (Last 24 hours) at 01/16/2019 1439 Last data filed at 01/15/2019 [id number] Gross per 24 hour  Intake 600.21 ml  Output 600 ml  Net 0.21 ml    Filed Weights   01/12/19 0600  Weight: 43.7 kg   Physical Exam:  General: A/O x4, positive acute respiratory distress (improved from 11/12) Eyes: negative scleral hemorrhage, negative anisocoria, negative icterus ENT: Negative Runny nose, negative gingival bleeding, Neck:  Negative scars, masses, torticollis, lymphadenopathy, JVD Lungs: Tachypneic, decreased RLL breath sounds with crackles, without wheezes  Cardiovascular:, Tachycardic without murmur gallop or rub normal S1 and S2 Abdomen: negative abdominal pain, nondistended, positive soft, bowel sounds, no rebound, no ascites, no appreciable mass Extremities: No significant cyanosis, clubbing, or edema bilateral lower extremities Skin: Negative rashes, lesions, ulcers Psychiatric:  Negative depression, negative anxiety, negative fatigue, negative mania  Central nervous system:  Cranial nerves II through XII intact, tongue/uvula midline, all extremities muscle strength 5/5, sensation intact throughout,  negative dysarthria, negative expressive aphasia, negative receptive aphasia.  .     Data Reviewed: Care  during the described time interval was provided by me .  I have reviewed this patient's available data, including medical history, events of note, physical examination, and all test results as part of my evaluation.   CBC: Recent Labs  Lab 01/12/19 0820 01/13/19 0334 01/13/19 2025 01/14/19 0100  01/15/19 0000 01/15/19 0755 01/15/19 1310 01/16/19 0020 01/16/19 0535  WBC 10.2 8.0 8.6 8.9  --  14.5*  --   --  14.8*  --   NEUTROABS 9.0* 6.9  --  7.7  --  13.4*  --   --  13.5*  --   HGB 9.8* 8.4* 7.6* 7.2*   < > 7.4* 7.7* 7.6* 7.1* 8.8*  HCT 29.9* 25.8* 23.0* 22.1*   < > 21.8* 22.6* 23.1* 21.5* 26.0*  MCV 88.5 87.8 87.1 87.4  --  85.5  --   --  87.8  --   PLT 152 127* 115* 120*  --  123*  --   --  132*  --    < > = values in this interval not displayed.   Basic Metabolic Panel: Recent Labs  Lab 01/12/19 0820 01/13/19  0334 01/13/19 1540 01/14/19 0100 01/15/19 0000 01/15/19 1540 01/16/19 0020 01/16/19 0535  NA 157* 147* 141 137 138  --  144 147*  K 3.9 3.2* 3.4* 3.6 3.3* 4.4 4.2 3.4*  CL 119* 108 104 104 105  --  112*  --   CO2 26 28 25 22  21*  --  19*  --   GLUCOSE 237* 234* 378* 389* 148*  --  147*  --   BUN 81* 74* 77* 77* 86*  --  93*  --   CREATININE 5.81* 5.12* 5.14* 4.84* 4.83*  --  4.89*  --   CALCIUM 7.8* 7.4* 7.4* 7.4* 7.4*  --  8.0*  --   MG 1.8 1.5*  --  2.2 2.0 1.9 1.9  --   PHOS 3.8 4.1  --  3.7 2.1*  --  3.9  --    GFR: Estimated Creatinine Clearance: 8.6 mL/min (A) (by C-G formula based on SCr of 4.89 mg/dL (H)). Liver Function Tests: Recent Labs  Lab 01/12/19 0820 01/13/19 0334 01/14/19 0100 01/15/19 0000 01/16/19 0020  AST 34 28 38 60* 53*  ALT 16 17 20  38 37  ALKPHOS 46 44 47 66 65  BILITOT 2.1* 1.5* 0.8 0.6 1.2  PROT 7.4 6.7 6.0* 6.4* 7.0  ALBUMIN 2.0* 1.7* 1.6* 1.6* 2.5*   No results for input(s): LIPASE, AMYLASE in the last 168 hours. No results for input(s): AMMONIA in the last 168 hours. Coagulation Profile: Recent Labs  Lab 01/11/19 0800  INR 1.5*   Cardiac Enzymes: No results for input(s): CKTOTAL, CKMB, CKMBINDEX, TROPONINI in the last 168 hours. BNP (last 3 results) No results for input(s): PROBNP in the last 8760 hours. HbA1C: No results for input(s): HGBA1C in the last 72 hours. CBG: Recent Labs  Lab 01/15/19 1651 01/15/19 2153 01/16/19 0048 01/16/19 0410 01/16/19 0740  GLUCAP 92 148* 157* 114* 137*   Lipid Profile: No results for input(s): CHOL, HDL, LDLCALC, TRIG, CHOLHDL, LDLDIRECT in the last 72 hours. Thyroid Function Tests: No results for input(s): TSH, T4TOTAL, FREET4, T3FREE, THYROIDAB in the last 72 hours. Anemia Panel: Recent Labs    01/15/19 0000 01/16/19 0020  FERRITIN 4,995* 4,335*   Urine analysis:    Component Value Date/Time   COLORURINE AMBER (A) 01/11/2019 1455   APPEARANCEUR CLOUDY (A) 01/11/2019 1455  LABSPEC 1.013 01/11/2019 1455   PHURINE 5.0 01/11/2019 1455   GLUCOSEU 50 (A) 01/11/2019 1455   HGBUR MODERATE (A) 01/11/2019 1455   BILIRUBINUR NEGATIVE 01/11/2019 1455   KETONESUR NEGATIVE 01/11/2019 1455   PROTEINUR 100 (A) 01/11/2019 1455   UROBILINOGEN 1.0 12/20/2014 1252   NITRITE NEGATIVE 01/11/2019 1455   LEUKOCYTESUR NEGATIVE 01/11/2019 1455   Sepsis Labs: @LABRCNTIP (procalcitonin:4,lacticidven:4)  ) Recent Results (from the past 240 hour(s))  Blood Culture (routine x 2)     Status: None   Collection Time: 01/11/19  8:00 AM   Specimen: BLOOD LEFT FOREARM  Result Value Ref Range Status   Specimen Description BLOOD LEFT FOREARM  Final   Special Requests   Final    BOTTLES DRAWN AEROBIC AND ANAEROBIC Blood Culture adequate volume   Culture   Final    NO GROWTH 5 DAYS Performed at [facility], [place] [address] [place], [place] [address]    Report Status 01/16/2019 FINAL  Final  Blood Culture (routine x 2)     Status: None   Collection Time: 01/11/19  8:15 AM   Specimen: BLOOD RIGHT HAND  Result Value Ref Range Status   Specimen Description BLOOD RIGHT HAND  Final   Special Requests   Final    BOTTLES DRAWN AEROBIC ONLY Blood Culture adequate volume   Culture   Final    NO GROWTH 5 DAYS Performed at [facility], [place] [address] [place], [place] [address]    Report Status 01/16/2019 FINAL  Final  Culture, Urine     Status: Abnormal   Collection Time: 01/11/19  2:55 PM   Specimen: Urine, Clean Catch  Result Value Ref Range Status   Specimen Description URINE, CLEAN CATCH  Final   Special Requests   Final    NONE Performed at [facility], [place] [address] [place], [place] [address]    Culture 40,000 COLONIES/mL STAPHYLOCOCCUS EPIDERMIDIS (A)  Final   Report Status 01/13/2019 FINAL  Final   Organism ID, Bacteria STAPHYLOCOCCUS EPIDERMIDIS (A)  Final      Susceptibility   Staphylococcus epidermidis - MIC*    CIPROFLOXACIN >=8 RESISTANT Resistant      GENTAMICIN <=0.5 SENSITIVE Sensitive     NITROFURANTOIN <=16 SENSITIVE Sensitive     OXACILLIN >=4 RESISTANT Resistant     TETRACYCLINE 2 SENSITIVE Sensitive     VANCOMYCIN 1 SENSITIVE Sensitive     TRIMETH/SULFA 160 RESISTANT Resistant     CLINDAMYCIN >=8 RESISTANT Resistant     RIFAMPIN <=0.5 SENSITIVE Sensitive     Inducible Clindamycin NEGATIVE Sensitive     * 40,000 COLONIES/mL STAPHYLOCOCCUS EPIDERMIDIS  Culture, blood (routine x 2)     Status: None (Preliminary result)   Collection Time: 01/16/19  7:45 AM   Specimen: BLOOD  Result Value Ref Range Status   Specimen Description   Final    BLOOD RIGHT ANTECUBITAL Performed at [facility], [place] [address] [place], [place] [address]    Special Requests   Final    BOTTLES DRAWN AEROBIC ONLY Blood Culture adequate volume Performed at [place] [address], [place], [place] [address]    Culture   Final    NO GROWTH <12 HOURS Performed at [facility] [address] [place], [place] [address]    Report Status PENDING  Incomplete  Culture, blood (routine x 2)     Status: None (Preliminary result)   Collection Time: 01/16/19  7:50 AM   Specimen: BLOOD  Result Value Ref Range Status   Specimen Description   Final    BLOOD RIGHT HAND Performed at [facility] [address], [place], [place] [address]    Special Requests   Final    BOTTLES DRAWN AEROBIC ONLY Blood Culture adequate volume Performed at [facility] [address], [place], [place] [address]    Culture   Final    NO GROWTH <12 HOURS Performed at [facility] [address], [place], [place] [address]    Report Status PENDING  Incomplete         Radiology Studies: Dg Chest 1 View  Result Date: 01/15/2019 CLINICAL DATA:  Shortness of breath with pneumonia. EXAM: CHEST  1 VIEW COMPARISON:  January 11, 2019 FINDINGS: There are diffuse bilateral airspace  opacities with progression since the prior study. There are small to moderate-sized bilateral pleural effusions. There is no pneumothorax. The heart size is stable from prior study. There is no acute osseous abnormality. There is no displaced rib fracture. IMPRESSION: Worsening multifocal airspace opacities. New small to moderate-sized bilateral pleural effusions. Electronically Signed   By: Constance Holster M.D.   On: 01/15/2019 15:13   Dg Chest Port 1 View  Result Date: 01/16/2019 CLINICAL DATA:  Bilateral pleural effusions. COVID positive. EXAM: PORTABLE CHEST 1 VIEW COMPARISON:  Radiograph same day 538, 01/15/2019 FINDINGS: Stable mediastinum and cardiac silhouette. There is diffuse airspace disease in the lower lobes not improved from comparison exams. Potential small bilateral effusions. No pneumothorax. No acute osseous abnormality. IMPRESSION: No interval change in bilateral lower lobe airspace disease. Electronically Signed   By: Suzy Bouchard M.D.   On: 01/16/2019 08:22   Dg Chest Port 1 View  Result Date: 01/16/2019 CLINICAL DATA:  Shortness of breath. EXAM: PORTABLE CHEST 1 VIEW COMPARISON:  Chest x-ray 01/15/2019. FINDINGS: Heart size normal. Diffuse bilateral pulmonary infiltrates again noted. Improved bibasilar atelectasis. Small left pleural effusion again noted. No right pleural effusion noted on today's exam. No pneumothorax. Degenerative change thoracic spine. IMPRESSION: Diffuse bilateral pulmonary infiltrates again noted. Similar findings noted on prior exam. Small left pleural effusion again noted. No right pleural effusion noted on today's exam. 2.  Improved atelectasis in the lung bases. Electronically Signed   By: Gonzales   On: 01/16/2019 06:01        Scheduled Meds: . sodium chloride   Intravenous Once  . amLODipine  10 mg Oral Daily  . aspirin EC  81 mg Oral Daily  . cloNIDine  0.2 mg Oral BID  . docusate sodium  100 mg Oral BID  . feeding supplement  (ENSURE ENLIVE)  237 mL Oral TID BM  . hydrocortisone sod succinate (SOLU-CORTEF) inj  50 mg Intravenous Q6H  . insulin aspart  0-9 Units Subcutaneous Q4H  . Ipratropium-Albuterol  1 puff Inhalation Q6H  . labetalol      . mouth rinse  15 mL Mouth Rinse BID  . metoprolol tartrate  50 mg Oral BID  . mycophenolate  500 mg Oral BID  . pantoprazole (PROTONIX) IV  40 mg Intravenous Q12H  . sodium chloride flush  3 mL Intravenous Q12H  . sulfamethoxazole-trimethoprim  1 tablet Oral Q M,W,F  . tacrolimus  4 mg Oral BID   Continuous Infusions: . sodium chloride    . ceFEPime (MAXIPIME) IV 1 g (01/15/19 1731)  . furosemide    . sodium chloride 0.45 % with kcl  50 mL/hr at 01/15/19 1700  . vancomycin       LOS: 5 days   The patient is critically ill with multiple organ systems failure and requires high complexity decision making for assessment and support, frequent evaluation and titration of therapies, application of advanced monitoring technologies and extensive interpretation of multiple databases. Critical Care Time devoted to patient care services described in this note  Time spent: 40 minutes     Malaky Tetrault, Geraldo Docker, MD [facility] Pager [phone]  If 7PM-7AM, please contact night-coverage [email or web address] Password [facility] 01/16/2019, 2:39 PM

## 2019-01-16 NOTE — Progress Notes (Signed)
RT NOTE:  ABG reported to Dr. Alcario Drought. Pt now on 12L [place]

## 2019-01-16 NOTE — Progress Notes (Signed)
Patient experiencing elevated B/P, increased work of breathing and respirations.PRN hydralzine administered, 2 lpm O2 placed.  Rapid response called for evaluation of patient. Warrick Parisian, RN

## 2019-01-16 NOTE — Significant Event (Addendum)
Patient seen and evaluated at bedside due to increased RR.  S: Patient denies CP, states he actually doesn't feel really SOB, he is complaining of chills.  O: T 100.5 (ice packs in place), RR 40-50, HR 100-105, sinus; BP [id number] systolic despite getting 5 of hydralazine about 1h ago.  O2 sat 90-95%, only on 2L [place]  Tachypnic with accessory muscle use. But CTA bilaterally.  A/P: SOB / tachypnea DDx includes, but not limited to: 1) Fever / SIRS causing tachypnea - favored at this point 2) CHF / fluid overload -less favored given the clear lung sounds bilaterally 3) PE -  Possible, though would have expected his blood pressure to be lower than [id number] systolic with large clot burden PE 4) worsening COVID - would expect his O2 requirement to be much worse than it is in this case.  Plan: 1) ABG 2) CXR 3) EKG - though doubt ACS given he is denying CP 4) give tylenol, ice packs, and try to get fever down 5) Stopping IVF for the moment 6) cant CTA due to creat of 4.89 (AKI on CKD in renal transplant patient). 7) D. Dimer of 1.8 slightly up from 1.1 yesterday 8) Checking trop  Addendum: May be more hypoxic than pulse ox indicating: ABG showing arterial O2 sat of 82%, and resp therapy is sure that they got the artery (pulsating on draw, etc).  Only on 2L, will try turning up the Oxygen and seeing if this helps RR.

## 2019-01-16 NOTE — Consult Note (Signed)
Renal Service Consult Note [facility]  Francisco Williamson 01/16/2019 [name] Requesting Physician:  Dr [name]  Reason for Consult:  Renal transplant pt w/ AKI/ CKD IV and COVID infection HPI: The patient is a 73 y.o. 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  He was admitted to [facility] here from 10/28- 01/06/2019  for COVID-10 assoc PNA w/ resp failure/ hypoxia. He was rx'd w/ antivirals and steroids and dc'd.  He became confused at home then on 11/8 and per EMS sats were in the mid 80's on RA, improved w/ O2. Pt was evaluated in ED.  Felt to have "ongoing multifocal PNA" and was treated as sepsis w/ cefepime and Zyvox (in place of vanc w/ AK/ CKD).  Pt was admitted to the floor at [facility].  Creat was 6.6 on admission and is down to 4.8 today.  UOP has been about 600- 1200 cc /day.  There has been no shock / hypotension. RR's are high in the 30's- 40's.  Pt was DNR on his prior admission and remains DNR here. Asked to see for renal failure.    Pt had renal transplant in 2002, initial creat was in the high 2's after a polyoma virus infection but kidney did well for many yrs.  More recently baseline creat has been in the 3's w/ occassional 4's.  Pt had been losing wt and alb was low, prednisone was ^'d to 10 mg/ d to try to help appetite at last visit in Oct 2020 w/ his renal MD Dr [name].   Pt seen in room , he looks very tired but is pleasant and answers questions. No n/v ,eating some.  No cp. +sob.   ROS  denies CP  no joint pain   no HA  no blurry vision  no rash  no diarrhea  no nausea/ vomiting   Past Medical History  Past Medical History:  Diagnosis Date  . Hypertension   . Renal disorder    Past Surgical History  Past Surgical History:  Procedure Laterality Date  . NEPHRECTOMY TRANSPLANTED ORGAN     Family History History reviewed. No pertinent family history. Social History  reports that he has never smoked. He has never used smokeless  tobacco. He reports that he does not drink alcohol or use drugs. Allergies No Known Allergies Home medications Prior to Admission medications   Medication Sig Start Date End Date Taking? Authorizing Provider  aspirin EC 81 MG tablet Take 81 mg by mouth daily.   Yes [provider]  Darbepoetin Alfa (ARANESP) 300 MCG/0.6ML SOSY injection Inject 300 mcg into the skin every 7 (seven) days.   Yes [provider]  sulfamethoxazole-trimethoprim (BACTRIM,SEPTRA) 400-80 MG per tablet Take 1 tablet by mouth every Monday, Wednesday, and Friday. Take one tablet by mouth on Monday, Wednesday and fridays. 11/21/18  Yes [provider]  acetaminophen (TYLENOL) 325 MG tablet Take 2 tablets (650 mg total) by mouth every 6 (six) hours as needed for mild pain or headache (fever >/= 101). 01/06/19   [name], MD  amLODipine (NORVASC) 10 MG tablet Take 10 mg by mouth daily. 06/17/14   [provider]  calcitRIOL (ROCALTROL) 0.5 MCG capsule Take 0.5 mcg by mouth daily. 06/17/14   [provider]  cloNIDine (CATAPRES) 0.2 MG tablet Take 1 tablet (0.2 mg total) by mouth 2 (two) times daily. 01/06/19   [name], MD  ferrous sulfate 325 (65  FE) MG EC tablet Take 325 mg by mouth 2 (two) times daily.    [provider]  Ipratropium-Albuterol (COMBIVENT) 20-100 MCG/ACT AERS respimat Inhale 1 puff into the lungs every 6 (six) hours. 01/06/19   [name], MD  labetalol (NORMODYNE) 300 MG tablet Take 1 tablet (300 mg total) by mouth 3 (three) times daily. Patient taking differently: Take 300 mg by mouth 2 (two) times daily.  01/06/19   [name], MD  magnesium oxide (MAG-OX) 400 MG tablet Take 400 mg by mouth 2 (two) times daily.    [provider]  mycophenolate (CELLCEPT) 500 MG tablet Take 500 mg by mouth 2 (two) times daily.    [provider]  ondansetron (ZOFRAN) 4 MG tablet Take 1 tablet (4 mg total) by mouth every 6 (six) hours as  needed for nausea. Patient not taking: Reported on 01/11/2019 01/06/19   [name], MD  pantoprazole (PROTONIX) 40 MG tablet Take 40 mg by mouth daily.    [provider]  PREDNISONE PO Take 5 mg by mouth 2 (two) times daily.     [provider]  sildenafil (VIAGRA) 100 MG tablet Take 100 mg by mouth as needed for erectile dysfunction.    [provider]  sodium bicarbonate 650 MG tablet Take 650 mg by mouth 3 (three) times daily.    [provider]  tacrolimus (PROGRAF) 1 MG capsule Take 1 mg by mouth 2 (two) times daily.     [provider]  traZODone (DESYREL) 50 MG tablet Take 0.5 tablets (25 mg total) by mouth at bedtime as needed for sleep. Patient not taking: Reported on 01/11/2019 01/06/19   [name], MD  vitamin C (VITAMIN C) 500 MG tablet Take 1 tablet (500 mg total) by mouth daily. 01/07/19   [name], MD  zinc sulfate 220 (50 Zn) MG capsule Take 1 capsule (220 mg total) by mouth daily. 01/07/19   [name], MD   Liver Function Tests Recent Labs  Lab 01/14/19 0100 01/15/19 0000 01/16/19 0020  AST 38 60* 53*  ALT 20 38 37  ALKPHOS 47 66 65  BILITOT 0.8 0.6 1.2  PROT 6.0* 6.4* 7.0  ALBUMIN 1.6* 1.6* 2.5*   No results for input(s): LIPASE, AMYLASE in the last 168 hours. CBC Recent Labs  Lab 01/14/19 0100  01/15/19 0000  01/15/19 1310 01/16/19 0020 01/16/19 0535  WBC 8.9  --  14.5*  --   --  14.8*  --   NEUTROABS 7.7  --  13.4*  --   --  13.5*  --   HGB 7.2*   < > 7.4*   < > 7.6* 7.1* 8.8*  HCT 22.1*   < > 21.8*   < > 23.1* 21.5* 26.0*  MCV 87.4  --  85.5  --   --  87.8  --   PLT 120*  --  123*  --   --  132*  --    < > = values in this interval not displayed.   Basic Metabolic Panel Recent Labs  Lab 01/11/19 0800  01/12/19 0820 01/13/19 0334 01/13/19 1540 01/14/19 0100 01/15/19 0000 01/15/19 1540 01/16/19 0020 01/16/19 0535  NA 153*   < > 157* 147* 141 137 138  --  144 147*  K 4.8   < > 3.9  3.2* 3.4* 3.6 3.3* 4.4 4.2 3.4*  CL 125*  --  119* 108 104 104 105  --  112*  --   CO2 16*  --  26 28 25 22  21*  --  19*  --   GLUCOSE 87  --  237* 234* 378* 389* 148*  --  147*  --   BUN 87*  --  81* 74* 77* 77* 86*  --  93*  --   CREATININE 6.60*  --  5.81* 5.12* 5.14* 4.84* 4.83*  --  4.89*  --   CALCIUM 8.4*  --  7.8* 7.4* 7.4* 7.4* 7.4*  --  8.0*  --   PHOS  --   --  3.8 4.1  --  3.7 2.1*  --  3.9  --    < > = values in this interval not displayed.   Iron/TIBC/Ferritin/ %Sat    Component Value Date/Time   IRON 78 12/19/2018 1428   TIBC 162 (L) 12/19/2018 1428   FERRITIN 4,335 (H) 01/16/2019 0020   IRONPCTSAT 48 (H) 12/19/2018 1428    Vitals:   01/16/19 1400 01/16/19 1500 01/16/19 1515 01/16/19 1530  BP: (!) 159/68 (!) 167/66  (!) 163/65  Pulse: 83 87 82 81  Resp: (!) 41 (!) 30 (!) 41 (!) 36  Temp:  98.9 F (37.2 C)  98.8 F (37.1 C)  TempSrc:  Axillary  Axillary  SpO2: 100% 100% 100% 100%  Weight:      Height:        Exam Gen small framed older adult male, RR 36, coughing off and on No rash, cyanosis or gangrene Sclera anicteric, throat clear  No jvd or bruits Chest bilat scattered crackles / rhonchi RRR no MRG, tachy Abd soft ntnd no mass or ascites +bs, tx is nontender GU normal male w/ condom cath draining clear urine MS no joint effusions or deformity Ext no LE or UE edema, no wounds or ulcers Neuro is alert, Ox 3 , nf , on asterixis    Home meds:  - aspirin 81  - amlodipine 10/ clonidine 0.2 bid/ labetalol 300 bid  - mycophenolate 500 bid/ tacrolimus 4mg  bid/ prednisone 5 bid/ bactrim mwf 1 tab SS  - sod bicarb 650 tid/ pantoprazole 40  - trazodone 25mg  hs prn  - ipratropium- albuterol q 6 nebs prn    UA 11/8 > negative, prot 100   I/O 10.9L in and 5.2L UOP over 5.5 days, + 5L net        Assessment/ Plan: 1. AKI on CKD IV of renal transplant - baseline creat is 3.5- 4.  Creat 6 on admission here and down to 4.8 now w/ IVF's.  Pt has been getting  all his transplant meds (prograf, cellcept and prednisone).  No new suggestions, vol status is good on exam. Has no gross uremic symtpoms or serious electrolyte issues.  Pt is very frail and very sick acutely, and is DNR. Not sure that RRT would be helpful in this situation. Would consider conservative care. Will follow.  2. PNA - on zyvox and cefepime 3. Recent COVID assoc PNA - on October 4. HTN - on 3 BP meds here, BP's ok      Kelly Splinter  MD 01/16/2019, 5:43 PM

## 2019-01-16 NOTE — Progress Notes (Signed)
PT Cancellation Note  Patient Details Name: Francisco Williamson MRN: [id number] DOB: 09/21/1945   Cancelled Treatment:    Reason Eval/Treat Not Completed: Other (comment). RN preparing to hang  blood. Will follow-up for PT treatment as schedule permits.  Mabeline Caras, PT, DPT Acute Rehabilitation Services  Pager [phone] Office [place] 01/16/2019, 3:26 PM

## 2019-01-16 NOTE — Progress Notes (Signed)
Pharmacy Antibiotic Note  Francisco Williamson is a 73 y.o. male recently admitted 10/28-11/3 with COVID PNA who represents on 01/11/2019 with AMS/SOB concerning for PNA. Pharmacy has been consulted for Vancomycin + Cefepime dosing.   The patient has a history of a renal transplant and had noted AKI last admission with recurrent AKI this admission - SCr 6.6 (BL 3-4?). Discussed with Dr. Maryan Rued to consider alternatives for Vancomycin who has decided to hold off on Vancomycin and gram positive coverage for now and continue with Cefepime only. Will allow admitting MD to decide if gram positive coverage is warranted and if that should be Zyvox instead of Vancomycin.   11/13 SCr 4.89 Neprhology to see patient today for possible CRRT  Plan: - Continue cefepime 1 g iv q 24h. Day 5/7 - F/U plans for CRRT and increase dose as indicated  Height: 5\' 5"  (165.1 cm) Weight: 96 lb 6.4 oz (43.7 kg) IBW/kg (Calculated) : 61.5  Temp (24hrs), Avg:98.5 F (36.9 C), Min:97.3 F (36.3 C), Max:100.5 F (38.1 C)  Recent Labs  Lab 01/11/19 0800 01/11/19 1004 01/11/19 1145  01/13/19 0334 01/13/19 1540 01/13/19 2025 01/14/19 0100 01/15/19 0000 01/16/19 0020 01/16/19 0745 01/16/19 1045  WBC 12.9*  --   --    < > 8.0  --  8.6 8.9 14.5* 14.8*  --   --   CREATININE 6.60*  --   --    < > 5.12* 5.14*  --  4.84* 4.83* 4.89*  --   --   LATICACIDVEN 2.3* 1.0 1.7  --   --   --   --   --   --   --  1.5 1.1   < > = values in this interval not displayed.    Estimated Creatinine Clearance: 8.6 mL/min (A) (by C-G formula based on SCr of 4.89 mg/dL (H)).    No Known Allergies  Antimicrobials this admission: Cefepime 11/8 >> 11/15  Microbiology results: 11/8 BCx >> ngf 11/8 UCx: 40k S epidermidis  11/13 BCx >> ngtd  Thank you for allowing pharmacy to be a part of this patient's care.  Ulice Dash, PharmD, BCPS Clinical Pharmacist

## 2019-01-17 ENCOUNTER — Inpatient Hospital Stay (HOSPITAL_COMMUNITY): Payer: Medicare Other

## 2019-01-17 DIAGNOSIS — N179 Acute kidney failure, unspecified: Secondary | ICD-10-CM

## 2019-01-17 DIAGNOSIS — N184 Chronic kidney disease, stage 4 (severe): Secondary | ICD-10-CM

## 2019-01-17 DIAGNOSIS — R0682 Tachypnea, not elsewhere classified: Secondary | ICD-10-CM

## 2019-01-17 DIAGNOSIS — R0602 Shortness of breath: Secondary | ICD-10-CM

## 2019-01-17 DIAGNOSIS — E870 Hyperosmolality and hypernatremia: Secondary | ICD-10-CM | POA: Diagnosis not present

## 2019-01-17 LAB — MAGNESIUM: Magnesium: 1.8 mg/dL (ref 1.7–2.4)

## 2019-01-17 LAB — CBC WITH DIFFERENTIAL/PLATELET
Abs Immature Granulocytes: 0.31 10*3/uL — ABNORMAL HIGH (ref 0.00–0.07)
Basophils Absolute: 0 10*3/uL (ref 0.0–0.1)
Basophils Relative: 0 %
Eosinophils Absolute: 0 10*3/uL (ref 0.0–0.5)
Eosinophils Relative: 0 %
HCT: 28.2 % — ABNORMAL LOW (ref 39.0–52.0)
Hemoglobin: 9.5 g/dL — ABNORMAL LOW (ref 13.0–17.0)
Immature Granulocytes: 2 %
Lymphocytes Relative: 4 %
Lymphs Abs: 0.6 10*3/uL — ABNORMAL LOW (ref 0.7–4.0)
MCH: 28.5 pg (ref 26.0–34.0)
MCHC: 33.7 g/dL (ref 30.0–36.0)
MCV: 84.7 fL (ref 80.0–100.0)
Monocytes Absolute: 0.3 10*3/uL (ref 0.1–1.0)
Monocytes Relative: 2 %
Neutro Abs: 13.6 10*3/uL — ABNORMAL HIGH (ref 1.7–7.7)
Neutrophils Relative %: 92 %
Platelets: 148 10*3/uL — ABNORMAL LOW (ref 150–400)
RBC: 3.33 MIL/uL — ABNORMAL LOW (ref 4.22–5.81)
RDW: 16.1 % — ABNORMAL HIGH (ref 11.5–15.5)
WBC: 14.8 10*3/uL — ABNORMAL HIGH (ref 4.0–10.5)
nRBC: 0 % (ref 0.0–0.2)

## 2019-01-17 LAB — COMPREHENSIVE METABOLIC PANEL
ALT: 31 U/L (ref 0–44)
AST: 43 U/L — ABNORMAL HIGH (ref 15–41)
Albumin: 2.5 g/dL — ABNORMAL LOW (ref 3.5–5.0)
Alkaline Phosphatase: 67 U/L (ref 38–126)
Anion gap: 15 (ref 5–15)
BUN: 100 mg/dL — ABNORMAL HIGH (ref 8–23)
CO2: 21 mmol/L — ABNORMAL LOW (ref 22–32)
Calcium: 8.3 mg/dL — ABNORMAL LOW (ref 8.9–10.3)
Chloride: 114 mmol/L — ABNORMAL HIGH (ref 98–111)
Creatinine, Ser: 5.12 mg/dL — ABNORMAL HIGH (ref 0.61–1.24)
GFR calc Af Amer: 12 mL/min — ABNORMAL LOW (ref >60)
GFR calc non Af Amer: 10 mL/min — ABNORMAL LOW (ref >60)
Glucose, Bld: 90 mg/dL (ref 70–99)
Potassium: 2.9 mmol/L — ABNORMAL LOW (ref 3.5–5.1)
Sodium: 150 mmol/L — ABNORMAL HIGH (ref 135–145)
Total Bilirubin: 1.1 mg/dL (ref 0.3–1.2)
Total Protein: 7.2 g/dL (ref 6.5–8.1)

## 2019-01-17 LAB — D-DIMER, QUANTITATIVE: D-Dimer, Quant: 5.77 ug/mL-FEU — ABNORMAL HIGH (ref 0.00–0.50)

## 2019-01-17 LAB — FERRITIN: Ferritin: 4758 ng/mL — ABNORMAL HIGH (ref 24–336)

## 2019-01-17 LAB — C-REACTIVE PROTEIN: CRP: 22.2 mg/dL — ABNORMAL HIGH (ref <1.0)

## 2019-01-17 LAB — PHOSPHORUS: Phosphorus: 4 mg/dL (ref 2.5–4.6)

## 2019-01-17 LAB — PROCALCITONIN: Procalcitonin: 11.99 ng/mL

## 2019-01-17 MED ORDER — POTASSIUM CHLORIDE CRYS ER 20 MEQ PO TBCR
40.0000 meq | EXTENDED_RELEASE_TABLET | Freq: Once | ORAL | Status: AC
  Administered 2019-01-17: 11:00:00 40 meq via ORAL
  Filled 2019-01-17: qty 2

## 2019-01-17 MED ORDER — FUROSEMIDE 10 MG/ML IJ SOLN: 120.0000 mg | Freq: Every day | INTRAVENOUS | Status: DC

## 2019-01-17 MED ORDER — INSULIN ASPART 100 UNIT/ML ~~LOC~~ SOLN
10.0000 [IU] | Freq: Once | SUBCUTANEOUS | Status: AC
  Administered 2019-01-17: 10 [IU] via SUBCUTANEOUS

## 2019-01-17 MED ORDER — POTASSIUM CHLORIDE 10 MEQ/100ML IV SOLN
10.0000 meq | INTRAVENOUS | Status: AC
  Administered 2019-01-17 (×4): 10 meq via INTRAVENOUS
  Filled 2019-01-17 (×3): qty 100

## 2019-01-17 MED ORDER — DEXAMETHASONE SODIUM PHOSPHATE 10 MG/ML IJ SOLN
6.0000 mg | INTRAMUSCULAR | Status: DC
  Administered 2019-01-17 – 2019-01-21 (×5): 6 mg via INTRAVENOUS
  Filled 2019-01-17: qty 1
  Filled 2019-01-17: qty 0.6
  Filled 2019-01-17: qty 1
  Filled 2019-01-17: qty 0.6
  Filled 2019-01-17: qty 1

## 2019-01-17 MED ORDER — SODIUM CHLORIDE 0.9 % IV SOLN
100.0000 mg | Freq: Every day | INTRAVENOUS | Status: AC
  Administered 2019-01-17 – 2019-01-21 (×5): 100 mg via INTRAVENOUS
  Filled 2019-01-17: qty 100
  Filled 2019-01-17: qty 20
  Filled 2019-01-17 (×3): qty 100

## 2019-01-17 MED ORDER — INSULIN ASPART 100 UNIT/ML ~~LOC~~ SOLN
15.0000 [IU] | Freq: Once | SUBCUTANEOUS | Status: AC
  Administered 2019-01-17: 15 [IU] via SUBCUTANEOUS

## 2019-01-17 MED ORDER — POTASSIUM CHLORIDE CRYS ER 20 MEQ PO TBCR
40.0000 meq | EXTENDED_RELEASE_TABLET | Freq: Once | ORAL | Status: AC
  Administered 2019-01-17: 19:00:00 40 meq via ORAL
  Filled 2019-01-17: qty 2

## 2019-01-17 MED ORDER — SODIUM CHLORIDE 0.9% IV SOLUTION
Freq: Once | INTRAVENOUS | Status: AC
  Administered 2019-01-17: 23:00:00 via INTRAVENOUS

## 2019-01-17 NOTE — Progress Notes (Addendum)
CBG recheck: 410,   Dr. Alcario Drought on floor at this time, informed him of patient status and he gave verbal order to give patient additional 15 units of Novolog and recheck in one hour. He also expressed that he would change the sliding scale for this patient.

## 2019-01-17 NOTE — NC FL2 (Signed)
[place] MEDICAID FL2 LEVEL OF CARE SCREENING TOOL     IDENTIFICATION  Patient Name: Francisco Williamson Birthdate: 02/13/1946 Sex: male Admission Date (Current Location): 01/11/2019  [facility] and [place] Number:  Herbalist and Address:  [facility], [place] [address] [place], [place] [address]      Provider Number: [id number]  Attending Physician Name and Address:  [name], MD  Relative Name and Phone Number:       Current Level of Care: Hospital Recommended Level of Care: [place] Prior Approval Number:    Date Approved/Denied:   PASRR Number: [id number]  Discharge Plan: SNF    Current Diagnoses: Patient Active Problem List   Diagnosis Date Noted  . Bilateral pleural effusion 01/16/2019  . Sepsis secondary to UTI ([place]) 01/14/2019  . HCAP (healthcare-associated pneumonia) 01/14/2019  . Acute renal failure superimposed on chronic kidney disease ([place]) 01/11/2019  . Essential hypertension 01/11/2019  . History of renal transplant 01/11/2019  . Severe sepsis ([place]) 01/11/2019  . Acute respiratory failure with hypoxia ([place]) 01/05/2019  . Pneumonia due to COVID-19 virus 01/01/2019  . CAP (community acquired pneumonia) 12/31/2018  . CKD stage 5 secondary to hypertension ([place]) 12/31/2018  . Hyperkalemia 12/31/2018  . Anemia in chronic kidney disease 09/29/2015    Orientation RESPIRATION BLADDER Height & Weight     Self, Place, Situation  O2(HFNC 7L) External catheter, Incontinent(placed 11/8) Weight: 104 lb 4 oz (47.3 kg) Height:  5\' 5"  (165.1 cm)  BEHAVIORAL SYMPTOMS/MOOD NEUROLOGICAL BOWEL NUTRITION STATUS      Continent Diet(renal/carb modified, Fluid restriction: 1200 mL Fluid)  AMBULATORY STATUS COMMUNICATION OF NEEDS Skin   Limited Assist Verbally Normal                       Personal Care Assistance Level of Assistance  Bathing, Feeding, Dressing Bathing Assistance: Limited assistance Feeding  assistance: Independent Dressing Assistance: Limited assistance     Functional Limitations Info  Sight, Hearing, Speech Sight Info: Adequate Hearing Info: Adequate Speech Info: Adequate    SPECIAL CARE FACTORS FREQUENCY  PT (By licensed PT), OT (By licensed OT)     PT Frequency: 5x OT Frequency: 5x            Contractures Contractures Info: Not present    Additional Factors Info  Code Status, Allergies, Isolation Precautions Code Status Info: DNR Allergies Info: no known allergies     Isolation Precautions Info: Air/con, COVID     Current Medications (01/17/2019):  This is the current hospital active medication list Current Facility-Administered Medications  Medication Dose Route Frequency Provider Last Rate Last Dose  . 0.9 %  sodium chloride infusion  250 mL Intravenous PRN Karmen Bongo, MD      . acetaminophen (TYLENOL) tablet 650 mg  650 mg Oral Q6H PRN Karmen Bongo, MD   650 mg at 01/16/19 0520  . amLODipine (NORVASC) tablet 10 mg  10 mg Oral Daily Elgergawy, Silver Huguenin, MD   10 mg at 01/17/19 1005  . aspirin EC tablet 81 mg  81 mg Oral Daily Karmen Bongo, MD   81 mg at 01/17/19 1004  . bisacodyl (DULCOLAX) EC tablet 5 mg  5 mg Oral Daily PRN Karmen Bongo, MD      . calcium carbonate (dosed in mg elemental calcium) suspension 500 mg of elemental calcium  500 mg of elemental calcium Oral Q6H PRN Karmen Bongo, MD      .  camphor-menthol (SARNA) lotion 1 application  1 application Topical [id number] PRN Karmen Bongo, MD       And  . hydrOXYzine (ATARAX/VISTARIL) tablet 25 mg  25 mg Oral Q8H PRN Karmen Bongo, MD      . ceFEPIme (MAXIPIME) 1 g in sodium chloride 0.9 % 100 mL IVPB  1 g Intravenous Q24H Karmen Bongo, MD 200 mL/hr at 01/16/19 2050 1 g at 01/16/19 2050  . cloNIDine (CATAPRES) tablet 0.2 mg  0.2 mg Oral BID Elgergawy, Silver Huguenin, MD   0.2 mg at 01/17/19 1005  . docusate sodium (COLACE) capsule 100 mg  100 mg Oral BID Karmen Bongo, MD   100 mg  at 01/17/19 1005  . docusate sodium (ENEMEEZ) enema 283 mg  1 enema Rectal PRN Karmen Bongo, MD      . feeding supplement (ENSURE ENLIVE) (ENSURE ENLIVE) liquid 237 mL  237 mL Oral TID BM Elgergawy, Silver Huguenin, MD   237 mL at 01/17/19 1005  . hydrALAZINE (APRESOLINE) injection 5 mg  5 mg Intravenous Q4H PRN Karmen Bongo, MD   5 mg at 01/16/19 0423  . hydrocortisone sodium succinate (SOLU-CORTEF) 100 MG injection 50 mg  50 mg Intravenous Q6H Karmen Bongo, MD   50 mg at 01/17/19 0531  . insulin aspart (novoLOG) injection 0-9 Units  0-9 Units Subcutaneous Q4H [name], MD   1 Units at 01/17/19 0802  . Ipratropium-Albuterol (COMBIVENT) respimat 1 puff  1 puff Inhalation Q6H Karmen Bongo, MD   1 puff at 01/17/19 0803  . labetalol (NORMODYNE) injection 5-10 mg  5-10 mg Intravenous Q2H PRN Etta Quill, DO   5 mg at 01/16/19 0542  . MEDLINE mouth rinse  15 mL Mouth Rinse BID Elgergawy, Silver Huguenin, MD   15 mL at 01/17/19 1006  . metoprolol tartrate (LOPRESSOR) tablet 50 mg  50 mg Oral BID [name], MD   50 mg at 01/17/19 1013  . mycophenolate (CELLCEPT) capsule 500 mg  500 mg Oral BID Karmen Bongo, MD   500 mg at 01/17/19 1005  . oxyCODONE (Oxy IR/ROXICODONE) immediate release tablet 5 mg  5 mg Oral Q4H PRN Karmen Bongo, MD   5 mg at 01/14/19 2107  . pantoprazole (PROTONIX) injection 40 mg  40 mg Intravenous Q12H Elgergawy, Silver Huguenin, MD   40 mg at 01/17/19 1005  . polyethylene glycol (MIRALAX / GLYCOLAX) packet 17 g  17 g Oral Daily PRN Karmen Bongo, MD      . potassium chloride 10 mEq in 100 mL IVPB  10 mEq Intravenous Q1 Hr x 4 [name], MD      . potassium chloride SA (KLOR-CON) CR tablet 40 mEq  40 mEq Oral Once [name], MD      . sodium chloride 0.45 % 1,000 mL with potassium chloride 40 mEq infusion   Intravenous Continuous [name], MD 50 mL/hr at 01/15/19 1700    . sodium chloride flush (NS) 0.9 % injection 3 mL  3 mL Intravenous PRN Karmen Bongo, MD      . sodium chloride flush (NS) 0.9 % injection 3 mL  3 mL Intravenous Q12H Karmen Bongo, MD   3 mL at 01/17/19 1005  . sorbitol 70 % solution 30 mL  30 mL Oral PRN Karmen Bongo, MD      . sulfamethoxazole-trimethoprim (BACTRIM) 400-80 MG per tablet 1 tablet  1 tablet Oral Q M,W,F Elgergawy, Silver Huguenin, MD   1 tablet at  01/16/19 1025  . tacrolimus (PROGRAF) capsule 4 mg  4 mg Oral BID Karmen Bongo, MD   4 mg at 01/17/19 1005  . zolpidem (AMBIEN) tablet 5 mg  5 mg Oral QHS PRN Karmen Bongo, MD         Discharge Medications: Please see discharge summary for a list of discharge medications.  Relevant Imaging Results:  Relevant Lab Results:   Additional Information [id number]  Eileen Stanford, LCSW

## 2019-01-17 NOTE — TOC Initial Note (Signed)
Transition of Care [facility]) - Initial/Assessment Note    Patient Details  Name: Francisco Williamson MRN: [id number] Date of Birth: 02/10/1946  [facility]) CM/SW Contact:    Eileen Stanford, LCSW Phone Number: 01/17/2019, 10:33 AM  Clinical Narrative:    Pt alert to self, place, and situation. Pt lives alone. CSW spoke with pt regarding SNF. Pt is agreeable. At this time [facility] in [place] have offered. Pt would like to go to [place].  CSW will notified SNF.               Expected Discharge Plan: Skilled Nursing Facility Barriers to Discharge: Continued Medical Work up   Patient Goals and CMS Choice Patient states their goals for this hospitalization and ongoing recovery are:: "to go stronger"   Choice offered to / list presented to : Patient  Expected Discharge Plan and Services Expected Discharge Plan: [place] In-house Referral: Clinical Social Work   Post Acute Care Choice: [place] Living arrangements for the past 2 months: Single Family Home                           HH Arranged: NA          Prior Living Arrangements/Services Living arrangements for the past 2 months: Single Family Home Lives with:: Self Patient language and need for interpreter reviewed:: Yes Do you feel safe going back to the place where you live?: Yes      Need for Family Participation in Patient Care: Yes (Comment) Care giver support system in place?: Yes (comment)   Criminal Activity/Legal Involvement Pertinent to Current Situation/Hospitalization: No - Comment as needed  Activities of Daily Living Home Assistive Devices/Equipment: None ADL Screening (condition at time of admission) Patient's cognitive ability adequate to safely complete daily activities?: Yes Is the patient deaf or have difficulty hearing?: No Does the patient have difficulty seeing, even when wearing glasses/contacts?: No Does the patient have difficulty concentrating,  remembering, or making decisions?: No Patient able to express need for assistance with ADLs?: Yes Does the patient have difficulty dressing or bathing?: Yes Independently performs ADLs?: No Does the patient have difficulty walking or climbing stairs?: Yes Weakness of Legs: Both Weakness of Arms/Hands: Both  Permission Sought/Granted Permission sought to share information with : Family Supports    Share Information with NAME: [place]  Permission granted to share info w AGENCY: [place] granted to share info w Relationship: neice  Permission granted to share info w Contact Information: [phone]  Emotional Assessment Appearance:: Appears stated age Attitude/Demeanor/Rapport: Engaged Affect (typically observed): Accepting, Appropriate, Calm Orientation: : Oriented to Self, Oriented to Place, Oriented to Situation Alcohol / Substance Use: Not Applicable Psych Involvement: No (comment)  Admission diagnosis:  Dehydration [E86.0] Hypernatremia [E87.0] Acute kidney injury ([place]) [N17.9] Pneumonia due to COVID-19 virus [U07.1, J12.89] Patient Active Problem List   Diagnosis Date Noted  . Bilateral pleural effusion 01/16/2019  . Sepsis secondary to UTI ([place]) 01/14/2019  . HCAP (healthcare-associated pneumonia) 01/14/2019  . Acute renal failure superimposed on chronic kidney disease ([place]) 01/11/2019  . Essential hypertension 01/11/2019  . History of renal transplant 01/11/2019  . Severe sepsis ([place]) 01/11/2019  . Acute respiratory failure with hypoxia ([place]) 01/05/2019  . Pneumonia due to COVID-19 virus 01/01/2019  . CAP (community acquired pneumonia) 12/31/2018  . CKD stage 5 secondary to hypertension ([place]) 12/31/2018  . Hyperkalemia 12/31/2018  . Anemia in  chronic kidney disease 09/29/2015   PCP:  Default, Provider, MD Pharmacy:   [id number] - [place], [address] [place] [address] Phone: [phone] Fax:  [phone]     Social Determinants of Health (SDOH) Interventions    Readmission Risk Interventions No flowsheet data found.

## 2019-01-17 NOTE — Progress Notes (Signed)
CBG: [place] (Dr. Alcario Drought), order to administer 10 units Novolog and recheck CBG post one hour.

## 2019-01-17 NOTE — Progress Notes (Addendum)
PROGRESS NOTE    Francisco Williamson  [id number] DOB: March 16, 1945 DOA: 01/11/2019 PCP: Default, Provider, MD   Brief Narrative:  73 y.o.BM PMHx Essential HTN and CKD stage V (s/p renal transplantation), deconditioning,  Presenting with AMS. He was admitted to [facility] from 10/28-11/3 for COVID-19-associated PNA with acute respiratory failure with hypoxia. He completed a course of Remdesivir and steroids, and he was discharged home on 11/3, patient was brought back to ED, given failure to thrive, dehydration and altered mental status, he was noted to have significant dehydration with hypernatremia and renal failure, he was transferred to [facility] for further management.    Subjective: 11/14 afebrile last 24 hours, negative CP, negative abdominal pain.  Positive S OB   Assessment & Plan:   Principal Problem:   Acute renal failure superimposed on chronic kidney disease (HCC) Active Problems:   CKD stage 5 secondary to hypertension ([place])   Pneumonia due to COVID-19 virus   Essential hypertension   History of renal transplant   Severe sepsis (HCC)   Sepsis secondary to UTI ([place])   HCAP (healthcare-associated pneumonia)   Bilateral pleural effusion   Acute hypernatremia      Sepsis due to staph epidermidis UTI -Afebrile, negative leukocytosis.  Will receive 2 doses of vancomycin but given his renal function is = 7 doses. -Hold all nephrotoxic medication -Bactrim prophylaxis for his renal transplant -CellCept 500 mg  BID for renal transplant  -Tacrolimus 4 mg  BID renal transplant -11/11 Tacrolimus level = 8.5 -11/11 CellCept level pending  Acute on CKD stage V/RENAL transplant patient (Cr = 3.5) Recent Labs  Lab 01/13/19 1540 01/14/19 0100 01/15/19 0000 01/16/19 0020 01/17/19 0027  CREATININE 5.14* 4.84* 4.83* 4.89* 5.12*  -See sepsis UTI -Strict in and out +5.4 L -Daily weight Filed Weights   01/12/19 0600 01/17/19 0426 01/17/19 0430  Weight: 43.7 kg 47.3 kg 47.3  kg  -11/12 Albumin 50 g + Lasix IV 60 mg -11/13 Albumin 50 g, followed by Lasix IV 120 mg TID (discussed case with nephrology Dr. Jonnie Finner); will see patient today.  Recommendations are for conservative care no CRRT at this time.  -11/14 discussed case with Dr. Jonnie Finner nephrology he would like for me to hold Lasix until kidney function improves.  We will continue to monitor kidney function closely  HCAP/Covid 19 pneumonia  -On previous admission patient completed treatment of remdesivir+ steroids -Not fully convinced patient has HCAP, however patient has elevated procalcitonin level Procalcitonin 11/8 (42.95), 11/11 (26.99), complete 7-day course cefepime -11/13 received 1 dose of vancomycin COVID-19 Labs  Recent Labs    01/15/19 0000 01/16/19 0020 01/17/19 0027  DDIMER 1.13* 1.86* 5.77*  FERRITIN 4,995* 4,335* 4,758*  CRP 10.2* 14.4* 22.2*    Lab Results  Component Value Date   SARSCOV2NAA POSITIVE (A) 12/31/2018  -11/14 patient has worsened with treatment for HCAP believe this is a viral pneumonia (recurrence of Covid/infection different strain Covid). -11/14 Decadron 6 mg -11/14 Remdesivir per pharmacy protocol -11/14 Covid convalescent plasma  Bilateral pleural effusion/Hypoalbuminemia -See acute on CKD stage V  Essential HTN -Amlodipine 10 mg daily -Clonidine 0.2 mg BID -Hydralazine PRN -Labetalol PRN -11/13 increase Metoprolol 50 mg BID  GI bleed Recent Labs  Lab 01/15/19 0755 01/15/19 1310 01/16/19 0020 01/16/19 0535 01/17/19 0027  HGB 7.7* 7.6* 7.1* 8.8* 9.5*  -Per EMR overnight 1910: Patient had small amount of blood with his stool, will hold his heparin subcu, will repeat CBC, will get type and screen , and  start protonix 40 mg IV BID. -11/13 occult blood pending -11/13 transfuse 1 unit PRBC -Transfuse for hemoglobin<7   Hypernatremia -11/14 secondary to dehydration; iatrogenic patient on large dose of Lasix.  D/Ced Lasix. -See renal  failure  Hypokalemia -Potassium goal>4 -Continue 0.45% saline + KCl 40 mEq at 81ml/hr -11/14 potassium IV 40 mEq + K-Dur 40 mEq -11/14 recheck K/Mg 1500 -See renal failure   Hypomagnesmia -Magnesium goal> 2  Hyperglycemia -Sensitive SSI   Failure to thrive -Patient unable to care for himself at home.  Discharged on 11/3 and readmitted on 11/8.  Patient now with worsening acute on CKD stage V.  Clear that if patient goes home instead of to a SNF is at high risk for complete renal failure of his transplanted kidney.  -11/14 palliative care; patient with multiple medical problems was discharged a week ago for Covid pneumonia return within a week with acute respiratory failure with hypoxia and increasing renal failure (renal transplant patient).  Appears patient may not survive this hospitalization discuss hospice   DVT prophylaxis: SCD Code Status: DNR Family Communication: 11/14 spoke with Lyanne Co (niece) counseled her on plan of care answered all questions Disposition Plan: SNF?   Consultants:    Procedures/Significant Events:  11/8 PCXR; bilateral patchy areas of consolidation may represent multifocal pneumonia vs atypical/viral infectious process. 11/12 PCXR-worsening multifocal airspace opacities.  -New small to moderate-sized bilateral pleural effusions. 11/13 transfuse 1 unit PRBC 11/14 PCXR;-no significant interval change in AP portable examination with diffuse interstitial and heterogeneous airspace opacity.  -Findings remain consistent with multifocal infection and/or edema   I have personally reviewed and interpreted all radiology studies and my findings are as above.  VENTILATOR SETTINGS: HFNC Flow; 7 L/min SPO2; 100%      Cultures 11/13 blood pending 11/13 urine pending     Antimicrobials: Anti-infectives (From admission, onward)   Start     Stop   01/17/19 1300  remdesivir 100 mg in sodium chloride 0.9 % 250 mL IVPB     01/22/19 0959    01/16/19 1800  vancomycin (VANCOCIN) 500 mg in sodium chloride 0.9 % 100 mL IVPB     01/16/19 2323   01/13/19 1900  vancomycin (VANCOCIN) IVPB 1000 mg/200 mL premix     01/13/19 2200   01/12/19 1800  ceFEPIme (MAXIPIME) 1 g in sodium chloride 0.9 % 100 mL IVPB     01/17/19 2359   01/12/19 1600  sulfamethoxazole-trimethoprim (BACTRIM) 400-80 MG per tablet 1 tablet    Note to Pharmacy: Take one tablet by mouth on Monday, Wednesday and fridays.         01/11/19 0830  vancomycin (VANCOCIN) IVPB 1000 mg/200 mL premix  Status:  Discontinued     01/11/19 0901   01/11/19 0830  ceFEPIme (MAXIPIME) 2 g in sodium chloride 0.9 % 100 mL IVPB     01/11/19 0952       Devices    LINES / TUBES:      Continuous Infusions:  sodium chloride     ceFEPime (MAXIPIME) IV 1 g (01/16/19 2050)   [START ON 01/18/2019] furosemide     potassium chloride Stopped (01/17/19 1320)   remdesivir 100 mg in NS 250 mL 100 mg (01/17/19 1331)   sodium chloride 0.45 % with kcl 50 mL/hr at 01/15/19 1700     Objective: Vitals:   01/17/19 1103 01/17/19 1121 01/17/19 1200 01/17/19 1245  BP:  (!) 151/65    Pulse:  90    Resp:  Marland Kitchen)  28    Temp:  97.6 F (36.4 C)    TempSrc:  Oral    SpO2: 97% 99% 100% 99%  Weight:      Height:        Intake/Output Summary (Last 24 hours) at 01/17/2019 1447 Last data filed at 01/17/2019 1331 Gross per 24 hour  Intake 1162.33 ml  Output 3000 ml  Net -1837.67 ml   Filed Weights   01/12/19 0600 01/17/19 0426 01/17/19 0430  Weight: 43.7 kg 47.3 kg 47.3 kg   Physical Exam:  General: A/O x4, positive acute respiratory distress (worsening), cachectic Eyes: negative scleral hemorrhage, negative anisocoria, negative icterus ENT: Negative Runny nose, negative gingival bleeding, Neck:  Negative scars, masses, torticollis, lymphadenopathy, JVD Lungs: Tachypneic, decreased bibasilar breath sounds with crackles RLL, negative wheeze Cardiovascular: Regular rate and rhythm  without murmur gallop or rub normal S1 and S2 Abdomen: negative abdominal pain, nondistended, positive soft, bowel sounds, no rebound, no ascites, no appreciable mass Extremities: No significant cyanosis, clubbing, or edema bilateral lower extremities Skin: Negative rashes, lesions, ulcers Psychiatric:  Negative depression, negative anxiety, negative fatigue, negative mania  Central nervous system:  Cranial nerves II through XII intact, tongue/uvula midline, all extremities muscle strength 5/5, sensation intact throughout, negative dysarthria, negative expressive aphasia, negative receptive aphasia. .     Data Reviewed: Care during the described time interval was provided by me .  I have reviewed this patient's available data, including medical history, events of note, physical examination, and all test results as part of my evaluation.   CBC: Recent Labs  Lab 01/13/19 0334 01/13/19 2025 01/14/19 0100  01/15/19 0000 01/15/19 0755 01/15/19 1310 01/16/19 0020 01/16/19 0535 01/17/19 0027  WBC 8.0 8.6 8.9  --  14.5*  --   --  14.8*  --  14.8*  NEUTROABS 6.9  --  7.7  --  13.4*  --   --  13.5*  --  13.6*  HGB 8.4* 7.6* 7.2*   < > 7.4* 7.7* 7.6* 7.1* 8.8* 9.5*  HCT 25.8* 23.0* 22.1*   < > 21.8* 22.6* 23.1* 21.5* 26.0* 28.2*  MCV 87.8 87.1 87.4  --  85.5  --   --  87.8  --  84.7  PLT 127* 115* 120*  --  123*  --   --  132*  --  148*   < > = values in this interval not displayed.   Basic Metabolic Panel: Recent Labs  Lab 01/13/19 0334 01/13/19 1540 01/14/19 0100 01/15/19 0000 01/15/19 1540 01/16/19 0020 01/16/19 0535 01/17/19 0027  NA 147* 141 137 138  --  144 147* 150*  K 3.2* 3.4* 3.6 3.3* 4.4 4.2 3.4* 2.9*  CL 108 104 104 105  --  112*  --  114*  CO2 28 25 22  21*  --  19*  --  21*  GLUCOSE 234* 378* 389* 148*  --  147*  --  90  BUN 74* 77* 77* 86*  --  93*  --  100*  CREATININE 5.12* 5.14* 4.84* 4.83*  --  4.89*  --  5.12*  CALCIUM 7.4* 7.4* 7.4* 7.4*  --  8.0*  --  8.3*   MG 1.5*  --  2.2 2.0 1.9 1.9  --  1.8  PHOS 4.1  --  3.7 2.1*  --  3.9  --  4.0   GFR: Estimated Creatinine Clearance: 8.9 mL/min (A) (by C-G formula based on SCr of 5.12 mg/dL (H)). Liver Function Tests: Recent Labs  Lab 01/13/19 0334 01/14/19 0100 01/15/19 0000 01/16/19 0020 01/17/19 0027  AST 28 38 60* 53* 43*  ALT 17 20 38 37 31  ALKPHOS 44 47 66 65 67  BILITOT 1.5* 0.8 0.6 1.2 1.1  PROT 6.7 6.0* 6.4* 7.0 7.2  ALBUMIN 1.7* 1.6* 1.6* 2.5* 2.5*   No results for input(s): LIPASE, AMYLASE in the last 168 hours. No results for input(s): AMMONIA in the last 168 hours. Coagulation Profile: Recent Labs  Lab 01/11/19 0800  INR 1.5*   Cardiac Enzymes: No results for input(s): CKTOTAL, CKMB, CKMBINDEX, TROPONINI in the last 168 hours. BNP (last 3 results) No results for input(s): PROBNP in the last 8760 hours. HbA1C: No results for input(s): HGBA1C in the last 72 hours. CBG: Recent Labs  Lab 01/15/19 2153 01/16/19 0048 01/16/19 0410 01/16/19 0740 01/16/19 1745  GLUCAP 148* 157* 114* 137* 177*   Lipid Profile: No results for input(s): CHOL, HDL, LDLCALC, TRIG, CHOLHDL, LDLDIRECT in the last 72 hours. Thyroid Function Tests: No results for input(s): TSH, T4TOTAL, FREET4, T3FREE, THYROIDAB in the last 72 hours. Anemia Panel: Recent Labs    01/16/19 0020 01/17/19 0027  FERRITIN 4,335* 4,758*   Urine analysis:    Component Value Date/Time   COLORURINE AMBER (A) 01/11/2019 1455   APPEARANCEUR CLOUDY (A) 01/11/2019 1455   LABSPEC 1.013 01/11/2019 1455   PHURINE 5.0 01/11/2019 1455   GLUCOSEU 50 (A) 01/11/2019 1455   HGBUR MODERATE (A) 01/11/2019 1455   BILIRUBINUR NEGATIVE 01/11/2019 1455   KETONESUR NEGATIVE 01/11/2019 1455   PROTEINUR 100 (A) 01/11/2019 1455   UROBILINOGEN 1.0 12/20/2014 1252   NITRITE NEGATIVE 01/11/2019 1455   LEUKOCYTESUR NEGATIVE 01/11/2019 1455   Sepsis Labs: @LABRCNTIP (procalcitonin:4,lacticidven:4)  ) Recent Results (from the  past 240 hour(s))  Blood Culture (routine x 2)     Status: None   Collection Time: 01/11/19  8:00 AM   Specimen: BLOOD LEFT FOREARM  Result Value Ref Range Status   Specimen Description BLOOD LEFT FOREARM  Final   Special Requests   Final    BOTTLES DRAWN AEROBIC AND ANAEROBIC Blood Culture adequate volume   Culture   Final    NO GROWTH 5 DAYS Performed at [facility], [place] [address] [place], [place] [address]    Report Status 01/16/2019 FINAL  Final  Blood Culture (routine x 2)     Status: None   Collection Time: 01/11/19  8:15 AM   Specimen: BLOOD RIGHT HAND  Result Value Ref Range Status   Specimen Description BLOOD RIGHT HAND  Final   Special Requests   Final    BOTTLES DRAWN AEROBIC ONLY Blood Culture adequate volume   Culture   Final    NO GROWTH 5 DAYS Performed at [facility], [place] [address], [place], [place] [address]    Report Status 01/16/2019 FINAL  Final  Culture, Urine     Status: Abnormal   Collection Time: 01/11/19  2:55 PM   Specimen: Urine, Clean Catch  Result Value Ref Range Status   Specimen Description URINE, CLEAN CATCH  Final   Special Requests   Final    NONE Performed at [facility], [place] [address] [place], [place] [address]    Culture 40,000 COLONIES/mL STAPHYLOCOCCUS EPIDERMIDIS (A)  Final   Report Status 01/13/2019 FINAL  Final   Organism ID, Bacteria STAPHYLOCOCCUS EPIDERMIDIS (A)  Final      Susceptibility   Staphylococcus epidermidis - MIC*    CIPROFLOXACIN >=  8 RESISTANT Resistant     GENTAMICIN <=0.5 SENSITIVE Sensitive     NITROFURANTOIN <=16 SENSITIVE Sensitive     OXACILLIN >=4 RESISTANT Resistant     TETRACYCLINE 2 SENSITIVE Sensitive     VANCOMYCIN 1 SENSITIVE Sensitive     TRIMETH/SULFA 160 RESISTANT Resistant     CLINDAMYCIN >=8 RESISTANT Resistant     RIFAMPIN <=0.5 SENSITIVE Sensitive     Inducible Clindamycin NEGATIVE Sensitive     * 40,000 COLONIES/mL STAPHYLOCOCCUS EPIDERMIDIS  Culture, blood (routine  x 2)     Status: None (Preliminary result)   Collection Time: 01/16/19  7:45 AM   Specimen: BLOOD  Result Value Ref Range Status   Specimen Description   Final    BLOOD RIGHT ANTECUBITAL Performed at [facility] [address], [place], [place] [address]    Special Requests   Final    BOTTLES DRAWN AEROBIC ONLY Blood Culture adequate volume Performed at [facility] [address], [place], [place] [address]    Culture   Final    NO GROWTH <12 HOURS Performed at [facility] [address], [place], [place] [address]    Report Status PENDING  Incomplete  Culture, blood (routine x 2)     Status: None (Preliminary result)   Collection Time: 01/16/19  7:50 AM   Specimen: BLOOD  Result Value Ref Range Status   Specimen Description   Final    BLOOD RIGHT HAND Performed at [facility] [address], [place], [place] [address]    Special Requests   Final    BOTTLES DRAWN AEROBIC ONLY Blood Culture adequate volume Performed at [facility] [address], [place], [place] [address]    Culture   Final    NO GROWTH <12 HOURS Performed at [facility] [address] [place], [place] [address]    Report Status PENDING  Incomplete         Radiology Studies: Dg Chest 1 View  Result Date: 01/15/2019 CLINICAL DATA:  Shortness of breath with pneumonia. EXAM: CHEST  1 VIEW COMPARISON:  January 11, 2019 FINDINGS: There are diffuse bilateral airspace opacities with progression since the prior study. There are small to moderate-sized bilateral pleural effusions. There is no pneumothorax. The heart size is stable from prior study. There is no acute osseous abnormality. There is no displaced rib fracture. IMPRESSION: Worsening multifocal airspace opacities. New small to moderate-sized bilateral pleural effusions. Electronically Signed   By: Constance Holster M.D.   On: 01/15/2019 15:13   Dg Chest  Port 1 View  Result Date: 01/17/2019 CLINICAL DATA:  Shortness of breath EXAM: PORTABLE CHEST 1 VIEW COMPARISON:  01/16/2019 FINDINGS: No significant interval change in AP portable examination with diffuse interstitial and heterogeneous airspace opacity. Mild cardiomegaly. IMPRESSION: No significant interval change in AP portable examination with diffuse interstitial and heterogeneous airspace opacity. Findings remain consistent with multifocal infection and/or edema. Electronically Signed   By: Eddie Candle M.D.   On: 01/17/2019 12:41   Dg Chest Port 1 View  Result Date: 01/16/2019 CLINICAL DATA:  Bilateral pleural effusions. COVID positive. EXAM: PORTABLE CHEST 1 VIEW COMPARISON:  Radiograph same day 538, 01/15/2019 FINDINGS: Stable mediastinum and cardiac silhouette. There is diffuse airspace disease in the lower lobes not improved from comparison exams. Potential small bilateral effusions. No pneumothorax. No acute osseous abnormality. IMPRESSION: No interval change in bilateral lower lobe airspace disease. Electronically Signed   By:  Suzy Bouchard M.D.   On: 01/16/2019 08:22   Dg Chest Port 1 View  Result Date: 01/16/2019 CLINICAL DATA:  Shortness of breath. EXAM: PORTABLE CHEST 1 VIEW COMPARISON:  Chest x-ray 01/15/2019. FINDINGS: Heart size normal. Diffuse bilateral pulmonary infiltrates again noted. Improved bibasilar atelectasis. Small left pleural effusion again noted. No right pleural effusion noted on today's exam. No pneumothorax. Degenerative change thoracic spine. IMPRESSION: Diffuse bilateral pulmonary infiltrates again noted. Similar findings noted on prior exam. Small left pleural effusion again noted. No right pleural effusion noted on today's exam. 2.  Improved atelectasis in the lung bases. Electronically Signed   By: Marcello Moores  Register   On: 01/16/2019 06:01        Scheduled Meds:  sodium chloride   Intravenous Once   amLODipine  10 mg Oral Daily   aspirin EC  81 mg  Oral Daily   cloNIDine  0.2 mg Oral BID   dexamethasone (DECADRON) injection  6 mg Intravenous Q24H   docusate sodium  100 mg Oral BID   feeding supplement (ENSURE ENLIVE)  237 mL Oral TID BM   insulin aspart  0-9 Units Subcutaneous Q4H   Ipratropium-Albuterol  1 puff Inhalation Q6H   mouth rinse  15 mL Mouth Rinse BID   metoprolol tartrate  50 mg Oral BID   mycophenolate  500 mg Oral BID   pantoprazole (PROTONIX) IV  40 mg Intravenous Q12H   sodium chloride flush  3 mL Intravenous Q12H   sulfamethoxazole-trimethoprim  1 tablet Oral Q M,W,F   tacrolimus  4 mg Oral BID   Continuous Infusions:  sodium chloride     ceFEPime (MAXIPIME) IV 1 g (01/16/19 2050)   [START ON 01/18/2019] furosemide     potassium chloride Stopped (01/17/19 1320)   remdesivir 100 mg in NS 250 mL 100 mg (01/17/19 1331)   sodium chloride 0.45 % with kcl 50 mL/hr at 01/15/19 1700     LOS: 6 days   The patient is critically ill with multiple organ systems failure and requires high complexity decision making for assessment and support, frequent evaluation and titration of therapies, application of advanced monitoring technologies and extensive interpretation of multiple databases. Critical Care Time devoted to patient care services described in this note  Time spent: 40 minutes     Sarvesh Meddaugh, Geraldo Docker, MD [facility] Pager [phone]  If 7PM-7AM, please contact night-coverage [email or web address] Password [facility] 01/17/2019, 2:47 PM

## 2019-01-17 NOTE — Significant Event (Signed)
Called by RN: CBG [phone], looks like he got started on decadron today, will give 10u [place] novolog, repeat CBG in 1H.

## 2019-01-17 NOTE — Progress Notes (Signed)
Kerin Perna updated on pt status and plan of care.

## 2019-01-17 NOTE — Progress Notes (Addendum)
[facility] Progress Note  Subjective: date taken from chart, creat and BUN up after 2.6 L diuresis  Vitals:   01/17/19 1103 01/17/19 1121 01/17/19 1200 01/17/19 1245  BP:  (!) 151/65    Pulse:  90    Resp:  (!) 28    Temp:  97.6 F (36.4 C)    TempSrc:  Oral    SpO2: 97% 99% 100% 99%  Weight:      Height:        Inpatient medications: . sodium chloride   Intravenous Once  . amLODipine  10 mg Oral Daily  . aspirin EC  81 mg Oral Daily  . cloNIDine  0.2 mg Oral BID  . dexamethasone (DECADRON) injection  6 mg Intravenous Q24H  . docusate sodium  100 mg Oral BID  . feeding supplement (ENSURE ENLIVE)  237 mL Oral TID BM  . insulin aspart  0-9 Units Subcutaneous Q4H  . Ipratropium-Albuterol  1 puff Inhalation Q6H  . mouth rinse  15 mL Mouth Rinse BID  . metoprolol tartrate  50 mg Oral BID  . mycophenolate  500 mg Oral BID  . pantoprazole (PROTONIX) IV  40 mg Intravenous Q12H  . sodium chloride flush  3 mL Intravenous Q12H  . sulfamethoxazole-trimethoprim  1 tablet Oral Q M,W,F  . tacrolimus  4 mg Oral BID   . sodium chloride    . ceFEPime (MAXIPIME) IV 1 g (01/16/19 2050)  . [START ON 01/18/2019] furosemide    . remdesivir 100 mg in NS 250 mL 100 mg (01/17/19 1331)  . sodium chloride 0.45 % with kcl 50 mL/hr at 01/15/19 1700   sodium chloride, acetaminophen, bisacodyl, calcium carbonate (dosed in mg elemental calcium), camphor-menthol **AND** hydrOXYzine, docusate sodium, hydrALAZINE, labetalol, oxyCODONE, polyethylene glycol, sodium chloride flush, sorbitol, zolpidem    Exam:  Patient not examined today directly given COVID-19 + status, utilizing exam of the primary team and observations of RN's.      Home meds:  - aspirin 81  - amlodipine 10/ clonidine 0.2 bid/ labetalol 300 bid  - mycophenolate 500 bid/ tacrolimus 4mg  bid/ prednisone 5 bid/ bactrim mwf 1 tab SS  - sod bicarb 650 tid/ pantoprazole 40  - trazodone 25mg  hs prn  - ipratropium-  albuterol q 6 nebs prn    UA 11/8 > negative, prot 100   I/O 10.9L in and 5.2L UOP over 5.5 days, + 5L net        Assessment/ Plan: 1. AKI on CKD IV of renal transplant - baseline creat is 3.5- 4. Pt has been getting all his transplant meds (prograf, cellcept and prednisone) here,  tacrolimus level was in range at 8.8.  Creat 6 on admission here and down to 4.8 then w/ IVF's, then got IV lasix last 2 days and B/Cr up today. Will hold IV lasix for now. IVF"s gentle started at 50 cc/hr. Have d/w primary, consulting palliative and discussing w/ patient as to [place].  Will follow.  2. Hypokalemia - 2/2 diuresis, dc'ing lasix for now. Got po KDur this am, repeat this evening.  3. PNA - on zyvox and cefepime 4. Recent COVID assoc PNA - on October 5. HTN - on 3 BP meds here, BP's ok     Francisco Williamson 01/17/2019, 3:26 PM  Iron/TIBC/Ferritin/ %Sat    Component Value Date/Time   IRON 78 12/19/2018 1428   TIBC 162 (L) 12/19/2018 1428   FERRITIN 4,758 (H) 01/17/2019 0027   IRONPCTSAT 48 (H)  12/19/2018 1428   Recent Labs  Lab 01/11/19 0800  01/17/19 0027  NA 153*   < > 150*  K 4.8   < > 2.9*  CL 125*   < > 114*  CO2 16*   < > 21*  GLUCOSE 87   < > 90  BUN 87*   < > 100*  CREATININE 6.60*   < > 5.12*  CALCIUM 8.4*   < > 8.3*  PHOS  --    < > 4.0  ALBUMIN 2.1*   < > 2.5*  INR 1.5*  --   --    < > = values in this interval not displayed.   Recent Labs  Lab 01/17/19 0027  AST 43*  ALT 31  ALKPHOS 67  BILITOT 1.1  PROT 7.2   Recent Labs  Lab 01/17/19 0027  WBC 14.8*  HGB 9.5*  HCT 28.2*  PLT 148*

## 2019-01-18 LAB — TYPE AND SCREEN
ABO/RH(D): O POS
Antibody Screen: NEGATIVE
Unit division: 0

## 2019-01-18 LAB — COMPREHENSIVE METABOLIC PANEL
ALT: 41 U/L (ref 0–44)
AST: 45 U/L — ABNORMAL HIGH (ref 15–41)
Albumin: 2.4 g/dL — ABNORMAL LOW (ref 3.5–5.0)
Alkaline Phosphatase: 94 U/L (ref 38–126)
Anion gap: 16 — ABNORMAL HIGH (ref 5–15)
BUN: 128 mg/dL — ABNORMAL HIGH (ref 8–23)
CO2: 22 mmol/L (ref 22–32)
Calcium: 9 mg/dL (ref 8.9–10.3)
Chloride: 112 mmol/L — ABNORMAL HIGH (ref 98–111)
Creatinine, Ser: 5.79 mg/dL — ABNORMAL HIGH (ref 0.61–1.24)
GFR calc Af Amer: 10 mL/min — ABNORMAL LOW (ref >60)
GFR calc non Af Amer: 9 mL/min — ABNORMAL LOW (ref >60)
Glucose, Bld: 236 mg/dL — ABNORMAL HIGH (ref 70–99)
Potassium: 3.2 mmol/L — ABNORMAL LOW (ref 3.5–5.1)
Sodium: 150 mmol/L — ABNORMAL HIGH (ref 135–145)
Total Bilirubin: 0.6 mg/dL (ref 0.3–1.2)
Total Protein: 7.9 g/dL (ref 6.5–8.1)

## 2019-01-18 LAB — CBC WITH DIFFERENTIAL/PLATELET
Abs Immature Granulocytes: 0.1 10*3/uL — ABNORMAL HIGH (ref 0.00–0.07)
Basophils Absolute: 0 10*3/uL (ref 0.0–0.1)
Basophils Relative: 0 %
Eosinophils Absolute: 0 10*3/uL (ref 0.0–0.5)
Eosinophils Relative: 0 %
HCT: 27.7 % — ABNORMAL LOW (ref 39.0–52.0)
Hemoglobin: 9.3 g/dL — ABNORMAL LOW (ref 13.0–17.0)
Immature Granulocytes: 1 %
Lymphocytes Relative: 7 %
Lymphs Abs: 0.6 10*3/uL — ABNORMAL LOW (ref 0.7–4.0)
MCH: 28.6 pg (ref 26.0–34.0)
MCHC: 33.6 g/dL (ref 30.0–36.0)
MCV: 85.2 fL (ref 80.0–100.0)
Monocytes Absolute: 0.3 10*3/uL (ref 0.1–1.0)
Monocytes Relative: 4 %
Neutro Abs: 7.7 10*3/uL (ref 1.7–7.7)
Neutrophils Relative %: 88 %
Platelets: 146 10*3/uL — ABNORMAL LOW (ref 150–400)
RBC: 3.25 MIL/uL — ABNORMAL LOW (ref 4.22–5.81)
RDW: 16.9 % — ABNORMAL HIGH (ref 11.5–15.5)
WBC: 8.7 10*3/uL (ref 4.0–10.5)
nRBC: 0 % (ref 0.0–0.2)

## 2019-01-18 LAB — PROCALCITONIN: Procalcitonin: 21.96 ng/mL

## 2019-01-18 LAB — GLUCOSE, CAPILLARY
Glucose-Capillary: 291 mg/dL — ABNORMAL HIGH (ref 70–99)
Glucose-Capillary: 129 mg/dL — ABNORMAL HIGH (ref 70–99)
Glucose-Capillary: 145 mg/dL — ABNORMAL HIGH (ref 70–99)

## 2019-01-18 LAB — FERRITIN: Ferritin: 5143 ng/mL — ABNORMAL HIGH (ref 24–336)

## 2019-01-18 LAB — BPAM RBC
Blood Product Expiration Date: 202012192359
ISSUE DATE / TIME: 202011131105
Unit Type and Rh: 5100

## 2019-01-18 LAB — BPAM FFP
Blood Product Expiration Date: 202011151937
ISSUE DATE / TIME: 202011142016
Unit Type and Rh: 5100

## 2019-01-18 LAB — D-DIMER, QUANTITATIVE: D-Dimer, Quant: 4.6 ug/mL-FEU — ABNORMAL HIGH (ref 0.00–0.50)

## 2019-01-18 LAB — C-REACTIVE PROTEIN: CRP: 28.4 mg/dL — ABNORMAL HIGH (ref <1.0)

## 2019-01-18 LAB — PREPARE FRESH FROZEN PLASMA

## 2019-01-18 LAB — MAGNESIUM: Magnesium: 2.2 mg/dL (ref 1.7–2.4)

## 2019-01-18 LAB — PHOSPHORUS: Phosphorus: 4.5 mg/dL (ref 2.5–4.6)

## 2019-01-18 MED ORDER — DOCUSATE SODIUM 100 MG PO CAPS: 100.0000 mg | ORAL_CAPSULE | Freq: Every day | ORAL | Status: DC | PRN

## 2019-01-18 MED ORDER — POTASSIUM CHLORIDE 10 MEQ/100ML IV SOLN: 10.0000 meq | INTRAVENOUS | Status: DC

## 2019-01-18 MED ORDER — LOPERAMIDE HCL 2 MG PO CAPS
4.0000 mg | ORAL_CAPSULE | ORAL | Status: DC | PRN
  Administered 2019-01-22: 4 mg via ORAL
  Filled 2019-01-18 (×2): qty 2

## 2019-01-18 MED ORDER — POTASSIUM CHLORIDE 10 MEQ/100ML IV SOLN
10.0000 meq | INTRAVENOUS | Status: AC
  Administered 2019-01-18 (×5): 10 meq via INTRAVENOUS
  Filled 2019-01-18 (×5): qty 100

## 2019-01-18 MED ORDER — DEXTROSE 5 % IV SOLN
INTRAVENOUS | Status: DC
  Administered 2019-01-18 – 2019-01-21 (×6): via INTRAVENOUS

## 2019-01-18 NOTE — Progress Notes (Signed)
Attempted to update Francisco Williamson.  She was unavailable, so left VM for her to call back

## 2019-01-18 NOTE — Progress Notes (Signed)
Updated [place] on POC

## 2019-01-18 NOTE — Progress Notes (Signed)
PROGRESS NOTE    Francisco Williamson  [id number] DOB: 10/05/1945 DOA: 01/11/2019 PCP: Default, Provider, MD   Brief Narrative:  73 y.o.BM PMHx Essential HTN and CKD stage V (s/p renal transplantation), deconditioning,  Presenting with AMS. He was admitted to [facility] from 10/28-11/3 for COVID-19-associated PNA with acute respiratory failure with hypoxia. He completed a course of Remdesivir and steroids, and he was discharged home on 11/3, patient was brought back to ED, given failure to thrive, dehydration and altered mental status, he was noted to have significant dehydration with hypernatremia and renal failure, he was transferred to [facility] for further management.    Subjective: 11/15 afebrile last 24 hours    Assessment & Plan:   Principal Problem:   Acute renal failure superimposed on chronic kidney disease ([place]) Active Problems:   CKD stage 5 secondary to hypertension ([place])   Pneumonia due to COVID-19 virus   Essential hypertension   History of renal transplant   Severe sepsis (HCC)   Sepsis secondary to UTI ([place])   HCAP (healthcare-associated pneumonia)   Bilateral pleural effusion   Acute hypernatremia      Sepsis due to staph epidermidis UTI -Afebrile, negative leukocytosis.  Will receive 2 doses of vancomycin but given his renal function is = 7 doses. -Hold all nephrotoxic medication -Bactrim prophylaxis for his renal transplant -CellCept 500 mg  BID for renal transplant  -Tacrolimus 4 mg  BID renal transplant -11/11 Tacrolimus level = 8.5 -11/11 CellCept level pending  Acute on CKD stage V/RENAL transplant patient (Cr = 3.5) Recent Labs  Lab 01/14/19 0100 01/15/19 0000 01/16/19 0020 01/17/19 0027 01/18/19 0215  CREATININE 4.84* 4.83* 4.89* 5.12* 5.79*  -See sepsis UTI -Strict in and out +6.5 L -Daily weight Filed Weights   01/17/19 0426 01/17/19 0430 01/18/19 0500  Weight: 47.3 kg 47.3 kg 47 kg  -11/12 Albumin 50 g + Lasix IV 60 mg -11/13  Albumin 50 g, followed by Lasix IV 120 mg TID (discussed case with nephrology Dr. Jonnie Finner); will see patient today.  Recommendations are for conservative care no CRRT at this time.  -11/14 discussed case with Dr. Jonnie Finner nephrology he would like for me to hold Lasix until kidney function improves.  We will continue to monitor kidney function closely  HCAP/Covid 19 pneumonia  -On previous admission patient completed treatment of remdesivir+ steroids -Not fully convinced patient has HCAP, however patient has elevated procalcitonin level Procalcitonin 11/8 (42.95), 11/11 (26.99), complete 7-day course cefepime -11/13 received 1 dose of vancomycin COVID-19 Labs  Recent Labs    01/16/19 0020 01/17/19 0027 01/18/19 0215  DDIMER 1.86* 5.77* 4.60*  FERRITIN 4,335* 4,758* 5,143*  CRP 14.4* 22.2* 28.4*    Lab Results  Component Value Date   SARSCOV2NAA POSITIVE (A) 12/31/2018  -11/14 patient has worsened with treatment for HCAP believe this is a viral pneumonia (recurrence of Covid/infection different strain Covid). -11/14 Decadron 6 mg -11/14 Remdesivir per pharmacy protocol -11/14 Covid convalescent plasma  Bilateral pleural effusion/Hypoalbuminemia -See acute on CKD stage V  Essential HTN -Amlodipine 10 mg daily -Clonidine 0.2 mg BID -Hydralazine PRN -Labetalol PRN -11/13 increase Metoprolol 50 mg BID  GI bleed Recent Labs  Lab 01/15/19 1310 01/16/19 0020 01/16/19 0535 01/17/19 0027 01/18/19 0215  HGB 7.6* 7.1* 8.8* 9.5* 9.3*  -Per EMR overnight 1910: Patient had small amount of blood with his stool, will hold his heparin subcu, will repeat CBC, will get type and screen , and start protonix 40 mg IV BID. -11/13 occult  blood pending -11/13 transfuse 1 unit PRBC -Transfuse for hemoglobin<7   Hypernatremia -11/14 secondary to dehydration; iatrogenic patient on large dose of Lasix.  D/Ced Lasix. -11/15 D5W 76ml/hr -See renal failure  Hypokalemia -Potassium goal l>4  -1/15 discontinue continue 0.45% saline + KCl 40 mEq at 67ml/hr -11/15 potassium IV 50 mEq -See renal failure   Hypomagnesmia -Magnesium goal> 2  Hyperglycemia -Sensitive SSI  Diarrhea -Patient unaware of the BMs -Would also account for his hypokalemia. -Imodium   Failure to thrive -Patient unable to care for himself at home.  Discharged on 11/3 and readmitted on 11/8.  Patient now with worsening acute on CKD stage V.  Clear that if patient goes home instead of to a SNF is at high risk for complete renal failure of his transplanted kidney.  -11/14 palliative care; patient with multiple medical problems was discharged a week ago for Covid pneumonia return within a week with acute respiratory failure with hypoxia and increasing renal failure (renal transplant patient).  Appears patient may not survive this hospitalization discuss hospice   DVT prophylaxis: SCD Code Status: DNR Family Communication: 11/15 left message with with Lyanne Co (niece) phone that I had attempted to call her and update her concerning her uncle  disposition Plan: SNF?   Consultants:  Nephrology    Procedures/Significant Events:  11/8 PCXR; bilateral patchy areas of consolidation may represent multifocal pneumonia vs atypical/viral infectious process. 11/12 PCXR-worsening multifocal airspace opacities.  -New small to moderate-sized bilateral pleural effusions. 11/13 transfuse 1 unit PRBC 11/14 PCXR;-no significant interval change in AP portable examination with diffuse interstitial and heterogeneous airspace opacity.  -Findings remain consistent with multifocal infection and/or edema   I have personally reviewed and interpreted all radiology studies and my findings are as above.  VENTILATOR SETTINGS: HFNC Flow; 7 L/min SPO2; 100%      Cultures 11/13 blood pending 11/13 urine pending     Antimicrobials: Anti-infectives (From admission, onward)   Start     Stop   01/17/19 1300   remdesivir 100 mg in sodium chloride 0.9 % 250 mL IVPB     01/22/19 0959   01/16/19 1800  vancomycin (VANCOCIN) 500 mg in sodium chloride 0.9 % 100 mL IVPB     01/16/19 2323   01/13/19 1900  vancomycin (VANCOCIN) IVPB 1000 mg/200 mL premix     01/13/19 2200   01/12/19 1800  ceFEPIme (MAXIPIME) 1 g in sodium chloride 0.9 % 100 mL IVPB     01/17/19 2359   01/12/19 1600  sulfamethoxazole-trimethoprim (BACTRIM) 400-80 MG per tablet 1 tablet    Note to Pharmacy: Take one tablet by mouth on Monday, Wednesday and fridays.         01/11/19 0830  vancomycin (VANCOCIN) IVPB 1000 mg/200 mL premix  Status:  Discontinued     01/11/19 0901   01/11/19 0830  ceFEPIme (MAXIPIME) 2 g in sodium chloride 0.9 % 100 mL IVPB     01/11/19 0952       Devices    LINES / TUBES:      Continuous Infusions: . sodium chloride    . remdesivir 100 mg in NS 250 mL 100 mg (01/17/19 1331)  . sodium chloride 0.45 % with kcl 50 mL/hr at 01/15/19 1700     Objective: Vitals:   01/18/19 0500 01/18/19 0555 01/18/19 0700 01/18/19 0800  BP:  (!) 168/65 (!) 159/64   Pulse:  75 64   Resp:  (!) 26 16   Temp:  Marland Kitchen)  96 F (35.6 C) (!) 97.4 F (36.3 C)   TempSrc:  Axillary Oral   SpO2:  100% 100% 99%  Weight: 47 kg     Height:        Intake/Output Summary (Last 24 hours) at 01/18/2019 0940 Last data filed at 01/18/2019 0555 Gross per 24 hour  Intake 1425 ml  Output 900 ml  Net 525 ml   Filed Weights   01/17/19 0426 01/17/19 0430 01/18/19 0500  Weight: 47.3 kg 47.3 kg 47 kg    Physical Exam:  General: A/O x4, positive acute respiratory distress, extremely weak, cachectic, refuses to eat or drink Eyes: negative scleral hemorrhage, negative anisocoria, negative icterus ENT: Negative Runny nose, negative gingival bleeding, Neck:  Negative scars, masses, torticollis, lymphadenopathy, JVD Lungs: Tachypneic, clear to auscultation bilaterally without wheezes or crackles Cardiovascular: Regular rate and  rhythm without murmur gallop or rub normal S1 and S2 Abdomen: negative abdominal pain, nondistended, positive soft, bowel sounds, no rebound, no ascites, no appreciable mass Extremities: No significant cyanosis, clubbing, or edema bilateral lower extremities Skin: Negative rashes, lesions, ulcers Psychiatric:  Negative depression, negative anxiety, negative fatigue, negative mania  Central nervous system:  Cranial nerves II through XII intact, tongue/uvula midline, all extremities muscle strength 5/5, sensation intact throughout,negative dysarthria, negative expressive aphasia, negative receptive aphasia. .     Data Reviewed: Care during the described time interval was provided by me .  I have reviewed this patient's available data, including medical history, events of note, physical examination, and all test results as part of my evaluation.   CBC: Recent Labs  Lab 01/14/19 0100  01/15/19 0000  01/15/19 1310 01/16/19 0020 01/16/19 0535 01/17/19 0027 01/18/19 0215  WBC 8.9  --  14.5*  --   --  14.8*  --  14.8* 8.7  NEUTROABS 7.7  --  13.4*  --   --  13.5*  --  13.6* 7.7  HGB 7.2*   < > 7.4*   < > 7.6* 7.1* 8.8* 9.5* 9.3*  HCT 22.1*   < > 21.8*   < > 23.1* 21.5* 26.0* 28.2* 27.7*  MCV 87.4  --  85.5  --   --  87.8  --  84.7 85.2  PLT 120*  --  123*  --   --  132*  --  148* 146*   < > = values in this interval not displayed.   Basic Metabolic Panel: Recent Labs  Lab 01/14/19 0100 01/15/19 0000 01/15/19 1540 01/16/19 0020 01/16/19 0535 01/17/19 0027 01/18/19 0215  NA 137 138  --  144 147* 150* 150*  K 3.6 3.3* 4.4 4.2 3.4* 2.9* 3.2*  CL 104 105  --  112*  --  114* 112*  CO2 22 21*  --  19*  --  21* 22  GLUCOSE 389* 148*  --  147*  --  90 236*  BUN 77* 86*  --  93*  --  100* 128*  CREATININE 4.84* 4.83*  --  4.89*  --  5.12* 5.79*  CALCIUM 7.4* 7.4*  --  8.0*  --  8.3* 9.0  MG 2.2 2.0 1.9 1.9  --  1.8 2.2  PHOS 3.7 2.1*  --  3.9  --  4.0 4.5   GFR: Estimated Creatinine  Clearance: 7.8 mL/min (A) (by C-G formula based on SCr of 5.79 mg/dL (H)). Liver Function Tests: Recent Labs  Lab 01/14/19 0100 01/15/19 0000 01/16/19 0020 01/17/19 0027 01/18/19 0215  AST 38 60* 53*  43* 45*  ALT 20 38 37 31 41  ALKPHOS 47 66 65 67 94  BILITOT 0.8 0.6 1.2 1.1 0.6  PROT 6.0* 6.4* 7.0 7.2 7.9  ALBUMIN 1.6* 1.6* 2.5* 2.5* 2.4*   No results for input(s): LIPASE, AMYLASE in the last 168 hours. No results for input(s): AMMONIA in the last 168 hours. Coagulation Profile: No results for input(s): INR, PROTIME in the last 168 hours. Cardiac Enzymes: No results for input(s): CKTOTAL, CKMB, CKMBINDEX, TROPONINI in the last 168 hours. BNP (last 3 results) No results for input(s): PROBNP in the last 8760 hours. HbA1C: No results for input(s): HGBA1C in the last 72 hours. CBG: Recent Labs  Lab 01/16/19 0048 01/16/19 0410 01/16/19 0740 01/16/19 1745 01/18/19 0211  GLUCAP 157* 114* 137* 177* 291*   Lipid Profile: No results for input(s): CHOL, HDL, LDLCALC, TRIG, CHOLHDL, LDLDIRECT in the last 72 hours. Thyroid Function Tests: No results for input(s): TSH, T4TOTAL, FREET4, T3FREE, THYROIDAB in the last 72 hours. Anemia Panel: Recent Labs    01/17/19 0027 01/18/19 0215  FERRITIN 4,758* 5,143*   Urine analysis:    Component Value Date/Time   COLORURINE AMBER (A) 01/11/2019 1455   APPEARANCEUR CLOUDY (A) 01/11/2019 1455   LABSPEC 1.013 01/11/2019 1455   PHURINE 5.0 01/11/2019 1455   GLUCOSEU 50 (A) 01/11/2019 1455   HGBUR MODERATE (A) 01/11/2019 1455   BILIRUBINUR NEGATIVE 01/11/2019 1455   KETONESUR NEGATIVE 01/11/2019 1455   PROTEINUR 100 (A) 01/11/2019 1455   UROBILINOGEN 1.0 12/20/2014 1252   NITRITE NEGATIVE 01/11/2019 1455   LEUKOCYTESUR NEGATIVE 01/11/2019 1455   Sepsis Labs: @LABRCNTIP (procalcitonin:4,lacticidven:4)  ) Recent Results (from the past 240 hour(s))  Blood Culture (routine x 2)     Status: None   Collection Time: 01/11/19  8:00 AM    Specimen: BLOOD LEFT FOREARM  Result Value Ref Range Status   Specimen Description BLOOD LEFT FOREARM  Final   Special Requests   Final    BOTTLES DRAWN AEROBIC AND ANAEROBIC Blood Culture adequate volume   Culture   Final    NO GROWTH 5 DAYS Performed at [facility], [place] [address] [place], [place] [address]    Report Status 01/16/2019 FINAL  Final  Blood Culture (routine x 2)     Status: None   Collection Time: 01/11/19  8:15 AM   Specimen: BLOOD RIGHT HAND  Result Value Ref Range Status   Specimen Description BLOOD RIGHT HAND  Final   Special Requests   Final    BOTTLES DRAWN AEROBIC ONLY Blood Culture adequate volume   Culture   Final    NO GROWTH 5 DAYS Performed at [facility], [place] [address], [place], [place] [address]    Report Status 01/16/2019 FINAL  Final  Culture, Urine     Status: Abnormal   Collection Time: 01/11/19  2:55 PM   Specimen: Urine, Clean Catch  Result Value Ref Range Status   Specimen Description URINE, CLEAN CATCH  Final   Special Requests   Final    NONE Performed at [facility], [place] [address], [place], [place] [address]    Culture 40,000 COLONIES/mL STAPHYLOCOCCUS EPIDERMIDIS (A)  Final   Report Status 01/13/2019 FINAL  Final   Organism ID, Bacteria STAPHYLOCOCCUS EPIDERMIDIS (A)  Final      Susceptibility   Staphylococcus epidermidis - MIC*    CIPROFLOXACIN >=8 RESISTANT Resistant     GENTAMICIN <=0.5 SENSITIVE Sensitive     NITROFURANTOIN <=  16 SENSITIVE Sensitive     OXACILLIN >=4 RESISTANT Resistant     TETRACYCLINE 2 SENSITIVE Sensitive     VANCOMYCIN 1 SENSITIVE Sensitive     TRIMETH/SULFA 160 RESISTANT Resistant     CLINDAMYCIN >=8 RESISTANT Resistant     RIFAMPIN <=0.5 SENSITIVE Sensitive     Inducible Clindamycin NEGATIVE Sensitive     * 40,000 COLONIES/mL STAPHYLOCOCCUS EPIDERMIDIS  Culture, blood (routine x 2)     Status: None (Preliminary result)   Collection Time: 01/16/19  7:45 AM   Specimen: BLOOD   Result Value Ref Range Status   Specimen Description   Final    BLOOD RIGHT ANTECUBITAL Performed at [facility] [address], [place], [place] [address]    Special Requests   Final    BOTTLES DRAWN AEROBIC ONLY Blood Culture adequate volume Performed at [place] [address], [place], [place] [address]    Culture   Final    NO GROWTH 1 DAY Performed at [facility], [place] [address] [place], [place] [address]    Report Status PENDING  Incomplete  Culture, blood (routine x 2)     Status: None (Preliminary result)   Collection Time: 01/16/19  7:50 AM   Specimen: BLOOD  Result Value Ref Range Status   Specimen Description   Final    BLOOD RIGHT HAND Performed at [place] [address], [place], [place] [address]    Special Requests   Final    BOTTLES DRAWN AEROBIC ONLY Blood Culture adequate volume Performed at [place] [address] [place], [place] [address]    Culture   Final    NO GROWTH 1 DAY Performed at [facility], [place] [address], [place], [place] [address]    Report Status PENDING  Incomplete         Radiology Studies: Dg Chest Port 1 View  Result Date: 01/17/2019 CLINICAL DATA:  Shortness of breath EXAM: PORTABLE CHEST 1 VIEW COMPARISON:  01/16/2019 FINDINGS: No significant interval change in AP portable examination with diffuse interstitial and heterogeneous airspace opacity. Mild cardiomegaly. IMPRESSION: No significant interval change in AP portable examination with diffuse interstitial and heterogeneous airspace opacity. Findings remain consistent with multifocal infection and/or edema. Electronically Signed   By: Eddie Candle M.D.   On: 01/17/2019 12:41        Scheduled Meds: . amLODipine  10 mg Oral Daily  . aspirin EC  81 mg Oral Daily  . cloNIDine  0.2 mg Oral BID  . dexamethasone (DECADRON) injection  6 mg Intravenous Q24H  . docusate  sodium  100 mg Oral BID  . feeding supplement (ENSURE ENLIVE)  237 mL Oral TID BM  . insulin aspart  0-9 Units Subcutaneous Q4H  . Ipratropium-Albuterol  1 puff Inhalation Q6H  . mouth rinse  15 mL Mouth Rinse BID  . metoprolol tartrate  50 mg Oral BID  . mycophenolate  500 mg Oral BID  . pantoprazole (PROTONIX) IV  40 mg Intravenous Q12H  . sodium chloride flush  3 mL Intravenous Q12H  . sulfamethoxazole-trimethoprim  1 tablet Oral Q M,W,F  . tacrolimus  4 mg Oral BID   Continuous Infusions: . sodium chloride    . remdesivir 100 mg in NS 250 mL 100 mg (01/17/19 1331)  . sodium chloride 0.45 % with kcl 50 mL/hr at 01/15/19 1700     LOS: 7 days   The patient  is critically ill with multiple organ systems failure and requires high complexity decision making for assessment and support, frequent evaluation and titration of therapies, application of advanced monitoring technologies and extensive interpretation of multiple databases. Critical Care Time devoted to patient care services described in this note  Time spent: 40 minutes     , Geraldo Docker, MD [facility] Pager [phone]  If 7PM-7AM, please contact night-coverage [email or web address] Password [email or web address] 01/18/2019, 9:40 AM

## 2019-01-19 ENCOUNTER — Encounter (HOSPITAL_COMMUNITY): Payer: Self-pay | Admitting: Primary Care

## 2019-01-19 DIAGNOSIS — Z7189 Other specified counseling: Secondary | ICD-10-CM

## 2019-01-19 DIAGNOSIS — R197 Diarrhea, unspecified: Secondary | ICD-10-CM

## 2019-01-19 DIAGNOSIS — Z515 Encounter for palliative care: Secondary | ICD-10-CM

## 2019-01-19 LAB — COMPREHENSIVE METABOLIC PANEL
ALT: 31 U/L (ref 0–44)
AST: 25 U/L (ref 15–41)
Albumin: 2.6 g/dL — ABNORMAL LOW (ref 3.5–5.0)
Alkaline Phosphatase: 89 U/L (ref 38–126)
Anion gap: 12 (ref 5–15)
BUN: 142 mg/dL — ABNORMAL HIGH (ref 8–23)
CO2: 21 mmol/L — ABNORMAL LOW (ref 22–32)
Calcium: 8.9 mg/dL (ref 8.9–10.3)
Chloride: 120 mmol/L — ABNORMAL HIGH (ref 98–111)
Creatinine, Ser: 5.78 mg/dL — ABNORMAL HIGH (ref 0.61–1.24)
GFR calc Af Amer: 10 mL/min — ABNORMAL LOW (ref >60)
GFR calc non Af Amer: 9 mL/min — ABNORMAL LOW (ref >60)
Glucose, Bld: 206 mg/dL — ABNORMAL HIGH (ref 70–99)
Potassium: 4.2 mmol/L (ref 3.5–5.1)
Sodium: 153 mmol/L — ABNORMAL HIGH (ref 135–145)
Total Bilirubin: 0.7 mg/dL (ref 0.3–1.2)
Total Protein: 8.1 g/dL (ref 6.5–8.1)

## 2019-01-19 LAB — CBC WITH DIFFERENTIAL/PLATELET
Abs Immature Granulocytes: 0.08 10*3/uL — ABNORMAL HIGH (ref 0.00–0.07)
Basophils Absolute: 0 10*3/uL (ref 0.0–0.1)
Basophils Relative: 0 %
Eosinophils Absolute: 0 10*3/uL (ref 0.0–0.5)
Eosinophils Relative: 0 %
HCT: 30.5 % — ABNORMAL LOW (ref 39.0–52.0)
Hemoglobin: 10.3 g/dL — ABNORMAL LOW (ref 13.0–17.0)
Immature Granulocytes: 1 %
Lymphocytes Relative: 10 %
Lymphs Abs: 0.8 10*3/uL (ref 0.7–4.0)
MCH: 28.5 pg (ref 26.0–34.0)
MCHC: 33.8 g/dL (ref 30.0–36.0)
MCV: 84.3 fL (ref 80.0–100.0)
Monocytes Absolute: 0.3 10*3/uL (ref 0.1–1.0)
Monocytes Relative: 3 %
Neutro Abs: 7.6 10*3/uL (ref 1.7–7.7)
Neutrophils Relative %: 86 %
Platelets: 154 10*3/uL (ref 150–400)
RBC: 3.62 MIL/uL — ABNORMAL LOW (ref 4.22–5.81)
RDW: 16.7 % — ABNORMAL HIGH (ref 11.5–15.5)
WBC: 8.8 10*3/uL (ref 4.0–10.5)
nRBC: 0 % (ref 0.0–0.2)

## 2019-01-19 LAB — GLUCOSE, CAPILLARY
Glucose-Capillary: 145 mg/dL — ABNORMAL HIGH (ref 70–99)
Glucose-Capillary: 162 mg/dL — ABNORMAL HIGH (ref 70–99)
Glucose-Capillary: 197 mg/dL — ABNORMAL HIGH (ref 70–99)
Glucose-Capillary: 228 mg/dL — ABNORMAL HIGH (ref 70–99)
Glucose-Capillary: 243 mg/dL — ABNORMAL HIGH (ref 70–99)
Glucose-Capillary: 77 mg/dL (ref 70–99)

## 2019-01-19 LAB — FERRITIN: Ferritin: 5344 ng/mL — ABNORMAL HIGH (ref 24–336)

## 2019-01-19 LAB — D-DIMER, QUANTITATIVE: D-Dimer, Quant: 4.37 ug/mL-FEU — ABNORMAL HIGH (ref 0.00–0.50)

## 2019-01-19 LAB — C-REACTIVE PROTEIN: CRP: 18.2 mg/dL — ABNORMAL HIGH (ref <1.0)

## 2019-01-19 LAB — MAGNESIUM: Magnesium: 2.2 mg/dL (ref 1.7–2.4)

## 2019-01-19 LAB — PHOSPHORUS: Phosphorus: 5.4 mg/dL — ABNORMAL HIGH (ref 2.5–4.6)

## 2019-01-19 MED ORDER — NEPRO/CARBSTEADY PO LIQD
1000.0000 mL | ORAL | Status: DC
  Filled 2019-01-19 (×2): qty 1000

## 2019-01-19 MED ORDER — FREE WATER
300.0000 mL | Status: DC
  Administered 2019-01-20 – 2019-01-21 (×5): 300 mL

## 2019-01-19 MED ORDER — HEPARIN SODIUM (PORCINE) 5000 UNIT/ML IJ SOLN
5000.0000 [IU] | Freq: Three times a day (TID) | INTRAMUSCULAR | Status: DC
  Administered 2019-01-20 – 2019-01-22 (×7): 5000 [IU] via SUBCUTANEOUS
  Filled 2019-01-19 (×6): qty 1

## 2019-01-19 NOTE — Progress Notes (Signed)
[facility] Progress Note  Subjective: date taken from chart, 1500 cc UOP yesterday. 1.4 liters in.  B/CR cont to rise, 128/ 5.79 today.   Vitals:   01/18/19 2007 01/19/19 0055 01/19/19 0407 01/19/19 0415  BP:  (!) 162/65 (!) 165/79   Pulse:  84 81   Resp:  (!) 28 (!) 30   Temp: 98.5 F (36.9 C) (!) 96.4 F (35.8 C) (!) 96.7 F (35.9 C)   TempSrc: Axillary Axillary Axillary   SpO2:  97% 99%   Weight:    45.6 kg  Height:        Inpatient medications: . amLODipine  10 mg Oral Daily  . aspirin EC  81 mg Oral Daily  . cloNIDine  0.2 mg Oral BID  . dexamethasone (DECADRON) injection  6 mg Intravenous Q24H  . feeding supplement (ENSURE ENLIVE)  237 mL Oral TID BM  . insulin aspart  0-9 Units Subcutaneous Q4H  . Ipratropium-Albuterol  1 puff Inhalation Q6H  . mouth rinse  15 mL Mouth Rinse BID  . metoprolol tartrate  50 mg Oral BID  . mycophenolate  500 mg Oral BID  . pantoprazole (PROTONIX) IV  40 mg Intravenous Q12H  . sodium chloride flush  3 mL Intravenous Q12H  . sulfamethoxazole-trimethoprim  1 tablet Oral Q M,W,F  . tacrolimus  4 mg Oral BID   . sodium chloride    . dextrose Stopped (01/18/19 1205)  . remdesivir 100 mg in NS 250 mL Stopped (01/18/19 1012)   sodium chloride, acetaminophen, bisacodyl, calcium carbonate (dosed in mg elemental calcium), camphor-menthol **AND** hydrOXYzine, docusate sodium, docusate sodium, hydrALAZINE, labetalol, loperamide, oxyCODONE, polyethylene glycol, sodium chloride flush, sorbitol, zolpidem    Exam:  Patient not examined today directly given COVID-19 + status, utilizing exam of the primary team and observations of RN's.      Home meds:  - aspirin 81  - amlodipine 10/ clonidine 0.2 bid/ labetalol 300 bid  - mycophenolate 500 bid/ tacrolimus 4mg  bid/ prednisone 5 bid/ bactrim mwf 1 tab SS  - sod bicarb 650 tid/ pantoprazole 40  - trazodone 25mg  hs prn  - ipratropium- albuterol q 6 nebs prn    UA 11/8 >  negative, prot 100   I/O 10.9L in and 5.2L UOP over 5.5 days, + 5L net        Assessment/ Plan: 1. AKI on CKD IV of renal transplant - baseline creat is 3.5- 4. Pt has a functioning fistula. He had been getting all his transplant meds (prograf, cellcept and prednisone) here,  tacrolimus level was in range at 8.8.  Creat 6 on admission here and down to 4.8 w/ IVF's, then got IV lasix and creat ^'d so lasix held and gentle IVF's started. Creat continues to rise however to 5.7 today w/ BUN 128. UOP good.  Have d/w primary MD who is talking w/ patient and family regarding [place].  Pt is DNR.  If renal function continues to decline and aggressive care is desired, pt will likely need CRRT within 24-48hrs. If non-aggressive care is desired and renal fxn continues to decline, then would need transition to hospice care. Will follow.  2. Hypokalemia - 2/2 diuresis, a little better today 3. PNA - on zyvox and cefepime 4. Recent COVID assoc PNA - on October 5. HTN - on 3 BP meds here, BP's ok     Rob Audine Mangione 01/18/2019, 4:45 PM  Iron/TIBC/Ferritin/ %Sat    Component Value Date/Time   IRON 78  12/19/2018 1428   TIBC 162 (L) 12/19/2018 1428   FERRITIN 5,143 (H) 01/18/2019 0215   IRONPCTSAT 48 (H) 12/19/2018 1428   Recent Labs  Lab 01/18/19 0215  NA 150*  K 3.2*  CL 112*  CO2 22  GLUCOSE 236*  BUN 128*  CREATININE 5.79*  CALCIUM 9.0  PHOS 4.5  ALBUMIN 2.4*   Recent Labs  Lab 01/18/19 0215  AST 45*  ALT 41  ALKPHOS 94  BILITOT 0.6  PROT 7.9   Recent Labs  Lab 01/18/19 0215  WBC 8.7  HGB 9.3*  HCT 27.7*  PLT 146*

## 2019-01-19 NOTE — Progress Notes (Addendum)
PROGRESS NOTE    Francisco Williamson  [id number] DOB: 20-Nov-1945 DOA: 01/11/2019 PCP: Default, Provider, MD   Brief Narrative:  73 y.o.BM PMHx Essential HTN and CKD stage V (s/p renal transplantation), deconditioning,  Presenting with AMS. He was admitted to [facility] from 10/28-11/3 for COVID-19-associated PNA with acute respiratory failure with hypoxia. He completed a course of Remdesivir and steroids, and he was discharged home on 11/3, patient was brought back to ED, given failure to thrive, dehydration and altered mental status, he was noted to have significant dehydration with hypernatremia and renal failure, he was transferred to [facility] for further management.    Subjective: 11/16 afebrile last 24 hours patient extremely weak now only able to answer yes/no questions.  Patient did agree to coreTrack tube   Assessment & Plan:   Principal Problem:   Acute renal failure superimposed on chronic kidney disease ([place]) Active Problems:   CKD stage 5 secondary to hypertension ([place])   Pneumonia due to COVID-19 virus   Essential hypertension   History of renal transplant   Severe sepsis (H[facility])   Sepsis secondary to UTI ([place])   HCAP (healthcare-associated pneumonia)   Bilateral pleural effusion   Acute hypernatremia      Sepsis due to staph epidermidis UTI -Afebrile, negative leukocytosis.  Received 2 doses of vancomycin but given his renal function is = 7 doses. -Hold all nephrotoxic medication -Bactrim prophylaxis for his renal transplant -CellCept 500 mg  BID for renal transplant  -Tacrolimus 4 mg  BID renal transplant -11/11 Tacrolimus level = 8.5 -11/11 CellCept level pending  Acute on CKD stage V/RENAL transplant patient (Cr = 3.5) Recent Labs  Lab 01/15/19 0000 01/16/19 0020 01/17/19 0027 01/18/19 0215 01/19/19 0450  CREATININE 4.83* 4.89* 5.12* 5.79* 5.78*  -See sepsis UTI -Strict in and out +5.8 L -Daily weight Filed Weights   01/17/19 0430 01/18/19 0500  01/19/19 0415  Weight: 47.3 kg 47 kg 45.6 kg  -11/12 Albumin 50 g + Lasix IV 60 mg -11/13 Albumin 50 g, followed by Lasix IV 120 mg TID (discussed case with nephrology Dr. Jonnie Finner); will see patient today.  Recommendations are for conservative care no CRRT at this time.  -11/14 discussed case with Dr. Jonnie Finner nephrology he would like for me to hold Lasix until kidney function improves.  We will continue to monitor kidney function closely -11/16 discussed case with Dr. Corliss Parish nephrology and she concurs patient needs CRRT/HD.  We both would prefer not to place another line in a patient who has been hospitalized twice within 3 weeks for infection.  Therefore since patient has functional AV fistula will transfer patient to [facility] for CRRT/HD for uremia due to acute on chronic renal failure.Marland Kitchen   HCAP/Covid 19 pneumonia  -On previous admission patient completed treatment of remdesivir+ steroids -Not fully convinced patient has HCAP, however patient has elevated procalcitonin level Procalcitonin 11/8 (42.95), 11/11 (26.99), complete 7-day course cefepime -11/13 received 1 dose of vancomycin COVID-19 Labs  Recent Labs    01/17/19 0027 01/18/19 0215 01/19/19 0450  DDIMER 5.77* 4.60* 4.37*  FERRITIN 4,758* 5,143*  --   CRP 22.2* 28.4*  --     Lab Results  Component Value Date   SARSCOV2NAA POSITIVE (A) 12/31/2018  -11/14 patient has worsened with treatment for HCAP believe this is a viral pneumonia (recurrence of Covid/infection different strain Covid). -11/14 Decadron 6 mg -11/14 Remdesivir per pharmacy protocol -11/14 Covid convalescent plasma  Bilateral pleural effusion/Hypoalbuminemia -See acute on CKD stage V  Essential HTN -Amlodipine 10 mg daily -Clonidine 0.2 mg BID -Hydralazine PRN -Labetalol PRN -11/13 increase Metoprolol 50 mg BID  GI bleed Recent Labs  Lab 01/16/19 0020 01/16/19 0535 01/17/19 0027 01/18/19 0215 01/19/19 0450  HGB 7.1* 8.8* 9.5*  9.3* 10.3*  -Per EMR overnight 1910: Patient had small amount of blood with his stool, will hold his heparin subcu, will repeat CBC, will get type and screen , and start protonix 40 mg IV BID. -11/13 occult blood pending -11/13 transfuse 1 unit PRBC -11/17 subcu heparin per pharmacy -Transfuse for hemoglobin<7   Hypernatremia -11/14 secondary to dehydration; iatrogenic patient on large dose of Lasix.  D/Ced Lasix. -11/16 increase D5W 12ml/hr -See renal failure  Hypokalemia -Potassium goal l>4 -See renal failure   Hypomagnesmia -Magnesium goal> 2  Hyperglycemia -Sensitive SSI  Diarrhea -Patient unaware of the BMs -Would also account for his hypokalemia. -Imodium -11/16 discontinue Sorbitol, bisacodyl, MiraLAX   Failure to thrive -Patient unable to care for himself at home.  Discharged on 11/3 and readmitted on 11/8.  Patient now with worsening acute on CKD stage V.  Clear that if patient goes home instead of to a SNF is at high risk for complete renal failure of his transplanted kidney. -11/16 requested placement of CorTrak tube  -11/14 palliative care; patient with multiple medical problems was discharged a week ago for Covid pneumonia return within a week with acute respiratory failure with hypoxia and increasing renal failure (renal transplant patient).  Appears patient may not survive this hospitalization discuss hospice   DVT prophylaxis: SCD (secondary to GI bleed) Code Status: DNR Family Communication: 11/16 left message spoke with Lyanne Co (niece) counseled her on plan of care answered all questions   disposition Plan: SNF?   Consultants:  Nephrology    Procedures/Significant Events:  11/8 PCXR; bilateral patchy areas of consolidation may represent multifocal pneumonia vs atypical/viral infectious process. 11/12 PCXR-worsening multifocal airspace opacities.  -New small to moderate-sized bilateral pleural effusions. 11/13 transfuse 1 unit PRBC 11/14  PCXR;-no significant interval change in AP portable examination with diffuse interstitial and heterogeneous airspace opacity.  -Findings remain consistent with multifocal infection and/or edema   I have personally reviewed and interpreted all radiology studies and my findings are as above.  VENTILATOR SETTINGS: Room air SPO2= 100%     Cultures 11/13 blood pending 11/13 urine pending     Antimicrobials: Anti-infectives (From admission, onward)   Start     Stop   01/17/19 1300  remdesivir 100 mg in sodium chloride 0.9 % 250 mL IVPB     01/22/19 0959   01/16/19 1800  vancomycin (VANCOCIN) 500 mg in sodium chloride 0.9 % 100 mL IVPB     01/16/19 2323   01/13/19 1900  vancomycin (VANCOCIN) IVPB 1000 mg/200 mL premix     01/13/19 2200   01/12/19 1800  ceFEPIme (MAXIPIME) 1 g in sodium chloride 0.9 % 100 mL IVPB     01/17/19 2359   01/12/19 1600  sulfamethoxazole-trimethoprim (BACTRIM) 400-80 MG per tablet 1 tablet    Note to Pharmacy: Take one tablet by mouth on Monday, Wednesday and fridays.         01/11/19 0830  vancomycin (VANCOCIN) IVPB 1000 mg/200 mL premix  Status:  Discontinued     01/11/19 0901   01/11/19 0830  ceFEPIme (MAXIPIME) 2 g in sodium chloride 0.9 % 100 mL IVPB     01/11/19 0952       Devices    LINES / TUBES:  Continuous Infusions: . sodium chloride    . dextrose Stopped (01/18/19 1205)  . remdesivir 100 mg in NS 250 mL Stopped (01/18/19 1012)     Objective: Vitals:   01/19/19 0055 01/19/19 0407 01/19/19 0415 01/19/19 0813  BP: (!) 162/65 (!) 165/79  (!) 185/77  Pulse: 84 81  83  Resp: (!) 28 (!) 30  (!) 25  Temp: (!) 96.4 F (35.8 C) (!) 96.7 F (35.9 C)  (!) 96.5 F (35.8 C)  TempSrc: Axillary Axillary  Axillary  SpO2: 97% 99%  100%  Weight:   45.6 kg   Height:        Intake/Output Summary (Last 24 hours) at 01/19/2019 0846 Last data filed at 01/19/2019 0815 Gross per 24 hour  Intake 1677.44 ml  Output 1400 ml  Net  277.44 ml   Filed Weights   01/17/19 0430 01/18/19 0500 01/19/19 0415  Weight: 47.3 kg 47 kg 45.6 kg   Physical Exam:  General: Alert, can answer yes/no raised questions.  Becoming extremely weak, no acute respiratory distress, cachectic Eyes: negative scleral hemorrhage, negative anisocoria, negative icterus ENT: Negative Runny nose, negative gingival bleeding, Neck:  Negative scars, masses, torticollis, lymphadenopathy, JVD Lungs: Clear to auscultation bilaterally without wheezes or crackles Cardiovascular: Regular rate and rhythm without murmur gallop or rub normal S1 and S2 Abdomen: negative abdominal pain, nondistended, positive soft, bowel sounds, no rebound, no ascites, no appreciable mass Extremities: No significant cyanosis, clubbing, or edema bilateral lower extremities Skin: Negative rashes, lesions, ulcers Psychiatric:  Negative depression, negative anxiety, negative fatigue, negative mania  Central nervous system:  Cranial nerves II through XII intact, tongue/uvula midline, all extremities muscle strength 5/5, sensation intact throughout, finger negative dysarthria, negative expressive aphasia, negative receptive aphasia. .     Data Reviewed: Care during the described time interval was provided by me .  I have reviewed this patient's available data, including medical history, events of note, physical examination, and all test results as part of my evaluation.   CBC: Recent Labs  Lab 01/15/19 0000  01/16/19 0020 01/16/19 0535 01/17/19 0027 01/18/19 0215 01/19/19 0450  WBC 14.5*  --  14.8*  --  14.8* 8.7 8.8  NEUTROABS 13.4*  --  13.5*  --  13.6* 7.7 7.6  HGB 7.4*   < > 7.1* 8.8* 9.5* 9.3* 10.3*  HCT 21.8*   < > 21.5* 26.0* 28.2* 27.7* 30.5*  MCV 85.5  --  87.8  --  84.7 85.2 84.3  PLT 123*  --  132*  --  148* 146* 154   < > = values in this interval not displayed.   Basic Metabolic Panel: Recent Labs  Lab 01/15/19 0000 01/15/19 1540 01/16/19 0020 01/16/19  0535 01/17/19 0027 01/18/19 0215 01/19/19 0450  NA 138  --  144 147* 150* 150* 153*  K 3.3* 4.4 4.2 3.4* 2.9* 3.2* 4.2  CL 105  --  112*  --  114* 112* 120*  CO2 21*  --  19*  --  21* 22 21*  GLUCOSE 148*  --  147*  --  90 236* 206*  BUN 86*  --  93*  --  100* 128* 142*  CREATININE 4.83*  --  4.89*  --  5.12* 5.79* 5.78*  CALCIUM 7.4*  --  8.0*  --  8.3* 9.0 8.9  MG 2.0 1.9 1.9  --  1.8 2.2 2.2  PHOS 2.1*  --  3.9  --  4.0 4.5 5.4*   GFR: Estimated  Creatinine Clearance: 7.6 mL/min (A) (by C-G formula based on SCr of 5.78 mg/dL (H)). Liver Function Tests: Recent Labs  Lab 01/15/19 0000 01/16/19 0020 01/17/19 0027 01/18/19 0215 01/19/19 0450  AST 60* 53* 43* 45* 25  ALT 38 37 31 41 31  ALKPHOS 66 65 67 94 89  BILITOT 0.6 1.2 1.1 0.6 0.7  PROT 6.4* 7.0 7.2 7.9 8.1  ALBUMIN 1.6* 2.5* 2.5* 2.4* 2.6*   No results for input(s): LIPASE, AMYLASE in the last 168 hours. No results for input(s): AMMONIA in the last 168 hours. Coagulation Profile: No results for input(s): INR, PROTIME in the last 168 hours. Cardiac Enzymes: No results for input(s): CKTOTAL, CKMB, CKMBINDEX, TROPONINI in the last 168 hours. BNP (last 3 results) No results for input(s): PROBNP in the last 8760 hours. HbA1C: No results for input(s): HGBA1C in the last 72 hours. CBG: Recent Labs  Lab 01/18/19 1144 01/18/19 1611 01/19/19 0021 01/19/19 0413 01/19/19 0729  GLUCAP 129* 145* 228* 197* 162*   Lipid Profile: No results for input(s): CHOL, HDL, LDLCALC, TRIG, CHOLHDL, LDLDIRECT in the last 72 hours. Thyroid Function Tests: No results for input(s): TSH, T4TOTAL, FREET4, T3FREE, THYROIDAB in the last 72 hours. Anemia Panel: Recent Labs    01/17/19 0027 01/18/19 0215  FERRITIN 4,758* 5,143*   Urine analysis:    Component Value Date/Time   COLORURINE AMBER (A) 01/11/2019 1455   APPEARANCEUR CLOUDY (A) 01/11/2019 1455   LABSPEC 1.013 01/11/2019 1455   PHURINE 5.0 01/11/2019 1455   GLUCOSEU 50  (A) 01/11/2019 1455   HGBUR MODERATE (A) 01/11/2019 1455   BILIRUBINUR NEGATIVE 01/11/2019 1455   KETONESUR NEGATIVE 01/11/2019 1455   PROTEINUR 100 (A) 01/11/2019 1455   UROBILINOGEN 1.0 12/20/2014 1252   NITRITE NEGATIVE 01/11/2019 1455   LEUKOCYTESUR NEGATIVE 01/11/2019 1455   Sepsis Labs: @LABRCNTIP (procalcitonin:4,lacticidven:4)  ) Recent Results (from the past 240 hour(s))  Blood Culture (routine x 2)     Status: None   Collection Time: 01/11/19  8:00 AM   Specimen: BLOOD LEFT FOREARM  Result Value Ref Range Status   Specimen Description BLOOD LEFT FOREARM  Final   Special Requests   Final    BOTTLES DRAWN AEROBIC AND ANAEROBIC Blood Culture adequate volume   Culture   Final    NO GROWTH 5 DAYS Performed at [facility], [place] [address], [place], [place] [address]    Report Status 01/16/2019 FINAL  Final  Blood Culture (routine x 2)     Status: None   Collection Time: 01/11/19  8:15 AM   Specimen: BLOOD RIGHT HAND  Result Value Ref Range Status   Specimen Description BLOOD RIGHT HAND  Final   Special Requests   Final    BOTTLES DRAWN AEROBIC ONLY Blood Culture adequate volume   Culture   Final    NO GROWTH 5 DAYS Performed at [facility], [place] [address], [place], [place] [address]    Report Status 01/16/2019 FINAL  Final  Culture, Urine     Status: Abnormal   Collection Time: 01/11/19  2:55 PM   Specimen: Urine, Clean Catch  Result Value Ref Range Status   Specimen Description URINE, CLEAN CATCH  Final   Special Requests   Final    NONE Performed at [facility], [place] [address], [place], [place] [address]    Culture 40,000 COLONIES/mL STAPHYLOCOCCUS EPIDERMIDIS (A)  Final   Report Status 01/13/2019 FINAL  Final   Organism ID, Bacteria  STAPHYLOCOCCUS EPIDERMIDIS (A)  Final      Susceptibility   Staphylococcus epidermidis - MIC*    CIPROFLOXACIN >=8 RESISTANT Resistant     GENTAMICIN <=0.5 SENSITIVE Sensitive     NITROFURANTOIN <=16  SENSITIVE Sensitive     OXACILLIN >=4 RESISTANT Resistant     TETRACYCLINE 2 SENSITIVE Sensitive     VANCOMYCIN 1 SENSITIVE Sensitive     TRIMETH/SULFA 160 RESISTANT Resistant     CLINDAMYCIN >=8 RESISTANT Resistant     RIFAMPIN <=0.5 SENSITIVE Sensitive     Inducible Clindamycin NEGATIVE Sensitive     * 40,000 COLONIES/mL STAPHYLOCOCCUS EPIDERMIDIS  Culture, blood (routine x 2)     Status: None (Preliminary result)   Collection Time: 01/16/19  7:45 AM   Specimen: BLOOD  Result Value Ref Range Status   Specimen Description   Final    BLOOD RIGHT ANTECUBITAL Performed at [facility] [address], [place], [place] [address]    Special Requests   Final    BOTTLES DRAWN AEROBIC ONLY Blood Culture adequate volume Performed at [facility] [address], [place], [place] [address]    Culture   Final    NO GROWTH 2 DAYS Performed at [facility] [address], [place], [place] [address]    Report Status PENDING  Incomplete  Culture, blood (routine x 2)     Status: None (Preliminary result)   Collection Time: 01/16/19  7:50 AM   Specimen: BLOOD  Result Value Ref Range Status   Specimen Description   Final    BLOOD RIGHT HAND Performed at [facility] [address], [place], [place] [address]    Special Requests   Final    BOTTLES DRAWN AEROBIC ONLY Blood Culture adequate volume Performed at [facility] [address], [place], [place] [address]    Culture   Final    NO GROWTH 2 DAYS Performed at [facility] [address] [place], [place] [address]    Report Status PENDING  Incomplete         Radiology Studies: Dg Chest Port 1 View  Result Date: 01/17/2019 CLINICAL DATA:  Shortness of breath EXAM: PORTABLE CHEST 1 VIEW COMPARISON:  01/16/2019 FINDINGS: No significant interval change in AP portable examination with diffuse interstitial and heterogeneous airspace opacity.  Mild cardiomegaly. IMPRESSION: No significant interval change in AP portable examination with diffuse interstitial and heterogeneous airspace opacity. Findings remain consistent with multifocal infection and/or edema. Electronically Signed   By: Eddie Candle M.D.   On: 01/17/2019 12:41        Scheduled Meds: . amLODipine  10 mg Oral Daily  . aspirin EC  81 mg Oral Daily  . cloNIDine  0.2 mg Oral BID  . dexamethasone (DECADRON) injection  6 mg Intravenous Q24H  . feeding supplement (ENSURE ENLIVE)  237 mL Oral TID BM  . insulin aspart  0-9 Units Subcutaneous Q4H  . Ipratropium-Albuterol  1 puff Inhalation Q6H  . mouth rinse  15 mL Mouth Rinse BID  . metoprolol tartrate  50 mg Oral BID  . mycophenolate  500 mg Oral BID  . pantoprazole (PROTONIX) IV  40 mg Intravenous Q12H  . sodium chloride flush  3 mL Intravenous Q12H  . sulfamethoxazole-trimethoprim  1 tablet Oral Q M,W,F  . tacrolimus  4 mg Oral BID   Continuous Infusions: . sodium chloride    . dextrose Stopped (01/18/19 1205)  . remdesivir  100 mg in NS 250 mL Stopped (01/18/19 1012)     LOS: 8 days   The patient is critically ill with multiple organ systems failure and requires high complexity decision making for assessment and support, frequent evaluation and titration of therapies, application of advanced monitoring technologies and extensive interpretation of multiple databases. Critical Care Time devoted to patient care services described in this note  Time spent: 40 minutes     Jamorion Gomillion, Geraldo Docker, MD [facility] Pager [phone]  If 7PM-7AM, please contact night-coverage [email or web address] Password [facility] 01/19/2019, 8:46 AM

## 2019-01-19 NOTE — Plan of Care (Signed)
Teaching continued with patient, although patient participation limited d/t medical acuity/MS. Will continue teaching with family via phone with updates. VSS at this time. Afebrile. SpO2 stable on RA. No anxiety noted, emotional support provided. Incontinent of urine, condom cath intact, replaced today. +BS/+BM 01/19/19, incontinent. No c/o or s/s of pain noted. Safe environment of care maintained. Discharge planning ongoing, pt not appropriate for d/c at this time. Pt unable to get OOB d/t extreme generalized weakness. Very poor PO intake, will plan to place NGT and start TF to support nutrition. Will continue to monitor.

## 2019-01-19 NOTE — Progress Notes (Signed)
Subjective:  Events noted - pt appears to have a significant free water deficit judging by his sodium and BUN.  I had already mentioned to the primary team getting a tube where free water can be given   Objective Vital signs in last 24 hours: Vitals:   01/19/19 0407 01/19/19 0415 01/19/19 0813 01/19/19 1149  BP: (!) 165/79  (!) 185/77 (!) 159/80  Pulse: 81  83 75  Resp: (!) 30  (!) 25 (!) 25  Temp: (!) 96.7 F (35.9 C)  (!) 96.5 F (35.8 C) (!) 96.4 F (35.8 C)  TempSrc: Axillary  Axillary Axillary  SpO2: 99%  100% 99%  Weight:  45.6 kg    Height:       Weight change: -1.452 kg  Intake/Output Summary (Last 24 hours) at 01/19/2019 1258 Last data filed at 01/19/2019 1100 Gross per 24 hour  Intake 638.31 ml  Output 1400 ml  Net -761.69 ml    Assessment/ Plan: Pt is a 73 y.o. yo male s/p renal transplant but with CKD at baseline crt in the 3's who was admitted on 01/11/2019 with weakness after COVID treatment  Assessment/Plan: 1. Renal-  History of CKD and renal transplant.  A on chronic change due to illness and volume/free water depletion-  To attempt to replace today.  I would want to correct free water deficit before I would do something like dialysis for his uremia 2. S/p renal transplant-  Is on prograf and very low dose cellcept   3. Anemia- is on procrit normally for anemia-  Supportive therapy   4. HTN/volume-  Hypertensive but with free water deficit.   Give free water  (inc d5W and give free water per tube) also continue with antihypertensivies  Louis Meckel    Labs: Basic Metabolic Panel: Recent Labs  Lab 01/17/19 0027 01/18/19 0215 01/19/19 0450  NA 150* 150* 153*  K 2.9* 3.2* 4.2  CL 114* 112* 120*  CO2 21* 22 21*  GLUCOSE 90 236* 206*  BUN 100* 128* 142*  CREATININE 5.12* 5.79* 5.78*  CALCIUM 8.3* 9.0 8.9  PHOS 4.0 4.5 5.4*   Liver Function Tests: Recent Labs  Lab 01/17/19 0027 01/18/19 0215 01/19/19 0450  AST 43* 45* 25  ALT 31 41 31   ALKPHOS 67 94 89  BILITOT 1.1 0.6 0.7  PROT 7.2 7.9 8.1  ALBUMIN 2.5* 2.4* 2.6*   No results for input(s): LIPASE, AMYLASE in the last 168 hours. No results for input(s): AMMONIA in the last 168 hours. CBC: Recent Labs  Lab 01/15/19 0000  01/16/19 0020  01/17/19 0027 01/18/19 0215 01/19/19 0450  WBC 14.5*  --  14.8*  --  14.8* 8.7 8.8  NEUTROABS 13.4*  --  13.5*  --  13.6* 7.7 7.6  HGB 7.4*   < > 7.1*   < > 9.5* 9.3* 10.3*  HCT 21.8*   < > 21.5*   < > 28.2* 27.7* 30.5*  MCV 85.5  --  87.8  --  84.7 85.2 84.3  PLT 123*  --  132*  --  148* 146* 154   < > = values in this interval not displayed.   Cardiac Enzymes: No results for input(s): CKTOTAL, CKMB, CKMBINDEX, TROPONINI in the last 168 hours. CBG: Recent Labs  Lab 01/18/19 1144 01/18/19 1611 01/19/19 0021 01/19/19 0413 01/19/19 0729  GLUCAP 129* 145* 228* 197* 162*    Iron Studies:  Recent Labs    01/19/19 0450  FERRITIN 5,344*  Studies/Results: No results found. Medications: Infusions: . sodium chloride    . dextrose 75 mL/hr at 01/19/19 0939  . remdesivir 100 mg in NS 250 mL Stopped (01/19/19 1012)    Scheduled Medications: . amLODipine  10 mg Oral Daily  . aspirin EC  81 mg Oral Daily  . cloNIDine  0.2 mg Oral BID  . dexamethasone (DECADRON) injection  6 mg Intravenous Q24H  . feeding supplement (ENSURE ENLIVE)  237 mL Oral TID BM  . feeding supplement (NEPRO CARB STEADY)  1,000 mL Per Tube Q24H  . insulin aspart  0-9 Units Subcutaneous Q4H  . Ipratropium-Albuterol  1 puff Inhalation Q6H  . mouth rinse  15 mL Mouth Rinse BID  . metoprolol tartrate  50 mg Oral BID  . mycophenolate  500 mg Oral BID  . pantoprazole (PROTONIX) IV  40 mg Intravenous Q12H  . sodium chloride flush  3 mL Intravenous Q12H  . sulfamethoxazole-trimethoprim  1 tablet Oral Q M,W,F  . tacrolimus  4 mg Oral BID    have reviewed scheduled and prn medications.  Physical Exam: PE not performed as patient in isolation for  COVD in order to preserve PPE and to avoid exposure to more providers.  Case discussed with Dr. [name]    01/19/2019,12:58 PM  LOS: 8 days

## 2019-01-19 NOTE — Consult Note (Signed)
Consultation Note Date: 01/19/2019   Patient Name: Francisco Williamson  DOB: 05-Aug-1945  MRN: [id number]  Age / Sex: 73 y.o., male  PCP: Default, Provider, MD Referring Physician: [name], MD  Reason for Consultation: Establishing goals of care  HPI/Patient Profile: 72 y.o. male  with past medical history of HTN and stage V CKD s/p renal transplantation admitted on 01/11/2019 with acute renal failure on CKD, sepsis due to staph UTI, HCAP with COVID-19 pneumonia.   Clinical Assessment and Goals of Care: Virtual consult due to Covid restrictions.  Chart reviewed in detail, noting BMI of 16.7, low albumin at 2.6, worsening kidney function.   Conference with attending and bedside nursing staff related to patient condition.    Call to niece, Kerin Perna at [phone].  Left generic voicemail message with callback number.  Call to additional number at [phone], again left generic voicemail message.    Call to friend, Zollie Beckers at [phone].  Answering machine with no ID, no voicemail left.  Detailed conversation with attending related to patient condition, needs.   PMT to continue to follow.  HCPOA    NEXT OF KIN - niece, Kerin Perna listed under emergency contacts. Would be beneficial to ask further questions related to next of kin and healthcare power of attorney.   SUMMARY OF RECOMMENDATIONS   At this point, continue to treat the treatable but no CPR, no intubation.  Code Status/Advance Care Planning:  DNR  Symptom Management:   Per hospitalist, no additional needs at this time.  Palliative Prophylaxis:   Oral Care and Turn Reposition  Additional Recommendations (Limitations, Scope, Preferences):  Treat the treatable but no CPR, no intubation.  Psycho-social/Spiritual:   Desire for further Chaplaincy support:no  Additional Recommendations: Caregiving  Support/Resources  and Education on Hospice  Prognosis:   Unable to determine, based on outcomes.  Discharge Planning: To be determined, based on outcomes.  Would clearly benefit from rehab.      Primary Diagnoses: Present on Admission: . Acute renal failure superimposed on chronic kidney disease ([place]) . Pneumonia due to COVID-19 virus . Essential hypertension . Severe sepsis ([place]) . Sepsis secondary to UTI ([place]) . HCAP (healthcare-associated pneumonia) . CKD stage 5 secondary to hypertension ([place]) . Bilateral pleural effusion   I have reviewed the medical record, interviewed the patient and family, and examined the patient. The following aspects are pertinent.  Past Medical History:  Diagnosis Date  . Hypertension   . Renal disorder    Social History   Socioeconomic History  . Marital status: Single    Spouse name: Not on file  . Number of children: Not on file  . Years of education: Not on file  . Highest education level: Not on file  Occupational History  . Not on file  Social Needs  . Financial resource strain: Not on file  . Food insecurity    Worry: Not on file    Inability: Not on file  . Transportation needs  Medical: Not on file    Non-medical: Not on file  Tobacco Use  . Smoking status: Never Smoker  . Smokeless tobacco: Never Used  Substance and Sexual Activity  . Alcohol use: No  . Drug use: No  . Sexual activity: Not on file  Lifestyle  . Physical activity    Days per week: Not on file    Minutes per session: Not on file  . Stress: Not on file  Relationships  . Social Herbalist on phone: Not on file    Gets together: Not on file    Attends religious service: Not on file    Active member of club or organization: Not on file    Attends meetings of clubs or organizations: Not on file    Relationship status: Not on file  Other Topics Concern  . Not on file  Social History Narrative  . Not on file   History reviewed. No pertinent family  history. Scheduled Meds: . amLODipine  10 mg Oral Daily  . aspirin EC  81 mg Oral Daily  . cloNIDine  0.2 mg Oral BID  . dexamethasone (DECADRON) injection  6 mg Intravenous Q24H  . feeding supplement (ENSURE ENLIVE)  237 mL Oral TID BM  . feeding supplement (NEPRO CARB STEADY)  1,000 mL Per Tube Q24H  . insulin aspart  0-9 Units Subcutaneous Q4H  . Ipratropium-Albuterol  1 puff Inhalation Q6H  . mouth rinse  15 mL Mouth Rinse BID  . metoprolol tartrate  50 mg Oral BID  . mycophenolate  500 mg Oral BID  . pantoprazole (PROTONIX) IV  40 mg Intravenous Q12H  . sodium chloride flush  3 mL Intravenous Q12H  . sulfamethoxazole-trimethoprim  1 tablet Oral Q M,W,F  . tacrolimus  4 mg Oral BID   Continuous Infusions: . sodium chloride    . dextrose 75 mL/hr at 01/19/19 0939  . remdesivir 100 mg in NS 250 mL Stopped (01/19/19 1012)   PRN Meds:.sodium chloride, acetaminophen, calcium carbonate (dosed in mg elemental calcium), camphor-menthol **AND** hydrOXYzine, docusate sodium, docusate sodium, hydrALAZINE, labetalol, loperamide, oxyCODONE, sodium chloride flush, zolpidem Medications Prior to Admission:  Prior to Admission medications   Medication Sig Start Date End Date Taking? Authorizing Provider  aspirin EC 81 MG tablet Take 81 mg by mouth daily.   Yes [provider]  Darbepoetin Alfa (ARANESP) 300 MCG/0.6ML SOSY injection Inject 300 mcg into the skin every 7 (seven) days.   Yes [provider]  sulfamethoxazole-trimethoprim (BACTRIM,SEPTRA) 400-80 MG per tablet Take 1 tablet by mouth every Monday, Wednesday, and Friday. Take one tablet by mouth on Monday, Wednesday and fridays. 11/21/18  Yes [provider]  acetaminophen (TYLENOL) 325 MG tablet Take 2 tablets (650 mg total) by mouth every 6 (six) hours as needed for mild pain or headache (fever >/= 101). 01/06/19   [name], MD  amLODipine (NORVASC) 10 MG tablet Take 10 mg by mouth daily. 06/17/14    [provider]  calcitRIOL (ROCALTROL) 0.5 MCG capsule Take 0.5 mcg by mouth daily. 06/17/14   [provider]  cloNIDine (CATAPRES) 0.2 MG tablet Take 1 tablet (0.2 mg total) by mouth 2 (two) times daily. 01/06/19   [name], MD  ferrous sulfate 325 (65 FE) MG EC tablet Take 325 mg by mouth 2 (two) times daily.    [provider]  Ipratropium-Albuterol (COMBIVENT) 20-100 MCG/ACT AERS respimat Inhale 1 puff into the lungs every 6 (six)  hours. 01/06/19   [name], MD  labetalol (NORMODYNE) 300 MG tablet Take 1 tablet (300 mg total) by mouth 3 (three) times daily. Patient taking differently: Take 300 mg by mouth 2 (two) times daily.  01/06/19   [name], MD  magnesium oxide (MAG-OX) 400 MG tablet Take 400 mg by mouth 2 (two) times daily.    [provider]  mycophenolate (CELLCEPT) 500 MG tablet Take 500 mg by mouth 2 (two) times daily.    [provider]  ondansetron (ZOFRAN) 4 MG tablet Take 1 tablet (4 mg total) by mouth every 6 (six) hours as needed for nausea. Patient not taking: Reported on 01/11/2019 01/06/19   [name], MD  pantoprazole (PROTONIX) 40 MG tablet Take 40 mg by mouth daily.    [provider]  PREDNISONE PO Take 5 mg by mouth 2 (two) times daily.     [provider]  sildenafil (VIAGRA) 100 MG tablet Take 100 mg by mouth as needed for erectile dysfunction.    [provider]  sodium bicarbonate 650 MG tablet Take 650 mg by mouth 3 (three) times daily.    [provider]  tacrolimus (PROGRAF) 1 MG capsule Take 1 mg by mouth 2 (two) times daily.     [provider]  traZODone (DESYREL) 50 MG tablet Take 0.5 tablets (25 mg total) by mouth at bedtime as needed for sleep. Patient not taking: Reported on 01/11/2019 01/06/19   [name], MD  vitamin C (VITAMIN C) 500 MG tablet Take 1 tablet (500 mg total) by mouth daily. 01/07/19   [name], MD  zinc sulfate 220  (50 Zn) MG capsule Take 1 capsule (220 mg total) by mouth daily. 01/07/19   [name], MD   No Known Allergies Review of Systems  Unable to perform ROS: Acuity of condition    Physical Exam Vitals signs and nursing note reviewed.  Constitutional:      Comments: Telephone consult dt Covid      Vital Signs: BP (!) 159/80 (BP Location: Right Arm)   Pulse 75   Temp (!) 96.4 F (35.8 C) (Axillary)   Resp (!) 25   Ht 5\' 5"  (1.651 m)   Wt 45.6 kg   SpO2 99%   BMI 16.72 kg/m  Pain Scale: 0-10   Pain Score: 0-No pain   SpO2: SpO2: 99 % O2 Device:SpO2: 99 % O2 Flow Rate: .O2 Flow Rate (L/min): 1 L/min  IO: Intake/output summary:   Intake/Output Summary (Last 24 hours) at 01/19/2019 1255 Last data filed at 01/19/2019 1100 Gross per 24 hour  Intake 638.31 ml  Output 1400 ml  Net -761.69 ml    LBM: Last BM Date: 01/19/19 Baseline Weight: Weight: 43.7 kg Most recent weight: Weight: 45.6 kg     Palliative Assessment/Data:   Flowsheet Rows     Most Recent Value  Intake Tab  Referral Department  Hospitalist  Unit at Time of Referral  Other (Comment) [[facility]]  Date Notified  01/17/19  Palliative Care Type  New Palliative care  Reason for referral  Clarify Goals of Care  Date first seen by Palliative Care  01/19/19  # of days Palliative referral response time  2 Day(s)  Clinical Assessment  Palliative Performance Scale Score  30%  Pain Max last 24 hours  Not able to report  Pain Min Last 24 hours  Not able to report  Dyspnea Max Last 24 Hours  Not able to report  Dyspnea Min Last 24 hours  Not able to report  Psychosocial & Spiritual Assessment  Palliative Care Outcomes      Time In: 1250  Time Out: 1340 Time Total: 50 minutes  Greater than 50%  of this time was spent counseling and coordinating care related to the above assessment and plan.  Signed by: Drue Novel, NP   Please contact Palliative Medicine Team phone at [phone] for questions and concerns.   For individual provider: See Shea Evans

## 2019-01-19 NOTE — Progress Notes (Signed)
ANTICOAGULATION CONSULT NOTE - Initial Consult  Pharmacy Consult for Heparin Indication: VTE prophylaxis  No Known Allergies  Patient Measurements: Height: 5\' 5"  (165.1 cm) Weight: 100 lb 8 oz (45.6 kg) IBW/kg (Calculated) : 61.5  Vital Signs: Temp: 96.4 F (35.8 C) (11/16 1149) Temp Source: Axillary (11/16 1149) BP: 159/80 (11/16 1149) Pulse Rate: 75 (11/16 1149)  Labs: Recent Labs    01/17/19 0027 01/18/19 0215 01/19/19 0450  HGB 9.5* 9.3* 10.3*  HCT 28.2* 27.7* 30.5*  PLT 148* 146* 154  CREATININE 5.12* 5.79* 5.78*    Estimated Creatinine Clearance: 7.6 mL/min (A) (by C-G formula based on SCr of 5.78 mg/dL (H)).   Medical History: Past Medical History:  Diagnosis Date  . Hypertension   . Renal disorder     Medications:  Scheduled:  . amLODipine  10 mg Oral Daily  . aspirin EC  81 mg Oral Daily  . cloNIDine  0.2 mg Oral BID  . dexamethasone (DECADRON) injection  6 mg Intravenous Q24H  . feeding supplement (ENSURE ENLIVE)  237 mL Oral TID BM  . feeding supplement (NEPRO CARB STEADY)  1,000 mL Per Tube Q24H  . free water  300 mL Per Tube Q3H  . insulin aspart  0-9 Units Subcutaneous Q4H  . Ipratropium-Albuterol  1 puff Inhalation Q6H  . mouth rinse  15 mL Mouth Rinse BID  . metoprolol tartrate  50 mg Oral BID  . mycophenolate  500 mg Oral BID  . pantoprazole (PROTONIX) IV  40 mg Intravenous Q12H  . sodium chloride flush  3 mL Intravenous Q12H  . sulfamethoxazole-trimethoprim  1 tablet Oral Q M,W,F  . tacrolimus  4 mg Oral BID   Infusions:  . sodium chloride    . dextrose 125 mL/hr at 01/19/19 1500  . remdesivir 100 mg in NS 250 mL Stopped (01/19/19 1012)    Assessment: Pharmacy is consulted to restart Heparin [place] dosing for VTE prophylaxis on 11/17.  Per MD, pt had previous GI bleed, but has been stable for several days, on Protonix 40 IV BID.    10/28 heparin 5000 TID >> 11/3 11/8 heparin 5000 TID >> 11/10  Goal of Therapy:  Monitor platelets  by anticoagulation protocol: Yes   Plan:  Heparin 5000 [place] q8h Pharmacy will sign off.   Gretta Arab PharmD, BCPS Clinical pharmacist phone 7am- 5pm: [phone] 01/19/2019 3:41 PM

## 2019-01-19 NOTE — Progress Notes (Signed)
NGT placement was not successful-  Pt lethargic from being uremic.  As I stated previously would like to be able to replete free water to see if uremia could be improved upon before doing something like dialysis.  If he were to need dialysis it would be easier for him to do IHD via his fistula than need to place vascath for CRRT.  I discussed with Dr. [name]-  Would be preferable to transfer him to [facility], can get coretrack and then could get IHD if he needed it here  Louis Meckel

## 2019-01-19 NOTE — Progress Notes (Signed)
Called and spoke with patient's niece Kerin Perna, who merged patient's daughter Forest Carnahan onto call. Niece and daughter updated on patient's status and potential transfer to [facility], if bed available per MD. All questions answered at this time.

## 2019-01-19 NOTE — Progress Notes (Signed)
Attempted to place NGT via R and L nare unsuccessfully. R nare passage ?into lungs d/t coughing. Removed tube, and attempted again via L nare. During insertions, patient became very agitated and was thrashing and pulling at tubes. [place], RN in room to assist with insertion attempts. Will notify provider that NGT placement was unsuccessful and we will not be able to start TF or free water flushes at this time.

## 2019-01-20 DIAGNOSIS — E860 Dehydration: Secondary | ICD-10-CM

## 2019-01-20 LAB — COMPREHENSIVE METABOLIC PANEL
ALT: 24 U/L (ref 0–44)
AST: 22 U/L (ref 15–41)
Albumin: 2.3 g/dL — ABNORMAL LOW (ref 3.5–5.0)
Alkaline Phosphatase: 81 U/L (ref 38–126)
Anion gap: 15 (ref 5–15)
BUN: 122 mg/dL — ABNORMAL HIGH (ref 8–23)
CO2: 19 mmol/L — ABNORMAL LOW (ref 22–32)
Calcium: 8.5 mg/dL — ABNORMAL LOW (ref 8.9–10.3)
Chloride: 116 mmol/L — ABNORMAL HIGH (ref 98–111)
Creatinine, Ser: 5.66 mg/dL — ABNORMAL HIGH (ref 0.61–1.24)
GFR calc Af Amer: 11 mL/min — ABNORMAL LOW (ref >60)
GFR calc non Af Amer: 9 mL/min — ABNORMAL LOW (ref >60)
Glucose, Bld: 164 mg/dL — ABNORMAL HIGH (ref 70–99)
Potassium: 3.8 mmol/L (ref 3.5–5.1)
Sodium: 150 mmol/L — ABNORMAL HIGH (ref 135–145)
Total Bilirubin: 0.8 mg/dL (ref 0.3–1.2)
Total Protein: 7.4 g/dL (ref 6.5–8.1)

## 2019-01-20 LAB — CBC WITH DIFFERENTIAL/PLATELET
Abs Immature Granulocytes: 0.1 10*3/uL — ABNORMAL HIGH (ref 0.00–0.07)
Basophils Absolute: 0 10*3/uL (ref 0.0–0.1)
Basophils Relative: 0 %
Eosinophils Absolute: 0 10*3/uL (ref 0.0–0.5)
Eosinophils Relative: 0 %
HCT: 27.6 % — ABNORMAL LOW (ref 39.0–52.0)
Hemoglobin: 9.1 g/dL — ABNORMAL LOW (ref 13.0–17.0)
Immature Granulocytes: 1 %
Lymphocytes Relative: 9 %
Lymphs Abs: 0.7 10*3/uL (ref 0.7–4.0)
MCH: 28.3 pg (ref 26.0–34.0)
MCHC: 33 g/dL (ref 30.0–36.0)
MCV: 85.7 fL (ref 80.0–100.0)
Monocytes Absolute: 0.2 10*3/uL (ref 0.1–1.0)
Monocytes Relative: 2 %
Neutro Abs: 6.4 10*3/uL (ref 1.7–7.7)
Neutrophils Relative %: 88 %
Platelets: 137 10*3/uL — ABNORMAL LOW (ref 150–400)
RBC: 3.22 MIL/uL — ABNORMAL LOW (ref 4.22–5.81)
RDW: 17.4 % — ABNORMAL HIGH (ref 11.5–15.5)
WBC: 7.4 10*3/uL (ref 4.0–10.5)
nRBC: 0 % (ref 0.0–0.2)

## 2019-01-20 LAB — GLUCOSE, CAPILLARY
Glucose-Capillary: 177 mg/dL — ABNORMAL HIGH (ref 70–99)
Glucose-Capillary: 143 mg/dL — ABNORMAL HIGH (ref 70–99)
Glucose-Capillary: 123 mg/dL — ABNORMAL HIGH (ref 70–99)
Glucose-Capillary: 145 mg/dL — ABNORMAL HIGH (ref 70–99)
Glucose-Capillary: 149 mg/dL — ABNORMAL HIGH (ref 70–99)
Glucose-Capillary: 154 mg/dL — ABNORMAL HIGH (ref 70–99)
Glucose-Capillary: 196 mg/dL — ABNORMAL HIGH (ref 70–99)
Glucose-Capillary: 362 mg/dL — ABNORMAL HIGH (ref 70–99)
Glucose-Capillary: 88 mg/dL (ref 70–99)
Glucose-Capillary: 98 mg/dL (ref 70–99)
Glucose-Capillary: 68 mg/dL — ABNORMAL LOW (ref 70–99)
Glucose-Capillary: 272 mg/dL — ABNORMAL HIGH (ref 70–99)
Glucose-Capillary: 410 mg/dL — ABNORMAL HIGH (ref 70–99)
Glucose-Capillary: 433 mg/dL — ABNORMAL HIGH (ref 70–99)
Glucose-Capillary: 98 mg/dL (ref 70–99)
Glucose-Capillary: 208 mg/dL — ABNORMAL HIGH (ref 70–99)
Glucose-Capillary: 264 mg/dL — ABNORMAL HIGH (ref 70–99)
Glucose-Capillary: 262 mg/dL — ABNORMAL HIGH (ref 70–99)

## 2019-01-20 LAB — MAGNESIUM: Magnesium: 1.9 mg/dL (ref 1.7–2.4)

## 2019-01-20 LAB — C-REACTIVE PROTEIN: CRP: 13.6 mg/dL — ABNORMAL HIGH (ref <1.0)

## 2019-01-20 LAB — FERRITIN: Ferritin: 2934 ng/mL — ABNORMAL HIGH (ref 24–336)

## 2019-01-20 LAB — D-DIMER, QUANTITATIVE: D-Dimer, Quant: 2.74 ug/mL-FEU — ABNORMAL HIGH (ref 0.00–0.50)

## 2019-01-20 LAB — PHOSPHORUS: Phosphorus: 5.3 mg/dL — ABNORMAL HIGH (ref 2.5–4.6)

## 2019-01-20 MED ORDER — CHLORHEXIDINE GLUCONATE CLOTH 2 % EX PADS
6.0000 | MEDICATED_PAD | Freq: Every day | CUTANEOUS | Status: DC
  Administered 2019-01-20 – 2019-01-22 (×3): 6 via TOPICAL

## 2019-01-20 MED ORDER — ALBUTEROL SULFATE HFA 108 (90 BASE) MCG/ACT IN AERS
4.0000 | INHALATION_SPRAY | RESPIRATORY_TRACT | Status: DC | PRN
  Filled 2019-01-20: qty 6.7

## 2019-01-20 MED ORDER — VITAL AF 1.2 CAL PO LIQD
1000.0000 mL | ORAL | Status: DC
  Administered 2019-01-20 – 2019-01-25 (×6): 1000 mL
  Filled 2019-01-20 (×5): qty 1000

## 2019-01-20 NOTE — Progress Notes (Signed)
PROGRESS NOTE    Francisco Williamson  [id number] DOB: 1945/12/10 DOA: 01/11/2019 PCP: Default, Provider, MD   Brief Narrative:  73 y.o.BM PMHx Essential HTN and CKD stage V (s/p renal transplantation), deconditioning,  Presenting with AMS. He was admitted to [facility] from 10/28-11/3 for COVID-19-associated PNA with acute respiratory failure with hypoxia. He completed a course of Remdesivir and steroids, and he was discharged home on 11/3, patient was brought back to ED, given failure to thrive, dehydration and altered mental status, he was noted to have significant dehydration with hypernatremia and renal failure, he was transferred to [facility] for further management.    Subjective: 11/17 afebrile last 24 hours, patient continues to weaken.  Follows commands.  States he understands that he will be going to [facility] for HD.   Assessment & Plan:   Principal Problem:   Acute renal failure superimposed on chronic kidney disease ([place]) Active Problems:   CKD stage 5 secondary to hypertension ([place])   Pneumonia due to COVID-19 virus   Essential hypertension   History of renal transplant   Severe sepsis (HCC)   Sepsis secondary to UTI ([place])   HCAP (healthcare-associated pneumonia)   Bilateral pleural effusion   Acute hypernatremia   Goals of care, counseling/discussion   Palliative care by specialist      Sepsis due to staph epidermidis UTI -Afebrile, negative leukocytosis.  Received 2 doses of vancomycin but given his renal function is = 7 doses. -Hold all nephrotoxic medication -Bactrim prophylaxis for his renal transplant -CellCept 500 mg  BID for renal transplant  -Tacrolimus 4 mg  BID renal transplant -11/11 Tacrolimus level = 8.5 -11/11 CellCept level pending  Acute on CKD stage V/RENAL transplant patient (Cr = 3.5) Recent Labs  Lab 01/16/19 0020 01/17/19 0027 01/18/19 0215 01/19/19 0450 01/20/19 0442  CREATININE 4.89* 5.12* 5.79* 5.78* 5.66*  -See sepsis UTI  -Strict in and out +7.7L -Daily weight Filed Weights   01/18/19 0500 01/19/19 0415 01/20/19 0400  Weight: 47 kg 45.6 kg 47.2 kg  -11/12 Albumin 50 g + Lasix IV 60 mg -11/13 Albumin 50 g, followed by Lasix IV 120 mg TID (discussed case with nephrology Dr. Jonnie Finner); will see patient today.  Recommendations are for conservative care no CRRT at this time.  -11/14 discussed case with Dr. Jonnie Finner nephrology he would like for me to hold Lasix until kidney function improves.  We will continue to monitor kidney function closely -11/16 discussed case with Dr. Corliss Parish nephrology and she concurs patient needs CRRT/HD.  We both would prefer not to place another line in a patient who has been hospitalized twice within 3 weeks for infection.  Therefore since patient has functional AV fistula will transfer patient to [facility] for CRRT/HD for uremia due to acute on chronic renal failure..  11/17 spoke with flow manager patient should have bed this A.m. [place] on 21M  HCAP/Covid 19 pneumonia  -On previous admission patient completed treatment of remdesivir+ steroids -Not fully convinced patient has HCAP, however patient has elevated procalcitonin level Procalcitonin 11/8 (42.95), 11/11 (26.99), complete 7-day course cefepime -11/13 received 1 dose of vancomycin COVID-19 Labs  Recent Labs    01/18/19 0215 01/19/19 0450 01/20/19 0442  DDIMER 4.60* 4.37* 2.74*  FERRITIN 5,143* 5,344* 2,934*  CRP 28.4* 18.2* 13.6*    Lab Results  Component Value Date   SARSCOV2NAA POSITIVE (A) 12/31/2018  -11/14 patient has worsened with treatment for HCAP believe this is a viral pneumonia (recurrence of Covid/infection  different strain Covid). -11/14 Decadron 6 mg -11/14 Remdesivir per pharmacy protocol -11/14 Covid convalescent plasma  Bilateral pleural effusion/Hypoalbuminemia -See acute on CKD stage V  Essential HTN -Amlodipine 10 mg daily -Clonidine 0.2 mg BID -Hydralazine PRN -Labetalol  PRN -11/13 increase Metoprolol 50 mg BID -11/17 patient to receive HD today we will hold on increasing BP medication, adjust meds if required after HD  GI bleed Recent Labs  Lab 01/16/19 0020 01/16/19 0535 01/17/19 0027 01/18/19 0215 01/19/19 0450  HGB 7.1* 8.8* 9.5* 9.3* 10.3*  -Per EMR overnight 1910: Patient had small amount of blood with his stool, will hold his heparin subcu, will repeat CBC, will get type and screen , and start protonix 40 mg IV BID. -11/13 occult blood pending -11/13 transfuse 1 unit PRBC -11/17 restart subcu heparin per pharmacy -Transfuse for hemoglobin<7   Hypernatremia -11/14 secondary to dehydration; iatrogenic patient on large dose of Lasix.  D/Ced Lasix. -11/16 increase D5W 125 ml/hr -See renal failure  Hypokalemia -Potassium goal l>4 -See renal failure   Hypomagnesmia -Magnesium goal> 2  Hyperglycemia -Sensitive SSI  Diarrhea -Patient unaware of the BMs -Would also account for his hypokalemia. -Imodium -11/16 discontinue Sorbitol, bisacodyl, MiraLAX   Failure to thrive -Patient unable to care for himself at home.  Discharged on 11/3 and readmitted on 11/8.  Patient now with worsening acute on CKD stage V.  Clear that if patient goes home instead of to a SNF is at high risk for complete renal failure of his transplanted kidney. -11/16 requested placement of CorTrak tube  -11/14 palliative care; patient with multiple medical problems was discharged a week ago for Covid pneumonia return within a week with acute respiratory failure with hypoxia and increasing renal failure (renal transplant patient).  Appears patient may not survive this hospitalization discuss hospice; recommendation continue with DNR    DVT prophylaxis: 11/17 restart subcu heparin per pharmacy Code Status: DNR Family Communication: 11/16 left message spoke with Lyanne Co (niece) counseled her on plan of care answered all questions   disposition Plan: SNF?    Consultants:  Nephrology Palliative Care    Procedures/Significant Events:  11/8 PCXR; bilateral patchy areas of consolidation may represent multifocal pneumonia vs atypical/viral infectious process. 11/12 PCXR-worsening multifocal airspace opacities.  -New small to moderate-sized bilateral pleural effusions. 11/13 transfuse 1 unit PRBC 11/14 PCXR;-no significant interval change in AP portable examination with diffuse interstitial and heterogeneous airspace opacity.  -Findings remain consistent with multifocal infection and/or edema   I have personally reviewed and interpreted all radiology studies and my findings are as above.  VENTILATOR SETTINGS: Room air SPO2= 100%     Cultures 8/11 urine positive for staph epi 11/13 blood RIGHT antecubital NGTD 11/13 blood RIGHT hand NGTD 11/13 urine pending     Antimicrobials: Anti-infectives (From admission, onward)   Start     Stop   01/17/19 1300  remdesivir 100 mg in sodium chloride 0.9 % 250 mL IVPB     01/22/19 0959   01/16/19 1800  vancomycin (VANCOCIN) 500 mg in sodium chloride 0.9 % 100 mL IVPB     01/16/19 2323   01/13/19 1900  vancomycin (VANCOCIN) IVPB 1000 mg/200 mL premix     01/13/19 2200   01/12/19 1800  ceFEPIme (MAXIPIME) 1 g in sodium chloride 0.9 % 100 mL IVPB     01/17/19 2359   01/12/19 1600  sulfamethoxazole-trimethoprim (BACTRIM) 400-80 MG per tablet 1 tablet    Note to Pharmacy: Take one tablet by mouth  on Monday, Wednesday and fridays.         01/11/19 0830  vancomycin (VANCOCIN) IVPB 1000 mg/200 mL premix  Status:  Discontinued     01/11/19 0901   01/11/19 0830  ceFEPIme (MAXIPIME) 2 g in sodium chloride 0.9 % 100 mL IVPB     01/11/19 0952       Devices    LINES / TUBES:      Continuous Infusions: . sodium chloride    . dextrose 125 mL/hr at 01/20/19 0724  . remdesivir 100 mg in NS 250 mL Stopped (01/19/19 1012)     Objective: Vitals:   01/19/19 2244 01/20/19 0104 01/20/19 0400  01/20/19 0717  BP: (!) 168/87 (!) 152/73  (!) 174/75  Pulse: 80   81  Resp: (!) 28   (!) 31  Temp:  97.8 F (36.6 C) (!) 96.8 F (36 C) 98.1 F (36.7 C)  TempSrc:  Oral Axillary Oral  SpO2: 100%   100%  Weight:   47.2 kg   Height:        Intake/Output Summary (Last 24 hours) at 01/20/2019 [id number] Last data filed at 01/20/2019 [id number] Gross per 24 hour  Intake 2648.27 ml  Output 800 ml  Net 1848.27 ml   Filed Weights   01/18/19 0500 01/19/19 0415 01/20/19 0400  Weight: 47 kg 45.6 kg 47.2 kg   Physical Exam:  General: Alert, but continues to be somewhat somnolent, answers simple questions and follows commands, no acute respiratory distress Eyes: negative scleral hemorrhage, negative anisocoria, negative icterus ENT: Negative Runny nose, negative gingival bleeding, Neck:  Negative scars, masses, torticollis, lymphadenopathy, JVD Lungs: Clear to auscultation bilaterally without wheezes or crackles Cardiovascular: Regular rate and rhythm without murmur gallop or rub normal S1 and S2 Abdomen: negative abdominal pain, nondistended, positive soft, bowel sounds, no rebound, no ascites, no appreciable mass Extremities: No significant cyanosis, clubbing, or edema bilateral lower extremities Skin: Negative rashes, lesions, ulcers Psychiatric:  Negative depression, negative anxiety, negative fatigue, negative mania  Central nervous system:  Cranial nerves II through XII intact, tongue/uvula midline, all extremities muscle strength 5/5, sensation intact throughout, positive dysarthria, negative expressive aphasia, negative receptive aphasia.   Data Reviewed: Care during the described time interval was provided by me .  I have reviewed this patient's available data, including medical history, events of note, physical examination, and all test results as part of my evaluation.   CBC: Recent Labs  Lab 01/15/19 0000  01/16/19 0020 01/16/19 0535 01/17/19 0027 01/18/19 0215 01/19/19 0450   WBC 14.5*  --  14.8*  --  14.8* 8.7 8.8  NEUTROABS 13.4*  --  13.5*  --  13.6* 7.7 7.6  HGB 7.4*   < > 7.1* 8.8* 9.5* 9.3* 10.3*  HCT 21.8*   < > 21.5* 26.0* 28.2* 27.7* 30.5*  MCV 85.5  --  87.8  --  84.7 85.2 84.3  PLT 123*  --  132*  --  148* 146* 154   < > = values in this interval not displayed.   Basic Metabolic Panel: Recent Labs  Lab 01/15/19 0000 01/15/19 1540 01/16/19 0020 01/16/19 0535 01/17/19 0027 01/18/19 0215 01/19/19 0450  NA 138  --  144 147* 150* 150* 153*  K 3.3* 4.4 4.2 3.4* 2.9* 3.2* 4.2  CL 105  --  112*  --  114* 112* 120*  CO2 21*  --  19*  --  21* 22 21*  GLUCOSE 148*  --  147*  --  90 236* 206*  BUN 86*  --  93*  --  100* 128* 142*  CREATININE 4.83*  --  4.89*  --  5.12* 5.79* 5.78*  CALCIUM 7.4*  --  8.0*  --  8.3* 9.0 8.9  MG 2.0 1.9 1.9  --  1.8 2.2 2.2  PHOS 2.1*  --  3.9  --  4.0 4.5 5.4*   GFR: Estimated Creatinine Clearance: 7.8 mL/min (A) (by C-G formula based on SCr of 5.78 mg/dL (H)). Liver Function Tests: Recent Labs  Lab 01/15/19 0000 01/16/19 0020 01/17/19 0027 01/18/19 0215 01/19/19 0450  AST 60* 53* 43* 45* 25  ALT 38 37 31 41 31  ALKPHOS 66 65 67 94 89  BILITOT 0.6 1.2 1.1 0.6 0.7  PROT 6.4* 7.0 7.2 7.9 8.1  ALBUMIN 1.6* 2.5* 2.5* 2.4* 2.6*   No results for input(s): LIPASE, AMYLASE in the last 168 hours. No results for input(s): AMMONIA in the last 168 hours. Coagulation Profile: No results for input(s): INR, PROTIME in the last 168 hours. Cardiac Enzymes: No results for input(s): CKTOTAL, CKMB, CKMBINDEX, TROPONINI in the last 168 hours. BNP (last 3 results) No results for input(s): PROBNP in the last 8760 hours. HbA1C: No results for input(s): HGBA1C in the last 72 hours. CBG: Recent Labs  Lab 01/19/19 1147 01/19/19 1541 01/19/19 2042 01/20/19 0448 01/20/19 0715  GLUCAP 77 145* 243* 177* 143*   Lipid Profile: No results for input(s): CHOL, HDL, LDLCALC, TRIG, CHOLHDL, LDLDIRECT in the last 72 hours.  Thyroid Function Tests: No results for input(s): TSH, T4TOTAL, FREET4, T3FREE, THYROIDAB in the last 72 hours. Anemia Panel: Recent Labs    01/18/19 0215 01/19/19 0450  FERRITIN 5,143* 5,344*   Urine analysis:    Component Value Date/Time   COLORURINE AMBER (A) 01/11/2019 1455   APPEARANCEUR CLOUDY (A) 01/11/2019 1455   LABSPEC 1.013 01/11/2019 1455   PHURINE 5.0 01/11/2019 1455   GLUCOSEU 50 (A) 01/11/2019 1455   HGBUR MODERATE (A) 01/11/2019 1455   BILIRUBINUR NEGATIVE 01/11/2019 1455   KETONESUR NEGATIVE 01/11/2019 1455   PROTEINUR 100 (A) 01/11/2019 1455   UROBILINOGEN 1.0 12/20/2014 1252   NITRITE NEGATIVE 01/11/2019 1455   LEUKOCYTESUR NEGATIVE 01/11/2019 1455   Sepsis Labs: @LABRCNTIP (procalcitonin:4,lacticidven:4)  ) Recent Results (from the past 240 hour(s))  Blood Culture (routine x 2)     Status: None   Collection Time: 01/11/19  8:00 AM   Specimen: BLOOD LEFT FOREARM  Result Value Ref Range Status   Specimen Description BLOOD LEFT FOREARM  Final   Special Requests   Final    BOTTLES DRAWN AEROBIC AND ANAEROBIC Blood Culture adequate volume   Culture   Final    NO GROWTH 5 DAYS Performed at [facility], [place] [address], [place], [place] [address]    Report Status 01/16/2019 FINAL  Final  Blood Culture (routine x 2)     Status: None   Collection Time: 01/11/19  8:15 AM   Specimen: BLOOD RIGHT HAND  Result Value Ref Range Status   Specimen Description BLOOD RIGHT HAND  Final   Special Requests   Final    BOTTLES DRAWN AEROBIC ONLY Blood Culture adequate volume   Culture   Final    NO GROWTH 5 DAYS Performed at [facility], [place] [address], [place], [place] [address]    Report Status 01/16/2019 FINAL  Final  Culture, Urine     Status: Abnormal   Collection Time: 01/11/19  2:55  PM   Specimen: Urine, Clean Catch  Result Value Ref Range Status   Specimen Description URINE, CLEAN CATCH  Final   Special Requests   Final    NONE Performed at  [facility], [address] [place], [place] [address]    Culture 40,000 COLONIES/mL STAPHYLOCOCCUS EPIDERMIDIS (A)  Final   Report Status 01/13/2019 FINAL  Final   Organism ID, Bacteria STAPHYLOCOCCUS EPIDERMIDIS (A)  Final      Susceptibility   Staphylococcus epidermidis - MIC*    CIPROFLOXACIN >=8 RESISTANT Resistant     GENTAMICIN <=0.5 SENSITIVE Sensitive     NITROFURANTOIN <=16 SENSITIVE Sensitive     OXACILLIN >=4 RESISTANT Resistant     TETRACYCLINE 2 SENSITIVE Sensitive     VANCOMYCIN 1 SENSITIVE Sensitive     TRIMETH/SULFA 160 RESISTANT Resistant     CLINDAMYCIN >=8 RESISTANT Resistant     RIFAMPIN <=0.5 SENSITIVE Sensitive     Inducible Clindamycin NEGATIVE Sensitive     * 40,000 COLONIES/mL STAPHYLOCOCCUS EPIDERMIDIS  Culture, blood (routine x 2)     Status: None (Preliminary result)   Collection Time: 01/16/19  7:45 AM   Specimen: BLOOD  Result Value Ref Range Status   Specimen Description   Final    BLOOD RIGHT ANTECUBITAL Performed at [facility], [place] [address] [place], [place] [address]    Special Requests   Final    BOTTLES DRAWN AEROBIC ONLY Blood Culture adequate volume Performed at [place] [address], [place], [place] [address]    Culture   Final    NO GROWTH 3 DAYS Performed at [facility], [place] [address], [place], [place] [address]    Report Status PENDING  Incomplete  Culture, blood (routine x 2)     Status: None (Preliminary result)   Collection Time: 01/16/19  7:50 AM   Specimen: BLOOD  Result Value Ref Range Status   Specimen Description   Final    BLOOD RIGHT HAND Performed at [place] [address] [place], [place] [address]    Special Requests   Final    BOTTLES DRAWN AEROBIC ONLY Blood Culture adequate volume Performed at [place] [address] [place], [place] [address]    Culture   Final    NO GROWTH 3 DAYS Performed at  [facility], [place] [address], [place], [place] [address]    Report Status PENDING  Incomplete         Radiology Studies: No results found.      Scheduled Meds: . amLODipine  10 mg Oral Daily  . aspirin EC  81 mg Oral Daily  . cloNIDine  0.2 mg Oral BID  . dexamethasone (DECADRON) injection  6 mg Intravenous Q24H  . feeding supplement (ENSURE ENLIVE)  237 mL Oral TID BM  . feeding supplement (NEPRO CARB STEADY)  1,000 mL Per Tube Q24H  . free water  300 mL Per Tube Q3H  . heparin  5,000 Units Subcutaneous Q8H  . insulin aspart  0-9 Units Subcutaneous Q4H  . Ipratropium-Albuterol  1 puff Inhalation Q6H  . mouth rinse  15 mL Mouth Rinse BID  . metoprolol tartrate  50 mg Oral BID  . mycophenolate  500 mg Oral BID  . pantoprazole (PROTONIX) IV  40 mg Intravenous Q12H  . sodium chloride flush  3 mL Intravenous Q12H  . sulfamethoxazole-trimethoprim  1 tablet Oral Q M,W,F  . tacrolimus  4 mg Oral BID   Continuous Infusions: . sodium chloride    . dextrose 125 mL/hr at 01/20/19 0724  . remdesivir 100 mg in NS 250 mL Stopped (01/19/19 1012)     LOS: 9 days   The patient is critically ill with multiple organ systems failure and requires high complexity decision making for assessment and support, frequent evaluation and titration of therapies, application of advanced monitoring technologies and extensive interpretation of multiple databases. Critical Care Time devoted to patient care services described in this note  Time spent: 40 minutes     Jeffie Widdowson, Geraldo Docker, MD [facility] Pager [phone]  If 7PM-7AM, please contact night-coverage [email or web address] Password [facility] 01/20/2019, 9:24 AM

## 2019-01-20 NOTE — Progress Notes (Addendum)
Initial Nutrition Assessment  DOCUMENTATION CODES:   Underweight  INTERVENTION:   Begin TF after Cortrak placement:   Vital AF 1.2 at 55 ml/h (1320 ml per day)   Provides 1584 kcal, 99 gm protein, 1071 ml free water daily  NUTRITION DIAGNOSIS:   Increased nutrient needs related to (COVID-19 PNA) as evidenced by estimated needs.  Ongoing   GOAL:   Patient will meet greater than or equal to 90% of their needs  Progressing with TF initiation  MONITOR:   PO intake, Supplement acceptance, Weight trends  REASON FOR ASSESSMENT:   Ventilator, Consult Enteral/tube feeding initiation and management  ASSESSMENT:   Pt with PMH of HTN and stage V CKD s/p renal transplantation presenting with AMS.  He was admitted to [facility] from 10/28-11/3 for COVID-19-associated PNA with acute respiratory failure with hypoxia.  He completed a course of Remdesivir and steroids, and he was discharged home on 11/3, patient was brought back to ED, given failure to thrive, dehydration and altered mental status, he was noted to have significant dehydration with hypernatremia and renal failure, he was transferred to [facility] for further management.  Patient was transferred to [facility] MICU 11/17 for initiation of HD and placement of a Cortrak tube to receive free water, tube feeding, and medications.  IVF increased yesterday. Sodium and BUN improved today. UOP since yesterday ~1 L. I/O +8 L since admission.   Medications reviewed and include: decadron, novolog.  Labs reviewed: sodium 150 (H), BUN 122 (H), creatinine 5.66 (H), phosphorus 5.3 (H), potassium 3.8 WNL CBG's: [phone]   HD is being initiated today. Patient has been eating very poorly since admission. NGT placement has been unsuccessful, so Cortrak placement to be attempted today.  Per review of usual weights, patient has lost > 12% of usual weight within the past month. Some weight fluctuations may be related to acute on chronic renal  dysfunction. Patient is underweight, BMI = 17.3. Suspect patient meets criteria for malnutrition.  Palliative Care team following with plans to treat the treatable but no CPR or intubation.   Diet Order:   Diet Order            Diet renal/carb modified with fluid restriction Diet-HS Snack? Nothing; Fluid restriction: 1200 mL Fluid; Room service appropriate? Yes; Fluid consistency: Thin  Diet effective now              EDUCATION NEEDS:   Not appropriate for education at this time  Skin:  Skin Assessment: Reviewed RN Assessment  Last BM:  11/16 type 6  Height:   Ht Readings from Last 1 Encounters:  01/12/19 5\' 5"  (1.651 m)    Weight:   Wt Readings from Last 1 Encounters:  01/20/19 47.2 kg   01/12/19 43.7 kg    Ideal Body Weight:  61.8 kg  BMI:  Body mass index is 17.31 kg/m.  Estimated Nutritional Needs:   Kcal:  1500-1700  Protein:  75-90 grams  Fluid:  > 1.5 L/day   Molli Barrows, RD, LDN, [place] Pager [phone] After Hours Pager [phone]

## 2019-01-20 NOTE — Progress Notes (Signed)
   01/19/19 2244  Provider Notification  Provider Name/Title Shanon Brow  Date Provider Notified 01/19/19  Time Provider Notified 2245  Notification Type Page  Notification Reason Other (Comment) (95.1 rectal and mews red score)  RR, low temp

## 2019-01-20 NOTE — Consult Note (Signed)
   [facility] CM Inpatient Consult   01/20/2019  Rodney Shamburg October 08, 1945 [id number]   Patient screened for extreme high risk score for unplanned readmission score of 34%  and for less than 30 day re-hospitalizations in the Medicare Next Gen ACO. Patient also with a long length of stay.   Review of patient's medical record reveals patient's HPI from 01/11/2019 reveals per MD notes which includes but note limited to, from  Dr. Karmen Bongo progress notes: Francisco Williamson is a 73 y.o. male with medical history significant of HTN and stage V CKD s/p renal transplantation presenting with AMS.  He was admitted to [facility] from 10/28-11/3 for COVID-19-associated PNA with acute respiratory failure with hypoxia.  He completed a course of Remdesivir and steroids.  He is unable to provide history at this time.  Also reviewed inpatient palliative care notes.  Primary Care Provider is  [facility] - Dr. Corliss Parish   Plan: Current disposition is being recommended for a skilled nursing facility.  Will follow for progress and needs as appropriate.  Please place a [facility] Care Management consult as appropriate and for questions contact:   Natividad Brood, RN BSN [facility] Liaison  [phone] business mobile phone Toll free office [phone]  Fax number: [phone] [email or web address]

## 2019-01-20 NOTE — Progress Notes (Signed)
PT Cancellation Note  Patient Details Name: Francisco Williamson MRN: [id number] DOB: January 13, 1946   Cancelled Treatment:    Reason Eval/Treat Not Completed: Other (comment). Pt transferring to [facility] for HD.   Shary Decamp [facility] 01/20/2019, 9:59 AM [place] Pager [phone] Office [phone]

## 2019-01-20 NOTE — Progress Notes (Signed)
Attempted to call pt niece on house and cell phone, no answer. Also called Mattie, no answer. Pt transferred to [facility] cone via care link. Report called by night shift RN

## 2019-01-20 NOTE — Procedures (Signed)
Cortrak  Person Inserting Tube:  Zoanne Newill M, RD Tube Type:  Cortrak - 43 inches Tube Location:  Left nare Initial Placement:  Stomach Secured by: Bridle Technique Used to Measure Tube Placement:  Documented cm marking at nare/ corner of mouth Cortrak Secured At:  65 cm Procedure Comments:  Cortrak Tube Team Note:  Consult received to place a Cortrak feeding tube.   No x-ray is required. RN may begin using tube.   If the tube becomes dislodged please keep the tube and contact the Cortrak team at [email or web address] (password [email or web address]) for replacement.  If after hours and replacement cannot be delayed, place a NG tube and confirm placement with an abdominal x-ray.     Melena Hayes, MS, RD, LDN, CNSC Inpatient Clinical Dietitian Pager # [phone] After hours/weekend pager # [phone]      

## 2019-01-20 NOTE — Progress Notes (Signed)
Spoke to patient's brother and other family members on speaker phone. Pt said "yes" for okay to update family member. Updated on plan for transfer to [place] for possible dialysis and coretrak placement for nutrition- stated understanding, "do whatever you have to do, he's all we got". Family calm and supportive. Pt's brother states he will call tomorrow night for updates.

## 2019-01-20 NOTE — Plan of Care (Signed)
  Problem: Safety: Goal: Ability to remain free from injury will improve Outcome: Progressing   Problem: Skin Integrity: Goal: Risk for impaired skin integrity will decrease Outcome: Progressing   Problem: Education: Goal: Knowledge of General Education information will improve Description: Including pain rating scale, medication(s)/side effects and non-pharmacologic comfort measures Outcome: Not Progressing   Problem: Activity: Goal: Risk for activity intolerance will decrease Outcome: Not Progressing   Problem: Nutrition: Goal: Adequate nutrition will be maintained Outcome: Not Progressing

## 2019-01-20 NOTE — Progress Notes (Signed)
   01/19/19 2302  Provider Notification  Provider Name/Title Dr Shanon Brow  Date Provider Notified 01/19/19  Time Provider Notified 2302  Notification Type Page  Notification Reason Other (Comment) (temp/RR)  unable to get patient to take oral intake this shift. Pt closing mouth, shakes head side-to-side "no". Pt awakens to voice, whisper weak voice "Yes" "no" at times, able to state name and and follow some simple commands. HTN noted, no prn iv antihypertensives noted.

## 2019-01-20 NOTE — Progress Notes (Signed)
Pharmacy consulted for heparin for VTE prophylaxis.   Per algorithm, patient is in the ICU, low body weight and CrCl < 30 ml/min, the algorithm recommends heparin [place] 7500 units bid or tid. Patient has a history of GI bleed, with evidence of melena requiring blood transfusion during this admission. Patient is currently on heparin 5000 units [place] tid. He is tolerating this dose and frequency. I will refrain from increasing him to 7500 units tid given history of GI bleed during this admit. D-dimer peaked at 5.7, now down to 2.7.  Plan Continue heparin  5000 units tid

## 2019-01-20 NOTE — Progress Notes (Signed)
Subjective:  NGT had failed but I did increase his rate of d5W per his IV.  This AM his sodium and his BUN appear better as well has his crt-   Made a liter of urine.  Now in 31M   Objective Vital signs in last 24 hours: Vitals:   01/20/19 0400 01/20/19 0717 01/20/19 0952 01/20/19 1107  BP:  (!) 174/75 (!) 157/65   Pulse:  81    Resp:  (!) 31    Temp: (!) 96.8 F (36 C) 98.1 F (36.7 C)  97.8 F (36.6 C)  TempSrc: Axillary Oral  Oral  SpO2:  100%    Weight: 47.2 kg     Height:       Weight change: 1.588 kg  Intake/Output Summary (Last 24 hours) at 01/20/2019 1120 Last data filed at 01/20/2019 1000 Gross per 24 hour  Intake 3046.01 ml  Output 1075 ml  Net 1971.01 ml    Assessment/ Plan: Pt is a 73 y.o. yo male s/p renal transplant but with CKD at baseline crt in the 3's who was admitted on 01/11/2019 with weakness after COVID treatment  Assessment/Plan: 1. Renal-  History of CKD and renal transplant.  A on chronic change due to illness and volume/free water depletion-  attempting to replace- has been successful to a certain degree.  I would want to correct free water deficit before I would do something like dialysis for his uremia.  Will talk with nursing to see if needs NGT or if more alert can take in water PO and continue d5 at 3 liters a day.  No absolute needs for HD at this time but if needs has a patent AVF and because is at [facility] can do IHD  2. S/p renal transplant-  Is on prograf and very low dose cellcept as well as decadron   3. Anemia- is on procrit normally for anemia-  Supportive therapy   4. HTN/volume-  Hypertensive but with free water deficit.   Give free water  (inc d5W and give free water per tube or as PO as tolerates ) also continue with antihypertensivies  Louis Meckel    Labs: Basic Metabolic Panel: Recent Labs  Lab 01/18/19 0215 01/19/19 0450 01/20/19 0442  NA 150* 153* 150*  K 3.2* 4.2 3.8  CL 112* 120* 116*  CO2 22 21* 19*   GLUCOSE 236* 206* 164*  BUN 128* 142* 122*  CREATININE 5.79* 5.78* 5.66*  CALCIUM 9.0 8.9 8.5*  PHOS 4.5 5.4* 5.3*   Liver Function Tests: Recent Labs  Lab 01/18/19 0215 01/19/19 0450 01/20/19 0442  AST 45* 25 22  ALT 41 31 24  ALKPHOS 94 89 81  BILITOT 0.6 0.7 0.8  PROT 7.9 8.1 7.4  ALBUMIN 2.4* 2.6* 2.3*   No results for input(s): LIPASE, AMYLASE in the last 168 hours. No results for input(s): AMMONIA in the last 168 hours. CBC: Recent Labs  Lab 01/16/19 0020  01/17/19 0027 01/18/19 0215 01/19/19 0450 01/20/19 0442  WBC 14.8*  --  14.8* 8.7 8.8 7.4  NEUTROABS 13.5*  --  13.6* 7.7 7.6 6.4  HGB 7.1*   < > 9.5* 9.3* 10.3* 9.1*  HCT 21.5*   < > 28.2* 27.7* 30.5* 27.6*  MCV 87.8  --  84.7 85.2 84.3 85.7  PLT 132*  --  148* 146* 154 137*   < > = values in this interval not displayed.   Cardiac Enzymes: No results for input(s): CKTOTAL, CKMB,  CKMBINDEX, TROPONINI in the last 168 hours. CBG: Recent Labs  Lab 01/19/19 1541 01/19/19 2042 01/20/19 0448 01/20/19 0715 01/20/19 1109  GLUCAP 145* 243* 177* 143* 68*    Iron Studies:  Recent Labs    01/20/19 0442  FERRITIN 2,934*   Studies/Results: No results found. Medications: Infusions: . sodium chloride    . dextrose 125 mL/hr at 01/20/19 1116  . remdesivir 100 mg in NS 250 mL Stopped (01/20/19 0959)    Scheduled Medications: . amLODipine  10 mg Oral Daily  . aspirin EC  81 mg Oral Daily  . Chlorhexidine Gluconate Cloth  6 each Topical Daily  . cloNIDine  0.2 mg Oral BID  . dexamethasone (DECADRON) injection  6 mg Intravenous Q24H  . feeding supplement (ENSURE ENLIVE)  237 mL Oral TID BM  . feeding supplement (NEPRO CARB STEADY)  1,000 mL Per Tube Q24H  . free water  300 mL Per Tube Q3H  . heparin  5,000 Units Subcutaneous Q8H  . insulin aspart  0-9 Units Subcutaneous Q4H  . Ipratropium-Albuterol  1 puff Inhalation Q6H  . mouth rinse  15 mL Mouth Rinse BID  . metoprolol tartrate  50 mg Oral BID  .  mycophenolate  500 mg Oral BID  . pantoprazole (PROTONIX) IV  40 mg Intravenous Q12H  . sodium chloride flush  3 mL Intravenous Q12H  . sulfamethoxazole-trimethoprim  1 tablet Oral Q M,W,F  . tacrolimus  4 mg Oral BID    have reviewed scheduled and prn medications.  Physical Exam: PE not performed as patient in isolation for COVD in order to preserve PPE and to avoid exposure to more providers.  Case discussed with CCM  01/20/2019,11:20 AM  LOS: 9 days

## 2019-01-20 NOTE — Progress Notes (Signed)
Hypoglycemic Event  CBG: 68  Treatment: 4 oz juice/soda  Symptoms: Shaky  Follow-up CBG: Time: 1205 CBG Result: 98  Possible Reasons for Event: Inadequate meal intake  Comments/MD notified: Will continue to monitor. Back on dextrose 5% at 125.    Francisco Williamson A

## 2019-01-20 NOTE — Progress Notes (Signed)
Patient is chart review.  Patient arrived to [place].  He denies any acute complaints.  No shortness of breath.  Patient tolerated receiving core track NG tube.  Physical exam notable for thin, cachectic elderly male, with core track feeding tube in place, normal respiratory effort on room air with 100% oxygen saturation.  Normal blood pressure.  Following commands, left eye blind, sleepy but easily arousable, oriented to self, able to state he is in [place], following commands.  Creatinine currently 5.66 sodium improved from 153 to 150. Planning to continue fluid resuscitation with D5 and free water to correct hypernatremia, currently does not meet criteria to start dialysis per nephrology.  Remaining problem per progress note addressed by Dr. [name] today. [place] hospitalists

## 2019-01-20 NOTE — Progress Notes (Addendum)
Report called to Nate RN on [facility] [place] ICU for report at this time. Will update when with time of arrival when transport is set-up.

## 2019-01-21 DIAGNOSIS — N185 Chronic kidney disease, stage 5: Secondary | ICD-10-CM

## 2019-01-21 DIAGNOSIS — N170 Acute kidney failure with tubular necrosis: Secondary | ICD-10-CM

## 2019-01-21 LAB — FERRITIN: Ferritin: 1037 ng/mL — ABNORMAL HIGH (ref 24–336)

## 2019-01-21 LAB — CBC WITH DIFFERENTIAL/PLATELET
Abs Immature Granulocytes: 0.08 10*3/uL — ABNORMAL HIGH (ref 0.00–0.07)
Basophils Absolute: 0 10*3/uL (ref 0.0–0.1)
Basophils Relative: 0 %
Eosinophils Absolute: 0 10*3/uL (ref 0.0–0.5)
Eosinophils Relative: 0 %
HCT: 22.9 % — ABNORMAL LOW (ref 39.0–52.0)
Hemoglobin: 8 g/dL — ABNORMAL LOW (ref 13.0–17.0)
Immature Granulocytes: 1 %
Lymphocytes Relative: 8 %
Lymphs Abs: 0.6 10*3/uL — ABNORMAL LOW (ref 0.7–4.0)
MCH: 29 pg (ref 26.0–34.0)
MCHC: 34.9 g/dL (ref 30.0–36.0)
MCV: 83 fL (ref 80.0–100.0)
Monocytes Absolute: 0.3 10*3/uL (ref 0.1–1.0)
Monocytes Relative: 3 %
Neutro Abs: 6.7 10*3/uL (ref 1.7–7.7)
Neutrophils Relative %: 88 %
Platelets: 119 10*3/uL — ABNORMAL LOW (ref 150–400)
RBC: 2.76 MIL/uL — ABNORMAL LOW (ref 4.22–5.81)
RDW: 17.1 % — ABNORMAL HIGH (ref 11.5–15.5)
WBC: 7.7 10*3/uL (ref 4.0–10.5)
nRBC: 0 % (ref 0.0–0.2)

## 2019-01-21 LAB — CULTURE, BLOOD (ROUTINE X 2)
Culture: NO GROWTH
Culture: NO GROWTH
Special Requests: ADEQUATE
Special Requests: ADEQUATE

## 2019-01-21 LAB — MAGNESIUM: Magnesium: 1.7 mg/dL (ref 1.7–2.4)

## 2019-01-21 LAB — GLUCOSE, CAPILLARY
Glucose-Capillary: 171 mg/dL — ABNORMAL HIGH (ref 70–99)
Glucose-Capillary: 194 mg/dL — ABNORMAL HIGH (ref 70–99)
Glucose-Capillary: 163 mg/dL — ABNORMAL HIGH (ref 70–99)
Glucose-Capillary: 216 mg/dL — ABNORMAL HIGH (ref 70–99)
Glucose-Capillary: 305 mg/dL — ABNORMAL HIGH (ref 70–99)

## 2019-01-21 LAB — COMPREHENSIVE METABOLIC PANEL
ALT: 19 U/L (ref 0–44)
AST: 21 U/L (ref 15–41)
Albumin: 1.8 g/dL — ABNORMAL LOW (ref 3.5–5.0)
Alkaline Phosphatase: 71 U/L (ref 38–126)
Anion gap: 12 (ref 5–15)
BUN: 127 mg/dL — ABNORMAL HIGH (ref 8–23)
CO2: 16 mmol/L — ABNORMAL LOW (ref 22–32)
Calcium: 7.6 mg/dL — ABNORMAL LOW (ref 8.9–10.3)
Chloride: 111 mmol/L (ref 98–111)
Creatinine, Ser: 5.82 mg/dL — ABNORMAL HIGH (ref 0.61–1.24)
GFR calc Af Amer: 10 mL/min — ABNORMAL LOW (ref >60)
GFR calc non Af Amer: 9 mL/min — ABNORMAL LOW (ref >60)
Glucose, Bld: 232 mg/dL — ABNORMAL HIGH (ref 70–99)
Potassium: 3.7 mmol/L (ref 3.5–5.1)
Sodium: 139 mmol/L (ref 135–145)
Total Bilirubin: 1 mg/dL (ref 0.3–1.2)
Total Protein: 6.2 g/dL — ABNORMAL LOW (ref 6.5–8.1)

## 2019-01-21 LAB — C-REACTIVE PROTEIN: CRP: 11.4 mg/dL — ABNORMAL HIGH (ref <1.0)

## 2019-01-21 LAB — PHOSPHORUS: Phosphorus: 5.6 mg/dL — ABNORMAL HIGH (ref 2.5–4.6)

## 2019-01-21 LAB — HEMOGLOBIN AND HEMATOCRIT, BLOOD
HCT: 23 % — ABNORMAL LOW (ref 39.0–52.0)
Hemoglobin: 7.8 g/dL — ABNORMAL LOW (ref 13.0–17.0)

## 2019-01-21 LAB — D-DIMER, QUANTITATIVE: D-Dimer, Quant: 2.25 ug/mL-FEU — ABNORMAL HIGH (ref 0.00–0.50)

## 2019-01-21 MED ORDER — STERILE WATER FOR INJECTION IV SOLN
INTRAVENOUS | Status: DC
  Administered 2019-01-21 – 2019-01-23 (×4): via INTRAVENOUS
  Filled 2019-01-21 (×5): qty 850

## 2019-01-21 MED ORDER — PREDNISONE 10 MG PO TABS
10.0000 mg | ORAL_TABLET | Freq: Every day | ORAL | Status: DC
  Administered 2019-01-22 – 2019-01-23 (×2): 10 mg via ORAL
  Filled 2019-01-21 (×2): qty 1

## 2019-01-21 MED ORDER — DARBEPOETIN ALFA 150 MCG/0.3ML IJ SOSY
150.0000 ug | PREFILLED_SYRINGE | INTRAMUSCULAR | Status: DC
  Administered 2019-01-21 – 2019-02-04 (×3): 150 ug via SUBCUTANEOUS
  Filled 2019-01-21 (×3): qty 0.3

## 2019-01-21 MED ORDER — FREE WATER
200.0000 mL | Freq: Three times a day (TID) | Status: DC
  Administered 2019-01-21 – 2019-02-03 (×32): 200 mL

## 2019-01-21 MED ORDER — CHLORHEXIDINE GLUCONATE CLOTH 2 % EX PADS
6.0000 | MEDICATED_PAD | Freq: Every day | CUTANEOUS | Status: DC
  Administered 2019-01-22: 6 via TOPICAL

## 2019-01-21 NOTE — Progress Notes (Signed)
Nutrition Follow-up  DOCUMENTATION CODES:   Severe malnutrition in context of chronic illness, Underweight  INTERVENTION:   Continue TF via Cortrak tube:   Vital AF 1.2 at 55 ml/h (1320 ml per day).   Provides 1584 kcal, 99 gm protein, 1071 ml free water daily.   Continue to monitor magnesium, potassium, and phosphorus. MD to replace as needed. Patient is at risk for refeeding syndrome due to severe malnutrition.  NUTRITION DIAGNOSIS:   Severe Malnutrition related to chronic illness(CKD) as evidenced by severe muscle depletion, severe fat depletion, percent weight loss.  Ongoing  GOAL:   Patient will meet greater than or equal to 90% of their needs  Met with TF  MONITOR:   TF tolerance, Labs, PO intake, Skin, I & O's  REASON FOR ASSESSMENT:   Ventilator, Consult Enteral/tube feeding initiation and management  ASSESSMENT:   Pt with PMH of HTN and stage V CKD s/p renal transplantation presenting with AMS.  He was admitted to [facility] from 10/28-11/3 for COVID-19-associated PNA with acute respiratory failure with hypoxia.  He completed a course of Remdesivir and steroids, and he was discharged home on 11/3, patient was brought back to ED, given failure to thrive, dehydration and altered mental status, he was noted to have significant dehydration with hypernatremia and renal failure, he was transferred to [facility] for further management.  Patient is receiving Vital AF 1.2 at 55 ml/h via Cortrak tube that was placed 11/17. He is tolerating TF well.  Patient continues to consume 0% of meals.   Palliative Care team is discussing plan of care with patient's family. Plans for a HD treatment tomorrow and likely Friday too.   Patient meets criteria for severe PCM in the context of chronic illness (CKD) as evidenced by >12% weight loss within 1 month and severed depletion of muscle and subcutaneous fat mass.  Labs and medications reviewed. CBG's: [phone]  NUTRITION -  FOCUSED PHYSICAL EXAM:    Most Recent Value  Orbital Region  Severe depletion  Upper Arm Region  Severe depletion  Thoracic and Lumbar Region  Severe depletion  Buccal Region  Severe depletion  Temple Region  Severe depletion  Clavicle Bone Region  Severe depletion  Clavicle and Acromion Bone Region  Severe depletion  Scapular Bone Region  Severe depletion  Dorsal Hand  Severe depletion  Patellar Region  Severe depletion  Anterior Thigh Region  Severe depletion  Posterior Calf Region  Severe depletion       Diet Order:   Diet Order    None      EDUCATION NEEDS:   Not appropriate for education at this time  Skin:  Skin Assessment: Reviewed RN Assessment  Last BM:  11/18  Height:   Ht Readings from Last 1 Encounters:  01/12/19 5' 5" (1.651 m)    Weight:   Wt Readings from Last 1 Encounters:  01/21/19 46 kg    Ideal Body Weight:  61.8 kg  BMI:  Body mass index is 16.88 kg/m.  Estimated Nutritional Needs:   Kcal:  1500-1700  Protein:  75-90 grams  Fluid:  > 1.5 L/day    Kimberly Harris, RD, LDN, CNSC Pager [phone] After Hours Pager [phone]  

## 2019-01-21 NOTE — Progress Notes (Signed)
Physical Therapy Treatment Patient Details Name: Francisco Williamson MRN: [id number] DOB: 22-Sep-1945 Today's Date: 01/21/2019    History of Present Illness 73 yo male presenting with AMS and failure to thrive, renal failure.Pt was admitted to [facility] from 10/28-11/3 for COVID + and PNA. 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    PT Comments    PT called into room with OT due to pt's decreased strength and endurance today. Assisted with transfer from [facility], ambulation to sink and transfer back to chair.  Pt with increase in RR and decreased in O2 saturation with standing at sink (see General Comments). Goals have been adjusted for decline in mobility. PT will continue to follow acutely.   Follow Up Recommendations  SNF     Equipment Recommendations  None recommended by PT       Precautions / Restrictions Precautions Precautions: Fall Restrictions Weight Bearing Restrictions: No    Mobility  Bed Mobility                  Transfers Overall transfer level: Needs assistance Equipment used: None Transfers: Sit to/from Stand Sit to Stand: Min guard         General transfer comment: Min Guard A for safety. for power up from [place]   Ambulation/Gait Ambulation/Gait assistance: Min guard Gait Distance (Feet): 8 Feet Assistive device: 1 person hand held assist;None Gait Pattern/deviations: Step-through pattern;Decreased stride length Gait velocity: decr Gait velocity interpretation: <1.31 ft/sec, indicative of household ambulator General Gait Details: min guard for saftey with slow mildly unsteady gait, HHA for guidance to ambulate from [facility] to sink to wash hands          Balance Overall balance assessment: Mild deficits observed, not formally tested                                          Cognition Arousal/Alertness: Lethargic Behavior During Therapy: Flat affect Overall Cognitive Status: Impaired/Different from  baseline Area of Impairment: Memory;Problem solving;Awareness                     Memory: Decreased short-term memory     Awareness: Emergent Problem Solving: Slow processing;Requires verbal cues General Comments: pt with limited conversation, is slow to respond to questions, requiring increased time for movement          General Comments General comments (skin integrity, edema, etc.): Pt with elevated BP (see OT notes) during session, RN notified and reports pt just had po medication and it has not had time to work yet. Pt also with increase in RR to 42 with standing at sink to wash hands, SaO2 dropped to 83%O2 on RA, with sitting in recliner RR returned to 20s and SaO2 in low 90%s.       Pertinent Vitals/Pain Pain Assessment: Faces Faces Pain Scale: Hurts a little bit Pain Location: generalized Pain Descriptors / Indicators: Grimacing Pain Intervention(s): Limited activity within patient's tolerance;Monitored during session;Repositioned           PT Goals (current goals can now be found in the care plan section) Acute Rehab PT Goals PT Goal Formulation: With patient Time For Goal Achievement: 02/04/19 Potential to Achieve Goals: Fair Progress towards PT goals: Not progressing toward goals - comment    Frequency    Min 2X/week      PT Plan Current  plan remains appropriate    Co-evaluation PT/OT/SLP Co-Evaluation/Treatment: Yes Reason for Co-Treatment: For patient/therapist safety PT goals addressed during session: Mobility/safety with mobility        AM-PAC PT "6 Clicks" Mobility   Outcome Measure  Help needed turning from your back to your side while in a flat bed without using bedrails?: None Help needed moving from lying on your back to sitting on the side of a flat bed without using bedrails?: None Help needed moving to and from a bed to a chair (including a wheelchair)?: A Little Help needed standing up from a chair using your arms (e.g.,  wheelchair or bedside chair)?: A Little Help needed to walk in hospital room?: A Little Help needed climbing 3-5 steps with a railing? : A Little 6 Click Score: 20    End of Session   Activity Tolerance: Patient tolerated treatment well Patient left: in chair;with call bell/phone within reach;with chair alarm set Nurse Communication: Mobility status PT Visit Diagnosis: Difficulty in walking, not elsewhere classified (R26.2);Adult, failure to thrive (R62.7)     Time: 1130-1145 PT Time Calculation (min) (ACUTE ONLY): 15 min  Charges:  $Gait Training: 8-22 mins                     Makoto Sellitto B. Migdalia Dk PT, DPT Acute Rehabilitation Services Pager [phone] Office [phone]    [place] 01/21/2019, 1:17 PM

## 2019-01-21 NOTE — Progress Notes (Signed)
Subjective:  Sodium has corrected overnight but BUN not so much   Objective Vital signs in last 24 hours: Vitals:   01/21/19 0200 01/21/19 0300 01/21/19 0400 01/21/19 0500  BP: 127/61 138/66 (!) 141/79 (!) 148/62  Pulse: 72 72 74 74  Resp: (!) 29 (!) 28 (!) 31 (!) 32  Temp:   (!) 97.4 F (36.3 C)   TempSrc:   Axillary   SpO2: 99% 100% 99% 96%  Weight:    46 kg  Height:       Weight change: -1.174 kg  Intake/Output Summary (Last 24 hours) at 01/21/2019 [id number] Last data filed at 01/21/2019 0600 Gross per 24 hour  Intake 3132.69 ml  Output 875 ml  Net 2257.69 ml    Assessment/ Plan: Pt is a 73 y.o. yo male s/p renal transplant but with CKD at baseline crt in the 3's who was admitted on 01/11/2019 with weakness after COVID treatment  Assessment/Plan: 1. Renal-  History of CKD and renal transplant.  A on chronic change due to illness and volume/free water depletion- now his free water deficit has been corrected but BUN is still relatively high.     No absolute acute needs for HD at this time but since not thriving could make argument to do it in an attempt to clear MS and improve outlook-  I think will need to speak with pts niece as I am not sure he can consent 2. S/p renal transplant-  Is on prograf and very low dose cellcept as well as decadron   3. Anemia- is on procrit normally for anemia-  Supportive therapy - will give ESA 4.  Metabolic acidosis-  Will give bicarb repletion   5. HTN/volume-  Hypertensive but with free water deficit.  continue with antihypertensivies 6.   Hypernatremia- corrected - will back off on free water repletion    Louis Meckel    Labs: Basic Metabolic Panel: Recent Labs  Lab 01/19/19 0450 01/20/19 0442 01/21/19 0235  NA 153* 150* 139  K 4.2 3.8 3.7  CL 120* 116* 111  CO2 21* 19* 16*  GLUCOSE 206* 164* 232*  BUN 142* 122* 127*  CREATININE 5.78* 5.66* 5.82*  CALCIUM 8.9 8.5* 7.6*  PHOS 5.4* 5.3* 5.6*   Liver Function  Tests: Recent Labs  Lab 01/19/19 0450 01/20/19 0442 01/21/19 0235  AST 25 22 21   ALT 31 24 19   ALKPHOS 89 81 71  BILITOT 0.7 0.8 1.0  PROT 8.1 7.4 6.2*  ALBUMIN 2.6* 2.3* 1.8*   No results for input(s): LIPASE, AMYLASE in the last 168 hours. No results for input(s): AMMONIA in the last 168 hours. CBC: Recent Labs  Lab 01/17/19 0027 01/18/19 0215 01/19/19 0450 01/20/19 0442 01/21/19 0235  WBC 14.8* 8.7 8.8 7.4 7.7  NEUTROABS 13.6* 7.7 7.6 6.4 6.7  HGB 9.5* 9.3* 10.3* 9.1* 8.0*  HCT 28.2* 27.7* 30.5* 27.6* 22.9*  MCV 84.7 85.2 84.3 85.7 83.0  PLT 148* 146* 154 137* 119*   Cardiac Enzymes: No results for input(s): CKTOTAL, CKMB, CKMBINDEX, TROPONINI in the last 168 hours. CBG: Recent Labs  Lab 01/20/19 1205 01/20/19 1557 01/20/19 2100 01/20/19 2313 01/21/19 0515  GLUCAP 98 208* 264* 262* 171*    Iron Studies:  Recent Labs    01/21/19 0235  FERRITIN 1,037*   Studies/Results: No results found. Medications: Infusions: . sodium chloride    . dextrose 125 mL/hr at 01/21/19 0555  . feeding supplement (VITAL AF 1.2 CAL) 45 mL/hr at  01/20/19 2130  . remdesivir 100 mg in NS 250 mL Stopped (01/20/19 0959)    Scheduled Medications: . amLODipine  10 mg Oral Daily  . aspirin EC  81 mg Oral Daily  . Chlorhexidine Gluconate Cloth  6 each Topical Daily  . cloNIDine  0.2 mg Oral BID  . dexamethasone (DECADRON) injection  6 mg Intravenous Q24H  . feeding supplement (ENSURE ENLIVE)  237 mL Oral TID BM  . free water  300 mL Per Tube Q3H  . heparin  5,000 Units Subcutaneous Q8H  . insulin aspart  0-9 Units Subcutaneous Q4H  . mouth rinse  15 mL Mouth Rinse BID  . metoprolol tartrate  50 mg Oral BID  . mycophenolate  500 mg Oral BID  . pantoprazole (PROTONIX) IV  40 mg Intravenous Q12H  . sodium chloride flush  3 mL Intravenous Q12H  . sulfamethoxazole-trimethoprim  1 tablet Oral Q M,W,F  . tacrolimus  4 mg Oral BID    have reviewed scheduled and prn  medications.  Physical Exam: PE not performed as patient in isolation for COVD in order to preserve PPE and to avoid exposure to more providers.  Case discussed with CCM  01/21/2019,7:22 AM  LOS: 10 days

## 2019-01-21 NOTE — Progress Notes (Addendum)
PROGRESS NOTE    Francisco Williamson  [id number] DOB: Nov 20, 1945 DOA: 01/11/2019 PCP: Default, Provider, MD   Brief Narrative:  74 y.o.BM PMHx Essential HTN and CKD stage V (s/p renal transplantation), deconditioning,  Presenting with AMS. He was admitted to [facility] from 10/28-11/3 for COVID-19-associated PNA with acute respiratory failure with hypoxia. He completed [facility] course of Remdesivir and steroids, and he was discharged home on 11/3, patient was brought back to ED, given failure to thrive, dehydration and altered mental status, he was noted to have significant dehydration with hypernatremia and renal failure, he was transferred to [facility] for further management.  Transferred to [facility] on 11/17 due to possible need for HD.Marland Kitchen  Assessment & Plan:   Principal Problem:   Acute renal failure superimposed on chronic kidney disease (HCC) Active Problems:   CKD stage 5 secondary to hypertension ([facility])   Pneumonia due to COVID-19 virus   Essential hypertension   History of renal transplant   Severe sepsis (HCC)   Sepsis secondary to UTI ([facility])   HCAP (healthcare-associated pneumonia)   Bilateral pleural effusion   Acute hypernatremia   Goals of care, counseling/discussion   Palliative care by specialist  Acute on CKD stage V/RENAL transplant patient (Cr = 3.5)  NAGMA  Hypernatremia -Strict in and out +~11 L  -Daily weight -11/12 Albumin 50 g + Lasix IV 60 mg -11/13 Albumin 50 g, followed by Lasix IV 120 mg TID (discussed case with nephrology Dr. Jonnie Williamson); will see patient today.  Recommendations are for conservative care no CRRT at this time.  -11/14 discussed case with Dr. Jonnie Williamson nephrology he would like for me to hold Lasix until kidney function improves.  We will continue to monitor kidney function closely -11/16 discussed case with Dr. Corliss Williamson nephrology and she concurs patient needs CRRT/HD.  We both would prefer not to place another line in [facility] patient who has been  hospitalized twice within 3 weeks for infection.  Therefore since patient has functional AV fistula will transfer patient to [facility] for CRRT/HD for uremia due to acute on chronic renal failure..  11/17 transfer to [facility] for possible dialysis 11/18 Appreciate renal recs - no absolute need for dialysis at this time, planning for discussion with family as thought that dialysis may help clear mental status - Continue prograf and cellcept as well as bactrim ppx - bicarb and free water per renal  Sepsis due to staph epidermidis UTI -Afebrile, negative leukocytosis.  Received 2 doses of vancomycin but given his renal function is = 7 doses.  HCAP/Covid 19 pneumonia  -On previous admission patient completed treatment of remdesivir+ steroids -Not fully convinced patient has HCAP, however patient has elevated procalcitonin level Procalcitonin 11/8 (42.95), 11/11 (26.99), complete 7-day course cefepime - CXR from 11/14 without significant change with diffuse interstitial and herterogenous airspace opacity - he's s/p repeat course of dex, remdesivir, and convalescent plasma (pt worsened on 11/14 and was restarted on treatment for COVID 19)  Bilateral pleural effusion/Hypoalbuminemia -See acute on CKD stage V  Essential HTN -Amlodipine 10 mg daily -Clonidine 0.2 mg BID -Hydralazine PRN -Labetalol PRN -11/13 increase Metoprolol 50 mg BID  GI bleed -11/10 PN notes small amount of blood with stool - Occult blood pending collection  - DVT ppx was held, but now restarted on 11/17 - Hb downtrending today, but fluctuating over past few days - repeat Hb this PM -11/13 occult blood pending -11/13 transfuse 1 unit PRBC -Transfuse for hemoglobin<7   Hypernatremia -11/14 secondary  to dehydration; iatrogenic patient on large dose of Lasix.  D/Ced Lasix. -11/16 increase D5W 125 ml/hr - improved, appreciate renal assistance -See renal failure  Hypokalemia -See renal failure   Hypomagnesmia -  1.7 today, follow  Hyperglycemia -Sensitive SSI - A1c is 4.2  Diarrhea -Patient unaware of the BMs -Would also account for his hypokalemia. -Imodium -11/16 discontinue Sorbitol, bisacodyl, MiraLAX  Failure to thrive -Patient unable to care for himself at home.  Discharged on 11/3 and readmitted on 11/8.  Patient now with worsening acute on CKD stage V.  Clear that if patient goes home instead of to [facility] is at high risk for complete renal failure of his transplanted kidney. -11/16 requested placement of CorTrak tube   Per previous provider - "11/14 palliative care; patient with multiple medical problems was discharged [facility] week ago for Covid pneumonia return within [facility] week with acute respiratory failure with hypoxia and increasing renal failure (renal transplant patient).  Appears patient may not survive this hospitalization discuss hospice; recommendation continue with [facility]"   DVT prophylaxis: heparin  Code Status: [facility] Family Communication: none at bedside - discussed with niece Disposition Plan: pending    Consultants:   Renal   Procedures:   Cortrak placement on 11/17 Echo IMPRESSIONS    1. Left ventricular ejection fraction, by visual estimation, is 60 to 65%. The left ventricle has normal function. Left ventricular septal wall thickness was mildly increased. Mildly increased left ventricular posterior wall thickness. There is mildly  increased left ventricular hypertrophy.  2. Left ventricular diastolic parameters are consistent with Grade I diastolic dysfunction (impaired relaxation).  3. Global right ventricle has normal systolic function.The right ventricular size is normal. No increase in right ventricular wall thickness.  4. Left atrial size was severely dilated.  5. Right atrial size was mildly dilated.  6. The mitral valve is normal in structure. No evidence of mitral valve regurgitation. No evidence of mitral stenosis.  7. The tricuspid valve is normal in  structure. Tricuspid valve regurgitation is mild.  8. The aortic valve is grossly normal. Aortic valve regurgitation is not visualized. No evidence of aortic valve sclerosis or stenosis.  9. The pulmonic valve was normal in structure. Pulmonic valve regurgitation is not visualized. 10. Mildly elevated pulmonary artery systolic pressure. 11. The inferior vena cava is normal in size with greater than 50% respiratory variability, suggesting right atrial pressure of 3 mmHg.  Antimicrobials:  Anti-infectives (From admission, onward)   Start     Dose/Rate Route Frequency Ordered Stop   01/17/19 1300  remdesivir 100 mg in sodium chloride 0.9 % 250 mL IVPB     100 mg 500 mL/hr over 30 Minutes Intravenous Daily 01/17/19 1144 01/21/19 1106   01/16/19 1800  vancomycin (VANCOCIN) 500 mg in sodium chloride 0.9 % 100 mL IVPB     500 mg 100 mL/hr over 60 Minutes Intravenous  Once 01/13/19 1758 01/16/19 2323   01/13/19 1900  vancomycin (VANCOCIN) IVPB 1000 mg/200 mL premix     1,000 mg 200 mL/hr over 60 Minutes Intravenous  Once 01/13/19 1758 01/13/19 2200   01/12/19 1800  ceFEPIme (MAXIPIME) 1 g in sodium chloride 0.9 % 100 mL IVPB     1 g 200 mL/hr over 30 Minutes Intravenous Every 24 hours 01/11/19 0910 01/17/19 1904   01/12/19 1600  sulfamethoxazole-trimethoprim (BACTRIM) 400-80 MG per tablet 1 tablet    Note to Pharmacy: Take one tablet by mouth on Monday, Wednesday and fridays.  1 tablet Oral Every M-W-F 01/12/19 1449     01/11/19 0830  vancomycin (VANCOCIN) IVPB 1000 mg/200 mL premix  Status:  Discontinued     1,000 mg 200 mL/hr over 60 Minutes Intravenous  Once 01/11/19 0825 01/11/19 0901   01/11/19 0830  ceFEPIme (MAXIPIME) 2 g in sodium chloride 0.9 % 100 mL IVPB     2 g 200 mL/hr over 30 Minutes Intravenous  Once 01/11/19 0825 01/11/19 0952     Subjective: Meyer Arora&Ox3 No complaints, feels comfortable  Objective: Vitals:   01/21/19 0900 01/21/19 1000 01/21/19 1031 01/21/19 1100  BP: (!)  164/121 (!) 161/59 (!) 161/59 (!) 161/63  Pulse: 84 80  84  Resp: (!) 21 (!) 32  (!) 33  Temp:      TempSrc:      SpO2: 93% 94%  92%  Weight:      Height:        Intake/Output Summary (Last 24 hours) at 01/21/2019 1203 Last data filed at 01/21/2019 1100 Gross per 24 hour  Intake 3676.8 ml  Output 875 ml  Net 2801.8 ml   Filed Weights   01/19/19 0415 01/20/19 0400 01/21/19 0500  Weight: 45.6 kg 47.2 kg 46 kg    Examination:  General exam: Appears calm and comfortable  Respiratory system: unlabored Cardiovascular system: RRR Gastrointestinal system: Abdomen is nondistended, soft and nontender.  Central nervous system: Alert and oriented. No focal neurological deficits. Extremities: no LEE Skin: No rashes, lesions or ulcers Psychiatry: Judgement and insight appear normal. Mood & affect appropriate.     Data Reviewed: I have personally reviewed following labs and imaging studies  CBC: Recent Labs  Lab 01/17/19 0027 01/18/19 0215 01/19/19 0450 01/20/19 0442 01/21/19 0235  WBC 14.8* 8.7 8.8 7.4 7.7  NEUTROABS 13.6* 7.7 7.6 6.4 6.7  HGB 9.5* 9.3* 10.3* 9.1* 8.0*  HCT 28.2* 27.7* 30.5* 27.6* 22.9*  MCV 84.7 85.2 84.3 85.7 83.0  PLT 148* 146* 154 137* 123456*   Basic Metabolic Panel: Recent Labs  Lab 01/17/19 0027 01/18/19 0215 01/19/19 0450 01/20/19 0442 01/21/19 0235  NA 150* 150* 153* 150* 139  K 2.9* 3.2* 4.2 3.8 3.7  CL 114* 112* 120* 116* 111  CO2 21* 22 21* 19* 16*  GLUCOSE 90 236* 206* 164* 232*  BUN 100* 128* 142* 122* 127*  CREATININE 5.12* 5.79* 5.78* 5.66* 5.82*  CALCIUM 8.3* 9.0 8.9 8.5* 7.6*  MG 1.8 2.2 2.2 1.9 1.7  PHOS 4.0 4.5 5.4* 5.3* 5.6*   GFR: Estimated Creatinine Clearance: 7.6 mL/min (Tanylah Schnoebelen) (by C-G formula based on SCr of 5.82 mg/dL (H)). Liver Function Tests: Recent Labs  Lab 01/17/19 0027 01/18/19 0215 01/19/19 0450 01/20/19 0442 01/21/19 0235  AST 43* 45* 25 22 21   ALT 31 41 31 24 19   ALKPHOS 67 94 89 81 71  BILITOT 1.1  0.6 0.7 0.8 1.0  PROT 7.2 7.9 8.1 7.4 6.2*  ALBUMIN 2.5* 2.4* 2.6* 2.3* 1.8*   No results for input(s): LIPASE, AMYLASE in the last 168 hours. No results for input(s): AMMONIA in the last 168 hours. Coagulation Profile: No results for input(s): INR, PROTIME in the last 168 hours. Cardiac Enzymes: No results for input(s): CKTOTAL, CKMB, CKMBINDEX, TROPONINI in the last 168 hours. BNP (last 3 results) No results for input(s): PROBNP in the last 8760 hours. HbA1C: No results for input(s): HGBA1C in the last 72 hours. CBG: Recent Labs  Lab 01/20/19 1557 01/20/19 2100 01/20/19 2313 01/21/19 0515 01/21/19 0846  GLUCAP  208* 264* 262* 171* 194*   Lipid Profile: No results for input(s): CHOL, HDL, LDLCALC, TRIG, CHOLHDL, LDLDIRECT in the last 72 hours. Thyroid Function Tests: No results for input(s): TSH, T4TOTAL, FREET4, T3FREE, THYROIDAB in the last 72 hours. Anemia Panel: Recent Labs    01/20/19 0442 01/21/19 0235  FERRITIN 2,934* 1,037*   Sepsis Labs: Recent Labs  Lab 01/15/19 0000 01/16/19 0745 01/16/19 1045 01/17/19 0027 01/18/19 0215  PROCALCITON 19.70 9.60  --  11.99 21.96  LATICACIDVEN  --  1.5 1.1  --   --     Recent Results (from the past 240 hour(s))  Culture, Urine     Status: Abnormal   Collection Time: 01/11/19  2:55 PM   Specimen: Urine, Clean Catch  Result Value Ref Range Status   Specimen Description URINE, CLEAN CATCH  Final   Special Requests   Final    NONE Performed at [facility], [place] [address] [place], [place] [address]    Culture 40,000 COLONIES/mL STAPHYLOCOCCUS EPIDERMIDIS (Artemis Loyal)  Final   Report Status 01/13/2019 FINAL  Final   Organism ID, Bacteria STAPHYLOCOCCUS EPIDERMIDIS (Cinnamon Morency)  Final      Susceptibility   Staphylococcus epidermidis - MIC*    CIPROFLOXACIN >=8 RESISTANT Resistant     GENTAMICIN <=0.5 SENSITIVE Sensitive     NITROFURANTOIN <=16 SENSITIVE Sensitive     OXACILLIN >=4 RESISTANT Resistant     TETRACYCLINE 2  SENSITIVE Sensitive     VANCOMYCIN 1 SENSITIVE Sensitive     TRIMETH/SULFA 160 RESISTANT Resistant     CLINDAMYCIN >=8 RESISTANT Resistant     RIFAMPIN <=0.5 SENSITIVE Sensitive     Inducible Clindamycin NEGATIVE Sensitive     * 40,000 COLONIES/mL STAPHYLOCOCCUS EPIDERMIDIS  Culture, blood (routine x 2)     Status: None   Collection Time: 01/16/19  7:45 AM   Specimen: BLOOD  Result Value Ref Range Status   Specimen Description   Final    BLOOD RIGHT ANTECUBITAL Performed at [facility] [address] [place], [place] [address]    Special Requests   Final    BOTTLES DRAWN AEROBIC ONLY Blood Culture adequate volume Performed at [facility] [address], [place], [place] [address]    Culture   Final    NO GROWTH 5 DAYS Performed at [facility], [place] [address], [place], [place] [address]    Report Status 01/21/2019 FINAL  Final  Culture, blood (routine x 2)     Status: None   Collection Time: 01/16/19  7:50 AM   Specimen: BLOOD  Result Value Ref Range Status   Specimen Description   Final    BLOOD RIGHT HAND Performed at [facility] [address], [place], [place] [address]    Special Requests   Final    BOTTLES DRAWN AEROBIC ONLY Blood Culture adequate volume Performed at [facility] [address], [place], [place] [address]    Culture   Final    NO GROWTH 5 DAYS Performed at [facility], [place] [address], [place], [place] [address]    Report Status 01/21/2019 FINAL  Final         Radiology Studies: No results found.      Scheduled Meds: . amLODipine  10 mg Oral Daily  . aspirin EC  81 mg Oral Daily  . Chlorhexidine Gluconate Cloth  6 each Topical Daily  . cloNIDine  0.2 mg Oral BID  .  darbepoetin (ARANESP) injection - NON-DIALYSIS  150 mcg Subcutaneous Q Wed-1800  . dexamethasone (DECADRON) injection  6 mg Intravenous Q24H  . feeding supplement (ENSURE  ENLIVE)  237 mL Oral TID BM  . free water  200 mL Per Tube Q8H  . heparin  5,000 Units Subcutaneous Q8H  . insulin aspart  0-9 Units Subcutaneous Q4H  . mouth rinse  15 mL Mouth Rinse BID  . metoprolol tartrate  50 mg Oral BID  . mycophenolate  500 mg Oral BID  . pantoprazole (PROTONIX) IV  40 mg Intravenous Q12H  . sodium chloride flush  3 mL Intravenous Q12H  . sulfamethoxazole-trimethoprim  1 tablet Oral Q M,W,F  . tacrolimus  4 mg Oral BID   Continuous Infusions: . sodium chloride 10 mL/hr at 01/21/19 1035  . feeding supplement (VITAL AF 1.2 CAL) 55 mL/hr at 01/21/19 0430  .  sodium bicarbonate (isotonic) infusion in sterile water 75 mL/hr at 01/21/19 1100     LOS: 10 days    Time spent: over 30 min    Fayrene Helper, MD [facility] Pager AMION  If 7PM-7AM, please contact night-coverage [email or web address] Password [facility] 01/21/2019, 12:03 PM

## 2019-01-21 NOTE — Progress Notes (Signed)
Occupational Therapy Treatment Patient Details Name: [facility] MRN: [id number] DOB: Mar 27, 1945 Today's Date: 01/21/2019    History of present illness 73 yo male presenting with AMS and failure to thrive, renal failure.Pt was admitted to [facility] from 10/28-11/3 for COVID + and PNA. 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   OT comments  Pt presenting with decreased strength, balance, and activity tolerance compared to last session. Pt agreeable to therapy and OOB activity. Pt requiring Min A for toilet transfer and toilet hygiene. Pt attempting to perform hand hygiene at sink, however, became significantly fatigue with SpO2 dropping to 83% and RR elevated to 43. Pt taking seated rest break and completing hand hygiene while seated. At rest, SpO2 elevating to 88% on RA and RR at 34; RN notified. Update dc recommendation to SNF and will continue to follow acutely as admitted.    Follow Up Recommendations  SNF;Supervision/Assistance - 24 hour    Equipment Recommendations  3 in 1 bedside commode;Tub/shower seat    Recommendations for Other Services PT consult    Precautions / Restrictions Precautions Precautions: Fall Restrictions Weight Bearing Restrictions: No       Mobility Bed Mobility Overal bed mobility: Needs Assistance Bed Mobility: Supine to Sit     Supine to sit: HOB elevated;Min guard     General bed mobility comments: Min Guard A for safety  Transfers Overall transfer level: Needs assistance Equipment used: None Transfers: Sit to/from [facility] Sit to Stand: Min guard Stand pivot transfers: Min assist       General transfer comment: Min Guard A for safety. for power up from EOB and BSC. Min A for balance and safety during pivot to recliner    Balance Overall balance assessment: Mild deficits observed, not formally tested                                         ADL either performed  or assessed with clinical judgement   ADL Overall ADL's : Needs assistance/impaired     Grooming: Sitting;Min guard;Wash/dry hands Grooming Details (indicate cue type and reason): Pt attempting to perform hand hygiene at sink while standing with Min A. However, SpO2 dropping to 85% on RA and RR elevating to 42. Provided pt with seat and pt taking seated rest break.              Lower Body Dressing: Minimal assistance;Sit to/from stand Lower Body Dressing Details (indicate cue type and reason): Pt donning his socks while seated at EOB. Min A for balance in standing Toilet Transfer: Minimal assistance;Stand-pivot;BSC Toilet Transfer Details (indicate cue type and reason): Min A for balance and safety during pivot to [facility] Toileting- Clothing Manipulation and Hygiene: Minimal assistance;Sit to/from stand Toileting - Clothing Manipulation Details (indicate cue type and reason): Min A for standing balance while pt performed peri care after large BM     Functional mobility during ADLs: Minimal assistance;+2 for safety/equipment(short distance) General ADL Comments: Pt presenting with poor activity tolerance impacting his safety and performance of ADLs.      Vision       Perception     Praxis      Cognition Arousal/Alertness: Lethargic Behavior During Therapy: Flat affect Overall Cognitive Status: Impaired/Different from baseline Area of Impairment: Memory;Problem solving;Awareness  Memory: Decreased short-term memory     Awareness: Emergent Problem Solving: Slow processing;Requires verbal cues General Comments: pt with limited conversation, is slow to respond to questions, requiring increased time for movement. Similarly to Eval, pt will answer yes/no to a question initially and then change his answer. Pt oriented that he is at [facility] for [facility].        Exercises     Shoulder Instructions       General Comments BP 170/68 while supine in  bed; RN notified. BP at end of session 142/73. SpO2 dropping to 83% on RA and RR elevated to 42 during ADLs. HR 80-90s    Pertinent Vitals/ Pain       Pain Assessment: Faces Faces Pain Scale: Hurts a little bit Pain Location: generalized Pain Descriptors / Indicators: Grimacing Pain Intervention(s): Monitored during session;Limited activity within patient's tolerance;Repositioned  Home Living                                          Prior Functioning/Environment              Frequency  Min 3X/week        Progress Toward Goals  OT Goals(current goals can now be found in the care plan section)  Progress towards OT goals: Not progressing toward goals - comment(Pt with decreased activity tolerance this session)  Acute Rehab OT Goals Patient Stated Goal: "Go home soon" OT Goal Formulation: With patient Time For Goal Achievement: 01/27/19 Potential to Achieve Goals: Good ADL Goals Pt Will Perform Grooming: with modified independence;standing Pt Will Perform Lower Body Dressing: with modified independence;sit to/from stand Pt Will Transfer to Toilet: with modified independence;ambulating;regular height toilet Pt Will Perform Toileting - Clothing Manipulation and hygiene: with modified independence;sit to/from stand;sitting/lateral leans Additional ADL Goal #1: Pt will demonstrate increased ST memory to recall and perform four ADL tasks with 1-2 cues Additional ADL Goal #2: Pt will verablize three energy conservation techniques for ADLs with Min cues  Plan Discharge plan needs to be updated    Co-evaluation    PT/OT/SLP Co-Evaluation/Treatment: Yes Reason for Co-Treatment: For patient/therapist safety;To address functional/ADL transfers(activity tolerance) PT goals addressed during session: Mobility/safety with mobility OT goals addressed during session: ADL's and self-care      AM-PAC OT "6 Clicks" Daily Activity     Outcome Measure   Help from  another person eating meals?: None Help from another person taking care of personal grooming?: A Little Help from another person toileting, which includes using toliet, bedpan, or urinal?: A Little Help from another person bathing (including washing, rinsing, drying)?: A Little Help from another person to put on and taking off regular upper body clothing?: None Help from another person to put on and taking off regular lower body clothing?: A Little 6 Click Score: 20    End of Session    OT Visit Diagnosis: Unsteadiness on feet (R26.81);Other abnormalities of gait and mobility (R26.89);Muscle weakness (generalized) (M62.81);Other symptoms and signs involving cognitive function   Activity Tolerance Patient limited by fatigue   Patient Left in chair;with call bell/phone within reach;with chair alarm set   Nurse Communication Mobility status;Other (comment)(BP)        Time: [id number] OT Time Calculation (min): 43 min  Charges: OT General Charges $OT Visit: 1 Visit OT Treatments $Self Care/Home Management : 23-37 mins  Bartley Vuolo MSOT, OTR/L Acute Rehab  Pager: [phone] Office: [place] 01/21/2019, 1:54 PM

## 2019-01-21 NOTE — Progress Notes (Signed)
Was able to speak with pts Niece [place] as well as pts daughter, [facility].  They understand that patient may benefit from dialysis and agree for me to go ahead and perform it.   I will put in orders for him to get an HD treatment tomorrow and also likely on 11/20  [place]

## 2019-01-21 NOTE — Progress Notes (Signed)
Palliative: Remote consult due to Covid diagnosis.  Conference with bedside nursing related to patient condition, needs.  [facility] is visualized through the glass door to his room.  He is sitting up in the Eckley chair in his room.  No family present at this time.  [facility] continues to look weak and frail, but is able to make his basic needs known.  At this point, hemodialysis is on hold.  Call to niece, Dellie Catholic ([place]) Amedeo Plenty at [phone].  [place] answers and states that she will reach out to Daughter, [place] from [place], [place] who was then merged into call.  We talk about frailty, lost weight, during this illness.  Family states that [facility] has never weighed more than 100 lbs. family states that Dr. [name], who Mr. Chapla knows and trust is recommending dialysis for a few days.  Family shares that they understand dialysis may make a difference for him.  We talked about the "what if's and maybe's".  Dellie Catholic and Naaman Plummer agree that if he regains his strength he will go to rehab, if he does not regain his strength they will seek hospice at home.   Daughter, [facility],  [phone].        Plan:  Continue to treat the treatable, agree to HD trial for a few days.  Family is in agreement that if Mr. Shuey regains his strength then he should go to rehab, if he does not regain strength they would seek out in home hospice care..     40 minutes Quinn Axe, NP Palliative Medicine Team Team Phone # [phone] Greater than 50% of this time was spent counseling and coordinating care related to the above assessment and plan.

## 2019-01-21 NOTE — Progress Notes (Signed)
Inpatient Diabetes Program Recommendations  AACE/ADA: New Consensus Statement on Inpatient Glycemic Control (2015)  Target Ranges:  Prepandial:   less than 140 mg/dL      Peak postprandial:   less than 180 mg/dL (1-2 hours)      Critically ill patients:  140 - 180 mg/dL   Results for Francisco Williamson, Francisco Williamson (MRN [id number]) as of 01/21/2019 07:49  Ref. Range 01/20/2019 01:08 01/20/2019 07:15 01/20/2019 11:09 01/20/2019 12:05 01/20/2019 15:57 01/20/2019 21:00  Glucose-Capillary Latest Ref Range: 70 - 99 mg/dL 272 (H)  5 units NOVOLOG  143 (H)  1 unit NOVOLOG  68 (L) 98 208 (H)  3 units NOVOLOG  264 (H)  5 units NOVOLOG    Results for Francisco Williamson, Francisco Williamson (MRN [id number]) as of 01/21/2019 14:15  Ref. Range 01/20/2019 23:13 01/21/2019 05:15 01/21/2019 08:46 01/21/2019 12:26  Glucose-Capillary Latest Ref Range: 70 - 99 mg/dL 262 (H)  5 units NOVOLOG 171 (H)  2 units NOVOLOG 194 (H)  2 units NOVOLOG  163 (H)  2 units NOVOLOG    Home DM Meds: None--No Previous History of Diabetes  Current Orders: Novolog Sensitive Correction Scale/ SSI (0-9 units) Q4 hours     Getting Decadron 6 mg Daily.  Note patient started on Vital AF tube feeds yesterday at 5:30pm.  Goal rate 55cc/hr.  CBGs stable today.    MD- If you note that CBGs are rising due to initiation of tube feeds, may consider starting low dose Novolog tube feed coverage:  Novolog 2 units Q4 hours  HOLD if tube feeds HELD for any reason    --Will follow patient during hospitalization--  Wyn Quaker RN, MSN, CDE Diabetes Coordinator Inpatient Glycemic Control Team Team Pager: [phone] (8a-5p)

## 2019-01-22 ENCOUNTER — Inpatient Hospital Stay (HOSPITAL_COMMUNITY): Payer: Medicare Other

## 2019-01-22 LAB — D-DIMER, QUANTITATIVE: D-Dimer, Quant: 2.46 ug/mL-FEU — ABNORMAL HIGH (ref 0.00–0.50)

## 2019-01-22 LAB — COMPREHENSIVE METABOLIC PANEL
ALT: 49 U/L — ABNORMAL HIGH (ref 0–44)
AST: 49 U/L — ABNORMAL HIGH (ref 15–41)
Albumin: 1.7 g/dL — ABNORMAL LOW (ref 3.5–5.0)
Alkaline Phosphatase: 87 U/L (ref 38–126)
Anion gap: 15 (ref 5–15)
BUN: 136 mg/dL — ABNORMAL HIGH (ref 8–23)
CO2: 18 mmol/L — ABNORMAL LOW (ref 22–32)
Calcium: 7 mg/dL — ABNORMAL LOW (ref 8.9–10.3)
Chloride: 107 mmol/L (ref 98–111)
Creatinine, Ser: 5.64 mg/dL — ABNORMAL HIGH (ref 0.61–1.24)
GFR calc Af Amer: 11 mL/min — ABNORMAL LOW (ref >60)
GFR calc non Af Amer: 9 mL/min — ABNORMAL LOW (ref >60)
Glucose, Bld: 163 mg/dL — ABNORMAL HIGH (ref 70–99)
Potassium: 3.1 mmol/L — ABNORMAL LOW (ref 3.5–5.1)
Sodium: 140 mmol/L (ref 135–145)
Total Bilirubin: 0.6 mg/dL (ref 0.3–1.2)
Total Protein: 5.9 g/dL — ABNORMAL LOW (ref 6.5–8.1)

## 2019-01-22 LAB — CBC WITH DIFFERENTIAL/PLATELET
Abs Immature Granulocytes: 0.12 10*3/uL — ABNORMAL HIGH (ref 0.00–0.07)
Basophils Absolute: 0 10*3/uL (ref 0.0–0.1)
Basophils Relative: 0 %
Eosinophils Absolute: 0 10*3/uL (ref 0.0–0.5)
Eosinophils Relative: 0 %
HCT: 22.6 % — ABNORMAL LOW (ref 39.0–52.0)
Hemoglobin: 7.5 g/dL — ABNORMAL LOW (ref 13.0–17.0)
Immature Granulocytes: 2 %
Lymphocytes Relative: 12 %
Lymphs Abs: 1 10*3/uL (ref 0.7–4.0)
MCH: 28.5 pg (ref 26.0–34.0)
MCHC: 33.2 g/dL (ref 30.0–36.0)
MCV: 85.9 fL (ref 80.0–100.0)
Monocytes Absolute: 0.5 10*3/uL (ref 0.1–1.0)
Monocytes Relative: 6 %
Neutro Abs: 6.6 10*3/uL (ref 1.7–7.7)
Neutrophils Relative %: 80 %
Platelets: 117 10*3/uL — ABNORMAL LOW (ref 150–400)
RBC: 2.63 MIL/uL — ABNORMAL LOW (ref 4.22–5.81)
RDW: 17.5 % — ABNORMAL HIGH (ref 11.5–15.5)
WBC: 8.2 10*3/uL (ref 4.0–10.5)
nRBC: 0 % (ref 0.0–0.2)

## 2019-01-22 LAB — OCCULT BLOOD X 1 CARD TO LAB, STOOL: Fecal Occult Bld: POSITIVE — AB

## 2019-01-22 LAB — PHOSPHORUS: Phosphorus: 5.1 mg/dL — ABNORMAL HIGH (ref 2.5–4.6)

## 2019-01-22 LAB — GLUCOSE, CAPILLARY
Glucose-Capillary: 102 mg/dL — ABNORMAL HIGH (ref 70–99)
Glucose-Capillary: 105 mg/dL — ABNORMAL HIGH (ref 70–99)
Glucose-Capillary: 138 mg/dL — ABNORMAL HIGH (ref 70–99)
Glucose-Capillary: 147 mg/dL — ABNORMAL HIGH (ref 70–99)
Glucose-Capillary: 152 mg/dL — ABNORMAL HIGH (ref 70–99)
Glucose-Capillary: 153 mg/dL — ABNORMAL HIGH (ref 70–99)
Glucose-Capillary: 243 mg/dL — ABNORMAL HIGH (ref 70–99)

## 2019-01-22 LAB — HEMOGLOBIN AND HEMATOCRIT, BLOOD
HCT: 21.5 % — ABNORMAL LOW (ref 39.0–52.0)
Hemoglobin: 7.7 g/dL — ABNORMAL LOW (ref 13.0–17.0)

## 2019-01-22 LAB — IRON AND TIBC: Iron: 69 ug/dL (ref 45–182)

## 2019-01-22 LAB — FOLATE: Folate: 4 ng/mL — ABNORMAL LOW (ref >5.9)

## 2019-01-22 LAB — C-REACTIVE PROTEIN: CRP: 8.9 mg/dL — ABNORMAL HIGH (ref <1.0)

## 2019-01-22 LAB — HEPATITIS B SURFACE ANTIGEN: Hepatitis B Surface Ag: NONREACTIVE

## 2019-01-22 LAB — HEPATITIS B SURFACE ANTIBODY,QUALITATIVE: Hep B S Ab: NONREACTIVE

## 2019-01-22 LAB — FERRITIN: Ferritin: 1967 ng/mL — ABNORMAL HIGH (ref 24–336)

## 2019-01-22 LAB — MAGNESIUM: Magnesium: 1.6 mg/dL — ABNORMAL LOW (ref 1.7–2.4)

## 2019-01-22 LAB — VITAMIN B12: Vitamin B-12: 381 pg/mL (ref 180–914)

## 2019-01-22 LAB — HEPATITIS B CORE ANTIBODY, IGM: Hep B C IgM: NONREACTIVE

## 2019-01-22 MED ORDER — PENTAFLUOROPROP-TETRAFLUOROETH EX AERO: 1.0000 "application " | INHALATION_SPRAY | CUTANEOUS | Status: DC | PRN

## 2019-01-22 MED ORDER — FOLIC ACID 1 MG PO TABS
1.0000 mg | ORAL_TABLET | Freq: Every day | ORAL | Status: DC
  Administered 2019-01-22 – 2019-02-06 (×15): 1 mg via ORAL
  Filled 2019-01-22 (×15): qty 1

## 2019-01-22 MED ORDER — SODIUM CHLORIDE 0.9 % IV SOLN: 100.0000 mL | INTRAVENOUS | Status: DC | PRN

## 2019-01-22 MED ORDER — CHLORHEXIDINE GLUCONATE CLOTH 2 % EX PADS
6.0000 | MEDICATED_PAD | Freq: Every day | CUTANEOUS | Status: DC
  Administered 2019-01-24 – 2019-02-04 (×9): 6 via TOPICAL

## 2019-01-22 MED ORDER — HEPARIN SODIUM (PORCINE) 1000 UNIT/ML DIALYSIS
1000.0000 [IU] | INTRAMUSCULAR | Status: DC | PRN
  Administered 2019-01-28: 1000 [IU] via INTRAVENOUS_CENTRAL

## 2019-01-22 MED ORDER — LIDOCAINE HCL (PF) 1 % IJ SOLN: 5.0000 mL | INTRAMUSCULAR | Status: DC | PRN

## 2019-01-22 MED ORDER — LIDOCAINE-PRILOCAINE 2.5-2.5 % EX CREA
1.0000 "application " | TOPICAL_CREAM | CUTANEOUS | Status: DC | PRN
  Filled 2019-01-22: qty 5

## 2019-01-22 MED ORDER — MAGNESIUM OXIDE 400 (241.3 MG) MG PO TABS
400.0000 mg | ORAL_TABLET | Freq: Two times a day (BID) | ORAL | Status: DC
  Administered 2019-01-22 – 2019-02-06 (×27): 400 mg via ORAL
  Filled 2019-01-22 (×30): qty 1

## 2019-01-22 NOTE — Progress Notes (Signed)
Subjective:  Patient seems to be more alert by notes -  Plan is to start some dialysis today to see if will help with his weakness/FTT  Objective Vital signs in last 24 hours: Vitals:   01/22/19 0323 01/22/19 0400 01/22/19 0500 01/22/19 0600  BP:  (!) 154/71 (!) 149/63 (!) 165/82  Pulse:  82 76 75  Resp:  (!) 27 (!) 25 (!) 24  Temp:      TempSrc:      SpO2:  98% 100% 97%  Weight: 49.6 kg     Height:       Weight change: 3.6 kg  Intake/Output Summary (Last 24 hours) at 01/22/2019 [id number] Last data filed at 01/22/2019 0600 Gross per 24 hour  Intake 3796.86 ml  Output 875 ml  Net 2921.86 ml    Assessment/ Plan: Pt is a 73 y.o. yo male s/p renal transplant but with CKD at baseline crt in the 3's who was admitted on 01/11/2019 with weakness after COVID treatment  Assessment/Plan: 1. Renal-  History of CKD and renal transplant.  A on chronic change due to illness and volume/free water depletion- now his free water deficit has been corrected but BUN is still relatively high.    since not thriving I have spoken with family- we would like to attempt a trial of HD to see if can clear MS more and improve outlook- for first short treatment today-  Second tomorrow 2. S/p renal transplant-  Is on prograf and very low dose cellcept as well as decadron   3. Anemia- is on procrit normally for anemia-  Supportive therapy - giving ESA 4.  Metabolic acidosis-  Will give bicarb repletion   5. HTN/volume-  Hypertensive.  continue with antihypertensivies 6.   Hypernatremia- corrected - backed off on free water repletion    Louis Meckel    Labs: Basic Metabolic Panel: Recent Labs  Lab 01/20/19 0442 01/21/19 0235 01/22/19 0215  NA 150* 139 140  K 3.8 3.7 3.1*  CL 116* 111 107  CO2 19* 16* 18*  GLUCOSE 164* 232* 163*  BUN 122* 127* 136*  CREATININE 5.66* 5.82* 5.64*  CALCIUM 8.5* 7.6* 7.0*  PHOS 5.3* 5.6* 5.1*   Liver Function Tests: Recent Labs  Lab 01/20/19 0442  01/21/19 0235 01/22/19 0215  AST 22 21 49*  ALT 24 19 49*  ALKPHOS 81 71 87  BILITOT 0.8 1.0 0.6  PROT 7.4 6.2* 5.9*  ALBUMIN 2.3* 1.8* 1.7*   No results for input(s): LIPASE, AMYLASE in the last 168 hours. No results for input(s): AMMONIA in the last 168 hours. CBC: Recent Labs  Lab 01/18/19 0215 01/19/19 0450 01/20/19 0442 01/21/19 0235 01/21/19 1458 01/22/19 0215  WBC 8.7 8.8 7.4 7.7  --  8.2  NEUTROABS 7.7 7.6 6.4 6.7  --  6.6  HGB 9.3* 10.3* 9.1* 8.0* 7.8* 7.5*  HCT 27.7* 30.5* 27.6* 22.9* 23.0* 22.6*  MCV 85.2 84.3 85.7 83.0  --  85.9  PLT 146* 154 137* 119*  --  117*   Cardiac Enzymes: No results for input(s): CKTOTAL, CKMB, CKMBINDEX, TROPONINI in the last 168 hours. CBG: Recent Labs  Lab 01/21/19 1226 01/21/19 1506 01/21/19 2100 01/22/19 0051 01/22/19 0319  GLUCAP 163* 216* 305* 147* 152*    Iron Studies:  Recent Labs    01/22/19 0215  FERRITIN 1,967*   Studies/Results: No results found. Medications: Infusions: . sodium chloride 10 mL/hr at 01/22/19 0600  . feeding supplement (VITAL AF 1.2 CAL)  55 mL/hr at 01/22/19 0600  .  sodium bicarbonate (isotonic) infusion in sterile water 75 mL/hr at 01/22/19 0600    Scheduled Medications: . amLODipine  10 mg Oral Daily  . aspirin EC  81 mg Oral Daily  . Chlorhexidine Gluconate Cloth  6 each Topical Daily  . Chlorhexidine Gluconate Cloth  6 each Topical Q0600  . cloNIDine  0.2 mg Oral BID  . darbepoetin (ARANESP) injection - NON-DIALYSIS  150 mcg Subcutaneous Q Wed-1800  . feeding supplement (ENSURE ENLIVE)  237 mL Oral TID BM  . free water  200 mL Per Tube Q8H  . insulin aspart  0-9 Units Subcutaneous Q4H  . mouth rinse  15 mL Mouth Rinse BID  . metoprolol tartrate  50 mg Oral BID  . mycophenolate  500 mg Oral BID  . pantoprazole (PROTONIX) IV  40 mg Intravenous Q12H  . predniSONE  10 mg Oral Q breakfast  . sodium chloride flush  3 mL Intravenous Q12H  . sulfamethoxazole-trimethoprim  1 tablet  Oral Q M,W,F  . tacrolimus  4 mg Oral BID    have reviewed scheduled and prn medications.  Physical Exam: PE not performed as patient in isolation for COVD in order to preserve PPE and to avoid exposure to more providers.  Case discussed with CCM  01/22/2019,7:18 AM  LOS: 11 days

## 2019-01-22 NOTE — Progress Notes (Addendum)
PROGRESS NOTE    Francisco Williamson  [id number] DOB: [birth year] DOA: 01/11/2019 PCP: Default, Provider, MD   Brief Narrative:  73 y.o.BM PMHx Essential HTN and CKD stage V (s/p renal transplantation), deconditioning,  Presenting with AMS. He was admitted to [facility] from 10/28-11/3 for COVID-19-associated PNA with acute respiratory failure with hypoxia. He completed a course of Remdesivir and steroids, and he was discharged home on 11/3, patient was brought back to ED, given failure to thrive, dehydration and altered mental status, he was noted to have significant dehydration with hypernatremia and renal failure, he was transferred to [facility] for further management.  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 ([place])   Pneumonia due to COVID-19 virus   Essential hypertension   History of renal transplant   Severe sepsis ([facility])   Sepsis secondary to UTI ([place])   HCAP (healthcare-associated pneumonia)   Bilateral pleural effusion   Acute hypernatremia   Goals of care, counseling/discussion   Palliative care by specialist  Acute on CKD stage V/RENAL transplant patient (Cr = 3.5)  NAGMA  Hypernatremia - creatinine worsened since admission (baseline appears in 3-4 range - most recently ~3.58 on 11/3) - he received lasix/albumin earlier in course of hospitalization - creatinine fluctuating over past few days, 5.6 today - Appreciate renal assistance - planning for dialysis today - AKI on CKD thought related to illness and volume/free water depletion.  -I/O, daily weights - Continue prograf and cellcept as well as bactrim ppx - bicarb and free water per renal  Sepsis due to staph epidermidis UTI -Afebrile, negative leukocytosis.  With his renal function, the doses of vanc he received should have covered him   HCAP/Covid 19 pneumonia  Acute Hypoxic Respiratory Failure - On 6 L [place] today, unclear why, was on RA yesterday - will wean as tolerated with nursing, CXR as noted below - follow and w/u further as indicated -On previous admission patient completed treatment of remdesivir+ steroids -Not fully convinced patient has HCAP, however patient has elevated procalcitonin level Procalcitonin 11/8 (42.95), 11/11 (26.99), complete 7-day course cefepime - CXR from 11/14 without significant change with diffuse interstitial and herterogenous airspace opacity - CXR from 11/19 with slight interval improvement in bilateral confluent airspace opacities compared to prior CXR - he's s/p repeat course of dex, remdesivir, and convalescent plasma (pt worsened on 11/14 and was restarted on treatment for COVID 19)  COVID-19 Labs  Recent Labs    01/20/19 0442 01/21/19 0235 01/22/19 0215  DDIMER 2.74* 2.25* 2.46*  FERRITIN 2,934* 1,037* 1,967*  CRP 13.6* 11.4* 8.9*    Lab Results  Component Value Date   SARSCOV2NAA POSITIVE (A) 12/31/2018    Bilateral pleural effusion/Hypoalbuminemia -See acute on CKD stage V  Essential HTN -Amlodipine 10 mg daily -Clonidine 0.2 mg BID -Hydralazine PRN -Labetalol PRN -11/13 increase Metoprolol 50 mg BID  GI bleed -11/10 PN notes small amount of blood with stool - Occult blood pending collection - this was positive - DVT ppx was held, but now restarted on 11/17 - hold DVT ppx  - Repeat Hb this PM - hemoccult positive - will consult GI  -11/13 transfuse 1 unit PRBC -Transfuse for hemoglobin<7   Hypernatremia - improved, appreciate renal assistance -See renal failure  Hypokalemia -See renal failure - K per renal  Hypomagnesmia -  PO mag, follow  Hyperglycemia -Sensitive SSI - A1c is 4.2  Diarrhea -Patient unaware of the BMs -Would also account for his hypokalemia. -Imodium -11/16 discontinue Sorbitol, bisacodyl, MiraLAX  Failure to thrive  -Patient unable to care for himself at home.  Discharged on 11/3 and readmitted on 11/8.  Patient now with worsening acute on CKD stage V.  Clear that if patient goes home instead of to a SNF is at high risk for complete renal failure of his transplanted kidney. -11/16 requested placement of CorTrak tube - SLP eval today note ok for regular/thin liquids - follow intake   Goals of Care: per palliative care note on 11/18, Francisco Williamson was agreeable to HD for a few days.  If he improves, plan for rehab.  Consider home hospice if he fails to improve.  Today Francisco Williamson was focused on getting home and wanting to leave, but after discussion with renal about dialysis he decided to stay.  Will continue to discuss goc with pt and family.   DVT prophylaxis: heparin  Code Status: DNR Family Communication: none at bedside - discussed with niece 11/19 Disposition Plan: pending    Consultants:   Renal   Procedures:   Cortrak placement on 11/17 Echo IMPRESSIONS    1. Left ventricular ejection fraction, by visual estimation, is 60 to 65%. The left ventricle has normal function. Left ventricular septal wall thickness was mildly increased. Mildly increased left ventricular posterior wall thickness. There is mildly  increased left ventricular hypertrophy.  2. Left ventricular diastolic parameters are consistent with Grade I diastolic dysfunction (impaired relaxation).  3. Global right ventricle has normal systolic function.The right ventricular size is normal. No increase in right ventricular wall thickness.  4. Left atrial size was severely dilated.  5. Right atrial size was mildly dilated.  6. The mitral valve is normal in structure. No evidence of mitral valve regurgitation. No evidence of mitral stenosis.  7. The tricuspid valve is normal in structure. Tricuspid valve regurgitation is mild.  8. The aortic valve is grossly normal. Aortic valve regurgitation is not visualized. No evidence of  aortic valve sclerosis or stenosis.  9. The pulmonic valve was normal in structure. Pulmonic valve regurgitation is not visualized. 10. Mildly elevated pulmonary artery systolic pressure. 11. The inferior vena cava is normal in size with greater than 50% respiratory variability, suggesting right atrial pressure of 3 mmHg.  Antimicrobials:  Anti-infectives (From admission, onward)   Start     Dose/Rate Route Frequency Ordered Stop   01/17/19 1300  remdesivir 100 mg in sodium chloride 0.9 % 250 mL IVPB     100 mg 500 mL/hr over 30 Minutes Intravenous Daily 01/17/19 1144 01/21/19 1106   01/16/19 1800  vancomycin (VANCOCIN) 500 mg in sodium chloride 0.9 % 100 mL IVPB     500 mg 100 mL/hr over 60 Minutes Intravenous  Once 01/13/19 1758 01/16/19 2323   01/13/19 1900  vancomycin (VANCOCIN) IVPB 1000 mg/200 mL premix     1,000 mg 200 mL/hr over 60 Minutes Intravenous  Once 01/13/19 1758 01/13/19 2200   01/12/19 1800  ceFEPIme (MAXIPIME) 1 g in sodium chloride 0.9 % 100 mL IVPB     1 g 200 mL/hr over 30 Minutes Intravenous Every 24 hours 01/11/19 0910 01/17/19 1904   01/12/19 1600  sulfamethoxazole-trimethoprim (BACTRIM) 400-80 MG per tablet 1 tablet    Note to Pharmacy: Take one tablet by mouth on Monday, Wednesday and fridays.     1 tablet Oral  Every M-W-F 01/12/19 1449     01/11/19 0830  vancomycin (VANCOCIN) IVPB 1000 mg/200 mL premix  Status:  Discontinued     1,000 mg 200 mL/hr over 60 Minutes Intravenous  Once 01/11/19 0825 01/11/19 0901   01/11/19 0830  ceFEPIme (MAXIPIME) 2 g in sodium chloride 0.9 % 100 mL IVPB     2 g 200 mL/hr over 30 Minutes Intravenous  Once 01/11/19 0825 01/11/19 0952     Subjective: A&O Asking about going home  Objective: Vitals:   01/22/19 1445 01/22/19 1500 01/22/19 1515 01/22/19 1523  BP: 131/64 139/62 121/69 (!) 142/74  Pulse: (!) 103 99 98 95  Resp: (!) 37 (!) 24 (!) 30 (!) 31  Temp:    98.5 F (36.9 C)  TempSrc:    Oral  SpO2: 91% 90% 93% 93%   Weight:    48.5 kg  Height:        Intake/Output Summary (Last 24 hours) at 01/22/2019 1554 Last data filed at 01/22/2019 1523 Gross per 24 hour  Intake 3344.08 ml  Output 1000 ml  Net 2344.08 ml   Filed Weights   01/22/19 0323 01/22/19 1245 01/22/19 1523  Weight: 49.6 kg 48.5 kg 48.5 kg    Examination:  General: No acute distress. Cardiovascular: RRR Lungs: unlabored, with [place] in place Abdomen: Soft, nontender, nondistended Neurological: Alert and oriented 3. Moves all extremities 4. Cranial nerves II through XII grossly intact. Skin: Warm and dry. No rashes or lesions. Extremities: No clubbing or cyanosis. No edema.  Data Reviewed: I have personally reviewed following labs and imaging studies  CBC: Recent Labs  Lab 01/18/19 0215 01/19/19 0450 01/20/19 0442 01/21/19 0235 01/21/19 1458 01/22/19 0215  WBC 8.7 8.8 7.4 7.7  --  8.2  NEUTROABS 7.7 7.6 6.4 6.7  --  6.6  HGB 9.3* 10.3* 9.1* 8.0* 7.8* 7.5*  HCT 27.7* 30.5* 27.6* 22.9* 23.0* 22.6*  MCV 85.2 84.3 85.7 83.0  --  85.9  PLT 146* 154 137* 119*  --  123XX123*   Basic Metabolic Panel: Recent Labs  Lab 01/18/19 0215 01/19/19 0450 01/20/19 0442 01/21/19 0235 01/22/19 0215  NA 150* 153* 150* 139 140  K 3.2* 4.2 3.8 3.7 3.1*  CL 112* 120* 116* 111 107  CO2 22 21* 19* 16* 18*  GLUCOSE 236* 206* 164* 232* 163*  BUN 128* 142* 122* 127* 136*  CREATININE 5.79* 5.78* 5.66* 5.82* 5.64*  CALCIUM 9.0 8.9 8.5* 7.6* 7.0*  MG 2.2 2.2 1.9 1.7 1.6*  PHOS 4.5 5.4* 5.3* 5.6* 5.1*   GFR: Estimated Creatinine Clearance: 8.2 mL/min (A) (by C-G formula based on SCr of 5.64 mg/dL (H)). Liver Function Tests: Recent Labs  Lab 01/18/19 0215 01/19/19 0450 01/20/19 0442 01/21/19 0235 01/22/19 0215  AST 45* 25 22 21  49*  ALT 41 31 24 19  49*  ALKPHOS 94 89 81 71 87  BILITOT 0.6 0.7 0.8 1.0 0.6  PROT 7.9 8.1 7.4 6.2* 5.9*  ALBUMIN 2.4* 2.6* 2.3* 1.8* 1.7*   No results for input(s): LIPASE, AMYLASE in the last 168 hours.  No results for input(s): AMMONIA in the last 168 hours. Coagulation Profile: No results for input(s): INR, PROTIME in the last 168 hours. Cardiac Enzymes: No results for input(s): CKTOTAL, CKMB, CKMBINDEX, TROPONINI in the last 168 hours. BNP (last 3 results) No results for input(s): PROBNP in the last 8760 hours. HbA1C: No results for input(s): HGBA1C in the last 72 hours. CBG: Recent Labs  Lab 01/21/19 2100 01/22/19  [id number] 01/22/19 0319 01/22/19 0820 01/22/19 1221  GLUCAP 305* 147* 152* 102* 243*   Lipid Profile: No results for input(s): CHOL, HDL, LDLCALC, TRIG, CHOLHDL, LDLDIRECT in the last 72 hours. Thyroid Function Tests: No results for input(s): TSH, T4TOTAL, FREET4, T3FREE, THYROIDAB in the last 72 hours. Anemia Panel: Recent Labs    01/21/19 0235 01/22/19 0215  FERRITIN 1,037* 1,967*   Sepsis Labs: Recent Labs  Lab 01/16/19 0745 01/16/19 1045 01/17/19 0027 01/18/19 0215  PROCALCITON 9.60  --  11.99 21.96  LATICACIDVEN 1.5 1.1  --   --     Recent Results (from the past 240 hour(s))  Culture, blood (routine x 2)     Status: None   Collection Time: 01/16/19  7:45 AM   Specimen: BLOOD  Result Value Ref Range Status   Specimen Description   Final    BLOOD RIGHT ANTECUBITAL Performed at [facility], [place] [address] [place], [place] [address]    Special Requests   Final    BOTTLES DRAWN AEROBIC ONLY Blood Culture adequate volume Performed at [facility] [address], [place], [place] [address]    Culture   Final    NO GROWTH 5 DAYS Performed at [facility], [place] [address], [place], [place] [address]    Report Status 01/21/2019 FINAL  Final  Culture, blood (routine x 2)     Status: None   Collection Time: 01/16/19  7:50 AM   Specimen: BLOOD  Result Value Ref Range Status   Specimen Description   Final    BLOOD RIGHT HAND Performed at [facility] [address], [place], [place]  [address]    Special Requests   Final    BOTTLES DRAWN AEROBIC ONLY Blood Culture adequate volume Performed at [facility] [address] [place], [place] [address]    Culture   Final    NO GROWTH 5 DAYS Performed at [facility], [place] [address] [place], [place] [address]    Report Status 01/21/2019 FINAL  Final         Radiology Studies: Dg Chest Port 1 View  Result Date: 01/22/2019 CLINICAL DATA:  73 year old male with hypoxia. Positive COVID-19. EXAM: PORTABLE CHEST 1 VIEW COMPARISON:  Chest radiograph dated 01/17/2019. FINDINGS: A feeding tube is noted with tip beyond the inferior margin of the image. Bilateral confluent airspace opacities with overall slight improvement since the prior radiograph. Probable small left pleural effusion. No pneumothorax. Stable cardiac silhouette with atherosclerotic calcification of the aorta. No acute osseous pathology. IMPRESSION: 1. Slight interval improvement in the bilateral confluent airspace opacities compared to the prior radiograph. 2. Probable small left pleural effusion. Electronically Signed   By: Anner Crete M.D.   On: 01/22/2019 09:03        Scheduled Meds: . amLODipine  10 mg Oral Daily  . aspirin EC  81 mg Oral Daily  . Chlorhexidine Gluconate Cloth  6 each Topical Daily  . Chlorhexidine Gluconate Cloth  6 each Topical Q0600  . [START ON 01/23/2019] Chlorhexidine Gluconate Cloth  6 each Topical Q0600  . cloNIDine  0.2 mg Oral BID  . darbepoetin (ARANESP) injection - NON-DIALYSIS  150 mcg Subcutaneous Q Wed-1800  . feeding supplement (ENSURE ENLIVE)  237 mL Oral TID BM  . free water  200 mL Per Tube Q8H  . insulin aspart  0-9 Units Subcutaneous Q4H  . mouth rinse  15 mL Mouth Rinse BID  .  metoprolol tartrate  50 mg Oral BID  . mycophenolate  500 mg Oral BID  . pantoprazole (PROTONIX) IV  40 mg Intravenous Q12H  . predniSONE  10 mg Oral Q breakfast  . sodium chloride flush  3 mL Intravenous  Q12H  . sulfamethoxazole-trimethoprim  1 tablet Oral Q M,W,F  . tacrolimus  4 mg Oral BID   Continuous Infusions: . sodium chloride 10 mL/hr at 01/22/19 1400  . sodium chloride    . sodium chloride    . feeding supplement (VITAL AF 1.2 CAL) 55 mL/hr at 01/22/19 1400  .  sodium bicarbonate (isotonic) infusion in sterile water 75 mL/hr at 01/22/19 1500     LOS: 11 days    Time spent: over 30 min    Fayrene Helper, MD [facility] Pager AMION  If 7PM-7AM, please contact night-coverage [email or web address] Password [facility] 01/22/2019, 3:54 PM

## 2019-01-22 NOTE — Evaluation (Signed)
Clinical/Bedside Swallow Evaluation Patient Details  Name: Francisco Williamson MRN: [id number] Date of Birth: 11/11/1945  Today's Date: 01/22/2019 Time: SLP Start Time (ACUTE ONLY): 0945 SLP Stop Time (ACUTE ONLY): 1005 SLP Time Calculation (min) (ACUTE ONLY): 20 min  Past Medical History:  Past Medical History:  Diagnosis Date  . Hypertension   . Renal disorder    Past Surgical History:  Past Surgical History:  Procedure Laterality Date  . NEPHRECTOMY TRANSPLANTED ORGAN     HPI:  73 yo male presenting with AMS and failure to thrive, renal failure.Pt was admitted to [place] from 10/28-11/3 for COVID + and PNA. 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   Assessment / Plan / Recommendation Clinical Impression  Pt has a functional appearing swallow and shows no overt s/s of aspiration across challenging. He does endorse having had a sore throat for several days, but he does not endorse odynophagia today. Recommend starting regular solids and thin liquids. May not need to keep Cortrak in if his intake is sufficient. Please reorder SLP with any acute concerns.  SLP Visit Diagnosis: Dysphagia, unspecified (R13.10)    Aspiration Risk  No limitations    Diet Recommendation Regular;Thin liquid   Liquid Administration via: Cup;Straw Medication Administration: Whole meds with liquid Supervision: Patient able to self feed;Intermittent supervision to cue for compensatory strategies Compensations: Slow rate;Small sips/bites Postural Changes: Seated upright at 90 degrees    Other  Recommendations Oral Care Recommendations: Oral care BID   Follow up Recommendations None      Frequency and Duration            Prognosis Prognosis for Safe Diet Advancement: Good      Swallow Study   General HPI: 73 yo male presenting with AMS and failure to thrive, renal failure.Pt was admitted to [place] from 10/28-11/3 for COVID + and PNA. 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 Type of Study: Bedside Swallow Evaluation Previous Swallow Assessment: none in chart Diet Prior to this Study: NPO;NG Tube Temperature Spikes Noted: No Respiratory Status: Nasal cannula History of Recent Intubation: No Behavior/Cognition: Alert;Cooperative Oral Cavity Assessment: Within Functional Limits Oral Care Completed by SLP: No Oral Cavity - Dentition: Adequate natural dentition Vision: Functional for self-feeding Self-Feeding Abilities: Able to feed self Patient Positioning: Upright in bed Baseline Vocal Quality: Normal Volitional Swallow: Able to elicit    Oral/Motor/Sensory Function Overall Oral Motor/Sensory Function: Within functional limits   Ice Chips Ice chips: Not tested   Thin Liquid Thin Liquid: Within functional limits Presentation: Cup;Self Fed;Straw    Nectar Thick Nectar Thick Liquid: Not tested   Honey Thick Honey Thick Liquid: Not tested   Puree Puree: Within functional limits Presentation: Self Fed;Spoon   Solid     Solid: Within functional limits Presentation: [place] 01/22/2019,10:14 AM  Pollyann Glen, M.A. [place] Acute Environmental education officer [phone] Office [phone]

## 2019-01-23 ENCOUNTER — Encounter (HOSPITAL_COMMUNITY): Payer: PRIVATE HEALTH INSURANCE

## 2019-01-23 ENCOUNTER — Inpatient Hospital Stay (HOSPITAL_COMMUNITY): Payer: Medicare Other

## 2019-01-23 DIAGNOSIS — E43 Unspecified severe protein-calorie malnutrition: Secondary | ICD-10-CM | POA: Insufficient documentation

## 2019-01-23 DIAGNOSIS — K921 Melena: Secondary | ICD-10-CM

## 2019-01-23 LAB — COMPREHENSIVE METABOLIC PANEL
ALT: 41 U/L (ref 0–44)
AST: 37 U/L (ref 15–41)
Albumin: 1.5 g/dL — ABNORMAL LOW (ref 3.5–5.0)
Alkaline Phosphatase: 84 U/L (ref 38–126)
Anion gap: 10 (ref 5–15)
BUN: 70 mg/dL — ABNORMAL HIGH (ref 8–23)
CO2: 31 mmol/L (ref 22–32)
Calcium: 7 mg/dL — ABNORMAL LOW (ref 8.9–10.3)
Chloride: 98 mmol/L (ref 98–111)
Creatinine, Ser: 3.7 mg/dL — ABNORMAL HIGH (ref 0.61–1.24)
GFR calc Af Amer: 18 mL/min — ABNORMAL LOW (ref >60)
GFR calc non Af Amer: 16 mL/min — ABNORMAL LOW (ref >60)
Glucose, Bld: 116 mg/dL — ABNORMAL HIGH (ref 70–99)
Potassium: 2.7 mmol/L — CL (ref 3.5–5.1)
Sodium: 139 mmol/L (ref 135–145)
Total Bilirubin: 0.8 mg/dL (ref 0.3–1.2)
Total Protein: 5.6 g/dL — ABNORMAL LOW (ref 6.5–8.1)

## 2019-01-23 LAB — URINALYSIS, ROUTINE W REFLEX MICROSCOPIC
Bilirubin Urine: NEGATIVE
Glucose, UA: NEGATIVE mg/dL
Ketones, ur: NEGATIVE mg/dL
Leukocytes,Ua: NEGATIVE
Nitrite: NEGATIVE
Protein, ur: 100 mg/dL — AB
Specific Gravity, Urine: 1.011 (ref 1.005–1.030)
pH: 7 (ref 5.0–8.0)

## 2019-01-23 LAB — HEPATITIS B SURFACE ANTIBODY,QUALITATIVE: Hep B S Ab: NONREACTIVE

## 2019-01-23 LAB — CBC WITH DIFFERENTIAL/PLATELET
Abs Immature Granulocytes: 0.29 10*3/uL — ABNORMAL HIGH (ref 0.00–0.07)
Basophils Absolute: 0 10*3/uL (ref 0.0–0.1)
Basophils Relative: 0 %
Eosinophils Absolute: 0.3 10*3/uL (ref 0.0–0.5)
Eosinophils Relative: 3 %
HCT: 21.2 % — ABNORMAL LOW (ref 39.0–52.0)
Hemoglobin: 7.1 g/dL — ABNORMAL LOW (ref 13.0–17.0)
Immature Granulocytes: 3 %
Lymphocytes Relative: 16 %
Lymphs Abs: 1.3 10*3/uL (ref 0.7–4.0)
MCH: 28.1 pg (ref 26.0–34.0)
MCHC: 33.5 g/dL (ref 30.0–36.0)
MCV: 83.8 fL (ref 80.0–100.0)
Monocytes Absolute: 0.6 10*3/uL (ref 0.1–1.0)
Monocytes Relative: 7 %
Neutro Abs: 6 10*3/uL (ref 1.7–7.7)
Neutrophils Relative %: 71 %
Platelets: 118 10*3/uL — ABNORMAL LOW (ref 150–400)
RBC: 2.53 MIL/uL — ABNORMAL LOW (ref 4.22–5.81)
RDW: 16.9 % — ABNORMAL HIGH (ref 11.5–15.5)
WBC: 8.6 10*3/uL (ref 4.0–10.5)
nRBC: 0 % (ref 0.0–0.2)

## 2019-01-23 LAB — HEPATITIS B CORE ANTIBODY, TOTAL: Hep B Core Total Ab: NONREACTIVE

## 2019-01-23 LAB — GLUCOSE, CAPILLARY
Glucose-Capillary: 123 mg/dL — ABNORMAL HIGH (ref 70–99)
Glucose-Capillary: 122 mg/dL — ABNORMAL HIGH (ref 70–99)
Glucose-Capillary: 128 mg/dL — ABNORMAL HIGH (ref 70–99)
Glucose-Capillary: 238 mg/dL — ABNORMAL HIGH (ref 70–99)
Glucose-Capillary: 232 mg/dL — ABNORMAL HIGH (ref 70–99)

## 2019-01-23 LAB — HEMOGLOBIN AND HEMATOCRIT, BLOOD
HCT: 19.5 % — ABNORMAL LOW (ref 39.0–52.0)
HCT: 24.6 % — ABNORMAL LOW (ref 39.0–52.0)
Hemoglobin: 6.8 g/dL — CL (ref 13.0–17.0)
Hemoglobin: 8.4 g/dL — ABNORMAL LOW (ref 13.0–17.0)

## 2019-01-23 LAB — C-REACTIVE PROTEIN: CRP: 9.7 mg/dL — ABNORMAL HIGH (ref <1.0)

## 2019-01-23 LAB — MRSA PCR SCREENING: MRSA by PCR: POSITIVE — AB

## 2019-01-23 LAB — POCT I-STAT 7, (LYTES, BLD GAS, ICA,H+H)
Acid-Base Excess: 9 mmol/L — ABNORMAL HIGH (ref 0.0–2.0)
Bicarbonate: 31.9 mmol/L — ABNORMAL HIGH (ref 20.0–28.0)
Calcium, Ion: 1.06 mmol/L — ABNORMAL LOW (ref 1.15–1.40)
HCT: 22 % — ABNORMAL LOW (ref 39.0–52.0)
Hemoglobin: 7.5 g/dL — ABNORMAL LOW (ref 13.0–17.0)
O2 Saturation: 97 %
Potassium: 3.2 mmol/L — ABNORMAL LOW (ref 3.5–5.1)
Sodium: 138 mmol/L (ref 135–145)
TCO2: 33 mmol/L — ABNORMAL HIGH (ref 22–32)
pCO2 arterial: 36.2 mmHg (ref 32.0–48.0)
pH, Arterial: 7.553 — ABNORMAL HIGH (ref 7.350–7.450)
pO2, Arterial: 77 mmHg — ABNORMAL LOW (ref 83.0–108.0)

## 2019-01-23 LAB — HEPATITIS B SURFACE ANTIGEN: Hepatitis B Surface Ag: NONREACTIVE

## 2019-01-23 LAB — FERRITIN: Ferritin: 1567 ng/mL — ABNORMAL HIGH (ref 24–336)

## 2019-01-23 LAB — MAGNESIUM: Magnesium: 1.6 mg/dL — ABNORMAL LOW (ref 1.7–2.4)

## 2019-01-23 LAB — LACTIC ACID, PLASMA
Lactic Acid, Venous: 1 mmol/L (ref 0.5–1.9)
Lactic Acid, Venous: 0.9 mmol/L (ref 0.5–1.9)

## 2019-01-23 LAB — HEPATITIS C ANTIBODY: HCV Ab: NONREACTIVE

## 2019-01-23 LAB — PHOSPHORUS: Phosphorus: 2.9 mg/dL (ref 2.5–4.6)

## 2019-01-23 LAB — PROCALCITONIN: Procalcitonin: 2.24 ng/mL

## 2019-01-23 LAB — PREPARE RBC (CROSSMATCH)

## 2019-01-23 LAB — D-DIMER, QUANTITATIVE: D-Dimer, Quant: 3.18 ug/mL-FEU — ABNORMAL HIGH (ref 0.00–0.50)

## 2019-01-23 MED ORDER — POTASSIUM CHLORIDE 10 MEQ/100ML IV SOLN
10.0000 meq | INTRAVENOUS | Status: AC
  Administered 2019-01-23 (×4): 10 meq via INTRAVENOUS
  Filled 2019-01-23 (×4): qty 100

## 2019-01-23 MED ORDER — VANCOMYCIN VARIABLE DOSE PER UNSTABLE RENAL FUNCTION (PHARMACIST DOSING): Status: DC

## 2019-01-23 MED ORDER — MYCOPHENOLATE 200 MG/ML ORAL SUSPENSION
500.0000 mg | Freq: Two times a day (BID) | ORAL | Status: DC
  Administered 2019-01-24 – 2019-01-28 (×7): 500 mg via ORAL
  Filled 2019-01-23 (×15): qty 10

## 2019-01-23 MED ORDER — VANCOMYCIN HCL IN DEXTROSE 1-5 GM/200ML-% IV SOLN
1000.0000 mg | Freq: Once | INTRAVENOUS | Status: AC
  Administered 2019-01-23: 1000 mg via INTRAVENOUS
  Filled 2019-01-23 (×2): qty 200

## 2019-01-23 MED ORDER — MUPIROCIN 2 % EX OINT
TOPICAL_OINTMENT | Freq: Two times a day (BID) | CUTANEOUS | Status: DC
  Administered 2019-01-23: 1 via NASAL
  Administered 2019-01-24 – 2019-01-30 (×11): via NASAL
  Administered 2019-01-30: 1 via NASAL
  Administered 2019-01-31 – 2019-02-01 (×3): via NASAL
  Administered 2019-02-02: 1 via NASAL
  Administered 2019-02-02 – 2019-02-06 (×8): via NASAL
  Filled 2019-01-23 (×8): qty 22

## 2019-01-23 MED ORDER — ALTEPLASE 2 MG IJ SOLR
2.0000 mg | Freq: Once | INTRAMUSCULAR | Status: DC | PRN
  Filled 2019-01-23: qty 2

## 2019-01-23 MED ORDER — SODIUM CHLORIDE 0.9% IV SOLUTION: Freq: Once | INTRAVENOUS | Status: DC

## 2019-01-23 MED ORDER — METHYLPREDNISOLONE SODIUM SUCC 125 MG IJ SOLR
40.0000 mg | Freq: Two times a day (BID) | INTRAMUSCULAR | Status: DC
  Administered 2019-01-23 – 2019-01-25 (×5): 40 mg via INTRAVENOUS
  Filled 2019-01-23 (×5): qty 2

## 2019-01-23 MED ORDER — SODIUM CHLORIDE 0.9 % IV SOLN
1.0000 g | Freq: Once | INTRAVENOUS | Status: AC
  Administered 2019-01-23: 1 g via INTRAVENOUS
  Filled 2019-01-23: qty 1

## 2019-01-23 MED ORDER — SODIUM CHLORIDE 0.9 % IV SOLN
1.0000 g | INTRAVENOUS | Status: AC
  Administered 2019-01-24 – 2019-01-29 (×6): 1 g via INTRAVENOUS
  Filled 2019-01-23 (×7): qty 1

## 2019-01-23 NOTE — Progress Notes (Signed)
CRITICAL VALUE ALERT  Critical Value:  hgb 6.8  Date & Time Notied: 11/20 12:13  Provider Notified: Dr. Florene Glen   Orders Received/Actions taken: see orders for blood admin

## 2019-01-23 NOTE — Progress Notes (Signed)
OT Cancellation Note  Patient Details Name: Francisco Williamson MRN: [id number] DOB: 02/17/1946   Cancelled Treatment:    Reason Eval/Treat Not Completed: Medical issues which prohibited therapy;Patient's level of consciousness. At this time Pt only responding to painful stimulus, RN asking therapy to hold for status update/change in arousal.  Merri Ray Tymika Grilli 01/23/2019, 4:16 PM   Hulda Humphrey OTR/L Acute Rehabilitation Services Pager: [phone] Office: [phone]

## 2019-01-23 NOTE — Progress Notes (Signed)
Palliative: Chart reviewed.  Mr. Baldry remains in isolation in the intensive care.  Hemodialysis is at bedside at this time.  There is no family present.  Conference with bedside nursing and attending related to patient condition, needs.  If no improvements within the next 12 to 24 hours, anticipate transition to comfort care.  Attending to manage comfort care.   Plan: 12 to 24 hours for outcomes.  Likely transition to comfort care if no improvement.  40 minutes Quinn Axe, NP Palliative Medicine Team Team Phone # [phone] Greater than 50% of this time was spent counseling and coordinating care related to the above assessment and plan.

## 2019-01-23 NOTE — Progress Notes (Signed)
Subjective:  Pt did not want to do dialysis yesterday but I talked him into it in an effort to get him better- tolerated fine-  BUN down to 70 this AM, K is also low   Objective Vital signs in last 24 hours: Vitals:   01/23/19 0347 01/23/19 0400 01/23/19 0500 01/23/19 0600  BP:  (!) 169/63 (!) 164/62 (!) 160/66  Pulse:  88 87 88  Resp:  (!) 27 (!) 27 (!) 22  Temp: 99.1 F (37.3 C)     TempSrc: Oral     SpO2:  94% 95% 94%  Weight:      Height:       Weight change: -1.1 kg  Intake/Output Summary (Last 24 hours) at 01/23/2019 0805 Last data filed at 01/23/2019 0600 Gross per 24 hour  Intake 2590.76 ml  Output 1350 ml  Net 1240.76 ml    Assessment/ Plan: Pt is a 73 y.o. yo male s/p renal transplant but with CKD at baseline crt in the 3's who was admitted on 01/11/2019 with weakness after COVID treatment  Assessment/Plan: 1. Renal-  History of CKD and renal transplant.  A on chronic change due to illness and volume/free water depletion- now his free water deficit has been corrected but BUN was still relatively high.    since not thriving I have spoken with family and the patient- we would like to attempt a trial of HD to see if can clear MS more and improve outlook- for first short treatment 11/19-  Second today 11/20 2. S/p renal transplant-  Is on prograf and very low dose cellcept as well as decadron   3. Anemia- is on procrit normally for anemia as OP-  Supportive therapy - giving ESA 4.  Metabolic acidosis-  Was giving bicarb repletion - will stop now as is corrected with HD  5. HTN/volume-  Hypertensive.  continue with antihypertensivies 6.   Hypernatremia- corrected - backed off on free water repletion  7.  Hypokalemia-  Running on 4 K bath today - regular diet but also giving some today    Louis Meckel    Labs: Basic Metabolic Panel: Recent Labs  Lab 01/21/19 0235 01/22/19 0215 01/23/19 0336  NA 139 140 139  K 3.7 3.1* 2.7*  CL 111 107 98  CO2 16* 18*  31  GLUCOSE 232* 163* 116*  BUN 127* 136* 70*  CREATININE 5.82* 5.64* 3.70*  CALCIUM 7.6* 7.0* 7.0*  PHOS 5.6* 5.1* 2.9   Liver Function Tests: Recent Labs  Lab 01/21/19 0235 01/22/19 0215 01/23/19 0336  AST 21 49* 37  ALT 19 49* 41  ALKPHOS 71 87 84  BILITOT 1.0 0.6 0.8  PROT 6.2* 5.9* 5.6*  ALBUMIN 1.8* 1.7* 1.5*   No results for input(s): LIPASE, AMYLASE in the last 168 hours. No results for input(s): AMMONIA in the last 168 hours. CBC: Recent Labs  Lab 01/19/19 0450 01/20/19 0442 01/21/19 0235  01/22/19 0215 01/22/19 1556 01/23/19 0336  WBC 8.8 7.4 7.7  --  8.2  --  8.6  NEUTROABS 7.6 6.4 6.7  --  6.6  --  6.0  HGB 10.3* 9.1* 8.0*   < > 7.5* 7.7* 7.1*  HCT 30.5* 27.6* 22.9*   < > 22.6* 21.5* 21.2*  MCV 84.3 85.7 83.0  --  85.9  --  83.8  PLT 154 137* 119*  --  117*  --  118*   < > = values in this interval not displayed.  Cardiac Enzymes: No results for input(s): CKTOTAL, CKMB, CKMBINDEX, TROPONINI in the last 168 hours. CBG: Recent Labs  Lab 01/22/19 1221 01/22/19 1623 01/22/19 1934 01/22/19 2343 01/23/19 0342  GLUCAP 243* 138* 153* 105* 123*    Iron Studies:  Recent Labs    01/22/19 1631 01/23/19 0336  IRON 69  --   TIBC NOT CALCULATED  --   FERRITIN  --  1,567*   Studies/Results: Dg Chest Port 1 View  Result Date: 01/22/2019 CLINICAL DATA:  73 year old male with hypoxia. Positive COVID-19. EXAM: PORTABLE CHEST 1 VIEW COMPARISON:  Chest radiograph dated 01/17/2019. FINDINGS: A feeding tube is noted with tip beyond the inferior margin of the image. Bilateral confluent airspace opacities with overall slight improvement since the prior radiograph. Probable small left pleural effusion. No pneumothorax. Stable cardiac silhouette with atherosclerotic calcification of the aorta. No acute osseous pathology. IMPRESSION: 1. Slight interval improvement in the bilateral confluent airspace opacities compared to the prior radiograph. 2. Probable small left  pleural effusion. Electronically Signed   By: Anner Crete M.D.   On: 01/22/2019 09:03   Medications: Infusions: . sodium chloride 10 mL/hr at 01/22/19 1400  . sodium chloride    . sodium chloride    . feeding supplement (VITAL AF 1.2 CAL) 1,000 mL (01/22/19 1956)  . potassium chloride 10 mEq (01/23/19 0624)  .  sodium bicarbonate (isotonic) infusion in sterile water 75 mL/hr at 01/23/19 0600    Scheduled Medications: . amLODipine  10 mg Oral Daily  . aspirin EC  81 mg Oral Daily  . Chlorhexidine Gluconate Cloth  6 each Topical Q0600  . cloNIDine  0.2 mg Oral BID  . darbepoetin (ARANESP) injection - NON-DIALYSIS  150 mcg Subcutaneous Q Wed-1800  . feeding supplement (ENSURE ENLIVE)  237 mL Oral TID BM  . folic acid  1 mg Oral Daily  . free water  200 mL Per Tube Q8H  . insulin aspart  0-9 Units Subcutaneous Q4H  . magnesium oxide  400 mg Oral BID WC  . mouth rinse  15 mL Mouth Rinse BID  . metoprolol tartrate  50 mg Oral BID  . mycophenolate  500 mg Oral BID  . pantoprazole (PROTONIX) IV  40 mg Intravenous Q12H  . predniSONE  10 mg Oral Q breakfast  . sodium chloride flush  3 mL Intravenous Q12H  . sulfamethoxazole-trimethoprim  1 tablet Oral Q M,W,F  . tacrolimus  4 mg Oral BID    have reviewed scheduled and prn medications.  Physical Exam: PE not performed as patient in isolation for COVD in order to preserve PPE and to avoid exposure to more providers.  Case discussed with CCM  01/23/2019,8:05 AM  LOS: 12 days

## 2019-01-23 NOTE — Progress Notes (Addendum)
PROGRESS NOTE    Francisco Williamson  [id number] DOB: [birth year] DOA: 01/11/2019 PCP: Default, Provider, MD   Brief Narrative:  73 y.o.BM PMHx Essential HTN and CKD stage V (s/p renal transplantation), deconditioning,  Presenting with AMS. He was admitted to [facility] from 10/28-11/3 for COVID-19-associated PNA with acute respiratory failure with hypoxia. He completed Killian Schwer course of Remdesivir and steroids, and he was discharged home on 11/3, patient was brought back to ED, given failure to thrive, dehydration and altered mental status, he was noted to have significant dehydration with hypernatremia and renal failure, he was transferred to [facility] for further management.  Transferred to [facility] on 11/17 due to possible need for HD.Marland Kitchen  Assessment & Plan:   Principal Problem:   Acute renal failure superimposed on chronic kidney disease ([place]) Active Problems:   CKD stage 5 secondary to hypertension ([place])   Pneumonia due to COVID-19 virus   Essential hypertension   History of renal transplant   Severe sepsis ([facility])   Sepsis secondary to UTI ([place])   HCAP (healthcare-associated pneumonia)   Bilateral pleural effusion   Acute hypernatremia   Goals of care, counseling/discussion   Palliative care by specialist   Protein-calorie malnutrition, severe  Addendum: Called by nursing.  Pt progressively lethargic.  Came to bedside, pt responsive to painful stimuli.  He's getting blood with HD at this time, but abx haven't been started.  Will stop HD to give abx (discussed with nephrology).  I discussed with family poor prognosis for Francisco Williamson.  They understand and if he does not improve, they want to focus on his comfort.  Discussed with palliative as well who agreed.   Recurrent Sepsis 2/2 Suspected HCAP?  HCAP/Covid 19 pneumonia  Acute Hypoxic Respiratory Failure - continued O2 requirement today, unable to be weaned - fever and AMS - CXR with worsening bilateral pneumonia on 11/20 - concern for  sepsis, could he be aspirating? - restart steroids, broad spectrum abx, and follow blood and urine cx - no bolus given CKD requiring dialysis at this time with stable vitals- will attempt chest CT when able - Blood cx, UA/urine cx, ABG, lactate, procalcitonin -On previous admission patient completed treatment of remdesivir+ steroids -He's s/p treatment for HCAP with 7 day course cefepime - CXR from 11/14 without significant change with diffuse interstitial and herterogenous airspace opacity - CXR from 11/19 with slight interval improvement in bilateral confluent airspace opacities compared to prior CXR - he's s/p repeat course of dex, remdesivir, and convalescent plasma (pt worsened on 11/14 and was restarted on treatment for COVID 19)  COVID-19 Labs  Recent Labs    01/21/19 0235 01/22/19 0215 01/23/19 0336  DDIMER 2.25* 2.46* 3.18*  FERRITIN 1,037* 1,967* 1,567*  CRP 11.4* 8.9* 9.7*    Lab Results  Component Value Date   SARSCOV2NAA POSITIVE ([facility]) 12/31/2018    Acute on CKD stage V/RENAL transplant patient (Cr = 3.5)  NAGMA  Hypernatremia - creatinine worsened since admission (baseline appears in 3-4 range - most recently ~3.58 on 11/3) - he received lasix/albumin earlier in course of hospitalization - s/p dialysis on 11/19 - creatinine 3.7 today - Appreciate renal assistance - dialysis today again  - AKI on CKD thought related to illness and volume/free water depletion.  -I/O, daily weights - Continue prograf and cellcept as well as bactrim ppx - bicarb and free water per renal  Sepsis due to staph epidermidis UTI -Afebrile, negative leukocytosis.  With his renal function, the doses of vanc he  received should have covered him  Bilateral pleural effusion/Hypoalbuminemia -See acute on CKD stage V  Essential HTN -Amlodipine 10 mg daily -Clonidine 0.2 mg BID -Hydralazine PRN -Labetalol PRN -11/13 increase Metoprolol 50 mg BID  GI bleed -11/10 PN notes small amount of  blood with stool - Occult blood pending collection - this was positive - DVT ppx was held, but now restarted on 11/17 - hold DVT ppx  - Continue to follow H/H today, transfuse for <7 - hemoccult positive - will consult GI (discussed with Dr. Henrene Pastor on 11/19) -11/13 transfuse 1 unit PRBC -Transfuse for hemoglobin<7   Hypernatremia - improved, appreciate renal assistance -See renal failure  Hypokalemia -See renal failure - K per renal  Hypomagnesmia - PO mag, follow  Hyperglycemia -Sensitive SSI - A1c is 4.2  Diarrhea -Patient unaware of the BMs -Would also account for his hypokalemia. -Imodium -11/16 discontinue Sorbitol, bisacodyl, MiraLAX  Failure to thrive -Patient unable to care for himself at home.  Discharged on 11/3 and readmitted on 11/8.  Patient now with worsening acute on CKD stage V.  Clear that if patient goes home instead of to [facility] is at high risk for complete renal failure of his transplanted kidney. -11/16 requested placement of CorTrak tube - SLP eval today note ok for regular/thin liquids - follow intake   Goals of Care: per palliative care note on 11/18, Francisco Williamson was agreeable to HD for Francisco [facility] few days.  If he improves, plan for rehab.  Consider home hospice if he fails to improve.  11/19 Francisco Williamson was focused on getting home and wanting to leave (he was adamant about this at one point), but after discussion with renal about dialysis he decided to stay.  Will continue to discuss goc with pt and family. He's worsened today and will need to discuss with family.   DVT prophylaxis: heparin  Code Status: DNR Family Communication: none at bedside - discussed with niece 11/19 Disposition Plan: pending    Consultants:   Renal   Procedures:   Cortrak placement on 11/17 Echo IMPRESSIONS    1. Left ventricular ejection fraction, by visual estimation, is 60 to 65%. The left ventricle has normal function. Left ventricular septal wall thickness was  mildly increased. Mildly increased left ventricular posterior wall thickness. There is mildly  increased left ventricular hypertrophy.  2. Left ventricular diastolic parameters are consistent with Grade I diastolic dysfunction (impaired relaxation).  3. Global right ventricle has normal systolic function.The right ventricular size is normal. No increase in right ventricular wall thickness.  4. Left atrial size was severely dilated.  5. Right atrial size was mildly dilated.  6. The mitral valve is normal in structure. No evidence of mitral valve regurgitation. No evidence of mitral stenosis.  7. The tricuspid valve is normal in structure. Tricuspid valve regurgitation is mild.  8. The aortic valve is grossly normal. Aortic valve regurgitation is not visualized. No evidence of aortic valve sclerosis or stenosis.  9. The pulmonic valve was normal in structure. Pulmonic valve regurgitation is not visualized. 10. Mildly elevated pulmonary artery systolic pressure. 11. The inferior vena cava is normal in size with greater than 50% respiratory variability, suggesting right atrial pressure of 3 mmHg.  Antimicrobials:  Anti-infectives (From admission, onward)   Start     Dose/Rate Route Frequency Ordered Stop   01/17/19 1300  remdesivir 100 mg in sodium chloride 0.9 % 250 mL IVPB     100 mg 500 mL/hr over 30 Minutes  Intravenous Daily 01/17/19 1144 01/21/19 1106   01/16/19 1800  vancomycin (VANCOCIN) 500 mg in sodium chloride 0.9 % 100 mL IVPB     500 mg 100 mL/hr over 60 Minutes Intravenous  Once 01/13/19 1758 01/16/19 2323   01/13/19 1900  vancomycin (VANCOCIN) IVPB 1000 mg/200 mL premix     1,000 mg 200 mL/hr over 60 Minutes Intravenous  Once 01/13/19 1758 01/13/19 2200   01/12/19 1800  ceFEPIme (MAXIPIME) 1 g in sodium chloride 0.9 % 100 mL IVPB     1 g 200 mL/hr over 30 Minutes Intravenous Every 24 hours 01/11/19 0910 01/17/19 1904   01/12/19 1600  sulfamethoxazole-trimethoprim (BACTRIM)  400-80 MG per tablet 1 tablet    Note to Pharmacy: Take one tablet by mouth on Monday, Wednesday and fridays.     1 tablet Oral Every M-W-F 01/12/19 1449     01/11/19 0830  vancomycin (VANCOCIN) IVPB 1000 mg/200 mL premix  Status:  Discontinued     1,000 mg 200 mL/hr over 60 Minutes Intravenous  Once 01/11/19 0825 01/11/19 0901   01/11/19 0830  ceFEPIme (MAXIPIME) 2 g in sodium chloride 0.9 % 100 mL IVPB     2 g 200 mL/hr over 30 Minutes Intravenous  Once 01/11/19 0825 01/11/19 0952     Subjective: Speaking quietly, difficult to understand Lethargic   Objective: Vitals:   01/23/19 0800 01/23/19 0852 01/23/19 0900 01/23/19 1000  BP: (!) 188/65 (!) 141/52 (!) 150/60 (!) 131/54  Pulse: 98 90 89 91  Resp: (!) 22 (!) 33 (!) 28 (!) 26  Temp: (!) 100.6 F (38.1 C)     TempSrc: Axillary     SpO2: 92% 94% 96% 99%  Weight:      Height:        Intake/Output Summary (Last 24 hours) at 01/23/2019 1012 Last data filed at 01/23/2019 0600 Gross per 24 hour  Intake 2333.83 ml  Output 1350 ml  Net 983.83 ml   Filed Weights   01/22/19 1245 01/22/19 1523 01/23/19 0312  Weight: 48.5 kg 48.5 kg 48.4 kg    Examination:  General: No acute distress. Cardiovascular: RRR Lungs: tachypneic, slightly increased WOB, course breath sounds Abdomen: Soft, nontender, nondistended  Neurological: lethargic, speaks quietly, follows commands. Moves all extremities 4. Cranial nerves II through XII grossly intact. Skin: Warm and dry. No rashes or lesions. Extremities: No clubbing or cyanosis. No edema.   Data Reviewed: I have personally reviewed following labs and imaging studies  CBC: Recent Labs  Lab 01/19/19 0450 01/20/19 0442 01/21/19 0235 01/21/19 1458 01/22/19 0215 01/22/19 1556 01/23/19 0336  WBC 8.8 7.4 7.7  --  8.2  --  8.6  NEUTROABS 7.6 6.4 6.7  --  6.6  --  6.0  HGB 10.3* 9.1* 8.0* 7.8* 7.5* 7.7* 7.1*  HCT 30.5* 27.6* 22.9* 23.0* 22.6* 21.5* 21.2*  MCV 84.3 85.7 83.0  --  85.9   --  83.8  PLT 154 137* 119*  --  117*  --  123456*   Basic Metabolic Panel: Recent Labs  Lab 01/19/19 0450 01/20/19 0442 01/21/19 0235 01/22/19 0215 01/23/19 0336  NA 153* 150* 139 140 139  K 4.2 3.8 3.7 3.1* 2.7*  CL 120* 116* 111 107 98  CO2 21* 19* 16* 18* 31  GLUCOSE 206* 164* 232* 163* 116*  BUN 142* 122* 127* 136* 70*  CREATININE 5.78* 5.66* 5.82* 5.64* 3.70*  CALCIUM 8.9 8.5* 7.6* 7.0* 7.0*  MG 2.2 1.9 1.7 1.6* 1.6*  PHOS 5.4* 5.3* 5.6* 5.1* 2.9   GFR: Estimated Creatinine Clearance: 12.5 mL/min (Kohan Azizi) (by C-G formula based on SCr of 3.7 mg/dL (H)). Liver Function Tests: Recent Labs  Lab 01/19/19 0450 01/20/19 0442 01/21/19 0235 01/22/19 0215 01/23/19 0336  AST 25 22 21  49* 37  ALT 31 24 19  49* 41  ALKPHOS 89 81 71 87 84  BILITOT 0.7 0.8 1.0 0.6 0.8  PROT 8.1 7.4 6.2* 5.9* 5.6*  ALBUMIN 2.6* 2.3* 1.8* 1.7* 1.5*   No results for input(s): LIPASE, AMYLASE in the last 168 hours. No results for input(s): AMMONIA in the last 168 hours. Coagulation Profile: No results for input(s): INR, PROTIME in the last 168 hours. Cardiac Enzymes: No results for input(s): CKTOTAL, CKMB, CKMBINDEX, TROPONINI in the last 168 hours. BNP (last 3 results) No results for input(s): PROBNP in the last 8760 hours. HbA1C: No results for input(s): HGBA1C in the last 72 hours. CBG: Recent Labs  Lab 01/22/19 1221 01/22/19 1623 01/22/19 1934 01/22/19 2343 01/23/19 0342  GLUCAP 243* 138* 153* 105* 123*   Lipid Profile: No results for input(s): CHOL, HDL, LDLCALC, TRIG, CHOLHDL, LDLDIRECT in the last 72 hours. Thyroid Function Tests: No results for input(s): TSH, T4TOTAL, FREET4, T3FREE, THYROIDAB in the last 72 hours. Anemia Panel: Recent Labs    01/22/19 0215 01/22/19 1631 01/23/19 0336  VITAMINB12  --  381  --   FOLATE  --  4.0*  --   FERRITIN 1,967*  --  1,567*  TIBC  --  NOT CALCULATED  --   IRON  --  69  --    Sepsis Labs: Recent Labs  Lab 01/16/19 1045 01/17/19 0027  01/18/19 0215  PROCALCITON  --  11.99 21.96  LATICACIDVEN 1.1  --   --     Recent Results (from the past 240 hour(s))  Culture, blood (routine x 2)     Status: None   Collection Time: 01/16/19  7:45 AM   Specimen: BLOOD  Result Value Ref Range Status   Specimen Description   Final    BLOOD RIGHT ANTECUBITAL Performed at [facility], [place] [address] [place], [place] [address]    Special Requests   Final    BOTTLES DRAWN AEROBIC ONLY Blood Culture adequate volume Performed at [place] [address], [place], [place] [address]    Culture   Final    NO GROWTH 5 DAYS Performed at [facility], [place] [address], [place], [place] [address]    Report Status 01/21/2019 FINAL  Final  Culture, blood (routine x 2)     Status: None   Collection Time: 01/16/19  7:50 AM   Specimen: BLOOD  Result Value Ref Range Status   Specimen Description   Final    BLOOD RIGHT HAND Performed at [place] [address] [place], [place] [address]    Special Requests   Final    BOTTLES DRAWN AEROBIC ONLY Blood Culture adequate volume Performed at [place] [address] [place], [place] [address]    Culture   Final    NO GROWTH 5 DAYS Performed at [facility], [place] [address], [place], [place] [address]    Report Status 01/21/2019 FINAL  Final         Radiology Studies: Dg Chest Port 1 View  Result Date: 01/23/2019 CLINICAL DATA:  Shortness of breath.  COVID-19 positive EXAM: PORTABLE CHEST 1 VIEW COMPARISON:  Administrator, [employer]  FINDINGS: Extensive bilateral airspace disease that has worsened. Feeding tube which at least reaches the stomach. Cardiomegaly. No visible effusion or pneumothorax. IMPRESSION: Worsening bilateral pneumonia. Electronically Signed   By: Monte Fantasia M.D.   On: 01/23/2019 09:38   Dg Chest Port 1 View  Result Date: 01/22/2019 CLINICAL DATA:  73 year old male with hypoxia.  Positive COVID-19. EXAM: PORTABLE CHEST 1 VIEW COMPARISON:  Chest radiograph dated 01/17/2019. FINDINGS: Maryfrances Portugal feeding tube is noted with tip beyond the inferior margin of the image. Bilateral confluent airspace opacities with overall slight improvement since the prior radiograph. Probable small left pleural effusion. No pneumothorax. Stable cardiac silhouette with atherosclerotic calcification of the aorta. No acute osseous pathology. IMPRESSION: 1. Slight interval improvement in the bilateral confluent airspace opacities compared to the prior radiograph. 2. Probable small left pleural effusion. Electronically Signed   By: Anner Crete M.D.   On: 01/22/2019 09:03        Scheduled Meds: . amLODipine  10 mg Oral Daily  . aspirin EC  81 mg Oral Daily  . Chlorhexidine Gluconate Cloth  6 each Topical Q0600  . cloNIDine  0.2 mg Oral BID  . darbepoetin (ARANESP) injection - NON-DIALYSIS  150 mcg Subcutaneous Q Wed-1800  . feeding supplement (ENSURE ENLIVE)  237 mL Oral TID BM  . folic acid  1 mg Oral Daily  . free water  200 mL Per Tube Q8H  . insulin aspart  0-9 Units Subcutaneous Q4H  . magnesium oxide  400 mg Oral BID WC  . mouth rinse  15 mL Mouth Rinse BID  . methylPREDNISolone (SOLU-MEDROL) injection  40 mg Intravenous Q12H  . metoprolol tartrate  50 mg Oral BID  . mycophenolate  500 mg Oral BID  . pantoprazole (PROTONIX) IV  40 mg Intravenous Q12H  . sodium chloride flush  3 mL Intravenous Q12H  . sulfamethoxazole-trimethoprim  1 tablet Oral Q M,W,F  . tacrolimus  4 mg Oral BID   Continuous Infusions: . sodium chloride 10 mL/hr at 01/22/19 1400  . sodium chloride    . sodium chloride    . feeding supplement (VITAL AF 1.2 CAL) 1,000 mL (01/22/19 1956)  . potassium chloride 10 mEq (01/23/19 0912)     LOS: 12 days    Time spent: over 30 min    Fayrene Helper, MD [facility] Pager AMION  If 7PM-7AM, please contact night-coverage [email or web address] Password [email or web address]  01/23/2019, 10:12 AM

## 2019-01-23 NOTE — Progress Notes (Signed)
Pharmacy Antibiotic Note  Francisco Williamson is a 72 y.o. male admitted on 01/11/2019 with pneumonia.  Pharmacy has been consulted for Vancomycin and Cefepime dosing. Patient has CKD stage V (s/p renal transplantation).   CXR 11/20 with worsening bilateral pneumonia and increased oxygen requirements. Patient has fever (Tmax 100.6) and altered mental status. Cultures have been re-sent and re-broadening antibiotics. Pharmacy consulted to dose vancomycin and cefepime for HCAP. Current SCr is down to 3.70 after HD on 11/19 with plan for a second HD today after BFR 300 for 3 hours. Last dose of Vancomycin was on 11/10 and Cefepime was 11/14. Patient is making urine with 1.2 mL/kg/hr reported yesterday.   Plan: Cefepime 1 g IV every 24 hours due to no regular HD schedule at this time.  Vancomycin 1000 mg IV x1 then will plan for Vancomycin 500 mg IV post dialysis.  Monitor HD plans.   Height: 5\' 5"  (165.1 cm) Weight: 106 lb 11.2 oz (48.4 kg) IBW/kg (Calculated) : 61.5  Temp (24hrs), Avg:98.8 F (37.1 C), Min:97.6 F (36.4 C), Max:100.6 F (38.1 C)  Recent Labs  Lab 01/19/19 0450 01/20/19 0442 01/21/19 0235 01/22/19 0215 01/23/19 0336  WBC 8.8 7.4 7.7 8.2 8.6  CREATININE 5.78* 5.66* 5.82* 5.64* 3.70*    Estimated Creatinine Clearance: 12.5 mL/min (A) (by C-G formula based on SCr of 3.7 mg/dL (H)).    No Known Allergies  Antimicrobials this admission: Vancomycin 11/20 >> Cefepime 11/20 >>  Dose adjustments this admission:  Microbiology results: 11/20 BCx:  11/20 UCx:  11/20 MRSA PCR:   Thank you for allowing pharmacy to be a part of this patient's care.  Sloan Leiter, PharmD, BCPS, BCCCP Clinical Pharmacist Please refer to [facility] for [place] numbers 01/23/2019 11:04 AM

## 2019-01-23 NOTE — Progress Notes (Signed)
Pt taken off HD early per MD

## 2019-01-23 NOTE — Progress Notes (Signed)
Pt is more lethargic than this morning. Only responsive to painful stumuli. Dr. Florene Glen notified and will be coming to bedside. New orders for ABG to be done. O2sats 90% 4lpm, RR 30

## 2019-01-23 NOTE — Evaluation (Signed)
Clinical/Bedside Swallow Evaluation Patient Details  Name: Francisco Williamson MRN: [id number] Date of Birth: 08-27-1945  Today's Date: 01/23/2019 Time: SLP Start Time (ACUTE ONLY): 67 SLP Stop Time (ACUTE ONLY): 1145 SLP Time Calculation (min) (ACUTE ONLY): 15 min  Past Medical History:  Past Medical History:  Diagnosis Date  . Hypertension   . Renal disorder    Past Surgical History:  Past Surgical History:  Procedure Laterality Date  . NEPHRECTOMY TRANSPLANTED ORGAN     HPI:  73 yo male presenting with AMS and failure to thrive, renal failure.Pt was admitted to [facility] from 10/28-11/3 for COVID + and PNA. 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  Clinical swallow evaluation was completed on 11/19 - pt was communicative and swallow appeared to be [facility]; regular diet was recommended.  11/20 CXR with worsening bilateral pna; continued 02 requirement, fever and AMS. Swallow evaluation reordered.     Assessment / Plan / Recommendation Clinical Impression  Pt presents with a decline in MS, now lethargic and had difficulty participating in repeat swallow assessment. Arousable, but unable to pull liquid through a straw.  Demonstrated spillage from mouth with poor recognition; difficulty initiating a swallow response despite cues.  Terminated assessment.  Now with acute changes in swallow function - unable to safely eat.  Recommend NPO; continue cortrak feeds.  D/W RN.  SLP will follow for plan, readiness to resume POs.  SLP Visit Diagnosis: Dysphagia, unspecified (R13.10)    Aspiration Risk  Moderate aspiration risk    Diet Recommendation   NPO  Medication Administration: Via alternative means    Other  Recommendations Oral Care Recommendations: Oral care QID   Follow up Recommendations        Frequency and Duration min 2x/week  2 weeks       Prognosis Prognosis for Safe Diet Advancement: Fair      Swallow Study   General HPI: 72 yo male  presenting with AMS and failure to thrive, renal failure.Pt was admitted to [facility] from 10/28-11/3 for COVID + and PNA. 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  Clinical swallow evaluation was completed on 11/19 - pt was communicative and swallow appeared to be [facility]; regular diet was recommended.  11/20 CXR with worsening bilateral pna; continued 02 requirement, fever and AMS. Swallow evaluation reordered.   Type of Study: Bedside Swallow Evaluation Diet Prior to this Study: NG Tube Temperature Spikes Noted: Yes Respiratory Status: Nasal cannula History of Recent Intubation: No Behavior/Cognition: Lethargic/Drowsy Oral Cavity Assessment: Within Functional Limits Oral Care Completed by SLP: No Oral Cavity - Dentition: Adequate natural dentition Self-Feeding Abilities: Needs assist Patient Positioning: Upright in bed Baseline Vocal Quality: Normal Volitional Swallow: Able to elicit    Oral/Motor/Sensory Function Overall Oral Motor/Sensory Function: Within functional limits   Ice Chips Ice chips: Not tested   Thin Liquid Thin Liquid: Impaired Presentation: Straw;Cup Oral Phase Impairments: Reduced labial seal;Poor awareness of bolus Oral Phase Functional Implications: Right anterior spillage;Left anterior spillage Pharyngeal  Phase Impairments: Suspected delayed Swallow    Nectar Thick Nectar Thick Liquid: Not tested   Honey Thick Honey Thick Liquid: Not tested   Puree Puree: Not tested   Solid     Solid: Not tested      [name] 01/23/2019,12:58 PM   Estill Bamberg L. Tivis Ringer, [place] Office number [phone] Pager [phone]

## 2019-01-23 NOTE — Consult Note (Signed)
[facility] Consult: 11:56 AM 01/23/2019  LOS: 12 days    Referring Provider: Tyson Babinski. Primary Care Physician:  Default, Provider, MD Primary Gastroenterologist: Althia Forts. Renal: Dr [name] Patient coming from:  Home - lives alone; NOK:  Lavone Orn, [phone]; Niece, Kerin Perna, [phone] is geographically closest relative.  Daughter lives in [place].    Reason for Consultation:  Anemia.  FOBT +stool.     HPI: Francisco Williamson is a 73 y.o. male.  PMH renal cell CA, s/p left nephrectomy 2003.  Cystic renal dz.  ESRD.  S/p 2002 renal transplant on Cellcept.  Subsequent stage 5 CKD.  Anemia treated with Aranesp injections by nephrologist Dr. [name].   No records of prior colonoscopy or EGD.  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    10/28 -01/06/19 admission to [facility] for Covid 19 associated pneumonia, respiratory failure, hypoxia, AKI.  Completed course of steroids and remdesivir.  Discharged with no requirements for oxygen.  DC meds included ferrous sulfate 325 mg bid, Ptotonix 40/day, ASA 81/day, Protonix 40/day, weekly Aranesp.  No fup appointments outlined in discharge summary  Since discharge had not been eating or taking his meds, was becoming increasingly weak.  Readmitted to [facility] 11/8 with AMS, hypoxia.  Felt to have ongoing multifocal pneumonia treated with cefepime, Zyvox. Has now been transferred to [facility] in order to initiate trial of hemodialysis in  attempt to improve his mental status.  Received 1st dialysis 11/19, 2nd on 11/20.  BUN and creatinine have improved.  Has had progressive anemia.   Received 1 PRBC 11/13, Hgb 7.1 >> 9.5 >> 10.3.  Hb has begun to  drift down: 9.1 >> 8  >> 7.1 today.  Ferritin elevated up to 5300, now 1500.  Iron is 69.  TIBC, iron sats not calculated. 10 days ago a small amount of blood was noted with stool and FOBT positive 01/22/2019.  Bedside staff has not seen any blood per rectum, melena.  There is been no nausea or vomiting, he is tolerating his tube feeds.  AMS does not allow for p.o. intake.  Patient also has noncritical thrombocytopenia first noted 01/01/2019, now w platelets 117. INR elevated at 1.5 on 01/11/2019  Pt is DNR.    Past Medical History:  Diagnosis Date  . Hypertension   . Renal disorder     Past Surgical History:  Procedure Laterality Date  . NEPHRECTOMY TRANSPLANTED ORGAN      Prior to Admission medications   Medication Sig Start Date End Date Taking? Authorizing Provider  aspirin EC 81 MG tablet Take 81 mg by mouth daily.   Yes [provider]  Darbepoetin Alfa (ARANESP) 300 MCG/0.6ML SOSY injection Inject 300 mcg into the skin every 7 (seven) days.   Yes [provider]  sulfamethoxazole-trimethoprim (BACTRIM,SEPTRA) 400-80 MG per tablet Take 1 tablet by mouth every Monday, Wednesday, and Friday. Take one tablet by mouth on Monday, Wednesday and fridays. 11/21/18  Yes  [provider]  acetaminophen (TYLENOL) 325 MG tablet Take 2 tablets (650 mg total) by mouth every 6 (six) hours as needed for mild pain or headache (fever >/= 101). 01/06/19   [name], MD  amLODipine (NORVASC) 10 MG tablet Take 10 mg by mouth daily. 06/17/14   [provider]  calcitRIOL (ROCALTROL) 0.5 MCG capsule Take 0.5 mcg by mouth daily. 06/17/14   [provider]  cloNIDine (CATAPRES) 0.2 MG tablet Take 1 tablet (0.2 mg total) by mouth 2 (two) times daily. 01/06/19   [name], MD  ferrous sulfate 325 (65 FE) MG EC tablet Take 325 mg by mouth 2 (two) times daily.    [provider]  Ipratropium-Albuterol (COMBIVENT) 20-100 MCG/ACT AERS respimat Inhale 1  puff into the lungs every 6 (six) hours. 01/06/19   [name], MD  labetalol (NORMODYNE) 300 MG tablet Take 1 tablet (300 mg total) by mouth 3 (three) times daily. Patient taking differently: Take 300 mg by mouth 2 (two) times daily.  01/06/19   [name], MD  magnesium oxide (MAG-OX) 400 MG tablet Take 400 mg by mouth 2 (two) times daily.    [provider]  mycophenolate (CELLCEPT) 500 MG tablet Take 500 mg by mouth 2 (two) times daily.    [provider]  ondansetron (ZOFRAN) 4 MG tablet Take 1 tablet (4 mg total) by mouth every 6 (six) hours as needed for nausea. Patient not taking: Reported on 01/11/2019 01/06/19   [name], MD  pantoprazole (PROTONIX) 40 MG tablet Take 40 mg by mouth daily.    [provider]  PREDNISONE PO Take 5 mg by mouth 2 (two) times daily.     [provider]  sildenafil (VIAGRA) 100 MG tablet Take 100 mg by mouth as needed for erectile dysfunction.    [provider]  sodium bicarbonate 650 MG tablet Take 650 mg by mouth 3 (three) times daily.    [provider]  tacrolimus (PROGRAF) 1 MG capsule Take 1 mg by mouth 2 (two) times daily.     [provider]  traZODone (DESYREL) 50 MG tablet Take 0.5 tablets (25 mg total) by mouth at bedtime as needed for sleep. Patient not taking: Reported on 01/11/2019 01/06/19   [name], MD  vitamin C (VITAMIN C) 500 MG tablet Take 1 tablet (500 mg total) by mouth daily. 01/07/19   [name], MD  zinc sulfate 220 (50 Zn) MG capsule Take 1 capsule (220 mg total) by mouth daily. 01/07/19   [name], MD    Scheduled Meds: . amLODipine  10 mg Oral Daily  . aspirin EC  81 mg Oral Daily  . Chlorhexidine Gluconate Cloth  6 each Topical Q0600  . cloNIDine  0.2 mg Oral BID  . darbepoetin (ARANESP) injection - NON-DIALYSIS  150 mcg Subcutaneous Q Wed-1800  . feeding supplement (ENSURE ENLIVE)  237 mL Oral TID BM  . folic acid  1 mg Oral Daily   . free water  200 mL Per Tube Q8H  . insulin aspart  0-9 Units Subcutaneous Q4H  . magnesium oxide  400 mg Oral BID WC  . mouth rinse  15 mL Mouth Rinse BID  . methylPREDNISolone (SOLU-MEDROL) injection  40 mg Intravenous Q12H  . metoprolol tartrate  50 mg Oral BID  . mycophenolate  500 mg Oral BID  . pantoprazole (PROTONIX) IV  40 mg Intravenous Q12H  . sodium  chloride flush  3 mL Intravenous Q12H  . sulfamethoxazole-trimethoprim  1 tablet Oral Q M,W,F  . tacrolimus  4 mg Oral BID  . vancomycin variable dose per unstable renal function (pharmacist dosing)   Does not apply See admin instructions   Infusions: . sodium chloride 10 mL/hr at 01/23/19 1000  . sodium chloride    . sodium chloride    . ceFEPime (MAXIPIME) IV     Followed by  . [START ON 01/24/2019] ceFEPime (MAXIPIME) IV    . feeding supplement (VITAL AF 1.2 CAL) 55 mL/hr at 01/23/19 1000  . potassium chloride 10 mEq (01/23/19 1153)  . vancomycin     PRN Meds: sodium chloride, sodium chloride, sodium chloride, acetaminophen, albuterol, calcium carbonate (dosed in mg elemental calcium), camphor-menthol **AND** hydrOXYzine, docusate sodium, docusate sodium, heparin, hydrALAZINE, labetalol, lidocaine (PF), lidocaine-prilocaine, loperamide, oxyCODONE, pentafluoroprop-tetrafluoroeth, sodium chloride flush, zolpidem   Allergies as of 01/11/2019  . (No Active Allergies)    History reviewed. No pertinent family history.  Social History   Socioeconomic History  . Marital status: Single    Spouse name: Not on file  . Number of children: Not on file  . Years of education: Not on file  . Highest education level: Not on file  Occupational History  . Not on file  Social Needs  . Financial resource strain: Not on file  . Food insecurity    Worry: Not on file    Inability: Not on file  . Transportation needs    Medical: Not on file    Non-medical: Not on file  Tobacco Use  . Smoking status: Never Smoker  . Smokeless  tobacco: Never Used  Substance and Sexual Activity  . Alcohol use: No  . Drug use: No  . Sexual activity: Not on file  Lifestyle  . Physical activity    Days per week: Not on file    Minutes per session: Not on file  . Stress: Not on file  Relationships  . Social Herbalist on phone: Not on file    Gets together: Not on file    Attends religious service: Not on file    Active member of club or organization: Not on file    Attends meetings of clubs or organizations: Not on file    Relationship status: Not on file  . Intimate partner violence    Fear of current or ex partner: Not on file    Emotionally abused: Not on file    Physically abused: Not on file    Forced sexual activity: Not on file  Other Topics Concern  . Not on file  Social History Narrative  . Not on file    REVIEW OF SYSTEMS: UTO due to poor mental status   PHYSICAL EXAM: Vital signs in last 24 hours: Vitals:   01/23/19 1100 01/23/19 1119  BP: (!) 166/66 (!) 166/66  Pulse: 93   Resp: (!) 28   Temp:    SpO2: 93%    Wt Readings from Last 3 Encounters:  01/23/19 48.4 kg  01/01/19 53.5 kg  04/08/15 56.7 kg   PE not performed as patient in isolation for COVID in order to preserve PPE and to avoid exposure to more providers.  Case discussed with CCM nursing.   Intake/Output from previous day: 11/19 0701 - 11/20 0700 In: 2795.8 [I.V.:1860.8; NG/GT:935] Out: 1350 [Urine:1350] Intake/Output this shift: Total I/O In: 1192.1 [I.V.:381.4; NG/GT:550; IV Piggyback:260.7] Out: 150 [Urine:150]  LAB RESULTS: Recent  Labs    01/21/19 0235  01/22/19 0215 01/22/19 1556 01/23/19 0336  WBC 7.7  --  8.2  --  8.6  HGB 8.0*   < > 7.5* 7.7* 7.1*  HCT 22.9*   < > 22.6* 21.5* 21.2*  PLT 119*  --  117*  --  118*   < > = values in this interval not displayed.   BMET Lab Results  Component Value Date   NA 139 01/23/2019   NA 140 01/22/2019   NA 139 01/21/2019   K 2.7 (LL) 01/23/2019   K 3.1 (L)  01/22/2019   K 3.7 01/21/2019   CL 98 01/23/2019   CL 107 01/22/2019   CL 111 01/21/2019   CO2 31 01/23/2019   CO2 18 (L) 01/22/2019   CO2 16 (L) 01/21/2019   GLUCOSE 116 (H) 01/23/2019   GLUCOSE 163 (H) 01/22/2019   GLUCOSE 232 (H) 01/21/2019   BUN 70 (H) 01/23/2019   BUN 136 (H) 01/22/2019   BUN 127 (H) 01/21/2019   CREATININE 3.70 (H) 01/23/2019   CREATININE 5.64 (H) 01/22/2019   CREATININE 5.82 (H) 01/21/2019   CALCIUM 7.0 (L) 01/23/2019   CALCIUM 7.0 (L) 01/22/2019   CALCIUM 7.6 (L) 01/21/2019   LFT Recent Labs    01/21/19 0235 01/22/19 0215 01/23/19 0336  PROT 6.2* 5.9* 5.6*  ALBUMIN 1.8* 1.7* 1.5*  AST 21 49* 37  ALT 19 49* 41  ALKPHOS 71 87 84  BILITOT 1.0 0.6 0.8   PT/INR Lab Results  Component Value Date   INR 1.5 (H) 01/11/2019   Hepatitis Panel Recent Labs    01/22/19 1245  HEPBSAG NON REACTIVE  HEPBIGM NON REACTIVE   C-Diff No components found for: CDIFF Lipase  No results found for: LIPASE  Drugs of Abuse  No results found for: LABOPIA, COCAINSCRNUR, LABBENZ, AMPHETMU, THCU, LABBARB   RADIOLOGY STUDIES: Dg Chest Port 1 View  Result Date: 01/23/2019 CLINICAL DATA:  Shortness of breath.  COVID-19 positive EXAM: PORTABLE CHEST 1 VIEW COMPARISON:  Yesterday FINDINGS: Extensive bilateral airspace disease that has worsened. Feeding tube which at least reaches the stomach. Cardiomegaly. No visible effusion or pneumothorax. IMPRESSION: Worsening bilateral pneumonia. Electronically Signed   By: Monte Fantasia M.D.   On: 01/23/2019 09:38   Dg Chest Port 1 View  Result Date: 01/22/2019 CLINICAL DATA:  73 year old male with hypoxia. Positive COVID-19. EXAM: PORTABLE CHEST 1 VIEW COMPARISON:  Chest radiograph dated 01/17/2019. FINDINGS: A feeding tube is noted with tip beyond the inferior margin of the image. Bilateral confluent airspace opacities with overall slight improvement since the prior radiograph. Probable small left pleural effusion. No  pneumothorax. Stable cardiac silhouette with atherosclerotic calcification of the aorta. No acute osseous pathology. IMPRESSION: 1. Slight interval improvement in the bilateral confluent airspace opacities compared to the prior radiograph. 2. Probable small left pleural effusion. Electronically Signed   By: Anner Crete M.D.   On: 01/22/2019 09:03     IMPRESSION:   Hematochezia, single recent episode reportedly small-volume, none since. Multifactorial anemia, underlying critical illness, acute on chronic kidney disease, with seemingly a much lesser opponent of acute GI blood loss from this episode described above. Recent Covid infection, ongoing critical illness as a result    PLAN:     This patient remains critically ill, now having difficulty tolerating dialysis, is unresponsive except to painful stimuli, being fed with a cortrack tube, and not clearly making any clinical gains since admission.  He is a DNR. The  nature of this recent episode of hematochezia is unknown, but I absolutely would not pursue a colonoscopy in this patient.  His stool tested heme positive, the significance of which is unclear and a complex critically ill patient like this. Palliative care note from today the case that if there are no improvements in the next 12 to 24 hours that it would be inappropriate to transition him to comfort care.  As such, we will sign off.  Thank you for involving [place] in his care.

## 2019-01-23 NOTE — Progress Notes (Signed)
PT Cancellation Note  Patient Details Name: Francisco Williamson MRN: [id number] DOB: 1945-04-17   Cancelled Treatment:    Reason Eval/Treat Not Completed: Fatigue/lethargy limiting ability to participate.  Pt too lethargic to participate per OT.  PT to check back Monday 01/26/19.  Thanks,  Verdene Lennert, PT, DPT  Acute Rehabilitation [phone] pager [phone] office  @ [facility]: [phone]     Harvie Heck 01/23/2019, 4:48 PM

## 2019-01-23 NOTE — Progress Notes (Signed)
CRITICAL VALUE ALERT  Critical Value:  K 2.7  Date & Time Notied:  01/23/2019 @ 0609  Provider Notified: Kennon Holter, APP  Orders Received/Actions taken: awaiting orders

## 2019-01-24 ENCOUNTER — Inpatient Hospital Stay (HOSPITAL_COMMUNITY): Payer: Medicare Other

## 2019-01-24 LAB — CBC WITH DIFFERENTIAL/PLATELET
Abs Immature Granulocytes: 0.12 10*3/uL — ABNORMAL HIGH (ref 0.00–0.07)
Basophils Absolute: 0 10*3/uL (ref 0.0–0.1)
Basophils Relative: 0 %
Eosinophils Absolute: 0 10*3/uL (ref 0.0–0.5)
Eosinophils Relative: 0 %
HCT: 23.1 % — ABNORMAL LOW (ref 39.0–52.0)
Hemoglobin: 7.9 g/dL — ABNORMAL LOW (ref 13.0–17.0)
Immature Granulocytes: 1 %
Lymphocytes Relative: 4 %
Lymphs Abs: 0.3 10*3/uL — ABNORMAL LOW (ref 0.7–4.0)
MCH: 28.4 pg (ref 26.0–34.0)
MCHC: 34.2 g/dL (ref 30.0–36.0)
MCV: 83.1 fL (ref 80.0–100.0)
Monocytes Absolute: 0.3 10*3/uL (ref 0.1–1.0)
Monocytes Relative: 3 %
Neutro Abs: 7.6 10*3/uL (ref 1.7–7.7)
Neutrophils Relative %: 92 %
Platelets: 146 10*3/uL — ABNORMAL LOW (ref 150–400)
RBC: 2.78 MIL/uL — ABNORMAL LOW (ref 4.22–5.81)
RDW: 15.9 % — ABNORMAL HIGH (ref 11.5–15.5)
WBC: 8.3 10*3/uL (ref 4.0–10.5)
nRBC: 0.2 % (ref 0.0–0.2)

## 2019-01-24 LAB — COMPREHENSIVE METABOLIC PANEL
ALT: 31 U/L (ref 0–44)
AST: 26 U/L (ref 15–41)
Albumin: 1.4 g/dL — ABNORMAL LOW (ref 3.5–5.0)
Alkaline Phosphatase: 74 U/L (ref 38–126)
Anion gap: 8 (ref 5–15)
BUN: 65 mg/dL — ABNORMAL HIGH (ref 8–23)
CO2: 29 mmol/L (ref 22–32)
Calcium: 7.4 mg/dL — ABNORMAL LOW (ref 8.9–10.3)
Chloride: 100 mmol/L (ref 98–111)
Creatinine, Ser: 3.38 mg/dL — ABNORMAL HIGH (ref 0.61–1.24)
GFR calc Af Amer: 20 mL/min — ABNORMAL LOW (ref >60)
GFR calc non Af Amer: 17 mL/min — ABNORMAL LOW (ref >60)
Glucose, Bld: 117 mg/dL — ABNORMAL HIGH (ref 70–99)
Potassium: 4.1 mmol/L (ref 3.5–5.1)
Sodium: 137 mmol/L (ref 135–145)
Total Bilirubin: 1.2 mg/dL (ref 0.3–1.2)
Total Protein: 5.5 g/dL — ABNORMAL LOW (ref 6.5–8.1)

## 2019-01-24 LAB — BPAM RBC
Blood Product Expiration Date: 202012262359
ISSUE DATE / TIME: 202011201304
Unit Type and Rh: 5100

## 2019-01-24 LAB — FERRITIN: Ferritin: 1441 ng/mL — ABNORMAL HIGH (ref 24–336)

## 2019-01-24 LAB — GLUCOSE, CAPILLARY
Glucose-Capillary: 124 mg/dL — ABNORMAL HIGH (ref 70–99)
Glucose-Capillary: 150 mg/dL — ABNORMAL HIGH (ref 70–99)
Glucose-Capillary: 180 mg/dL — ABNORMAL HIGH (ref 70–99)
Glucose-Capillary: 185 mg/dL — ABNORMAL HIGH (ref 70–99)
Glucose-Capillary: 56 mg/dL — ABNORMAL LOW (ref 70–99)
Glucose-Capillary: 96 mg/dL (ref 70–99)

## 2019-01-24 LAB — TYPE AND SCREEN
ABO/RH(D): O POS
Antibody Screen: NEGATIVE
Unit division: 0

## 2019-01-24 LAB — C-REACTIVE PROTEIN: CRP: 12.8 mg/dL — ABNORMAL HIGH (ref <1.0)

## 2019-01-24 LAB — TACROLIMUS LEVEL: Tacrolimus (FK506) - LabCorp: 11.7 ng/mL (ref 2.0–20.0)

## 2019-01-24 LAB — D-DIMER, QUANTITATIVE: D-Dimer, Quant: 3.14 ug/mL-FEU — ABNORMAL HIGH (ref 0.00–0.50)

## 2019-01-24 LAB — MAGNESIUM: Magnesium: 1.7 mg/dL (ref 1.7–2.4)

## 2019-01-24 LAB — HEMOGLOBIN AND HEMATOCRIT, BLOOD
HCT: 24.7 % — ABNORMAL LOW (ref 39.0–52.0)
Hemoglobin: 8.4 g/dL — ABNORMAL LOW (ref 13.0–17.0)

## 2019-01-24 LAB — PROCALCITONIN: Procalcitonin: 1.88 ng/mL

## 2019-01-24 MED ORDER — DEXTROSE 50 % IV SOLN
INTRAVENOUS | Status: AC
  Administered 2019-01-24: 12.5 g via INTRAVENOUS
  Filled 2019-01-24: qty 50

## 2019-01-24 MED ORDER — PANTOPRAZOLE SODIUM 40 MG PO TBEC
40.0000 mg | DELAYED_RELEASE_TABLET | Freq: Two times a day (BID) | ORAL | Status: DC
  Administered 2019-01-24 – 2019-02-06 (×26): 40 mg via ORAL
  Filled 2019-01-24 (×26): qty 1

## 2019-01-24 MED ORDER — DEXTROSE 50 % IV SOLN
12.5000 g | INTRAVENOUS | Status: AC
  Administered 2019-01-24: 03:00:00 12.5 g via INTRAVENOUS

## 2019-01-24 NOTE — Progress Notes (Signed)
Subjective:  His second HD treatment yesterday was cut short as it was felt that patient was acutely declining-  Needed abs immediately -  Overall theme was that pt seemed to be actively dying  Objective Vital signs in last 24 hours: Vitals:   01/24/19 0100 01/24/19 0200 01/24/19 0300 01/24/19 0320  BP: (!) 148/68 134/62 (!) 159/68   Pulse: 68 66 75   Resp: (!) 22 (!) 22 (!) 26   Temp:      TempSrc:      SpO2: 100% 100% 99%   Weight:    54 kg  Height:       Weight change: 0.5 kg  Intake/Output Summary (Last 24 hours) at 01/24/2019 [id number] Last data filed at 01/23/2019 2345 Gross per 24 hour  Intake 2412.15 ml  Output 450 ml  Net 1962.15 ml    Assessment/ Plan: Pt is a 73 y.o. yo male s/p renal transplant but with CKD at baseline crt in the 3's who was admitted on 01/11/2019 with weakness after COVID treatment  Assessment/Plan: 1. Renal-  History of CKD and renal transplant.  A on chronic change due to illness and volume/free water depletion- now his free water deficit has been corrected but BUN was still high at 136.    since not thriving I had spoken with family and the patient- we wanted to attempt a trial of HD to see if can clear MS more and improve outlook- for first short treatment 11/19-  Second short on 11/20.  This has brought his BUN down to 65-  Is making urine.  No plans for dialysis today 2. S/p renal transplant-  Is on prograf and very low dose cellcept as well as decadron   3. Anemia- is on procrit normally for anemia as OP-  Supportive therapy - giving ESA and given one unit of blood yesterday as well 4.  Metabolic acidosis-  Was giving bicarb repletion - will stop now as is corrected with HD  5. HTN/volume-  Hypertensive.  continue with antihypertensivies 6.   Hypernatremia- corrected - backed off on free water repletion  7.  Hypokalemia-  Resolved 8.  Dispo-  Palliative care states if no improvement transition to comfort care.  Not sure of the definition of no  improvement.  I will not do anything active today, continue to follow him and wait for direction per primary and palliative.  His renal function and electrolytes as they are today should not be holding him back as these numbers are kind of his usual     Francisco Williamson    Labs: Basic Metabolic Panel: Recent Labs  Lab 01/21/19 0235 01/22/19 0215 01/23/19 0336 01/23/19 1353 01/24/19 0517  NA 139 140 139 138 137  K 3.7 3.1* 2.7* 3.2* 4.1  CL 111 107 98  --  100  CO2 16* 18* 31  --  29  GLUCOSE 232* 163* 116*  --  117*  BUN 127* 136* 70*  --  65*  CREATININE 5.82* 5.64* 3.70*  --  3.38*  CALCIUM 7.6* 7.0* 7.0*  --  7.4*  PHOS 5.6* 5.1* 2.9  --   --    Liver Function Tests: Recent Labs  Lab 01/21/19 0235 01/22/19 0215 01/23/19 0336  AST 21 49* 37  ALT 19 49* 41  ALKPHOS 71 87 84  BILITOT 1.0 0.6 0.8  PROT 6.2* 5.9* 5.6*  ALBUMIN 1.8* 1.7* 1.5*   No results for input(s): LIPASE, AMYLASE in the last 168 hours.  No results for input(s): AMMONIA in the last 168 hours. CBC: Recent Labs  Lab 01/20/19 0442 01/21/19 0235  01/22/19 0215  01/23/19 0336  01/23/19 1353 01/23/19 2108 01/24/19 0517  WBC 7.4 7.7  --  8.2  --  8.6  --   --   --  8.3  NEUTROABS 6.4 6.7  --  6.6  --  6.0  --   --   --  7.6  HGB 9.1* 8.0*   < > 7.5*   < > 7.1*   < > 7.5* 8.4* 7.9*  HCT 27.6* 22.9*   < > 22.6*   < > 21.2*   < > 22.0* 24.6* 23.1*  MCV 85.7 83.0  --  85.9  --  83.8  --   --   --  83.1  PLT 137* 119*  --  117*  --  118*  --   --   --  146*   < > = values in this interval not displayed.   Cardiac Enzymes: No results for input(s): CKTOTAL, CKMB, CKMBINDEX, TROPONINI in the last 168 hours. CBG: Recent Labs  Lab 01/23/19 1637 01/23/19 1947 01/24/19 0010 01/24/19 0248 01/24/19 0317  GLUCAP 238* 232* 96 56* 124*    Iron Studies:  Recent Labs    01/22/19 1631 01/23/19 0336  IRON 69  --   TIBC NOT CALCULATED  --   FERRITIN  --  1,567*   Studies/Results: Dg Chest Port  1 View  Result Date: 01/23/2019 CLINICAL DATA:  Shortness of breath.  COVID-19 positive EXAM: PORTABLE CHEST 1 VIEW COMPARISON:  Yesterday FINDINGS: Extensive bilateral airspace disease that has worsened. Feeding tube which at least reaches the stomach. Cardiomegaly. No visible effusion or pneumothorax. IMPRESSION: Worsening bilateral pneumonia. Electronically Signed   By: Monte Fantasia M.D.   On: 01/23/2019 09:38   Dg Chest Port 1 View  Result Date: 01/22/2019 CLINICAL DATA:  73 year old male with hypoxia. Positive COVID-19. EXAM: PORTABLE CHEST 1 VIEW COMPARISON:  Chest radiograph dated 01/17/2019. FINDINGS: A feeding tube is noted with tip beyond the inferior margin of the image. Bilateral confluent airspace opacities with overall slight improvement since the prior radiograph. Probable small left pleural effusion. No pneumothorax. Stable cardiac silhouette with atherosclerotic calcification of the aorta. No acute osseous pathology. IMPRESSION: 1. Slight interval improvement in the bilateral confluent airspace opacities compared to the prior radiograph. 2. Probable small left pleural effusion. Electronically Signed   By: Anner Crete M.D.   On: 01/22/2019 09:03   Medications: Infusions: . sodium chloride 10 mL/hr at 01/23/19 1900  . sodium chloride    . sodium chloride    . ceFEPime (MAXIPIME) IV    . feeding supplement (VITAL AF 1.2 CAL) 55 mL/hr at 01/23/19 1900    Scheduled Medications: . sodium chloride   Intravenous Once  . amLODipine  10 mg Oral Daily  . aspirin EC  81 mg Oral Daily  . Chlorhexidine Gluconate Cloth  6 each Topical Q0600  . cloNIDine  0.2 mg Oral BID  . darbepoetin (ARANESP) injection - NON-DIALYSIS  150 mcg Subcutaneous Q Wed-1800  . feeding supplement (ENSURE ENLIVE)  237 mL Oral TID BM  . folic acid  1 mg Oral Daily  . free water  200 mL Per Tube Q8H  . insulin aspart  0-9 Units Subcutaneous Q4H  . magnesium oxide  400 mg Oral BID WC  . mouth rinse  15  mL Mouth Rinse BID  . methylPREDNISolone (SOLU-MEDROL) injection  40 mg Intravenous Q12H  . metoprolol tartrate  50 mg Oral BID  . mupirocin ointment   Nasal BID  . mycophenolate  500 mg Oral BID  . pantoprazole (PROTONIX) IV  40 mg Intravenous Q12H  . sodium chloride flush  3 mL Intravenous Q12H  . sulfamethoxazole-trimethoprim  1 tablet Oral Q M,W,F  . tacrolimus  4 mg Oral BID  . vancomycin variable dose per unstable renal function (pharmacist dosing)   Does not apply See admin instructions    have reviewed scheduled and prn medications.  Physical Exam: PE not performed as patient in isolation for COVD in order to preserve PPE and to avoid exposure to more providers.  Case discussed with CCM  01/24/2019,6:28 AM  LOS: 13 days

## 2019-01-24 NOTE — Progress Notes (Addendum)
PROGRESS NOTE    Francisco Williamson  [id number] DOB: October 06, 1945 DOA: 01/11/2019 PCP: Default, Provider, MD   Brief Narrative:  73 y.o.BM PMHx Essential HTN and CKD stage V (s/p renal transplantation), deconditioning,  Presenting with AMS. He was admitted to [facility] from 10/28-11/3 for COVID-19-associated PNA with acute respiratory failure with hypoxia. He completed Ketih Goodie course of Remdesivir and steroids, and he was discharged home on 11/3, patient was brought back to ED, given failure to thrive, dehydration and altered mental status, he was noted to have significant dehydration with hypernatremia and renal failure, he was transferred to [facility] for further management.  Transferred to [facility] on 11/17 due to possible need for HD.Marland Kitchen  Assessment & Plan:   Principal Problem:   Acute renal failure superimposed on chronic kidney disease ([place]) Active Problems:   CKD stage 5 secondary to hypertension ([place])   Pneumonia due to COVID-19 virus   Essential hypertension   History of renal transplant   Severe sepsis (HCC)   Sepsis secondary to UTI ([place])   HCAP (healthcare-associated pneumonia)   Bilateral pleural effusion   Acute hypernatremia   Goals of care, counseling/discussion   Palliative care by specialist   Protein-calorie malnutrition, severe  Goals of care: improved today, will continue treatment below and continue to engage with pt and family.  Previously palliative discussed rehab if he improves, home with hospice if not.  He's still focused on getting home, but discussed risks of leaving hospital at this time before he's well.  Recurrent Sepsis 2/2 Suspected HCAP?  HCAP/Covid 19 pneumonia  Acute Hypoxic Respiratory Failure - 11/20 with worsening resp status, lethargy - improved today 11/21 after abx and steroids started  - on RA today while I was at bedside, satting ~89% - continue to wean O2 as tolerated - Worsening CXR on 11/20 concerning for HCAP vs related to COVID 19 infection, ?  Aspiration with AMS - Blood cx from 11/20 pending, follow UA/cx - procalcitonin 2.24, downtrending 1.88 11/21 - He completed remdesivir and steroids 10/28-11/3 - Repeat course of remdesivir and steroids 11/14 - 11/18.  Convalescent plasma 11/14. - Cefepime for HCAP 11/8 -11/14.  Vancomycin 11/9 and 11/11. - Restarted on vanc/cefepime for HCAP on 11/20 with steroids added - last fever 11/20 - CT chest when able  COVID-19 Labs  Recent Labs    01/22/19 0215 01/23/19 0336 01/24/19 0517  DDIMER 2.46* 3.18* 3.14*  FERRITIN 1,967* 1,567* 1,441*  CRP 8.9* 9.7* 12.8*    Lab Results  Component Value Date   SARSCOV2NAA POSITIVE (Smt. Loder) 12/31/2018   Acute on CKD stage V/RENAL transplant patient (Cr = 3.5)  NAGMA  Hypernatremia - creatinine worsened since admission (baseline appears in 3-4 range - most recently ~3.58 on 11/3) - he received lasix/albumin earlier in course of hospitalization - creatinine 3.38 today - Appreciate renal assistance - hold dialysis today (dialysis cut short yesterday due to concern for need for abx)  - AKI on CKD thought related to illness and volume/free water depletion.  -I/O, daily weights - Continue prograf and cellcept as well as bactrim ppx - bicarb and free water per renal  Sepsis due to staph epidermidis UTI -Afebrile, negative leukocytosis.  With his renal function, the doses of vanc he received should have covered him  Bilateral pleural effusion/Hypoalbuminemia -See acute on CKD stage V  Essential HTN -Amlodipine 10 mg daily -Clonidine 0.2 mg BID -Hydralazine PRN -Labetalol PRN -11/13 increase Metoprolol 50 mg BID  GI bleed -11/10 PN notes small amount  of blood with stool - Occult blood pending collection - this was positive - DVT ppx was held, but now restarted on 11/17 - hold DVT ppx  - Continue to follow H/H today, transfuse for <7 - hemoccult positive - GI not planning for colonoscopy - continue to monitor at this time -11/13  transfuse 1 unit PRBC -Transfuse for hemoglobin<7   Hypernatremia - improved, appreciate renal assistance -See renal failure  Hypokalemia -See renal failure - K per renal  Hypomagnesmia - PO mag, follow  Hyperglycemia  Hypoglycemia - resume tube feeds -Sensitive SSI - A1c is 4.2  Diarrhea -Patient unaware of the BMs -Would also account for his hypokalemia. -Imodium -11/16 discontinue Sorbitol, bisacodyl, MiraLAX  Failure to thrive -Patient unable to care for himself at home.  Discharged on 11/3 and readmitted on 11/8.  Patient now with worsening acute on CKD stage V.  Clear that if patient goes home instead of to [facility] is at high risk for complete renal failure of his transplanted kidney. -11/16 requested placement of CorTrak tube - Repeat SLP eval today  DVT prophylaxis: SCD Code Status: DNR Family Communication: none at bedside - niece 11/21 Disposition Plan: pending    Consultants:   Renal   Procedures:   Cortrak placement on 11/17 Echo IMPRESSIONS    1. Left ventricular ejection fraction, by visual estimation, is 60 to 65%. The left ventricle has normal function. Left ventricular septal wall thickness was mildly increased. Mildly increased left ventricular posterior wall thickness. There is mildly  increased left ventricular hypertrophy.  2. Left ventricular diastolic parameters are consistent with Grade I diastolic dysfunction (impaired relaxation).  3. Global right ventricle has normal systolic function.The right ventricular size is normal. No increase in right ventricular wall thickness.  4. Left atrial size was severely dilated.  5. Right atrial size was mildly dilated.  6. The mitral valve is normal in structure. No evidence of mitral valve regurgitation. No evidence of mitral stenosis.  7. The tricuspid valve is normal in structure. Tricuspid valve regurgitation is mild.  8. The aortic valve is grossly normal. Aortic valve regurgitation is not  visualized. No evidence of aortic valve sclerosis or stenosis.  9. The pulmonic valve was normal in structure. Pulmonic valve regurgitation is not visualized. 10. Mildly elevated pulmonary artery systolic pressure. 11. The inferior vena cava is normal in size with greater than 50% respiratory variability, suggesting right atrial pressure of 3 mmHg.  Antimicrobials:  Anti-infectives (From admission, onward)   Start     Dose/Rate Route Frequency Ordered Stop   01/24/19 1800  ceFEPIme (MAXIPIME) 1 g in sodium chloride 0.9 % 100 mL IVPB     1 g 200 mL/hr over 30 Minutes Intravenous Every 24 hours 01/23/19 1122     01/23/19 1200  ceFEPIme (MAXIPIME) 1 g in sodium chloride 0.9 % 100 mL IVPB     1 g 200 mL/hr over 30 Minutes Intravenous  Once 01/23/19 1122 01/23/19 1452   01/23/19 1130  vancomycin (VANCOCIN) IVPB 1000 mg/200 mL premix     1,000 mg 200 mL/hr over 60 Minutes Intravenous  Once 01/23/19 1122 01/23/19 1623   01/23/19 1123  vancomycin variable dose per unstable renal function (pharmacist dosing)      Does not apply See admin instructions 01/23/19 1123     01/17/19 1300  remdesivir 100 mg in sodium chloride 0.9 % 250 mL IVPB     100 mg 500 mL/hr over 30 Minutes Intravenous Daily 01/17/19 1144 01/21/19  1106   01/16/19 1800  vancomycin (VANCOCIN) 500 mg in sodium chloride 0.9 % 100 mL IVPB     500 mg 100 mL/hr over 60 Minutes Intravenous  Once 01/13/19 1758 01/16/19 2323   01/13/19 1900  vancomycin (VANCOCIN) IVPB 1000 mg/200 mL premix     1,000 mg 200 mL/hr over 60 Minutes Intravenous  Once 01/13/19 1758 01/13/19 2200   01/12/19 1800  ceFEPIme (MAXIPIME) 1 g in sodium chloride 0.9 % 100 mL IVPB     1 g 200 mL/hr over 30 Minutes Intravenous Every 24 hours 01/11/19 0910 01/17/19 1904   01/12/19 1600  sulfamethoxazole-trimethoprim (BACTRIM) 400-80 MG per tablet 1 tablet    Note to Pharmacy: Take one tablet by mouth on Monday, Wednesday and fridays.     1 tablet Oral Every M-W-F  01/12/19 1449     01/11/19 0830  vancomycin (VANCOCIN) IVPB 1000 mg/200 mL premix  Status:  Discontinued     1,000 mg 200 mL/hr over 60 Minutes Intravenous  Once 01/11/19 0825 01/11/19 0901   01/11/19 0830  ceFEPIme (MAXIPIME) 2 g in sodium chloride 0.9 % 100 mL IVPB     2 g 200 mL/hr over 30 Minutes Intravenous  Once 01/11/19 0825 01/11/19 0952     Subjective: askign about going home again Sherrill Buikema&Ox3  Objective: Vitals:   01/24/19 0500 01/24/19 0600 01/24/19 0700 01/24/19 0800  BP: (!) 155/72 (!) 153/61 (!) 158/72 (!) 181/76  Pulse: 75 80 85 95  Resp: (!) 24 (!) 28 (!) 29 (!) 31  Temp:      TempSrc:      SpO2: 99% 99% 99% 93%  Weight:      Height:        Intake/Output Summary (Last 24 hours) at 01/24/2019 0828 Last data filed at 01/24/2019 0600 Gross per 24 hour  Intake 2103.75 ml  Output 425 ml  Net 1678.75 ml   Filed Weights   01/23/19 1245 01/23/19 1415 01/24/19 0320  Weight: 49 kg 50 kg 54 kg    Examination:  General: No acute distress. Cardiovascular: RRR Lungs: unlabored Abdomen: Soft, nontender, nondistended. Neurological: Alert and oriented 3. Moves all extremities 4 . Cranial nerves II through XII grossly intact. Skin: Warm and dry. No rashes or lesions. Extremities: No clubbing or cyanosis. No edema.    Data Reviewed: I have personally reviewed following labs and imaging studies  CBC: Recent Labs  Lab 01/20/19 0442 01/21/19 0235  01/22/19 0215  01/23/19 0336 01/23/19 1113 01/23/19 1353 01/23/19 2108 01/24/19 0517  WBC 7.4 7.7  --  8.2  --  8.6  --   --   --  8.3  NEUTROABS 6.4 6.7  --  6.6  --  6.0  --   --   --  7.6  HGB 9.1* 8.0*   < > 7.5*   < > 7.1* 6.8* 7.5* 8.4* 7.9*  HCT 27.6* 22.9*   < > 22.6*   < > 21.2* 19.5* 22.0* 24.6* 23.1*  MCV 85.7 83.0  --  85.9  --  83.8  --   --   --  83.1  PLT 137* 119*  --  117*  --  118*  --   --   --  146*   < > = values in this interval not displayed.   Basic Metabolic Panel: Recent Labs  Lab  01/19/19 0450 01/20/19 0442 01/21/19 0235 01/22/19 0215 01/23/19 0336 01/23/19 1353 01/24/19 0517  NA 153* 150* 139 140 139  138 137  K 4.2 3.8 3.7 3.1* 2.7* 3.2* 4.1  CL 120* 116* 111 107 98  --  100  CO2 21* 19* 16* 18* 31  --  29  GLUCOSE 206* 164* 232* 163* 116*  --  117*  BUN 142* 122* 127* 136* 70*  --  65*  CREATININE 5.78* 5.66* 5.82* 5.64* 3.70*  --  3.38*  CALCIUM 8.9 8.5* 7.6* 7.0* 7.0*  --  7.4*  MG 2.2 1.9 1.7 1.6* 1.6*  --  1.7  PHOS 5.4* 5.3* 5.6* 5.1* 2.9  --   --    GFR: Estimated Creatinine Clearance: 15.3 mL/min (Marlene Pfluger) (by C-G formula based on SCr of 3.38 mg/dL (H)). Liver Function Tests: Recent Labs  Lab 01/20/19 0442 01/21/19 0235 01/22/19 0215 01/23/19 0336 01/24/19 0517  AST 22 21 49* 37 26  ALT 24 19 49* 41 31  ALKPHOS 81 71 87 84 74  BILITOT 0.8 1.0 0.6 0.8 1.2  PROT 7.4 6.2* 5.9* 5.6* 5.5*  ALBUMIN 2.3* 1.8* 1.7* 1.5* 1.4*   No results for input(s): LIPASE, AMYLASE in the last 168 hours. No results for input(s): AMMONIA in the last 168 hours. Coagulation Profile: No results for input(s): INR, PROTIME in the last 168 hours. Cardiac Enzymes: No results for input(s): CKTOTAL, CKMB, CKMBINDEX, TROPONINI in the last 168 hours. BNP (last 3 results) No results for input(s): PROBNP in the last 8760 hours. HbA1C: No results for input(s): HGBA1C in the last 72 hours. CBG: Recent Labs  Lab 01/23/19 1637 01/23/19 1947 01/24/19 0010 01/24/19 0248 01/24/19 0317  GLUCAP 238* 232* 96 56* 124*   Lipid Profile: No results for input(s): CHOL, HDL, LDLCALC, TRIG, CHOLHDL, LDLDIRECT in the last 72 hours. Thyroid Function Tests: No results for input(s): TSH, T4TOTAL, FREET4, T3FREE, THYROIDAB in the last 72 hours. Anemia Panel: Recent Labs    01/22/19 1631 01/23/19 0336 01/24/19 0517  VITAMINB12 381  --   --   FOLATE 4.0*  --   --   FERRITIN  --  1,567* 1,441*  TIBC NOT CALCULATED  --   --   IRON 69  --   --    Sepsis Labs: Recent Labs  Lab  01/18/19 0215 01/23/19 1113 01/23/19 1322 01/24/19 0517  PROCALCITON 21.96 2.24  --  1.88  LATICACIDVEN  --  1.0 0.9  --     Recent Results (from the past 240 hour(s))  Culture, blood (routine x 2)     Status: None   Collection Time: 01/16/19  7:45 AM   Specimen: BLOOD  Result Value Ref Range Status   Specimen Description   Final    BLOOD RIGHT ANTECUBITAL Performed at [facility], [place] [address] [place], [place] [address]    Special Requests   Final    BOTTLES DRAWN AEROBIC ONLY Blood Culture adequate volume Performed at [place] [address] [place], [place] [address]    Culture   Final    NO GROWTH 5 DAYS Performed at [facility], [place] [address] [place], [place] [address]    Report Status 01/21/2019 FINAL  Final  Culture, blood (routine x 2)     Status: None   Collection Time: 01/16/19  7:50 AM   Specimen: BLOOD  Result Value Ref Range Status   Specimen Description   Final    BLOOD RIGHT HAND Performed at [facility] [address], [place], [place] [address]    Special Requests  Final    BOTTLES DRAWN AEROBIC ONLY Blood Culture adequate volume Performed at [facility] [address], [place], [place] [address]    Culture   Final    NO GROWTH 5 DAYS Performed at [facility], [place] [address], [place], [place] [address]    Report Status 01/21/2019 FINAL  Final  MRSA PCR Screening     Status: Abnormal   Collection Time: 01/23/19 11:18 AM   Specimen: Nasal Mucosa; Nasopharyngeal  Result Value Ref Range Status   MRSA by PCR POSITIVE ([place]) NEGATIVE Final    Comment:        The GeneXpert MRSA Assay (FDA approved for NASAL specimens only), is one component of [facility] comprehensive MRSA colonization surveillance program. It is not intended to diagnose MRSA infection nor to guide or monitor treatment for MRSA infections. RESULT CALLED TO, READ BACK BY AND VERIFIED WITH:  Dennison Bulla RN 15:20 01/23/19 ([email or web address]) Performed at [facility], [place] [address], [place], [place] [address]          Radiology Studies: Dg Chest Port 1 View  Result Date: 01/23/2019 CLINICAL DATA:  Shortness of breath.  COVID-19 positive EXAM: PORTABLE CHEST 1 VIEW COMPARISON:  Yesterday FINDINGS: Extensive bilateral airspace disease that has worsened. Feeding tube which at least reaches the stomach. Cardiomegaly. No visible effusion or pneumothorax. IMPRESSION: Worsening bilateral pneumonia. Electronically Signed   By: Monte Fantasia M.D.   On: 01/23/2019 09:38        Scheduled Meds: . sodium chloride   Intravenous Once  . amLODipine  10 mg Oral Daily  . aspirin EC  81 mg Oral Daily  . Chlorhexidine Gluconate Cloth  6 each Topical Q0600  . cloNIDine  0.2 mg Oral BID  . darbepoetin (ARANESP) injection - NON-DIALYSIS  150 mcg Subcutaneous Q Wed-1800  . feeding supplement (ENSURE ENLIVE)  237 mL Oral TID BM  . folic acid  1 mg Oral Daily  . free water  200 mL Per Tube Q8H  . magnesium oxide  400 mg Oral BID WC  . mouth rinse  15 mL Mouth Rinse BID  . methylPREDNISolone (SOLU-MEDROL) injection  40 mg Intravenous Q12H  . metoprolol tartrate  50 mg Oral BID  . mupirocin ointment   Nasal BID  . mycophenolate  500 mg Oral BID  . pantoprazole (PROTONIX) IV  40 mg Intravenous Q12H  . sodium chloride flush  3 mL Intravenous Q12H  . sulfamethoxazole-trimethoprim  1 tablet Oral Q M,W,F  . tacrolimus  4 mg Oral BID  . vancomycin variable dose per unstable renal function (pharmacist dosing)   Does not apply See admin instructions   Continuous Infusions: . sodium chloride 10 mL/hr at 01/23/19 1900  . sodium chloride    . sodium chloride    . ceFEPime (MAXIPIME) IV    . feeding supplement (VITAL AF 1.2 CAL) 55 mL/hr at 01/23/19 1900     LOS: 13 days    Time spent: over 62 min    Fayrene Helper, MD [facility] Pager AMION  If 7PM-7AM, please contact  night-coverage [email or web address] Password [facility] 01/24/2019, 8:28 AM

## 2019-01-24 NOTE — Progress Notes (Addendum)
  Speech Language Pathology Treatment: Dysphagia  Patient Details Name: Francisco Williamson MRN: [id number] DOB: 1946-01-18 Today's Date: 01/24/2019 Time: [id number] SLP Time Calculation (min) (ACUTE ONLY): 16 min  Assessment / Plan / Recommendation Clinical Impression  Pt demonstrates much improved awareness with oral intake today. He followed one step commands, sometimes with repetition needed, was able to to take several trials of consecutive straw sips with automatic appearing swallow response. No cueing needed. He masticated solids and consumed puree easily. Apparently his arousal waxes and wanes. Recommend pt resume a puree diet and thin liquids, to be offered when alert and accepting PO.   HPI HPI: 73 yo male presenting with AMS and failure to thrive, renal failure.Pt was admitted to [facility] from 10/28-11/3 for COVID + and PNA. 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  Clinical swallow evaluation was completed on 11/19 - pt was communicative and swallow appeared to be [facility]; regular diet was recommended.  11/20 CXR with worsening bilateral pna; continued 02 requirement, fever and AMS. Swallow evaluation reordered.        SLP Plan  Continue with current plan of care       Recommendations  Diet recommendations: Thin liquid;Dysphagia 1 (puree) Liquids provided via: Cup;Straw Medication Administration: Via alternative means Supervision: Staff to assist with self feeding Compensations: Slow rate;Small sips/bites Postural Changes and/or Swallow Maneuvers: Seated upright 90 degrees                Oral Care Recommendations: Oral care QID SLP Visit Diagnosis: Dysphagia, unspecified (R13.10) Plan: Continue with current plan of care       GO               Herbie [place], [place] Pager [phone] Office [phone]   Lynann Beaver 01/24/2019, 3:13 PM

## 2019-01-25 LAB — COMPREHENSIVE METABOLIC PANEL
ALT: 33 U/L (ref 0–44)
AST: 24 U/L (ref 15–41)
Albumin: 1.5 g/dL — ABNORMAL LOW (ref 3.5–5.0)
Alkaline Phosphatase: 86 U/L (ref 38–126)
Anion gap: 10 (ref 5–15)
BUN: 93 mg/dL — ABNORMAL HIGH (ref 8–23)
CO2: 28 mmol/L (ref 22–32)
Calcium: 7.8 mg/dL — ABNORMAL LOW (ref 8.9–10.3)
Chloride: 100 mmol/L (ref 98–111)
Creatinine, Ser: 4.39 mg/dL — ABNORMAL HIGH (ref 0.61–1.24)
GFR calc Af Amer: 15 mL/min — ABNORMAL LOW (ref >60)
GFR calc non Af Amer: 13 mL/min — ABNORMAL LOW (ref >60)
Glucose, Bld: 256 mg/dL — ABNORMAL HIGH (ref 70–99)
Potassium: 4.6 mmol/L (ref 3.5–5.1)
Sodium: 138 mmol/L (ref 135–145)
Total Bilirubin: 1 mg/dL (ref 0.3–1.2)
Total Protein: 5.7 g/dL — ABNORMAL LOW (ref 6.5–8.1)

## 2019-01-25 LAB — GLUCOSE, CAPILLARY
Glucose-Capillary: 202 mg/dL — ABNORMAL HIGH (ref 70–99)
Glucose-Capillary: 205 mg/dL — ABNORMAL HIGH (ref 70–99)
Glucose-Capillary: 215 mg/dL — ABNORMAL HIGH (ref 70–99)
Glucose-Capillary: 215 mg/dL — ABNORMAL HIGH (ref 70–99)
Glucose-Capillary: 219 mg/dL — ABNORMAL HIGH (ref 70–99)
Glucose-Capillary: 233 mg/dL — ABNORMAL HIGH (ref 70–99)
Glucose-Capillary: 249 mg/dL — ABNORMAL HIGH (ref 70–99)

## 2019-01-25 LAB — CBC
HCT: 22.8 % — ABNORMAL LOW (ref 39.0–52.0)
Hemoglobin: 7.8 g/dL — ABNORMAL LOW (ref 13.0–17.0)
MCH: 29.1 pg (ref 26.0–34.0)
MCHC: 34.2 g/dL (ref 30.0–36.0)
MCV: 85.1 fL (ref 80.0–100.0)
Platelets: 222 10*3/uL (ref 150–400)
RBC: 2.68 MIL/uL — ABNORMAL LOW (ref 4.22–5.81)
RDW: 16.1 % — ABNORMAL HIGH (ref 11.5–15.5)
WBC: 9.6 10*3/uL (ref 4.0–10.5)
nRBC: 0 % (ref 0.0–0.2)

## 2019-01-25 LAB — FERRITIN: Ferritin: 1409 ng/mL — ABNORMAL HIGH (ref 24–336)

## 2019-01-25 LAB — D-DIMER, QUANTITATIVE: D-Dimer, Quant: 3.8 ug/mL-FEU — ABNORMAL HIGH (ref 0.00–0.50)

## 2019-01-25 LAB — MAGNESIUM: Magnesium: 1.9 mg/dL (ref 1.7–2.4)

## 2019-01-25 LAB — C-REACTIVE PROTEIN: CRP: 8.1 mg/dL — ABNORMAL HIGH (ref <1.0)

## 2019-01-25 LAB — PROCALCITONIN: Procalcitonin: 1.46 ng/mL

## 2019-01-25 MED ORDER — CHLORHEXIDINE GLUCONATE CLOTH 2 % EX PADS
6.0000 | MEDICATED_PAD | Freq: Every day | CUTANEOUS | Status: DC
  Administered 2019-01-25 – 2019-02-04 (×8): 6 via TOPICAL

## 2019-01-25 MED ORDER — PREDNISONE 10 MG PO TABS
10.0000 mg | ORAL_TABLET | Freq: Every day | ORAL | Status: DC
  Administered 2019-02-04 – 2019-02-06 (×3): 10 mg via ORAL
  Filled 2019-01-25 (×4): qty 1

## 2019-01-25 MED ORDER — PREDNISONE 20 MG PO TABS
30.0000 mg | ORAL_TABLET | Freq: Every day | ORAL | Status: AC
  Administered 2019-01-29 – 2019-01-31 (×3): 30 mg via ORAL
  Filled 2019-01-25 (×4): qty 1

## 2019-01-25 MED ORDER — PREDNISONE 20 MG PO TABS
40.0000 mg | ORAL_TABLET | Freq: Every day | ORAL | Status: AC
  Administered 2019-01-26 – 2019-01-28 (×3): 40 mg via ORAL
  Filled 2019-01-25 (×3): qty 2

## 2019-01-25 MED ORDER — PREDNISONE 20 MG PO TABS
20.0000 mg | ORAL_TABLET | Freq: Every day | ORAL | Status: AC
  Administered 2019-02-01 – 2019-02-03 (×3): 20 mg via ORAL
  Filled 2019-01-25 (×4): qty 1

## 2019-01-25 NOTE — Progress Notes (Signed)
Page returned regarding blood sugar no changes need to be made at this time.

## 2019-01-25 NOTE — Progress Notes (Signed)
..    Vital Signs MEWS/VS Documentation      01/24/2019 1536 01/24/2019 1919 01/24/2019 2135 01/25/2019 0109   MEWS Score:  3  3  3  2    MEWS Score Color:  Yellow  Yellow  Yellow  Yellow   Resp:  (!) 28  -  -  (!) 28   Pulse:  95  -  -  -   BP:  (!) 149/66  -  (!) 164/73  (!) 174/74   Temp:  (!) 96.7 F (35.9 C)  -  -  (!) 97.1 F (36.2 C)   O2 Device:  Nasal Cannula  -  -  -   O2 Flow Rate (L/min):  2 L/min  -  -  -   Level of Consciousness:  Alert  -  -  -      No acute change. Pt was observed resting comfortably. Next set of vitals in now and another in 2 hours.     [place] 01/25/2019,3:03 AM

## 2019-01-25 NOTE — Progress Notes (Signed)
PROGRESS NOTE    Francisco Williamson  [id number] DOB: 08-Jan-1946 DOA: 01/11/2019 PCP: Default, Provider, MD   Brief Narrative:  73 y.o.BM PMHx Essential HTN and CKD stage V (s/p renal transplantation), deconditioning,  Presenting with AMS. He was admitted to [facility] from 10/28-11/3 for COVID-19-associated PNA with acute respiratory failure with hypoxia. He completed [name] course of Remdesivir and steroids, and he was discharged home on 11/3, patient was brought back to ED, given failure to thrive, dehydration and altered mental status, he was noted to have significant dehydration with hypernatremia and renal failure, he was transferred to [facility] for further management.  Transferred to [facility] on 11/17 due to possible need for HD.Marland Kitchen  Assessment & Plan:   Principal Problem:   Acute renal failure superimposed on chronic kidney disease ([place]) Active Problems:   CKD stage 5 secondary to hypertension ([place])   Pneumonia due to COVID-19 virus   Essential hypertension   History of renal transplant   Severe sepsis (HCC)   Sepsis secondary to UTI ([place])   HCAP (healthcare-associated pneumonia)   Bilateral pleural effusion   Acute hypernatremia   Goals of care, counseling/discussion   Palliative care by specialist   Protein-calorie malnutrition, severe  Goals of care: improved today, will continue treatment below and continue to engage with pt and family.  Previously palliative discussed rehab if he improves, home with hospice if not.  He's still focused on getting home, but discussed risks of leaving hospital at this time before he's well.  Recurrent Sepsis 2/2 Suspected HCAP?  HCAP/Covid 19 pneumonia  Acute Hypoxic Respiratory Failure - 11/20 with worsening resp status, lethargy - improved today 11/21 after abx and steroids started  - on 2 L, wean as tolerated - he's tolerated RA while I was at bedside - Worsening CXR on 11/20 concerning for HCAP vs related to COVID 19 infection, ? Aspiration  with AMS - CT chest 11/21 with concern for multifocal pneumonia vs HF - Blood cx from 11/20 pending, follow UA/cx - procalcitonin 2.24, downtrending 1.88 11/21 - He completed remdesivir and steroids 10/28-11/3 - Repeat course of remdesivir and steroids 11/14 - 11/18.  Convalescent plasma 11/14. - Cefepime for HCAP 11/8 -11/14.  Vancomycin 11/9 and 11/11. - Restarted on vanc/cefepime for HCAP on 11/20 with steroids added - last fever 11/20 - MRSA PCR was positive - follow sputum cx - plan for 7 days abx (will deescalate as able)  COVID-19 Labs  Recent Labs    01/23/19 0336 01/24/19 0517 01/25/19 0410  DDIMER 3.18* 3.14* 3.80*  FERRITIN 1,567* 1,441* 1,409*  CRP 9.7* 12.8* 8.1*    Lab Results  Component Value Date   SARSCOV2NAA POSITIVE ([name]) 12/31/2018   Acute on CKD stage V/RENAL transplant patient (Cr = 3.5)  NAGMA  Hypernatremia - creatinine worsened since admission (baseline appears in 3-4 range - most recently ~3.58 on 11/3) - he received lasix/albumin earlier in course of hospitalization - creatinine worsening today, 400 cc recorded UOP yesterday - Appreciate renal assistance -plan for HD on 11/23 - per renal, likely need dialysis long term - AKI on CKD thought related to illness and volume/free water depletion.  -I/O, daily weights - Continue prograf and cellcept as well as bactrim ppx - bicarb and free water per renal  Sepsis due to staph epidermidis UTI -Afebrile, negative leukocytosis.  With his renal function, the doses of vanc he received should have covered him  Bilateral pleural effusion/Hypoalbuminemia -See acute on CKD stage V  Essential HTN -Amlodipine  10 mg daily -Clonidine 0.2 mg BID -Hydralazine PRN -Labetalol PRN -11/13 increase Metoprolol 50 mg BID  GI bleed -11/10 PN notes small amount of blood with stool - Occult blood pending collection - this was positive - DVT ppx was held, but now restarted on 11/17 - hold DVT ppx  - Continue to follow  H/H today (relatively stable), transfuse for <7 - hemoccult positive - GI not planning for colonoscopy - continue to monitor at this time -11/13 transfuse 1 unit PRBC -Transfuse for hemoglobin<7   Hypernatremia - improved, appreciate renal assistance -See renal failure  Hypokalemia -See renal failure - K per renal  Hypomagnesmia - PO mag, follow  Hyperglycemia  Hypoglycemia - resume tube feeds -Sensitive SSI - A1c is 4.2  Diarrhea -Patient unaware of the BMs -Would also account for his hypokalemia. -Imodium -11/16 discontinue Sorbitol, bisacodyl, MiraLAX  Failure to thrive -Patient unable to care for himself at home.  Discharged on 11/3 and readmitted on 11/8.  Patient now with worsening acute on CKD stage V.  Clear that if patient goes home instead of to [name] is at high risk for complete renal failure of his transplanted kidney. -11/16 requested placement of CorTrak tube - Repeat SLP eval - started on dysphagia 1, thin liquids on 11/21 - he's not eating [name] lot PO, will continue to monitor and encourage oral feeds  DVT prophylaxis: SCD Code Status: DNR Family Communication: none at bedside - niece 11/21 Disposition Plan: pending    Consultants:   Renal   Procedures:   Cortrak placement on 11/17 Echo IMPRESSIONS    1. Left ventricular ejection fraction, by visual estimation, is 60 to 65%. The left ventricle has normal function. Left ventricular septal wall thickness was mildly increased. Mildly increased left ventricular posterior wall thickness. There is mildly  increased left ventricular hypertrophy.  2. Left ventricular diastolic parameters are consistent with Grade I diastolic dysfunction (impaired relaxation).  3. Global right ventricle has normal systolic function.The right ventricular size is normal. No increase in right ventricular wall thickness.  4. Left atrial size was severely dilated.  5. Right atrial size was mildly dilated.  6. The mitral  valve is normal in structure. No evidence of mitral valve regurgitation. No evidence of mitral stenosis.  7. The tricuspid valve is normal in structure. Tricuspid valve regurgitation is mild.  8. The aortic valve is grossly normal. Aortic valve regurgitation is not visualized. No evidence of aortic valve sclerosis or stenosis.  9. The pulmonic valve was normal in structure. Pulmonic valve regurgitation is not visualized. 10. Mildly elevated pulmonary artery systolic pressure. 11. The inferior vena cava is normal in size with greater than 50% respiratory variability, suggesting right atrial pressure of 3 mmHg.  Antimicrobials:  Anti-infectives (From admission, onward)   Start     Dose/Rate Route Frequency Ordered Stop   01/24/19 1800  ceFEPIme (MAXIPIME) 1 g in sodium chloride 0.9 % 100 mL IVPB     1 g 200 mL/hr over 30 Minutes Intravenous Every 24 hours 01/23/19 1122     01/23/19 1200  ceFEPIme (MAXIPIME) 1 g in sodium chloride 0.9 % 100 mL IVPB     1 g 200 mL/hr over 30 Minutes Intravenous  Once 01/23/19 1122 01/23/19 1452   01/23/19 1130  vancomycin (VANCOCIN) IVPB 1000 mg/200 mL premix     1,000 mg 200 mL/hr over 60 Minutes Intravenous  Once 01/23/19 1122 01/23/19 1623   01/23/19 1123  vancomycin variable dose per unstable renal  function (pharmacist dosing)      Does not apply See admin instructions 01/23/19 1123     01/17/19 1300  remdesivir 100 mg in sodium chloride 0.9 % 250 mL IVPB     100 mg 500 mL/hr over 30 Minutes Intravenous Daily 01/17/19 1144 01/21/19 1106   01/16/19 1800  vancomycin (VANCOCIN) 500 mg in sodium chloride 0.9 % 100 mL IVPB     500 mg 100 mL/hr over 60 Minutes Intravenous  Once 01/13/19 1758 01/16/19 2323   01/13/19 1900  vancomycin (VANCOCIN) IVPB 1000 mg/200 mL premix     1,000 mg 200 mL/hr over 60 Minutes Intravenous  Once 01/13/19 1758 01/13/19 2200   01/12/19 1800  ceFEPIme (MAXIPIME) 1 g in sodium chloride 0.9 % 100 mL IVPB     1 g 200 mL/hr over 30  Minutes Intravenous Every 24 hours 01/11/19 0910 01/17/19 1904   01/12/19 1600  sulfamethoxazole-trimethoprim (BACTRIM) 400-80 MG per tablet 1 tablet    Note to Pharmacy: Take one tablet by mouth on Monday, Wednesday and fridays.     1 tablet Oral Every M-W-F 01/12/19 1449     01/11/19 0830  vancomycin (VANCOCIN) IVPB 1000 mg/200 mL premix  Status:  Discontinued     1,000 mg 200 mL/hr over 60 Minutes Intravenous  Once 01/11/19 0825 01/11/19 0901   01/11/19 0830  ceFEPIme (MAXIPIME) 2 g in sodium chloride 0.9 % 100 mL IVPB     2 g 200 mL/hr over 30 Minutes Intravenous  Once 01/11/19 0825 01/11/19 0952     Subjective: No complaints today  Objective: Vitals:   01/25/19 0320 01/25/19 0500 01/25/19 0754 01/25/19 1222  BP: (!) 172/72  (!) 166/70 (!) 167/67  Pulse: 76  79 74  Resp: (!) 25  20 20   Temp: (!) 97.1 F (36.2 C)  (!) 97.2 F (36.2 C) 98.1 F (36.7 C)  TempSrc: Oral  Axillary Axillary  SpO2: 98%  98% 94%  Weight:  48.7 kg    Height:        Intake/Output Summary (Last 24 hours) at 01/25/2019 1414 Last data filed at 01/25/2019 0700 Gross per 24 hour  Intake -  Output 925 ml  Net -925 ml   Filed Weights   01/23/19 1415 01/24/19 0320 01/25/19 0500  Weight: 50 kg 54 kg 48.7 kg    Examination:  General: No acute distress. Cardiovascular: RRR Lungs: unlabored Abdomen: Soft, nontender, nondistended  Neurological: Alert and oriented 3. Moves all extremities 4 . Cranial nerves II through XII grossly intact. Skin: Warm and dry. No rashes or lesions. Extremities: No clubbing or cyanosis. No edema.   Data Reviewed: I have personally reviewed following labs and imaging studies  CBC: Recent Labs  Lab 01/20/19 0442 01/21/19 0235  01/22/19 0215  01/23/19 0336  01/23/19 1353 01/23/19 2108 01/24/19 0517 01/24/19 1935 01/25/19 0410  WBC 7.4 7.7  --  8.2  --  8.6  --   --   --  8.3  --  9.6  NEUTROABS 6.4 6.7  --  6.6  --  6.0  --   --   --  7.6  --   --   HGB  9.1* 8.0*   < > 7.5*   < > 7.1*   < > 7.5* 8.4* 7.9* 8.4* 7.8*  HCT 27.6* 22.9*   < > 22.6*   < > 21.2*   < > 22.0* 24.6* 23.1* 24.7* 22.8*  MCV 85.7 83.0  --  85.9  --  83.8  --   --   --  83.1  --  85.1  PLT 137* 119*  --  117*  --  118*  --   --   --  146*  --  222   < > = values in this interval not displayed.   Basic Metabolic Panel: Recent Labs  Lab 01/19/19 0450 01/20/19 0442 01/21/19 0235 01/22/19 0215 01/23/19 0336 01/23/19 1353 01/24/19 0517 01/25/19 0410  NA 153* 150* 139 140 139 138 137 138  K 4.2 3.8 3.7 3.1* 2.7* 3.2* 4.1 4.6  CL 120* 116* 111 107 98  --  100 100  CO2 21* 19* 16* 18* 31  --  29 28  GLUCOSE 206* 164* 232* 163* 116*  --  117* 256*  BUN 142* 122* 127* 136* 70*  --  65* 93*  CREATININE 5.78* 5.66* 5.82* 5.64* 3.70*  --  3.38* 4.39*  CALCIUM 8.9 8.5* 7.6* 7.0* 7.0*  --  7.4* 7.8*  MG 2.2 1.9 1.7 1.6* 1.6*  --  1.7 1.9  PHOS 5.4* 5.3* 5.6* 5.1* 2.9  --   --   --    GFR: Estimated Creatinine Clearance: 10.6 mL/min ([place] Chelf) (by C-G formula based on SCr of 4.39 mg/dL (H)). Liver Function Tests: Recent Labs  Lab 01/21/19 0235 01/22/19 0215 01/23/19 0336 01/24/19 0517 01/25/19 0410  AST 21 49* 37 26 24  ALT 19 49* 41 31 33  ALKPHOS 71 87 84 74 86  BILITOT 1.0 0.6 0.8 1.2 1.0  PROT 6.2* 5.9* 5.6* 5.5* 5.7*  ALBUMIN 1.8* 1.7* 1.5* 1.4* 1.5*   No results for input(s): LIPASE, AMYLASE in the last 168 hours. No results for input(s): AMMONIA in the last 168 hours. Coagulation Profile: No results for input(s): INR, PROTIME in the last 168 hours. Cardiac Enzymes: No results for input(s): CKTOTAL, CKMB, CKMBINDEX, TROPONINI in the last 168 hours. BNP (last 3 results) No results for input(s): PROBNP in the last 8760 hours. HbA1C: No results for input(s): HGBA1C in the last 72 hours. CBG: Recent Labs  Lab 01/24/19 1941 01/25/19 0106 01/25/19 0304 01/25/19 0752 01/25/19 1222  GLUCAP 180* 205* 233* 249* 202*   Lipid Profile: No results for input(s):  CHOL, HDL, LDLCALC, TRIG, CHOLHDL, LDLDIRECT in the last 72 hours. Thyroid Function Tests: No results for input(s): TSH, T4TOTAL, FREET4, T3FREE, THYROIDAB in the last 72 hours. Anemia Panel: Recent Labs    01/22/19 1631  01/24/19 0517 01/25/19 0410  VITAMINB12 381  --   --   --   FOLATE 4.0*  --   --   --   FERRITIN  --    < > 1,441* 1,409*  TIBC NOT CALCULATED  --   --   --   IRON 69  --   --   --    < > = values in this interval not displayed.   Sepsis Labs: Recent Labs  Lab 01/23/19 1113 01/23/19 1322 01/24/19 0517 01/25/19 0410  PROCALCITON 2.24  --  1.88 1.46  LATICACIDVEN 1.0 0.9  --   --     Recent Results (from the past 240 hour(s))  Culture, blood (routine x 2)     Status: None   Collection Time: 01/16/19  7:45 AM   Specimen: BLOOD  Result Value Ref Range Status   Specimen Description   Final    BLOOD RIGHT ANTECUBITAL Performed at [facility] [address] [place], [place] [address]    Special Requests  Final    BOTTLES DRAWN AEROBIC ONLY Blood Culture adequate volume Performed at [facility] [address], [place], [place] [address]    Culture   Final    NO GROWTH 5 DAYS Performed at [facility], [place] [address], [place], [place] [address]    Report Status 01/21/2019 FINAL  Final  Culture, blood (routine x 2)     Status: None   Collection Time: 01/16/19  7:50 AM   Specimen: BLOOD  Result Value Ref Range Status   Specimen Description   Final    BLOOD RIGHT HAND Performed at [facility] [address] [place], [place] [address]    Special Requests   Final    BOTTLES DRAWN AEROBIC ONLY Blood Culture adequate volume Performed at [facility] [address], [place], [place] [address]    Culture   Final    NO GROWTH 5 DAYS Performed at [facility], [place] [address] [place], [place] [address]    Report Status 01/21/2019 FINAL  Final  Culture, blood  (routine x 2)     Status: None (Preliminary result)   Collection Time: 01/23/19 11:00 AM   Specimen: BLOOD RIGHT HAND  Result Value Ref Range Status   Specimen Description BLOOD RIGHT HAND  Final   Special Requests   Final    BOTTLES DRAWN AEROBIC ONLY Blood Culture results may not be optimal due to an inadequate volume of blood received in culture bottles   Culture   Final    NO GROWTH 2 DAYS Performed at [facility], [place] [address], [place], [place] [address]    Report Status PENDING  Incomplete  Culture, blood (routine x 2)     Status: None (Preliminary result)   Collection Time: 01/23/19 11:11 AM   Specimen: BLOOD RIGHT HAND  Result Value Ref Range Status   Specimen Description BLOOD RIGHT HAND  Final   Special Requests   Final    BOTTLES DRAWN AEROBIC ONLY Blood Culture adequate volume   Culture   Final    NO GROWTH 2 DAYS Performed at [facility], [place] [address] [place], [place] [address]    Report Status PENDING  Incomplete  MRSA PCR Screening     Status: Abnormal   Collection Time: 01/23/19 11:18 AM   Specimen: Nasal Mucosa; Nasopharyngeal  Result Value Ref Range Status   MRSA by PCR POSITIVE (Tenicia Gural) NEGATIVE Final    Comment:        The GeneXpert MRSA Assay (FDA approved for NASAL specimens only), is one component of Janaya Broy comprehensive MRSA colonization surveillance program. It is not intended to diagnose MRSA infection nor to guide or monitor treatment for MRSA infections. RESULT CALLED TO, READ BACK BY AND VERIFIED WITH: Dennison Bulla RN 15:20 01/23/19 ([email or web address]) Performed at [facility], [place] [address] [place], [place] [address]          Radiology Studies: Ct Chest Wo Contrast  Result Date: 01/24/2019 CLINICAL DATA:  Short of breath. Patient presented to the emergency room with altered mental status. Patient is suspected of pneumonia, possible COVID-19 pneumonia. EXAM: CT CHEST WITHOUT CONTRAST TECHNIQUE: Multidetector CT imaging of the  chest was performed following the standard protocol without IV contrast. COMPARISON:  None. FINDINGS: Cardiovascular: Mild enlargement of the heart. No pericardial effusion. Left coronary artery calcifications. Mild atherosclerotic calcifications along the aortic arch. Mediastinum/Nodes: No neck base, axillary, mediastinal or hilar masses.  No enlarged lymph nodes. Trachea is enlarged 2.7 cm anterior-posterior. Nasal/orogastric tube passes through the esophagus, tip in the distal stomach. Lungs/Pleura: Small bilateral pleural effusions. Lungs demonstrate Macaria Bias combination of consolidation and ground-glass airspace opacities, as well as interstitial type opacities, the latter most evident in the lower lungs. No lung mass or discrete nodule. No pneumothorax. Upper Abdomen: Several low-density liver lesions are noted consistent with cysts. Nonobstructing stone in the upper pole the right kidney. No acute finding. Musculoskeletal: No fracture or acute finding.  No bone lesion. IMPRESSION: 1. Lung opacities include consolidation, ground-glass opacities and interstitial opacities, associated with small effusions. There is also mild cardiomegaly. The combination of findings may all be due to congestive heart failure with pulmonary edema. Multifocal pneumonia or Denali Sharma combination of both should be considered in the proper clinical setting. 2. Mild aortic atherosclerosis. Left coronary artery calcifications. Aortic Atherosclerosis (ICD10-I70.0). Electronically Signed   By: Lajean Manes M.D.   On: 01/24/2019 13:57        Scheduled Meds: . sodium chloride   Intravenous Once  . amLODipine  10 mg Oral Daily  . Chlorhexidine Gluconate Cloth  6 each Topical Q0600  . Chlorhexidine Gluconate Cloth  6 each Topical Q0600  . cloNIDine  0.2 mg Oral BID  . darbepoetin (ARANESP) injection - NON-DIALYSIS  150 mcg Subcutaneous Q Wed-1800  . feeding supplement (ENSURE ENLIVE)  237 mL Oral TID BM  . folic acid  1 mg Oral Daily  . free  water  200 mL Per Tube Q8H  . magnesium oxide  400 mg Oral BID WC  . mouth rinse  15 mL Mouth Rinse BID  . methylPREDNISolone (SOLU-MEDROL) injection  40 mg Intravenous Q12H  . metoprolol tartrate  50 mg Oral BID  . mupirocin ointment   Nasal BID  . mycophenolate  500 mg Oral BID  . pantoprazole  40 mg Oral BID  . sodium chloride flush  3 mL Intravenous Q12H  . sulfamethoxazole-trimethoprim  1 tablet Oral Q M,W,F  . tacrolimus  4 mg Oral BID  . vancomycin variable dose per unstable renal function (pharmacist dosing)   Does not apply See admin instructions   Continuous Infusions: . sodium chloride 10 mL/hr at 01/23/19 1900  . sodium chloride    . sodium chloride    . ceFEPime (MAXIPIME) IV 1 g (01/24/19 1826)  . feeding supplement (VITAL AF 1.2 CAL) 1,000 mL (01/24/19 1253)     LOS: 14 days    Time spent: over 17 min    Fayrene Helper, MD [facility] Pager AMION  If 7PM-7AM, please contact night-coverage [email or web address] Password [facility] 01/25/2019, 2:14 PM

## 2019-01-25 NOTE — Progress Notes (Signed)
Subjective:  Pt continues to rebound from events on 11/20.  Is hemodynamically stable-  Making urine but kidney does not seem to be providing clearance -  BUN and crt up-  BUN up from 60's to 90's    Objective Vital signs in last 24 hours: Vitals:   01/24/19 2135 01/25/19 0109 01/25/19 0320 01/25/19 0500  BP: (!) 164/73 (!) 174/74 (!) 172/72   Pulse:   76   Resp:  (!) 28 (!) 25   Temp:  (!) 97.1 F (36.2 C) (!) 97.1 F (36.2 C)   TempSrc:  Oral Oral   SpO2:  99% 98%   Weight:    48.7 kg  Height:       Weight change: -0.3 kg  Intake/Output Summary (Last 24 hours) at 01/25/2019 [id number] Last data filed at 01/25/2019 0700 Gross per 24 hour  Intake -  Output 925 ml  Net -925 ml    Assessment/ Plan: Pt is a 73 y.o. yo male s/p renal transplant but with CKD at baseline crt in the 3's who was admitted on 01/11/2019 with weakness after COVID treatment  Assessment/Plan: 1. Renal-  History of fairly advanced CKD (crt 3's) and renal transplant.  A on chronic change due to illness and volume/free water depletion- now his free water deficit has been corrected but BUN was still high at 136.    since not thriving I had spoken with family and the patient- we wanted to attempt a trial of HD to see if can clear MS more and improve outlook-  first short treatment 11/19-  Second shorter on 11/20 due to some instability.  This brought his BUN down to the 60's-  Is making urine.  However, As I feared- this kidney is not providing clearance for him- BUN now up from 60 to 90-  It was struggling at baseline before COVID.  I really feel we need to re initiate dialysis and it would probably be needed long term.  Family has told me to do what we need  to do--- patient has been resistant and due to covid restrictions I am not able to explain to him what he needs in the most effective manner.  No absolute need for HD today but will write orders for tomorrow and hope that the patient comes around.  If he refuses HD then  will likely need palliative care at that point  2. S/p renal transplant-  Is on prograf and very low dose cellcept as well as decadron  - cont for now-   3. Anemia- is on procrit normally for anemia as OP-  Supportive therapy - giving ESA and given one unit of blood on 11/20 4.  Metabolic acidosis-  Was giving bicarb repletion - will stop now as is corrected with brief HD  5. HTN/volume-  Hypertensive.  continue with antihypertensivies 6.   Hypernatremia- corrected - backed off on free water repletion  7.  Hypokalemia-  Resolved 8.  Dispo-  Palliative care states if no improvement transition to comfort care.  He has seemed to improve.  See my plan above      Louis Meckel    Labs: Basic Metabolic Panel: Recent Labs  Lab 01/21/19 0235 01/22/19 0215 01/23/19 0336 01/23/19 1353 01/24/19 0517 01/25/19 0410  NA 139 140 139 138 137 138  K 3.7 3.1* 2.7* 3.2* 4.1 4.6  CL 111 107 98  --  100 100  CO2 16* 18* 31  --  29 28  GLUCOSE 232* 163* 116*  --  117* 256*  BUN 127* 136* 70*  --  65* 93*  CREATININE 5.82* 5.64* 3.70*  --  3.38* 4.39*  CALCIUM 7.6* 7.0* 7.0*  --  7.4* 7.8*  PHOS 5.6* 5.1* 2.9  --   --   --    Liver Function Tests: Recent Labs  Lab 01/23/19 0336 01/24/19 0517 01/25/19 0410  AST 37 26 24  ALT 41 31 33  ALKPHOS 84 74 86  BILITOT 0.8 1.2 1.0  PROT 5.6* 5.5* 5.7*  ALBUMIN 1.5* 1.4* 1.5*   No results for input(s): LIPASE, AMYLASE in the last 168 hours. No results for input(s): AMMONIA in the last 168 hours. CBC: Recent Labs  Lab 01/21/19 0235  01/22/19 0215  01/23/19 0336  01/24/19 0517 01/24/19 1935 01/25/19 0410  WBC 7.7  --  8.2  --  8.6  --  8.3  --  9.6  NEUTROABS 6.7  --  6.6  --  6.0  --  7.6  --   --   HGB 8.0*   < > 7.5*   < > 7.1*   < > 7.9* 8.4* 7.8*  HCT 22.9*   < > 22.6*   < > 21.2*   < > 23.1* 24.7* 22.8*  MCV 83.0  --  85.9  --  83.8  --  83.1  --  85.1  PLT 119*  --  117*  --  118*  --  146*  --  222   < > = values in this  interval not displayed.   Cardiac Enzymes: No results for input(s): CKTOTAL, CKMB, CKMBINDEX, TROPONINI in the last 168 hours. CBG: Recent Labs  Lab 01/24/19 0934 01/24/19 1505 01/24/19 1941 01/25/19 0106 01/25/19 0304  GLUCAP 150* 185* 180* 205* 233*    Iron Studies:  Recent Labs    01/22/19 1631  01/25/19 0410  IRON 69  --   --   TIBC NOT CALCULATED  --   --   FERRITIN  --    < > 1,409*   < > = values in this interval not displayed.   Studies/Results: Ct Chest Wo Contrast  Result Date: 01/24/2019 CLINICAL DATA:  Short of breath. Patient presented to the emergency room with altered mental status. Patient is suspected of pneumonia, possible COVID-19 pneumonia. EXAM: CT CHEST WITHOUT CONTRAST TECHNIQUE: Multidetector CT imaging of the chest was performed following the standard protocol without IV contrast. COMPARISON:  None. FINDINGS: Cardiovascular: Mild enlargement of the heart. No pericardial effusion. Left coronary artery calcifications. Mild atherosclerotic calcifications along the aortic arch. Mediastinum/Nodes: No neck base, axillary, mediastinal or hilar masses. No enlarged lymph nodes. Trachea is enlarged 2.7 cm anterior-posterior. Nasal/orogastric tube passes through the esophagus, tip in the distal stomach. Lungs/Pleura: Small bilateral pleural effusions. Lungs demonstrate a combination of consolidation and ground-glass airspace opacities, as well as interstitial type opacities, the latter most evident in the lower lungs. No lung mass or discrete nodule. No pneumothorax. Upper Abdomen: Several low-density liver lesions are noted consistent with cysts. Nonobstructing stone in the upper pole the right kidney. No acute finding. Musculoskeletal: No fracture or acute finding.  No bone lesion. IMPRESSION: 1. Lung opacities include consolidation, ground-glass opacities and interstitial opacities, associated with small effusions. There is also mild cardiomegaly. The combination of  findings may all be due to congestive heart failure with pulmonary edema. Multifocal pneumonia or a combination of both should be considered in the proper clinical setting. 2. Mild aortic  atherosclerosis. Left coronary artery calcifications. Aortic Atherosclerosis (ICD10-I70.0). Electronically Signed   By: Lajean Manes M.D.   On: 01/24/2019 13:57   Dg Chest Port 1 View  Result Date: 01/23/2019 CLINICAL DATA:  Shortness of breath.  COVID-19 positive EXAM: PORTABLE CHEST 1 VIEW COMPARISON:  Yesterday FINDINGS: Extensive bilateral airspace disease that has worsened. Feeding tube which at least reaches the stomach. Cardiomegaly. No visible effusion or pneumothorax. IMPRESSION: Worsening bilateral pneumonia. Electronically Signed   By: Monte Fantasia M.D.   On: 01/23/2019 09:38   Medications: Infusions: . sodium chloride 10 mL/hr at 01/23/19 1900  . sodium chloride    . sodium chloride    . ceFEPime (MAXIPIME) IV 1 g (01/24/19 1826)  . feeding supplement (VITAL AF 1.2 CAL) 1,000 mL (01/24/19 1253)    Scheduled Medications: . sodium chloride   Intravenous Once  . amLODipine  10 mg Oral Daily  . Chlorhexidine Gluconate Cloth  6 each Topical Q0600  . cloNIDine  0.2 mg Oral BID  . darbepoetin (ARANESP) injection - NON-DIALYSIS  150 mcg Subcutaneous Q Wed-1800  . feeding supplement (ENSURE ENLIVE)  237 mL Oral TID BM  . folic acid  1 mg Oral Daily  . free water  200 mL Per Tube Q8H  . magnesium oxide  400 mg Oral BID WC  . mouth rinse  15 mL Mouth Rinse BID  . methylPREDNISolone (SOLU-MEDROL) injection  40 mg Intravenous Q12H  . metoprolol tartrate  50 mg Oral BID  . mupirocin ointment   Nasal BID  . mycophenolate  500 mg Oral BID  . pantoprazole  40 mg Oral BID  . sodium chloride flush  3 mL Intravenous Q12H  . sulfamethoxazole-trimethoprim  1 tablet Oral Q M,W,F  . tacrolimus  4 mg Oral BID  . vancomycin variable dose per unstable renal function (pharmacist dosing)   Does not apply See  admin instructions    have reviewed scheduled and prn medications.  Physical Exam: PE not performed as patient in isolation for COVD in order to preserve PPE and to avoid exposure to more providers.  Case discussed with CCM  01/25/2019,7:27 AM  LOS: 14 days

## 2019-01-25 NOTE — Progress Notes (Signed)
Multiple pages made regarding pt blood sugar increase. Pt BS was at 180 and has now increased to 230. Awaiting a response.

## 2019-01-26 LAB — COMPREHENSIVE METABOLIC PANEL
ALT: 26 U/L (ref 0–44)
AST: 15 U/L (ref 15–41)
Albumin: 1.5 g/dL — ABNORMAL LOW (ref 3.5–5.0)
Alkaline Phosphatase: 85 U/L (ref 38–126)
Anion gap: 9 (ref 5–15)
BUN: 109 mg/dL — ABNORMAL HIGH (ref 8–23)
CO2: 26 mmol/L (ref 22–32)
Calcium: 7.4 mg/dL — ABNORMAL LOW (ref 8.9–10.3)
Chloride: 103 mmol/L (ref 98–111)
Creatinine, Ser: 4.51 mg/dL — ABNORMAL HIGH (ref 0.61–1.24)
GFR calc Af Amer: 14 mL/min — ABNORMAL LOW (ref >60)
GFR calc non Af Amer: 12 mL/min — ABNORMAL LOW (ref >60)
Glucose, Bld: 237 mg/dL — ABNORMAL HIGH (ref 70–99)
Potassium: 3.8 mmol/L (ref 3.5–5.1)
Sodium: 138 mmol/L (ref 135–145)
Total Bilirubin: 0.8 mg/dL (ref 0.3–1.2)
Total Protein: 5.6 g/dL — ABNORMAL LOW (ref 6.5–8.1)

## 2019-01-26 LAB — GLUCOSE, CAPILLARY
Glucose-Capillary: 216 mg/dL — ABNORMAL HIGH (ref 70–99)
Glucose-Capillary: 197 mg/dL — ABNORMAL HIGH (ref 70–99)
Glucose-Capillary: 196 mg/dL — ABNORMAL HIGH (ref 70–99)
Glucose-Capillary: 222 mg/dL — ABNORMAL HIGH (ref 70–99)
Glucose-Capillary: 210 mg/dL — ABNORMAL HIGH (ref 70–99)
Glucose-Capillary: 222 mg/dL — ABNORMAL HIGH (ref 70–99)

## 2019-01-26 LAB — FERRITIN: Ferritin: 1144 ng/mL — ABNORMAL HIGH (ref 24–336)

## 2019-01-26 LAB — CBC
HCT: 22.7 % — ABNORMAL LOW (ref 39.0–52.0)
Hemoglobin: 7.5 g/dL — ABNORMAL LOW (ref 13.0–17.0)
MCH: 28.4 pg (ref 26.0–34.0)
MCHC: 33 g/dL (ref 30.0–36.0)
MCV: 86 fL (ref 80.0–100.0)
Platelets: 267 10*3/uL (ref 150–400)
RBC: 2.64 MIL/uL — ABNORMAL LOW (ref 4.22–5.81)
RDW: 16.3 % — ABNORMAL HIGH (ref 11.5–15.5)
WBC: 9 10*3/uL (ref 4.0–10.5)
nRBC: 0.4 % — ABNORMAL HIGH (ref 0.0–0.2)

## 2019-01-26 LAB — D-DIMER, QUANTITATIVE: D-Dimer, Quant: 2.38 ug/mL-FEU — ABNORMAL HIGH (ref 0.00–0.50)

## 2019-01-26 LAB — MAGNESIUM: Magnesium: 2 mg/dL (ref 1.7–2.4)

## 2019-01-26 LAB — C-REACTIVE PROTEIN: CRP: 4.4 mg/dL — ABNORMAL HIGH (ref <1.0)

## 2019-01-26 MED ORDER — VITAL AF 1.2 CAL PO LIQD
1000.0000 mL | ORAL | Status: DC
  Administered 2019-01-26 – 2019-02-01 (×6): 1000 mL
  Filled 2019-01-26 (×10): qty 1000

## 2019-01-26 NOTE — Progress Notes (Addendum)
PROGRESS NOTE    Francisco Williamson  [id number] DOB: 06/22/1945 DOA: 01/11/2019 PCP: Default, Provider, MD   Brief Narrative:  73 y.o.BM PMHx Essential HTN and CKD stage V (s/p renal transplantation), deconditioning,  Presenting with AMS. He was admitted to [facility] from 10/28-11/3 for COVID-19-associated PNA with acute respiratory failure with hypoxia. He completed [facility] course of Remdesivir and steroids, and he was discharged home on 11/3, patient was brought back to ED, given failure to thrive, dehydration and altered mental status, he was noted to have significant dehydration with hypernatremia and renal failure, he was transferred to [facility] for further management.  Transferred to [facility] on 11/17 due to possible need for HD.Marland Kitchen  Assessment & Plan:   Principal Problem:   Acute renal failure superimposed on chronic kidney disease ([place]) Active Problems:   CKD stage 5 secondary to hypertension ([place])   Pneumonia due to COVID-19 virus   Essential hypertension   History of renal transplant   Severe sepsis ([facility])   Sepsis secondary to UTI ([place])   HCAP (healthcare-associated pneumonia)   Bilateral pleural effusion   Acute hypernatremia   Goals of care, counseling/discussion   Palliative care by specialist   Protein-calorie malnutrition, severe  Goals of care: will continue treatment below and continue to engage with pt and family.  Previously palliative discussed rehab if he improves, home with hospice if not.  He's still focused on getting home, but discussed risks of leaving hospital at this time before he's well.  Recurrent Sepsis 2/2 Suspected HCAP?   HCAP/Covid 19 pneumonia   Acute Hypoxic Respiratory Failure - 11/20 with worsening resp status, lethargy - improved today 11/21 after abx and steroids started  - Continues to do well on RA - Worsening CXR on 11/20 concerning for HCAP vs related to COVID 19 infection, ? Aspiration with AMS - CT chest 11/21 with concern for multifocal  pneumonia vs HF - Blood cx from 11/20 pending, follow UA/cx - procalcitonin 2.24, downtrending 1.88 11/21 - He completed remdesivir and steroids 10/28-11/3 - Repeat course of remdesivir and steroids 11/14 - 11/18.  Convalescent plasma 11/14. - Cefepime for HCAP 11/8 -11/14.  Vancomycin 11/9 and 11/11. - Restarted on vanc/cefepime for HCAP on 11/20.  Taper steroids.  - last fever 11/20 - MRSA PCR was positive - follow sputum cx - plan for 7 days abx (will deescalate as able)  COVID-19 Labs  Recent Labs    01/24/19 0517 01/25/19 0410 01/26/19 0500  DDIMER 3.14* 3.80* 2.38*  FERRITIN 1,441* 1,409* 1,144*  CRP 12.8* 8.1* 4.4*    Lab Results  Component Value Date   SARSCOV2NAA POSITIVE (Francisco Williamson) 12/31/2018   Acute on CKD stage V/RENAL transplant patient (Cr = 3.5)   NAGMA   Hypernatremia - creatinine worsened since admission (baseline appears in 3-4 range - most recently ~3.58 on 11/3) - he received lasix/albumin earlier in course of hospitalization - creatinine worsening today, 400 cc recorded UOP yesterday - Appreciate renal assistance -plan for HD on 11/23 - per renal, likely need dialysis long term - AKI on CKD thought related to illness and volume/free water depletion.  -I/O, daily weights - Continue prograf and cellcept as well as bactrim ppx - bicarb and free water per renal  Failure to thrive -Patient unable to care for himself at home.  Discharged on 11/3 and readmitted on 11/8.  Patient now with worsening acute on CKD stage V.  Clear that if patient goes home instead of to [facility] is at high  risk for complete renal failure of his transplanted kidney. -11/16 requested placement of CorTrak tube - Repeat SLP eval - started on dysphagia 1, thin liquids on 11/21 - will transition his tube feeds to nighttime feeds and off during the day, hopefully, he'll have an appetite during the day and be able to take more PO  Sepsis due to staph epidermidis UTI -Afebrile, negative leukocytosis.   With his renal function, the doses of vanc he received should have covered him  Bilateral pleural effusion/Hypoalbuminemia -See acute on CKD stage V  Essential HTN -Amlodipine 10 mg daily -Clonidine 0.2 mg BID -Hydralazine PRN -Labetalol PRN -11/13 increase Metoprolol 50 mg BID  GI bleed -11/10 PN notes small amount of blood with stool - Occult blood pending collection - this was positive - DVT ppx was held, but now restarted on 11/17 - hold DVT ppx  - Continue to follow H/H today (relatively stable), transfuse for <7 - hemoccult positive - GI not planning for colonoscopy - continue to monitor at this time -11/13 transfuse 1 unit PRBC -Transfuse for hemoglobin<7   Hypernatremia - improved, appreciate renal assistance -See renal failure  Hypokalemia -See renal failure - K per renal  Hypomagnesmia - PO mag, follow  Hyperglycemia   Hypoglycemia - resume tube feeds -Sensitive SSI - A1c is 4.2  Diarrhea -Patient unaware of the BMs -Would also account for his hypokalemia. -Imodium -11/16 discontinue Sorbitol, bisacodyl, MiraLAX  DVT prophylaxis: SCD Code Status: DNR Family Communication: none at bedside - niece 11/22, no answer 11/23 Disposition Plan: pending    Consultants:   Renal   Procedures:   Cortrak placement on 11/17 Echo IMPRESSIONS    1. Left ventricular ejection fraction, by visual estimation, is 60 to 65%. The left ventricle has normal function. Left ventricular septal wall thickness was mildly increased. Mildly increased left ventricular posterior wall thickness. There is mildly  increased left ventricular hypertrophy.  2. Left ventricular diastolic parameters are consistent with Grade I diastolic dysfunction (impaired relaxation).  3. Global right ventricle has normal systolic function.The right ventricular size is normal. No increase in right ventricular wall thickness.  4. Left atrial size was severely dilated.  5. Right atrial size  was mildly dilated.  6. The mitral valve is normal in structure. No evidence of mitral valve regurgitation. No evidence of mitral stenosis.  7. The tricuspid valve is normal in structure. Tricuspid valve regurgitation is mild.  8. The aortic valve is grossly normal. Aortic valve regurgitation is not visualized. No evidence of aortic valve sclerosis or stenosis.  9. The pulmonic valve was normal in structure. Pulmonic valve regurgitation is not visualized. 10. Mildly elevated pulmonary artery systolic pressure. 11. The inferior vena cava is normal in size with greater than 50% respiratory variability, suggesting right atrial pressure of 3 mmHg.  Antimicrobials:  Anti-infectives (From admission, onward)   Start     Dose/Rate Route Frequency Ordered Stop   01/24/19 1800  ceFEPIme (MAXIPIME) 1 g in sodium chloride 0.9 % 100 mL IVPB     1 g 200 mL/hr over 30 Minutes Intravenous Every 24 hours 01/23/19 1122     01/23/19 1200  ceFEPIme (MAXIPIME) 1 g in sodium chloride 0.9 % 100 mL IVPB     1 g 200 mL/hr over 30 Minutes Intravenous  Once 01/23/19 1122 01/23/19 1452   01/23/19 1130  vancomycin (VANCOCIN) IVPB 1000 mg/200 mL premix     1,000 mg 200 mL/hr over 60 Minutes Intravenous  Once 01/23/19 1122 01/23/19  1623   01/23/19 1123  vancomycin variable dose per unstable renal function (pharmacist dosing)      Does not apply See admin instructions 01/23/19 1123     01/17/19 1300  remdesivir 100 mg in sodium chloride 0.9 % 250 mL IVPB     100 mg 500 mL/hr over 30 Minutes Intravenous Daily 01/17/19 1144 01/21/19 1106   01/16/19 1800  vancomycin (VANCOCIN) 500 mg in sodium chloride 0.9 % 100 mL IVPB     500 mg 100 mL/hr over 60 Minutes Intravenous  Once 01/13/19 1758 01/16/19 2323   01/13/19 1900  vancomycin (VANCOCIN) IVPB 1000 mg/200 mL premix     1,000 mg 200 mL/hr over 60 Minutes Intravenous  Once 01/13/19 1758 01/13/19 2200   01/12/19 1800  ceFEPIme (MAXIPIME) 1 g in sodium chloride 0.9 % 100  mL IVPB     1 g 200 mL/hr over 30 Minutes Intravenous Every 24 hours 01/11/19 0910 01/17/19 1904   01/12/19 1600  sulfamethoxazole-trimethoprim (BACTRIM) 400-80 MG per tablet 1 tablet    Note to Pharmacy: Take one tablet by mouth on Monday, Wednesday and fridays.     1 tablet Oral Every M-W-F 01/12/19 1449     01/11/19 0830  vancomycin (VANCOCIN) IVPB 1000 mg/200 mL premix  Status:  Discontinued     1,000 mg 200 mL/hr over 60 Minutes Intravenous  Once 01/11/19 0825 01/11/19 0901   01/11/19 0830  ceFEPIme (MAXIPIME) 2 g in sodium chloride 0.9 % 100 mL IVPB     2 g 200 mL/hr over 30 Minutes Intravenous  Once 01/11/19 0825 01/11/19 0952     Subjective: Danon Lograsso&Ox3 Mikaia Janvier little slow to wake up, but appropriate after waking  Objective: Vitals:   01/26/19 1355 01/26/19 1425 01/26/19 1450 01/26/19 1520  BP: (!) 141/71 (!) 126/55 139/63 129/61  Pulse: 81 83 79 77  Resp: (!) 22 (!) 22 20 20   Temp:      TempSrc:      SpO2:      Weight:      Height:        Intake/Output Summary (Last 24 hours) at 01/26/2019 1548 Last data filed at 01/26/2019 0905 Gross per 24 hour  Intake 1265.94 ml  Output 550 ml  Net 715.94 ml   Filed Weights   01/25/19 0500 01/26/19 0432 01/26/19 1200  Weight: 48.7 kg 50.5 kg 50 kg    Examination:  General: No acute distress. Cardiovascular: RRR Lungs: unlabored Abdomen: Soft, nontender, nondistended  Neurological: Alert and oriented 3. Moves all extremities 4. Cranial nerves II through XII grossly intact. Skin: Warm and dry. No rashes or lesions. Extremities: No clubbing or cyanosis. No edema.   Data Reviewed: I have personally reviewed following labs and imaging studies  CBC: Recent Labs  Lab 01/20/19 0442 01/21/19 0235  01/22/19 0215  01/23/19 0336  01/23/19 2108 01/24/19 0517 01/24/19 1935 01/25/19 0410 01/26/19 0500  WBC 7.4 7.7  --  8.2  --  8.6  --   --  8.3  --  9.6 9.0  NEUTROABS 6.4 6.7  --  6.6  --  6.0  --   --  7.6  --   --   --   HGB  9.1* 8.0*   < > 7.5*   < > 7.1*   < > 8.4* 7.9* 8.4* 7.8* 7.5*  HCT 27.6* 22.9*   < > 22.6*   < > 21.2*   < > 24.6* 23.1* 24.7* 22.8* 22.7*  MCV  85.7 83.0  --  85.9  --  83.8  --   --  83.1  --  85.1 86.0  PLT 137* 119*  --  117*  --  118*  --   --  146*  --  222 267   < > = values in this interval not displayed.   Basic Metabolic Panel: Recent Labs  Lab 01/20/19 0442 01/21/19 0235 01/22/19 0215 01/23/19 0336 01/23/19 1353 01/24/19 0517 01/25/19 0410 01/26/19 0500  NA 150* 139 140 139 138 137 138 138  K 3.8 3.7 3.1* 2.7* 3.2* 4.1 4.6 3.8  CL 116* 111 107 98  --  100 100 103  CO2 19* 16* 18* 31  --  29 28 26   GLUCOSE 164* 232* 163* 116*  --  117* 256* 237*  BUN 122* 127* 136* 70*  --  65* 93* 109*  CREATININE 5.66* 5.82* 5.64* 3.70*  --  3.38* 4.39* 4.51*  CALCIUM 8.5* 7.6* 7.0* 7.0*  --  7.4* 7.8* 7.4*  MG 1.9 1.7 1.6* 1.6*  --  1.7 1.9 2.0  PHOS 5.3* 5.6* 5.1* 2.9  --   --   --   --    GFR: Estimated Creatinine Clearance: 10.6 mL/min (Keierra Nudo) (by C-G formula based on SCr of 4.51 mg/dL (H)). Liver Function Tests: Recent Labs  Lab 01/22/19 0215 01/23/19 0336 01/24/19 0517 01/25/19 0410 01/26/19 0500  AST 49* 37 26 24 15   ALT 49* 41 31 33 26  ALKPHOS 87 84 74 86 85  BILITOT 0.6 0.8 1.2 1.0 0.8  PROT 5.9* 5.6* 5.5* 5.7* 5.6*  ALBUMIN 1.7* 1.5* 1.4* 1.5* 1.5*   No results for input(s): LIPASE, AMYLASE in the last 168 hours. No results for input(s): AMMONIA in the last 168 hours. Coagulation Profile: No results for input(s): INR, PROTIME in the last 168 hours. Cardiac Enzymes: No results for input(s): CKTOTAL, CKMB, CKMBINDEX, TROPONINI in the last 168 hours. BNP (last 3 results) No results for input(s): PROBNP in the last 8760 hours. HbA1C: No results for input(s): HGBA1C in the last 72 hours. CBG: Recent Labs  Lab 01/25/19 1955 01/25/19 2338 01/26/19 0417 01/26/19 0713 01/26/19 1225  GLUCAP 215* 215* 216* 197* 196*   Lipid Profile: No results for input(s):  CHOL, HDL, LDLCALC, TRIG, CHOLHDL, LDLDIRECT in the last 72 hours. Thyroid Function Tests: No results for input(s): TSH, T4TOTAL, FREET4, T3FREE, THYROIDAB in the last 72 hours. Anemia Panel: Recent Labs    01/25/19 0410 01/26/19 0500  FERRITIN 1,409* 1,144*   Sepsis Labs: Recent Labs  Lab 01/23/19 1113 01/23/19 1322 01/24/19 0517 01/25/19 0410  PROCALCITON 2.24  --  1.88 1.46  LATICACIDVEN 1.0 0.9  --   --     Recent Results (from the past 240 hour(s))  Culture, blood (routine x 2)     Status: None (Preliminary result)   Collection Time: 01/23/19 11:00 AM   Specimen: BLOOD RIGHT HAND  Result Value Ref Range Status   Specimen Description BLOOD RIGHT HAND  Final   Special Requests   Final    BOTTLES DRAWN AEROBIC ONLY Blood Culture results may not be optimal due to an inadequate volume of blood received in culture bottles   Culture   Final    NO GROWTH 3 DAYS Performed at [facility], [place] [address] [place], [place] [address]    Report Status PENDING  Incomplete  Culture, blood (routine x 2)     Status: None (Preliminary result)  Collection Time: 01/23/19 11:11 AM   Specimen: BLOOD RIGHT HAND  Result Value Ref Range Status   Specimen Description BLOOD RIGHT HAND  Final   Special Requests   Final    BOTTLES DRAWN AEROBIC ONLY Blood Culture adequate volume   Culture   Final    NO GROWTH 3 DAYS Performed at [facility], [address], [place], [place] [address]    Report Status PENDING  Incomplete  MRSA PCR Screening     Status: Abnormal   Collection Time: 01/23/19 11:18 AM   Specimen: Nasal Mucosa; Nasopharyngeal  Result Value Ref Range Status   MRSA by PCR POSITIVE (Shaleen Talamantez) NEGATIVE Final    Comment:        The GeneXpert MRSA Assay (FDA approved for NASAL specimens only), is one component of [facility]uf[facility] Prindle comprehensive MRSA colonization surveillance program. It is not intended to diagnose MRSA infection nor to guide or monitor treatment for MRSA  infections. RESULT CALLED TO, READ BACK BY AND VERIFIED WITH: Dennison Bulla RN 15:20 01/23/19 ([email or web address]) Performed at [facility], [place] [address], [place], [place] [address]          Radiology Studies: No results found.      Scheduled Meds:  sodium chloride   Intravenous Once   amLODipine  10 mg Oral Daily   Chlorhexidine Gluconate Cloth  6 each Topical Q0600   Chlorhexidine Gluconate Cloth  6 each Topical Q0600   cloNIDine  0.2 mg Oral BID   darbepoetin (ARANESP) injection - NON-DIALYSIS  150 mcg Subcutaneous Q Wed-1800   feeding supplement (ENSURE ENLIVE)  237 mL Oral TID BM   folic acid  1 mg Oral Daily   free water  200 mL Per Tube Q8H   magnesium oxide  400 mg Oral BID WC   mouth rinse  15 mL Mouth Rinse BID   metoprolol tartrate  50 mg Oral BID   mupirocin ointment   Nasal BID   mycophenolate  500 mg Oral BID   pantoprazole  40 mg Oral BID   [START ON 02/04/2019] predniSONE  10 mg Oral Q breakfast   [START ON 02/01/2019] predniSONE  20 mg Oral Q breakfast   [START ON 01/29/2019] predniSONE  30 mg Oral Q breakfast   predniSONE  40 mg Oral Q breakfast   sodium chloride flush  3 mL Intravenous Q12H   sulfamethoxazole-trimethoprim  1 tablet Oral Q M,W,F   tacrolimus  4 mg Oral BID   vancomycin variable dose per unstable renal function (pharmacist dosing)   Does not apply See admin instructions   Continuous Infusions:  sodium chloride 10 mL/hr at 01/23/19 1900   sodium chloride     sodium chloride     ceFEPime (MAXIPIME) IV 1 g (01/25/19 1744)   feeding supplement (VITAL AF 1.2 CAL) 1,000 mL (01/25/19 1753)     LOS: 15 days    Time spent: over 30 min    Fayrene Helper, MD [facility] Pager AMION  If 7PM-7AM, please contact night-coverage [email or web address] Password [facility] 01/26/2019, 3:48 PM

## 2019-01-26 NOTE — Progress Notes (Addendum)
Nutrition Follow-up  DOCUMENTATION CODES:   Severe malnutrition in context of chronic illness, Underweight  INTERVENTION:   Paged by Dr. Florene Glen, requests TF to be decreased to help stimulate pt's appetite.   RD to switch feeds to night feed regimen to allow time off pump for mealtimes.  Transition to night feed regimen via Cortrak tube: -Initiate Vital AF 1.2 @ 65 ml/hr x 14 hours (1900-0900) -Provides 1092 kcals (~72% of needs), 68g protein (~91% of needs) and 738 ml H2O.  -Continue Ensure Enlive po TID, each supplement provides 350 kcal and 20 grams of protein -Will monitor for plan going forward  NUTRITION DIAGNOSIS:   Severe Malnutrition related to chronic illness(CKD) as evidenced by severe muscle depletion, severe fat depletion, percent weight loss.  Ongoing.  GOAL:   Patient will meet greater than or equal to 90% of their needs  Meeting with TF  MONITOR:   TF tolerance, Labs, PO intake, Skin, I & O's  REASON FOR ASSESSMENT:   Consult Assessment of nutrition requirement/status  ASSESSMENT:   Pt with PMH of HTN and stage V CKD s/p renal transplantation presenting with AMS.  He was admitted to [facility] from 10/28-11/3 for COVID-19-associated PNA with acute respiratory failure with hypoxia.  He completed a course of Remdesivir and steroids, and he was discharged home on 11/3, patient was brought back to ED, given failure to thrive, dehydration and altered mental status, he was noted to have significant dehydration with hypernatremia and renal failure, he was transferred to [facility] for further management. Previous admission for COVID-19+ 10/28-11/3.  11/8 -admitted 11/17 -Cortrak tube placed, TF initiated 11/19 -HD trial 11/20 -HD trial 11/21 -diet advanced to dysphagia 1   **RD working remotely**  Patient continues to receive Vital AF 1.2 @ 55 ml/hr via Cortrak tube and tolerating. SLP attempted to evaluate PO today but pt only agreed to sip on liquids. Pt  refused to eat any foods from tray stating that foods are hard to get down. Per documentation, pt only accepting 1 Ensure daily. Would continue TF until PO intakes improve. Palliative care consulted, per note from 11/20, if patient doesn't improve then recommend comfort care.  Per nephrology note, pt improving. Recommends pt resume HD long term. Expected to have HD today if pt agreeable.  Admission weight: 96 lbs. Current weight: 110 lbs.  I/Os: +14.1L since 11/9 UOP: 550 ml x 24 hrs  Medications: Folic acid tablet, MAG-OX tablet Labs reviewed: CBGs: 196-197  Diet Order:   Diet Order            DIET - DYS 1 Room service appropriate? Yes; Fluid consistency: Thin  Diet effective now              EDUCATION NEEDS:   Not appropriate for education at this time  Skin:  Skin Assessment: Reviewed RN Assessment  Last BM:  11/23 -type 7  Height:   Ht Readings from Last 1 Encounters:  01/12/19 5\' 5"  (1.651 m)    Weight:   Wt Readings from Last 1 Encounters:  01/26/19 50 kg    Ideal Body Weight:  61.8 kg  BMI:  Body mass index is 18.34 kg/m.  Estimated Nutritional Needs:   Kcal:  1500-1700  Protein:  75-90 grams  Fluid:  > 1.5 L/day  Clayton Bibles, MS, RD, LDN Inpatient Clinical Dietitian Pager: [phone] After Hours Pager: [phone]

## 2019-01-26 NOTE — Progress Notes (Signed)
Occupational Therapy Treatment Patient Details Name: Francisco Williamson MRN: [id number] DOB: 1945-12-27 Today's Date: 01/26/2019    History of present illness 73 yo male presenting with AMS and failure to thrive, renal failure.Pt was admitted to [facility] from 10/28-11/3 for COVID + and PNA. 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   OT comments  Pt seen for OT f/u session to progress BADL mobility. Pt with decreased interaction this session, fixated on getting a blanket and asking "why is this necessary". Pt requiring min A +2 for bed mobility and mod A to transfer to chair with RW. Pt fatigued after this amount of activity, with eyes closed and even less engagement than at start of session. Noted abdominal breathing, but O2 sats stable on RA. Goals updated to reflect progress. D/c recs remain appropriate. Will follow.   Follow Up Recommendations  SNF;Supervision/Assistance - 24 hour    Equipment Recommendations  3 in 1 bedside commode;Tub/shower seat    Recommendations for Other Services      Precautions / Restrictions Precautions Precautions: Fall Restrictions Weight Bearing Restrictions: No       Mobility Bed Mobility Overal bed mobility: Needs Assistance Bed Mobility: Supine to Sit     Supine to sit: HOB elevated;Min assist;+2 for physical assistance     General bed mobility comments: minA for moving LE off bed and initiation of transfer and bringing trunk to upright, and scooting hips to EOB  Transfers Overall transfer level: Needs assistance Equipment used: None Transfers: Sit to/from Stand Sit to Stand: Mod assist         General transfer comment: mod A to rise and steady    Balance Overall balance assessment: Needs assistance Sitting-balance support: Feet supported;No upper extremity supported Sitting balance-Leahy Scale: Fair     Standing balance support: Bilateral upper extremity supported Standing balance-Leahy Scale:  Poor Standing balance comment: requires B UE support and outside assist                           ADL either performed or assessed with clinical judgement   ADL Overall ADL's : Needs assistance/impaired Eating/Feeding: NPO Eating/Feeding Details (indicate cue type and reason): coretrak                     Toilet Transfer: Moderate assistance;Stand-pivot;BSC Toilet Transfer Details (indicate cue type and reason): simulated with recliner         Functional mobility during ADLs: Moderate assistance;+2 for safety/equipment General ADL Comments: Pt presenting with poor activity tolerance impacting his safety and performance of ADLs, seems to be increased weakness from previous session     Vision       Perception     Praxis      Cognition Arousal/Alertness: Lethargic Behavior During Therapy: Flat affect Overall Cognitive Status: Impaired/Different from baseline Area of Impairment: Memory;Problem solving;Awareness                     Memory: Decreased short-term memory     Awareness: Emergent Problem Solving: Slow processing;Requires verbal cues General Comments: pt slow to respond and process this date, minimally interactive. Increased time for all tasks        Exercises     Shoulder Instructions       General Comments VSS    Pertinent Vitals/ Pain       Pain Assessment: Faces Faces Pain Scale: Hurts a little bit  Pain Location: generalized Pain Descriptors / Indicators: Grimacing Pain Intervention(s): Limited activity within patient's tolerance;Monitored during session;Repositioned  Home Living                                          Prior Functioning/Environment              Frequency  Min 2X/week        Progress Toward Goals  OT Goals(current goals can now be found in the care plan section)  Progress towards OT goals: Not progressing toward goals - comment(decreased activity tolerance and  engagement this session)  Acute Rehab OT Goals Patient Stated Goal: get a warm blanket OT Goal Formulation: With patient Time For Goal Achievement: 02/09/19 Potential to Achieve Goals: Good  Plan Discharge plan remains appropriate;Frequency needs to be updated    Co-evaluation    PT/OT/SLP Co-Evaluation/Treatment: Yes Reason for Co-Treatment: Necessary to address cognition/behavior during functional activity;For patient/therapist safety;To address functional/ADL transfers PT goals addressed during session: Mobility/safety with mobility;Proper use of DME OT goals addressed during session: ADL's and self-care;Proper use of Adaptive equipment and DME      AM-PAC OT "6 Clicks" Daily Activity     Outcome Measure   Help from another person eating meals?: Total(coretrak) Help from another person taking care of personal grooming?: A Little Help from another person toileting, which includes using toliet, bedpan, or urinal?: A Little Help from another person bathing (including washing, rinsing, drying)?: A Little Help from another person to put on and taking off regular upper body clothing?: None Help from another person to put on and taking off regular lower body clothing?: A Little 6 Click Score: 17    End of Session Equipment Utilized During Treatment: Gait belt;Rolling walker  OT Visit Diagnosis: Unsteadiness on feet (R26.81);Other abnormalities of gait and mobility (R26.89);Muscle weakness (generalized) (M62.81);Other symptoms and signs involving cognitive function   Activity Tolerance Patient limited by fatigue   Patient Left in chair;with call bell/phone within reach;with chair alarm set   Nurse Communication Mobility status        Time: 1650-1720 OT Time Calculation (min): 30 min  Charges: OT General Charges $OT Visit: 1 Visit OT Treatments $Self Care/Home Management : 8-22 mins  Zenovia Jarred, MSOT, OTR/L [place] [facility] Office:  [place] 01/26/2019, 6:17 PM

## 2019-01-26 NOTE — Progress Notes (Signed)
Subjective:  Pt seen in room  Objective Vital signs in last 24 hours: Vitals:   01/26/19 1225 01/26/19 1255 01/26/19 1325 01/26/19 1355  BP: (!) 154/71 (!) 149/79 (!) 142/70 (!) (P) 141/71  Pulse: 74 67 78 (P) 81  Resp: (!) 24 (!) 22 (!) 24 (!) (P) 22  Temp:      TempSrc:      SpO2:      Weight:      Height:       Weight change: 1.8 kg  Intake/Output Summary (Last 24 hours) at 01/26/2019 1432 Last data filed at 01/26/2019 [id number] Gross per 24 hour  Intake 1265.94 ml  Output 550 ml  Net 715.94 ml  Physical Exam:   Pt in bed, hunched up and listless, responds to simple questions, looks tired, NG tube in   No jvd   Chest occ rales, mostly clear   Cor reg     Abd soft ntnd     Ext no leg or UE edema      L arm AVF+lines running       NF, gen'd weakness, responsive and oriented  Summary: Pt is a 73 y.o. yo male s/p renal transplant but with CKD at baseline crt in the 3's who was admitted on 01/11/2019 with weakness after COVID treatment   Assessment/Plan: 1. Renal-  History of fairly advanced CKD (crt 3's) and renal transplant.  A on chronic change due to illness and volume/free water depletion.  since not thriving we spok with family and the patient- we wanted to attempt a trial of HD to see if can clear MS more and improve outlook-  first short treatment 11/19-  Second shorter on 11/20 due to some instability.  This brought his BUN down to the 60's-  Is making urine.  However,his kidney is not providing clearance for him- BUN now up from 60 to 90-  It was struggling at baseline before COVID.  I really feel we need to re-initiate dialysis and it would probably be needed long term.  Family has told me to do what we need  to do--- patient has been resistant and due to covid restrictions I am not able to explain to him what he needs in the most effective manner.  No absolute need for HD today but will write orders for tomorrow and hope that the patient comes around.  If he refuses HD then  will likely need palliative care at that point. Plan HD today, 3rd HD. F/u labs and pt tomorrow.  2. S/p renal transplant-  Is on prograf and very low dose cellcept as well as decadron  - cont for now-   3. Anemia- is on procrit normally for anemia as OP-  Supportive therapy - giving ESA and given one unit of blood on 11/20 4.  Metabolic acidosis-  Was giving bicarb repletion - will stop now as is corrected with brief HD 5. HTN/volume-  Hypertensive.  continue with antihypertensivies 6.   Hypernatremia- corrected - backed off on free water repletion  7.  Hypokalemia-  Resolved 8.  Dispo-  Palliative care states if no improvement transition to comfort care  Kelly Splinter, MD 01/26/2019, 2:34 PM    Labs: Basic Metabolic Panel: Recent Labs  Lab 01/21/19 0235 01/22/19 0215 01/23/19 0336  01/24/19 0517 01/25/19 0410 01/26/19 0500  NA 139 140 139   < > 137 138 138  K 3.7 3.1* 2.7*   < > 4.1 4.6 3.8  CL 111  107 98  --  100 100 103  CO2 16* 18* 31  --  29 28 26   GLUCOSE 232* 163* 116*  --  117* 256* 237*  BUN 127* 136* 70*  --  65* 93* 109*  CREATININE 5.82* 5.64* 3.70*  --  3.38* 4.39* 4.51*  CALCIUM 7.6* 7.0* 7.0*  --  7.4* 7.8* 7.4*  PHOS 5.6* 5.1* 2.9  --   --   --   --    < > = values in this interval not displayed.   Liver Function Tests: Recent Labs  Lab 01/24/19 0517 01/25/19 0410 01/26/19 0500  AST 26 24 15   ALT 31 33 26  ALKPHOS 74 86 85  BILITOT 1.2 1.0 0.8  PROT 5.5* 5.7* 5.6*  ALBUMIN 1.4* 1.5* 1.5*   No results for input(s): LIPASE, AMYLASE in the last 168 hours. No results for input(s): AMMONIA in the last 168 hours. CBC: Recent Labs  Lab 01/22/19 0215  01/23/19 0336  01/24/19 0517 01/24/19 1935 01/25/19 0410 01/26/19 0500  WBC 8.2  --  8.6  --  8.3  --  9.6 9.0  NEUTROABS 6.6  --  6.0  --  7.6  --   --   --   HGB 7.5*   < > 7.1*   < > 7.9* 8.4* 7.8* 7.5*  HCT 22.6*   < > 21.2*   < > 23.1* 24.7* 22.8* 22.7*  MCV 85.9  --  83.8  --  83.1  --  85.1  86.0  PLT 117*  --  118*  --  146*  --  222 267   < > = values in this interval not displayed.   Cardiac Enzymes: No results for input(s): CKTOTAL, CKMB, CKMBINDEX, TROPONINI in the last 168 hours. CBG: Recent Labs  Lab 01/25/19 1955 01/25/19 2338 01/26/19 0417 01/26/19 0713 01/26/19 1225  GLUCAP 215* 215* 216* 197* 196*    Iron Studies:  Recent Labs    01/26/19 0500  FERRITIN 1,144*   Studies/Results: No results found. Medications: Infusions: . sodium chloride 10 mL/hr at 01/23/19 1900  . sodium chloride    . sodium chloride    . ceFEPime (MAXIPIME) IV 1 g (01/25/19 1744)  . feeding supplement (VITAL AF 1.2 CAL) 1,000 mL (01/25/19 1753)    Scheduled Medications: . sodium chloride   Intravenous Once  . amLODipine  10 mg Oral Daily  . Chlorhexidine Gluconate Cloth  6 each Topical Q0600  . Chlorhexidine Gluconate Cloth  6 each Topical Q0600  . cloNIDine  0.2 mg Oral BID  . darbepoetin (ARANESP) injection - NON-DIALYSIS  150 mcg Subcutaneous Q Wed-1800  . feeding supplement (ENSURE ENLIVE)  237 mL Oral TID BM  . folic acid  1 mg Oral Daily  . free water  200 mL Per Tube Q8H  . magnesium oxide  400 mg Oral BID WC  . mouth rinse  15 mL Mouth Rinse BID  . metoprolol tartrate  50 mg Oral BID  . mupirocin ointment   Nasal BID  . mycophenolate  500 mg Oral BID  . pantoprazole  40 mg Oral BID  . [START ON 02/04/2019] predniSONE  10 mg Oral Q breakfast  . [START ON 02/01/2019] predniSONE  20 mg Oral Q breakfast  . [START ON 01/29/2019] predniSONE  30 mg Oral Q breakfast  . predniSONE  40 mg Oral Q breakfast  . sodium chloride flush  3 mL Intravenous Q12H  . sulfamethoxazole-trimethoprim  1 tablet  Oral Q M,W,F  . tacrolimus  4 mg Oral BID  . vancomycin variable dose per unstable renal function (pharmacist dosing)   Does not apply See admin instructions    have reviewed scheduled and prn medications.    01/26/2019,2:32 PM  LOS: 15 days

## 2019-01-26 NOTE — Progress Notes (Signed)
  Speech Language Pathology Treatment: Dysphagia  Patient Details Name: Francisco Williamson MRN: [id number] DOB: [birth year] Today's Date: 01/26/2019 Time: [id number] SLP Time Calculation (min) (ACUTE ONLY): 14 min  Assessment / Plan / Recommendation Clinical Impression  Pt is alert but initially refusing POs, ultimately agreeable to sips of tea but taking very small amounts per sip. Trace to mild anterior spillage is observed but no overt s/s of aspiration. When asked why he's not eating he says its because the foods > liquids are "hard to get down" but cannot elaborate and will not try any POs for me to observe. Would continue current diet for now but will f/u for ongoing differential diagnosis.   HPI HPI: 73 yo male presenting with AMS and failure to thrive, renal failure.Pt was admitted to [facility] from 10/28-11/3 for COVID + and PNA. 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  Clinical swallow evaluation was completed on 11/19 - pt was communicative and swallow appeared to be [facility]; regular diet was recommended.  11/20 CXR with worsening bilateral pna; continued 02 requirement, fever and AMS. Swallow evaluation reordered.        SLP Plan  Continue with current plan of care       Recommendations  Diet recommendations: Dysphagia 1 (puree);Thin liquid Liquids provided via: Cup;Straw Medication Administration: Crushed with puree Supervision: Staff to assist with self feeding Compensations: Slow rate;Small sips/bites Postural Changes and/or Swallow Maneuvers: Seated upright 90 degrees                Oral Care Recommendations: Oral care QID Follow up Recommendations: None SLP Visit Diagnosis: Dysphagia, unspecified (R13.10) Plan: Continue with current plan of care       GO                Venita Sheffield Payeton Germani 01/26/2019, 12:46 PM  Pollyann Glen, M.A. [employer] Acute Environmental education officer [phone] Office [phone]

## 2019-01-26 NOTE — Progress Notes (Signed)
Physical Therapy Treatment Patient Details Name: Francisco Williamson MRN: [id number] DOB: [birth year] Today's Date: 01/26/2019    History of Present Illness 73 yo male presenting with AMS and failure to thrive, renal failure.Pt was admitted to [facility] from 10/28-11/3 for COVID + and PNA. 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    PT Comments    Pt agreeable to get up to chair with therapy, but is obviously fatigued after HD. Pt is limited in safe mobility by generalized weakness and decreased balance. Pt requires minAx2 for bed mobility, modA for sit>stand and heavy modA for ambulating 4 feet to recliner. Once in recliner pt with difficulty holding his head up. Pt provide with warm blankets and he perked up a little bit. D/c plans remain appropriate at this time. PT will continue to follow acutely.    Follow Up Recommendations  SNF     Equipment Recommendations  None recommended by PT       Precautions / Restrictions Precautions Precautions: Fall Restrictions Weight Bearing Restrictions: No    Mobility  Bed Mobility Overal bed mobility: Needs Assistance Bed Mobility: Supine to Sit     Supine to sit: HOB elevated;Min assist;+2 for physical assistance     General bed mobility comments: minA for moving LE off bed and bringing trunk to upright, and scooting hips to EoB  Transfers Overall transfer level: Needs assistance Equipment used: None Transfers: Sit to/from Stand Sit to Stand: Mod assist         General transfer comment: modA for powerup and steadying at [facility]  Ambulation/Gait Ambulation/Gait assistance: Mod assist Gait Distance (Feet): 4 Feet Assistive device: Rolling walker (2 wheeled) Gait Pattern/deviations: Step-through pattern;Decreased stride length Gait velocity: decr Gait velocity interpretation: <1.31 ft/sec, indicative of household ambulator General Gait Details: heavy modA for steadying with steps to recliner, pt with  obvious fatigue after transferring to recliner       Balance Overall balance assessment: Needs assistance Sitting-balance support: Feet supported;No upper extremity supported Sitting balance-Leahy Scale: Fair     Standing balance support: Bilateral upper extremity supported Standing balance-Leahy Scale: Poor Standing balance comment: requires B UE support and outside assist                            Cognition Arousal/Alertness: Lethargic Behavior During Therapy: Flat affect Overall Cognitive Status: Impaired/Different from baseline Area of Impairment: Memory;Problem solving;Awareness                     Memory: Decreased short-term memory     Awareness: Emergent Problem Solving: Slow processing;Requires verbal cues General Comments: pt with limited conversation, is slow to respond to questions, requiring increased time for movement       Exercises      General Comments General comments (skin integrity, edema, etc.): VSS      Pertinent Vitals/Pain Pain Assessment: Faces Faces Pain Scale: Hurts a little bit Pain Location: generalized Pain Descriptors / Indicators: Grimacing Pain Intervention(s): Limited activity within patient's tolerance;Monitored during session;Repositioned           PT Goals (current goals can now be found in the care plan section) Acute Rehab PT Goals PT Goal Formulation: With patient Time For Goal Achievement: 02/04/19 Potential to Achieve Goals: Fair Progress towards PT goals: Not progressing toward goals - comment(very fatigued, right after HD)    Frequency    Min 2X/week  PT Plan Current plan remains appropriate    Co-evaluation PT/OT/SLP Co-Evaluation/Treatment: Yes Reason for Co-Treatment: For patient/therapist safety;Complexity of the patient's impairments (multi-system involvement) PT goals addressed during session: Mobility/safety with mobility        AM-PAC PT "6 Clicks" Mobility   Outcome  Measure  Help needed turning from your back to your side while in a flat bed without using bedrails?: A Little Help needed moving from lying on your back to sitting on the side of a flat bed without using bedrails?: A Little Help needed moving to and from a bed to a chair (including a wheelchair)?: A Lot Help needed standing up from a chair using your arms (e.g., wheelchair or bedside chair)?: A Lot Help needed to walk in hospital room?: A Lot Help needed climbing 3-5 steps with a railing? : Total 6 Click Score: 13    End of Session   Activity Tolerance: Patient tolerated treatment well Patient left: in chair;with call bell/phone within reach;with chair alarm set Nurse Communication: Mobility status PT Visit Diagnosis: Difficulty in walking, not elsewhere classified (R26.2);Adult, failure to thrive (R62.7)     Time: 1650-1720 PT Time Calculation (min) (ACUTE ONLY): 30 min  Charges:  $Gait Training: 8-22 mins                     Okla Qazi B. Migdalia Dk PT, DPT Acute Rehabilitation Services Pager [phone] Office [phone]    [place] 01/26/2019, 5:41 PM

## 2019-01-26 NOTE — Progress Notes (Signed)
Pharmacy Antibiotic Note  Francisco Williamson is a 73 y.o. male admitted on 01/11/2019 with pneumonia.  Pharmacy has been consulted for Vancomycin and Cefepime dosing. Patient has CKD stage V (s/p renal transplantation).   CXR 11/20 with worsening bilateral pneumonia and increased oxygen requirements. Patient has fever (Tmax 100.6) and altered mental status. Pharmacy consulted to dose vancomycin and cefepime for HCAP. Pt is making urine, pt is ESRD on HD. Last HD 3 hr BFR @ 300. Pt has been intermittently refusing HD.   Plan: -Cefepime 1 g IV q24h -Monitor HD schedule + plan for vancomycin, would consider discontinuing vancomycin   Height: 5\' 5"  (165.1 cm) Weight: 111 lb 5.3 oz (50.5 kg) IBW/kg (Calculated) : 61.5  Temp (24hrs), Avg:97.9 F (36.6 C), Min:97.6 F (36.4 C), Max:98.1 F (36.7 C)  Recent Labs  Lab 01/22/19 0215 01/23/19 0336 01/23/19 1113 01/23/19 1322 01/24/19 0517 01/25/19 0410 01/26/19 0500  WBC 8.2 8.6  --   --  8.3 9.6 9.0  CREATININE 5.64* 3.70*  --   --  3.38* 4.39* 4.51*  LATICACIDVEN  --   --  1.0 0.9  --   --   --     Estimated Creatinine Clearance: 10.7 mL/min (A) (by C-G formula based on SCr of 4.51 mg/dL (H)).    No Known Allergies  Antimicrobials this admission: Vancomycin 11/20 >> Cefepime 11/20 >>  Dose adjustments this admission:  Microbiology results: 11/20 BCx:  11/20 UCx:  11/20 MRSA PCR:     Francisco Williamson 01/26/2019 10:04 AM

## 2019-01-27 ENCOUNTER — Encounter (HOSPITAL_COMMUNITY): Payer: Medicare Other

## 2019-01-27 DIAGNOSIS — R531 Weakness: Secondary | ICD-10-CM

## 2019-01-27 DIAGNOSIS — Z515 Encounter for palliative care: Secondary | ICD-10-CM

## 2019-01-27 DIAGNOSIS — R627 Adult failure to thrive: Secondary | ICD-10-CM

## 2019-01-27 LAB — COMPREHENSIVE METABOLIC PANEL
ALT: 22 U/L (ref 0–44)
AST: 17 U/L (ref 15–41)
Albumin: 1.7 g/dL — ABNORMAL LOW (ref 3.5–5.0)
Alkaline Phosphatase: 93 U/L (ref 38–126)
Anion gap: 9 (ref 5–15)
BUN: 64 mg/dL — ABNORMAL HIGH (ref 8–23)
CO2: 27 mmol/L (ref 22–32)
Calcium: 7.5 mg/dL — ABNORMAL LOW (ref 8.9–10.3)
Chloride: 101 mmol/L (ref 98–111)
Creatinine, Ser: 3.41 mg/dL — ABNORMAL HIGH (ref 0.61–1.24)
GFR calc Af Amer: 20 mL/min — ABNORMAL LOW (ref >60)
GFR calc non Af Amer: 17 mL/min — ABNORMAL LOW (ref >60)
Glucose, Bld: 172 mg/dL — ABNORMAL HIGH (ref 70–99)
Potassium: 4.3 mmol/L (ref 3.5–5.1)
Sodium: 137 mmol/L (ref 135–145)
Total Bilirubin: 0.5 mg/dL (ref 0.3–1.2)
Total Protein: 5.5 g/dL — ABNORMAL LOW (ref 6.5–8.1)

## 2019-01-27 LAB — D-DIMER, QUANTITATIVE: D-Dimer, Quant: 6.74 ug/mL-FEU — ABNORMAL HIGH (ref 0.00–0.50)

## 2019-01-27 LAB — GLUCOSE, CAPILLARY
Glucose-Capillary: 209 mg/dL — ABNORMAL HIGH (ref 70–99)
Glucose-Capillary: 172 mg/dL — ABNORMAL HIGH (ref 70–99)
Glucose-Capillary: 154 mg/dL — ABNORMAL HIGH (ref 70–99)
Glucose-Capillary: 208 mg/dL — ABNORMAL HIGH (ref 70–99)

## 2019-01-27 LAB — CBC
HCT: 23 % — ABNORMAL LOW (ref 39.0–52.0)
Hemoglobin: 7.5 g/dL — ABNORMAL LOW (ref 13.0–17.0)
MCH: 29 pg (ref 26.0–34.0)
MCHC: 32.6 g/dL (ref 30.0–36.0)
MCV: 88.8 fL (ref 80.0–100.0)
Platelets: 235 10*3/uL (ref 150–400)
RBC: 2.59 MIL/uL — ABNORMAL LOW (ref 4.22–5.81)
RDW: 16.3 % — ABNORMAL HIGH (ref 11.5–15.5)
WBC: 9.3 10*3/uL (ref 4.0–10.5)
nRBC: 0.4 % — ABNORMAL HIGH (ref 0.0–0.2)

## 2019-01-27 LAB — MAGNESIUM: Magnesium: 1.8 mg/dL (ref 1.7–2.4)

## 2019-01-27 LAB — FERRITIN: Ferritin: 1070 ng/mL — ABNORMAL HIGH (ref 24–336)

## 2019-01-27 LAB — C-REACTIVE PROTEIN: CRP: 2.5 mg/dL — ABNORMAL HIGH (ref <1.0)

## 2019-01-27 LAB — PHOSPHORUS: Phosphorus: 3.9 mg/dL (ref 2.5–4.6)

## 2019-01-27 MED ORDER — VANCOMYCIN HCL 500 MG IV SOLR
500.0000 mg | Freq: Once | INTRAVENOUS | Status: AC
  Administered 2019-01-27: 500 mg via INTRAVENOUS
  Filled 2019-01-27: qty 500

## 2019-01-27 MED ORDER — CHLORHEXIDINE GLUCONATE CLOTH 2 % EX PADS
6.0000 | MEDICATED_PAD | Freq: Every day | CUTANEOUS | Status: DC
  Administered 2019-01-28 – 2019-02-04 (×6): 6 via TOPICAL

## 2019-01-27 NOTE — Progress Notes (Signed)
PROGRESS NOTE    Francisco Williamson  [id number] DOB: 07-Jun-1945 DOA: 01/11/2019 PCP: Default, Provider, MD   Brief Narrative:  73 y.o.BM PMHx Essential HTN and CKD stage V (s/p renal transplantation), deconditioning,  Presenting with AMS. He was admitted to [facility] from 10/28-11/3 for COVID-19-associated PNA with acute respiratory failure with hypoxia. He completed Abhiram Criado course of Remdesivir and steroids, and he was discharged home on 11/3, patient was brought back to ED, given failure to thrive, dehydration, AMS, and renal failure.  He was treated for HCAP initially.  He worsened around 11/14 and was retreated with steroids and remdesivir and plasma.  [facility].  On 11/20 he decompensated again with AMS and respiratory distress and was restarted on steroids and antibiotics for HCAP.  He's now improved from Miesha Bachmann respiratory status, though he continues to fail to thrive, requiring tube feeds via cortrak.  Renal is following for dialysis.  Will need to continue to discuss with palliative care, patient and family regarding plan of care regarding potential rehab vs home with hospice.    Assessment & Plan:   Principal Problem:   Acute renal failure superimposed on chronic kidney disease ([place]) Active Problems:   CKD stage 5 secondary to hypertension ([place])   Pneumonia due to COVID-19 virus   Essential hypertension   History of renal transplant   Severe sepsis (HCC)   Sepsis secondary to UTI ([place])   HCAP (healthcare-associated pneumonia)   Bilateral pleural effusion   Acute hypernatremia   Goals of care, counseling/discussion   Palliative care by specialist   Protein-calorie malnutrition, severe  Goals of care:  Mr. Rathgeb is awake and alert at this time.  I believe he'd be able to participate in goals of care discussions.  I attempted to broach this subject with him today, but he was reserved and didn't clearly answer my questions.  I think Grover Woodfield  family discussion with patient + daughter and niece would be helpful to help elicit patient goals.  He was unclear to me today whether he'd want dialysis or [place] feeding tube long term, but these are questions I believe he'd be able to participate in Sarely Stracener discussion about.  When I asked him about going home with hospice, he seemed unsure.  If possible, I think Guneet Delpino family discussion with patient would be beneficial to help elicit patients goals and what would be acceptable from quality of life standpoint for him.  Have consulted ID to see if he may be able to come off precautions.      Recurrent Sepsis 2/2 Suspected HCAP?  HCAP/Covid 19 pneumonia  Acute Hypoxic Respiratory Failure - 11/20 with worsening resp status, lethargy - improved today 11/21 after abx and steroids started  - Continues to do well on RA - Worsening CXR on 11/20 concerning for HCAP vs related to COVID 19 infection, ? Aspiration with AMS - CT chest 11/21 with concern for multifocal pneumonia vs HF - Blood cx from 11/20 pending, follow UA/cx - procalcitonin 2.24, downtrending 1.88 11/21 and 1.46 11/22 - He completed remdesivir and steroids 10/28-11/3 - Repeat course of remdesivir and steroids 11/14 - 11/18.  Convalescent plasma 11/14. - Cefepime for HCAP 11/8 -11/14.  Vancomycin 11/9 and 11/11. - Restarted on vanc/cefepime for HCAP on 11/20.  Taper steroids.  - last fever 11/20 - MRSA PCR was positive - follow sputum cx - plan for 7 days abx (will deescalate as able)  COVID-19 Labs  Recent Labs  01/25/19 0410 01/26/19 0500 01/27/19 0611  DDIMER 3.80* 2.38* 6.74*  FERRITIN 1,409* 1,144* 1,070*  CRP 8.1* 4.4* 2.5*    Lab Results  Component Value Date   SARSCOV2NAA POSITIVE (Kristeena Meineke) 12/31/2018   Acute on CKD stage V/RENAL transplant patient (Cr = 3.5)  NAGMA  Hypernatremia - creatinine worsened since admission (baseline appears in 3-4 range - most recently ~3.58 on 11/3) - he received lasix/albumin earlier in course of  hospitalization - creatinine worsening today, 400 cc recorded UOP yesterday - Appreciate renal assistance -plan for HD on 11/23 - per renal, likely need dialysis long term - next dialysis tomorrow, then Saturday, then TTS per renal - AKI on CKD thought related to illness and volume/free water depletion.  -I/O, daily weights - Continue prograf and cellcept as well as bactrim ppx - bicarb and free water per renal  Failure to thrive -Patient unable to care for himself at home.  Discharged on 11/3 and readmitted on 11/8.  Patient now with worsening acute on CKD stage V.  Clear that if patient goes home instead of to [facility] is at high risk for complete renal failure of his transplanted kidney. -11/16 requested placement of CorTrak tube - Repeat SLP eval - started on dysphagia 1, thin liquids on 11/21 - will transition his tube feeds to nighttime feeds and off during the day, hopefully, he'll have an appetite during the day and be able to take more PO  Sepsis due to staph epidermidis UTI -Afebrile, negative leukocytosis.  With his renal function, the doses of vanc he received should have covered him  Bilateral pleural effusion/Hypoalbuminemia -See acute on CKD stage V  Essential HTN -Amlodipine 10 mg daily -Clonidine 0.2 mg BID -Hydralazine PRN -Labetalol PRN -11/13 increase Metoprolol 50 mg BID  GI bleed -11/10 PN notes small amount of blood with stool - Occult blood pending collection - this was positive - DVT ppx was held, but now restarted on 11/17 - hold DVT ppx  - Continue to follow H/H today (relatively stable), transfuse for <7 - hemoccult positive - GI not planning for colonoscopy - continue to monitor at this time -S/p 2 units pRBC -Transfuse for hemoglobin<7   Hypernatremia - improved, appreciate renal assistance -See renal failure  Hypokalemia -See renal failure - K per renal  Hypomagnesmia - PO mag, follow  Hyperglycemia  Hypoglycemia - resume tube feeds  -Sensitive SSI - A1c is 4.2  Diarrhea -Patient unaware of the BMs -Would also account for his hypokalemia. -Imodium -11/16 discontinue Sorbitol, bisacodyl, MiraLAX  DVT prophylaxis: SCD Code Status: DNR Family Communication: niece and daughter 11/24 Disposition Plan: pending    Consultants:   Renal   Procedures:   Cortrak placement on 11/17 Echo IMPRESSIONS    1. Left ventricular ejection fraction, by visual estimation, is 60 to 65%. The left ventricle has normal function. Left ventricular septal wall thickness was mildly increased. Mildly increased left ventricular posterior wall thickness. There is mildly  increased left ventricular hypertrophy.  2. Left ventricular diastolic parameters are consistent with Grade I diastolic dysfunction (impaired relaxation).  3. Global right ventricle has normal systolic function.The right ventricular size is normal. No increase in right ventricular wall thickness.  4. Left atrial size was severely dilated.  5. Right atrial size was mildly dilated.  6. The mitral valve is normal in structure. No evidence of mitral valve regurgitation. No evidence of mitral stenosis.  7. The tricuspid valve is normal in structure. Tricuspid valve regurgitation is mild.  8. The aortic valve is grossly normal. Aortic valve regurgitation is not visualized. No evidence of aortic valve sclerosis or stenosis.  9. The pulmonic valve was normal in structure. Pulmonic valve regurgitation is not visualized. 10. Mildly elevated pulmonary artery systolic pressure. 11. The inferior vena cava is normal in size with greater than 50% respiratory variability, suggesting right atrial pressure of 3 mmHg.  Antimicrobials:  Anti-infectives (From admission, onward)   Start     Dose/Rate Route Frequency Ordered Stop   01/27/19 0800  vancomycin (VANCOCIN) 500 mg in sodium chloride 0.9 % 100 mL IVPB     500 mg 100 mL/hr over 60 Minutes Intravenous  Once 01/27/19 0714 01/27/19  1140   01/24/19 1800  ceFEPIme (MAXIPIME) 1 g in sodium chloride 0.9 % 100 mL IVPB     1 g 200 mL/hr over 30 Minutes Intravenous Every 24 hours 01/23/19 1122 01/29/19 2359   01/23/19 1200  ceFEPIme (MAXIPIME) 1 g in sodium chloride 0.9 % 100 mL IVPB     1 g 200 mL/hr over 30 Minutes Intravenous  Once 01/23/19 1122 01/23/19 1452   01/23/19 1130  vancomycin (VANCOCIN) IVPB 1000 mg/200 mL premix     1,000 mg 200 mL/hr over 60 Minutes Intravenous  Once 01/23/19 1122 01/23/19 1623   01/23/19 1123  vancomycin variable dose per unstable renal function (pharmacist dosing)      Does not apply See admin instructions 01/23/19 1123 01/29/19 2359   01/17/19 1300  remdesivir 100 mg in sodium chloride 0.9 % 250 mL IVPB     100 mg 500 mL/hr over 30 Minutes Intravenous Daily 01/17/19 1144 01/21/19 1106   01/16/19 1800  vancomycin (VANCOCIN) 500 mg in sodium chloride 0.9 % 100 mL IVPB     500 mg 100 mL/hr over 60 Minutes Intravenous  Once 01/13/19 1758 01/16/19 2323   01/13/19 1900  vancomycin (VANCOCIN) IVPB 1000 mg/200 mL premix     1,000 mg 200 mL/hr over 60 Minutes Intravenous  Once 01/13/19 1758 01/13/19 2200   01/12/19 1800  ceFEPIme (MAXIPIME) 1 g in sodium chloride 0.9 % 100 mL IVPB     1 g 200 mL/hr over 30 Minutes Intravenous Every 24 hours 01/11/19 0910 01/17/19 1904   01/12/19 1600  sulfamethoxazole-trimethoprim (BACTRIM) 400-80 MG per tablet 1 tablet    Note to Pharmacy: Take one tablet by mouth on Monday, Wednesday and fridays.     1 tablet Oral Every M-W-F 01/12/19 1449     01/11/19 0830  vancomycin (VANCOCIN) IVPB 1000 mg/200 mL premix  Status:  Discontinued     1,000 mg 200 mL/hr over 60 Minutes Intravenous  Once 01/11/19 0825 01/11/19 0901   01/11/19 0830  ceFEPIme (MAXIPIME) 2 g in sodium chloride 0.9 % 100 mL IVPB     2 g 200 mL/hr over 30 Minutes Intravenous  Once 01/11/19 0825 01/11/19 0952     Subjective: Mariesha Venturella&O He's not hungry Denies complaints  Objective: Vitals:    01/27/19 0322 01/27/19 0700 01/27/19 1100 01/27/19 1556  BP: 136/64 (!) 165/64 (!) 181/76 (!) 162/71  Pulse:    76  Resp:   20 (!) 26  Temp: 97.8 F (36.6 C) (!) 97.2 F (36.2 C) 98 F (36.7 C) 97.6 F (36.4 C)  TempSrc: Oral Axillary Axillary Oral  SpO2:    98%  Weight: 51 kg     Height:        Intake/Output Summary (Last 24 hours) at 01/27/2019 2002 Last data filed  at 01/27/2019 1620 Gross per 24 hour  Intake 2748 ml  Output 550 ml  Net 2198 ml   Filed Weights   01/26/19 1200 01/26/19 1610 01/27/19 0322  Weight: 50 kg 50.3 kg 51 kg    Examination:  General: No acute distress. Cardiovascular: RRR Lungs: unlabored Abdomen: Soft, nontender, nondistended Neurological: Alert and oriented 3. Moves all extremities 4. Cranial nerves II through XII grossly intact. Skin: Warm and dry. No rashes or lesions. Extremities: No clubbing or cyanosis. No edema.   Data Reviewed: I have personally reviewed following labs and imaging studies  CBC: Recent Labs  Lab 01/21/19 0235  01/22/19 0215  01/23/19 0336  01/24/19 0517 01/24/19 1935 01/25/19 0410 01/26/19 0500 01/27/19 0611  WBC 7.7  --  8.2  --  8.6  --  8.3  --  9.6 9.0 9.3  NEUTROABS 6.7  --  6.6  --  6.0  --  7.6  --   --   --   --   HGB 8.0*   < > 7.5*   < > 7.1*   < > 7.9* 8.4* 7.8* 7.5* 7.5*  HCT 22.9*   < > 22.6*   < > 21.2*   < > 23.1* 24.7* 22.8* 22.7* 23.0*  MCV 83.0  --  85.9  --  83.8  --  83.1  --  85.1 86.0 88.8  PLT 119*  --  117*  --  118*  --  146*  --  222 267 235   < > = values in this interval not displayed.   Basic Metabolic Panel: Recent Labs  Lab 01/21/19 0235 01/22/19 0215 01/23/19 0336 01/23/19 1353 01/24/19 0517 01/25/19 0410 01/26/19 0500 01/27/19 0611  NA 139 140 139 138 137 138 138 137  K 3.7 3.1* 2.7* 3.2* 4.1 4.6 3.8 4.3  CL 111 107 98  --  100 100 103 101  CO2 16* 18* 31  --  29 28 26 27   GLUCOSE 232* 163* 116*  --  117* 256* 237* 172*  BUN 127* 136* 70*  --  65* 93* 109*  64*  CREATININE 5.82* 5.64* 3.70*  --  3.38* 4.39* 4.51* 3.41*  CALCIUM 7.6* 7.0* 7.0*  --  7.4* 7.8* 7.4* 7.5*  MG 1.7 1.6* 1.6*  --  1.7 1.9 2.0 1.8  PHOS 5.6* 5.1* 2.9  --   --   --   --  3.9   GFR: Estimated Creatinine Clearance: 14.3 mL/min (Camesha Farooq) (by C-G formula based on SCr of 3.41 mg/dL (H)). Liver Function Tests: Recent Labs  Lab 01/23/19 0336 01/24/19 0517 01/25/19 0410 01/26/19 0500 01/27/19 0611  AST 37 26 24 15 17   ALT 41 31 33 26 22  ALKPHOS 84 74 86 85 93  BILITOT 0.8 1.2 1.0 0.8 0.5  PROT 5.6* 5.5* 5.7* 5.6* 5.5*  ALBUMIN 1.5* 1.4* 1.5* 1.5* 1.7*   No results for input(s): LIPASE, AMYLASE in the last 168 hours. No results for input(s): AMMONIA in the last 168 hours. Coagulation Profile: No results for input(s): INR, PROTIME in the last 168 hours. Cardiac Enzymes: No results for input(s): CKTOTAL, CKMB, CKMBINDEX, TROPONINI in the last 168 hours. BNP (last 3 results) No results for input(s): PROBNP in the last 8760 hours. HbA1C: No results for input(s): HGBA1C in the last 72 hours. CBG: Recent Labs  Lab 01/26/19 2307 01/27/19 0311 01/27/19 0902 01/27/19 1138 01/27/19 1555  GLUCAP 222* 209* 172* 154* 208*   Lipid Profile: No results  for input(s): CHOL, HDL, LDLCALC, TRIG, CHOLHDL, LDLDIRECT in the last 72 hours. Thyroid Function Tests: No results for input(s): TSH, T4TOTAL, FREET4, T3FREE, THYROIDAB in the last 72 hours. Anemia Panel: Recent Labs    01/26/19 0500 01/27/19 0611  FERRITIN 1,144* 1,070*   Sepsis Labs: Recent Labs  Lab 01/23/19 1113 01/23/19 1322 01/24/19 0517 01/25/19 0410  PROCALCITON 2.24  --  1.88 1.46  LATICACIDVEN 1.0 0.9  --   --     Recent Results (from the past 240 hour(s))  Culture, blood (routine x 2)     Status: None (Preliminary result)   Collection Time: 01/23/19 11:00 AM   Specimen: BLOOD RIGHT HAND  Result Value Ref Range Status   Specimen Description BLOOD RIGHT HAND  Final   Special Requests   Final     BOTTLES DRAWN AEROBIC ONLY Blood Culture results may not be optimal due to an inadequate volume of blood received in culture bottles   Culture   Final    NO GROWTH 4 DAYS Performed at [facility], [place] [address], [place], [place] [address]    Report Status PENDING  Incomplete  Culture, blood (routine x 2)     Status: None (Preliminary result)   Collection Time: 01/23/19 11:11 AM   Specimen: BLOOD RIGHT HAND  Result Value Ref Range Status   Specimen Description BLOOD RIGHT HAND  Final   Special Requests   Final    BOTTLES DRAWN AEROBIC ONLY Blood Culture adequate volume   Culture   Final    NO GROWTH 4 DAYS Performed at [facility], [place] [address] [place], [place] [address]    Report Status PENDING  Incomplete  MRSA PCR Screening     Status: Abnormal   Collection Time: 01/23/19 11:18 AM   Specimen: Nasal Mucosa; Nasopharyngeal  Result Value Ref Range Status   MRSA by PCR POSITIVE (Martese Vanatta) NEGATIVE Final    Comment:        The GeneXpert MRSA Assay (FDA approved for NASAL specimens only), is one component of Just[facility] Baby comprehensive MRSA colonization surveillance program. It is not intended to diagnose MRSA infection nor to guide or monitor treatment for MRSA infections. RESULT CALLED TO, READ BACK BY AND VERIFIED WITH: Dennison Bulla RN 15:20 01/23/19 ([email or web address]) Performed at [facility], [place] [address] [place],  [address]          Radiology Studies: No results found.      Scheduled Meds: . sodium chloride   Intravenous Once  . amLODipine  10 mg Oral Daily  . Chlorhexidine Gluconate Cloth  6 each Topical Q0600  . Chlorhexidine Gluconate Cloth  6 each Topical Q0600  . [START ON 01/28/2019] Chlorhexidine Gluconate Cloth  6 each Topical Q0600  . cloNIDine  0.2 mg Oral BID  . darbepoetin (ARANESP) injection - NON-DIALYSIS  150 mcg Subcutaneous Q Wed-1800  . feeding supplement (ENSURE ENLIVE)  237 mL Oral TID BM  . feeding supplement (VITAL AF 1.2 CAL)   1,000 mL Per Tube Q24H  . folic acid  1 mg Oral Daily  . free water  200 mL Per Tube Q8H  . magnesium oxide  400 mg Oral BID WC  . mouth rinse  15 mL Mouth Rinse BID  . metoprolol tartrate  50 mg Oral BID  . mupirocin ointment   Nasal BID  . mycophenolate  500 mg Oral BID  . pantoprazole  40 mg Oral BID  . [START ON 02/04/2019]  predniSONE  10 mg Oral Q breakfast  . [START ON 02/01/2019] predniSONE  20 mg Oral Q breakfast  . [START ON 01/29/2019] predniSONE  30 mg Oral Q breakfast  . predniSONE  40 mg Oral Q breakfast  . sodium chloride flush  3 mL Intravenous Q12H  . sulfamethoxazole-trimethoprim  1 tablet Oral Q M,W,F  . tacrolimus  4 mg Oral BID  . vancomycin variable dose per unstable renal function (pharmacist dosing)   Does not apply See admin instructions   Continuous Infusions: . sodium chloride 10 mL/hr at 01/23/19 1900  . sodium chloride    . sodium chloride    . ceFEPime (MAXIPIME) IV 1 g (01/27/19 1859)     LOS: 16 days    Time spent: over 76 min    Fayrene Helper, MD [facility] Pager AMION  If 7PM-7AM, please contact night-coverage [email or web address] Password [email or web address] 01/27/2019, 8:02 PM

## 2019-01-27 NOTE — Progress Notes (Addendum)
Subjective:   Patient not examined today directly given COVID-19 + status, utilizing exam of the primary team and observations of RN's.    Objective Vital signs in last 24 hours: Vitals:   01/27/19 0000 01/27/19 0322 01/27/19 0700 01/27/19 1100  BP: (!) 141/45 136/64 (!) 165/64 (!) 181/76  Pulse:      Resp:    20  Temp: 97.7 F (36.5 C) 97.8 F (36.6 C) (!) 97.2 F (36.2 C) 98 F (36.7 C)  TempSrc: Axillary Oral Axillary Axillary  SpO2:      Weight:  51 kg    Height:       Weight change: -0.5 kg  Intake/Output Summary (Last 24 hours) at 01/27/2019 1524 Last data filed at 01/27/2019 1040 Gross per 24 hour  Intake 2548 ml  Output -204 ml  Net 2752 ml  Physical Exam:    Patient not examined today directly given COVID-19 + status, utilizing exam of the primary team and observations of RN's.    Summary: Pt is a 73 y.o. yo male s/p renal transplant but with CKD at baseline crt in the 3's who was admitted on 01/11/2019 with weakness after COVID treatment   Assessment/Plan: 1. Renal-  History of fairly advanced CKD (crt 3's) and renal transplant.  A on chronic change due to illness and volume/free water depletion.  Since not thriving we spoke with family and the patient and a decision was made for a trial of HD to see if can clear MS and improve outlook.   If he refuses HD then will likely need palliative care. So far has had HD here on 11/19, 11/20 and 11/23.  Labs better B/Cr 142/ 5.78 >> 64/3.1 today.  Plan next HD tomorrow then Saturday, then TTS while here.   2. S/p renal transplant-  Is on prograf and very low dose cellcept as well as decadron  - cont for now-   3. Anemia- is on procrit normally for anemia as OP-  Supportive therapy - giving ESA and given one unit of blood on 11/20 4.  Metabolic acidosis-  Resolved w/ HD 5. HTN/volume-  Hypertensive on 2-3 bp meds, wt's and BP's are up, will ^ UF w HD 6.   Hypernatremia- resolved 7.  Hypokalemia-  Resolved  Kelly Splinter,  MD 01/27/2019, 3:24 PM    Labs: Basic Metabolic Panel: Recent Labs  Lab 01/22/19 0215 01/23/19 0336  01/25/19 0410 01/26/19 0500 01/27/19 0611  NA 140 139   < > 138 138 137  K 3.1* 2.7*   < > 4.6 3.8 4.3  CL 107 98   < > 100 103 101  CO2 18* 31   < > 28 26 27   GLUCOSE 163* 116*   < > 256* 237* 172*  BUN 136* 70*   < > 93* 109* 64*  CREATININE 5.64* 3.70*   < > 4.39* 4.51* 3.41*  CALCIUM 7.0* 7.0*   < > 7.8* 7.4* 7.5*  PHOS 5.1* 2.9  --   --   --  3.9   < > = values in this interval not displayed.   Liver Function Tests: Recent Labs  Lab 01/25/19 0410 01/26/19 0500 01/27/19 0611  AST 24 15 17   ALT 33 26 22  ALKPHOS 86 85 93  BILITOT 1.0 0.8 0.5  PROT 5.7* 5.6* 5.5*  ALBUMIN 1.5* 1.5* 1.7*   No results for input(s): LIPASE, AMYLASE in the last 168 hours. No results for input(s): AMMONIA in the last 168  hours. CBC: Recent Labs  Lab 01/22/19 0215  01/23/19 0336  01/24/19 0517  01/25/19 0410 01/26/19 0500 01/27/19 0611  WBC 8.2  --  8.6  --  8.3  --  9.6 9.0 9.3  NEUTROABS 6.6  --  6.0  --  7.6  --   --   --   --   HGB 7.5*   < > 7.1*   < > 7.9*   < > 7.8* 7.5* 7.5*  HCT 22.6*   < > 21.2*   < > 23.1*   < > 22.8* 22.7* 23.0*  MCV 85.9  --  83.8  --  83.1  --  85.1 86.0 88.8  PLT 117*  --  118*  --  146*  --  222 267 235   < > = values in this interval not displayed.   Cardiac Enzymes: No results for input(s): CKTOTAL, CKMB, CKMBINDEX, TROPONINI in the last 168 hours. CBG: Recent Labs  Lab 01/26/19 1919 01/26/19 2307 01/27/19 0311 01/27/19 0902 01/27/19 1138  GLUCAP 210* 222* 209* 172* 154*    Iron Studies:  Recent Labs    01/27/19 0611  FERRITIN 1,070*   Studies/Results: No results found. Medications: Infusions: . sodium chloride 10 mL/hr at 01/23/19 1900  . sodium chloride    . sodium chloride    . ceFEPime (MAXIPIME) IV Stopped (01/26/19 1930)    Scheduled Medications: . sodium chloride   Intravenous Once  . amLODipine  10 mg Oral  Daily  . Chlorhexidine Gluconate Cloth  6 each Topical Q0600  . Chlorhexidine Gluconate Cloth  6 each Topical Q0600  . cloNIDine  0.2 mg Oral BID  . darbepoetin (ARANESP) injection - NON-DIALYSIS  150 mcg Subcutaneous Q Wed-1800  . feeding supplement (ENSURE ENLIVE)  237 mL Oral TID BM  . feeding supplement (VITAL AF 1.2 CAL)  1,000 mL Per Tube Q24H  . folic acid  1 mg Oral Daily  . free water  200 mL Per Tube Q8H  . magnesium oxide  400 mg Oral BID WC  . mouth rinse  15 mL Mouth Rinse BID  . metoprolol tartrate  50 mg Oral BID  . mupirocin ointment   Nasal BID  . mycophenolate  500 mg Oral BID  . pantoprazole  40 mg Oral BID  . [START ON 02/04/2019] predniSONE  10 mg Oral Q breakfast  . [START ON 02/01/2019] predniSONE  20 mg Oral Q breakfast  . [START ON 01/29/2019] predniSONE  30 mg Oral Q breakfast  . predniSONE  40 mg Oral Q breakfast  . sodium chloride flush  3 mL Intravenous Q12H  . sulfamethoxazole-trimethoprim  1 tablet Oral Q M,W,F  . tacrolimus  4 mg Oral BID  . vancomycin variable dose per unstable renal function (pharmacist dosing)   Does not apply See admin instructions    have reviewed scheduled and prn medications.    01/27/2019,3:24 PM  LOS: 16 days

## 2019-01-27 NOTE — Progress Notes (Signed)
Patient ID: Francisco Williamson, male   DOB: 1945/10/16, 73 y.o.   MRN: [id number]  This NP visited patient at the bedside as a follow up for palliative medicine needs and emotional support.  This patient was seen in consult by the palliative medicine team on 01-19-19 by provider Georgiann Hahn NP   Thorough chart review and discussion with necessary members of the care team was completed as part of assessment.  All issues were discussed and addressed but no physical exam was performed.  The above conversation was completed via telephone due to the COVID-19 pandemic.  I spoke by telephone with patient's daughter Francisco Williamson and niece Lyanne Co for continued conversation regarding current medical situation.  Patient continues to have very poor po intake, he is weak and lethargic and is failing to thrive.  Today is day 16 of this hospital stay.  He was previously admitted to [facility] from 10 28-11 3 for COVID-19 associated pneumonia with acute respiratory failure and hypoxia.  Today both his daughter and niece verbalized an understanding of the seriousness of the patient's current medical situation and the likely long-term poor prognosis.  Ultimately they would like for him to be able to come home, be cared for at home at this perceived time at end of life.  Unfortunately the patient was unable to clearly verbalize his wishes; significant decisions are pending,  Specifically artifical feeding, continued dialysis.  Will f/u tomorrow and possible have video conversation with patient and his family.  Discussed with family  the importance of continued conversation with the  medical providers regarding overall plan of care and treatment options,  ensuring decisions are within the context of the patients values and GOCs.  Questions and concerns addressed   Discussed with Dr Florene Glen  and bedside RN  Total time spent on the unit was 45 minutes  Greater than 50% of the time was spent in counseling and  coordination of care  Wadie Lessen NP  Palliative Medicine Team Team Phone # [phone] Pager [phone]

## 2019-01-27 NOTE — Progress Notes (Signed)
ID PROGRESS NOTE  73yo M originally diagnosed with covid 19 on 10/23, admitted to [facility] received remdesivir/steroids for treatment and discharged on 11/3. AT this this his isolation should have been through 11/17 (21d). However, he was rehospitalized on 11/8 for FTT and AKI. He had episodes of respiratory failure, requiring intubation and treatment for HCAP. Decision was made to retreat with remdesivir/steroids .   He is technically out of his isolation period from his original covid admission, however, I am not sure if the retreatment with remdesivir/steroids, has had impact on viable virus  Continue on airborne/contact for now, we will retest with cepheid test (to check his CT value) - if value is greater than >32 than this is likely incompetent viral particles/not infectious -then he may be removed from [place]. [place] for Infectious Diseases [phone]

## 2019-01-27 NOTE — Progress Notes (Signed)
Pt swallowed fine earlier today, now he is allowing fluids to dribble out the front of his mouth, similar to what slp said yesterday.

## 2019-01-28 DIAGNOSIS — E43 Unspecified severe protein-calorie malnutrition: Secondary | ICD-10-CM

## 2019-01-28 DIAGNOSIS — R627 Adult failure to thrive: Secondary | ICD-10-CM

## 2019-01-28 DIAGNOSIS — R531 Weakness: Secondary | ICD-10-CM

## 2019-01-28 LAB — CULTURE, BLOOD (ROUTINE X 2)
Culture: NO GROWTH
Culture: NO GROWTH
Special Requests: ADEQUATE

## 2019-01-28 LAB — CBC WITH DIFFERENTIAL/PLATELET
Abs Immature Granulocytes: 0.15 10*3/uL — ABNORMAL HIGH (ref 0.00–0.07)
Basophils Absolute: 0 10*3/uL (ref 0.0–0.1)
Basophils Relative: 0 %
Eosinophils Absolute: 0 10*3/uL (ref 0.0–0.5)
Eosinophils Relative: 0 %
HCT: 22.1 % — ABNORMAL LOW (ref 39.0–52.0)
Hemoglobin: 7 g/dL — ABNORMAL LOW (ref 13.0–17.0)
Immature Granulocytes: 2 %
Lymphocytes Relative: 11 %
Lymphs Abs: 0.8 10*3/uL (ref 0.7–4.0)
MCH: 28.7 pg (ref 26.0–34.0)
MCHC: 31.7 g/dL (ref 30.0–36.0)
MCV: 90.6 fL (ref 80.0–100.0)
Monocytes Absolute: 0.7 10*3/uL (ref 0.1–1.0)
Monocytes Relative: 8 %
Neutro Abs: 6.3 10*3/uL (ref 1.7–7.7)
Neutrophils Relative %: 79 %
Platelets: 260 10*3/uL (ref 150–400)
RBC: 2.44 MIL/uL — ABNORMAL LOW (ref 4.22–5.81)
RDW: 16.5 % — ABNORMAL HIGH (ref 11.5–15.5)
WBC: 7.9 10*3/uL (ref 4.0–10.5)
nRBC: 0.6 % — ABNORMAL HIGH (ref 0.0–0.2)

## 2019-01-28 LAB — COMPREHENSIVE METABOLIC PANEL
ALT: 19 U/L (ref 0–44)
AST: 13 U/L — ABNORMAL LOW (ref 15–41)
Albumin: 1.6 g/dL — ABNORMAL LOW (ref 3.5–5.0)
Alkaline Phosphatase: 89 U/L (ref 38–126)
Anion gap: 9 (ref 5–15)
BUN: 77 mg/dL — ABNORMAL HIGH (ref 8–23)
CO2: 26 mmol/L (ref 22–32)
Calcium: 7.3 mg/dL — ABNORMAL LOW (ref 8.9–10.3)
Chloride: 101 mmol/L (ref 98–111)
Creatinine, Ser: 3.73 mg/dL — ABNORMAL HIGH (ref 0.61–1.24)
GFR calc Af Amer: 18 mL/min — ABNORMAL LOW (ref >60)
GFR calc non Af Amer: 15 mL/min — ABNORMAL LOW (ref >60)
Glucose, Bld: 252 mg/dL — ABNORMAL HIGH (ref 70–99)
Potassium: 4.7 mmol/L (ref 3.5–5.1)
Sodium: 136 mmol/L (ref 135–145)
Total Bilirubin: 0.7 mg/dL (ref 0.3–1.2)
Total Protein: 5.6 g/dL — ABNORMAL LOW (ref 6.5–8.1)

## 2019-01-28 LAB — GLUCOSE, CAPILLARY
Glucose-Capillary: 176 mg/dL — ABNORMAL HIGH (ref 70–99)
Glucose-Capillary: 222 mg/dL — ABNORMAL HIGH (ref 70–99)
Glucose-Capillary: 226 mg/dL — ABNORMAL HIGH (ref 70–99)
Glucose-Capillary: 227 mg/dL — ABNORMAL HIGH (ref 70–99)
Glucose-Capillary: 247 mg/dL — ABNORMAL HIGH (ref 70–99)
Glucose-Capillary: 255 mg/dL — ABNORMAL HIGH (ref 70–99)

## 2019-01-28 LAB — MAGNESIUM: Magnesium: 1.8 mg/dL (ref 1.7–2.4)

## 2019-01-28 LAB — PHOSPHORUS: Phosphorus: 4.3 mg/dL (ref 2.5–4.6)

## 2019-01-28 LAB — C-REACTIVE PROTEIN: CRP: 1.6 mg/dL — ABNORMAL HIGH (ref <1.0)

## 2019-01-28 LAB — FERRITIN: Ferritin: 808 ng/mL — ABNORMAL HIGH (ref 24–336)

## 2019-01-28 LAB — D-DIMER, QUANTITATIVE: D-Dimer, Quant: 3.14 ug/mL-FEU — ABNORMAL HIGH (ref 0.00–0.50)

## 2019-01-28 LAB — SARS CORONAVIRUS 2 BY RT PCR (HOSPITAL ORDER, PERFORMED IN ~~LOC~~ HOSPITAL LAB): SARS Coronavirus 2: POSITIVE — AB

## 2019-01-28 MED ORDER — VANCOMYCIN HCL IN DEXTROSE 500-5 MG/100ML-% IV SOLN
INTRAVENOUS | Status: AC
  Filled 2019-01-28: qty 100

## 2019-01-28 MED ORDER — VANCOMYCIN HCL IN DEXTROSE 500-5 MG/100ML-% IV SOLN
500.0000 mg | INTRAVENOUS | Status: AC
  Administered 2019-01-28: 500 mg via INTRAVENOUS
  Filled 2019-01-28: qty 100

## 2019-01-28 MED ORDER — HEPARIN SODIUM (PORCINE) 1000 UNIT/ML IJ SOLN
INTRAMUSCULAR | Status: AC
  Filled 2019-01-28: qty 4

## 2019-01-28 MED ORDER — DARBEPOETIN ALFA 150 MCG/0.3ML IJ SOSY
PREFILLED_SYRINGE | INTRAMUSCULAR | Status: AC
  Filled 2019-01-28: qty 0.3

## 2019-01-28 MED ORDER — MYCOPHENOLATE MOFETIL HCL 500 MG IV SOLR
500.0000 mg | Freq: Two times a day (BID) | INTRAVENOUS | Status: DC
  Administered 2019-01-28 – 2019-02-02 (×11): 500 mg via INTRAVENOUS
  Filled 2019-01-28 (×14): qty 15

## 2019-01-28 NOTE — Progress Notes (Addendum)
[facility]  PROGRESS NOTE  Francisco Williamson [id number] DOB: Aug 16, 1945 DOA: 01/11/2019 PCP: Default, Provider, MD   Brief HPI:   73 year old male with a history of hypertension, CKDstageV status post renal transplantation, presented with altered mental status.  He was admitted to [facility] from 12/30/2020 11-24 COVID-19 associated pneumonia with acute respiratory failure with hypoxia.  He completed course of remdesivir and steroids and was discharged home on 01/06/2019.  He was brought back to ED given failure to thrive, dehydration, altered mental status and renal failure.  He was initially treated for HCAP.  Worsened around 01/17/2019 and was treated with steroids and remdesivir and plasma.  He was transferred to [facility] for intermittent hemodialysis.  On 01/23/2019 he decompensated again noted mental status and respiratory distress and was restarted on steroids and antibiotics for HCAP.  Patient has improved from respiratory status but continues to have failure to thrive.  Requiring tube feedings with cortrak.  Nephrology is following for dialysis.  Palliative care was consulted for goals of care.    Subjective   Patient seen and examined, denies chest pain or shortness of breath.   Assessment/Plan:     1. Acute hypoxic respiratory failure-recurrent sepsis due to HCAP/COVID-19 pneumonia.  Improved.  O2 sats 96- 100% on room air.  Patient was treated with cefepime and vancomycin for 7 days.  Received steroids and remdesivir twice, convalescent plasma 01/17/2019.  2. Acute renal injury on CKD stage V-patient has history of renal transplant, baseline appears 3-4 range.  Nephrology following, plan for hemodialysis on 1123 for renal.  Continue Prograf, CellCept, Bactrim prophylaxis.  3. Failure to thrive-patient is unable to take care of himself at home.  Discharged on 01/06/2019 and readmitted on 01/11/2019.  Now presenting with worsening renal function.  Cortrak Feeding tube placed.   Speech therapy evaluation obtained, started on dysphagia 1 thin liquid diet on 01/24/2019.  4. Staph epidermidis UTI-treated with vancomycin.  5. Hypertension-blood pressure stable, continue Modicon, clonidine, as needed labetalol, hydralazine.  6. GI bleed-FOBT was positive DVT prophylaxis was held.  GI is not planning for colonoscopy.  Patient received 2 units PRBC.  Transfuse for hemoglobin less than 7.  7. Goals of care-Palliative  care was consulted for goals of care.  We will try to assess in a.m. if patient can participate in goals of care discussion.     CBG: Recent Labs  Lab 01/27/19 1912 01/27/19 2257 01/28/19 0330 01/28/19 0727 01/28/19 1122  GLUCAP 227* 255* 247* 226* 176*    CBC: Recent Labs  Lab 01/22/19 0215  01/23/19 0336  01/24/19 0517 01/24/19 1935 01/25/19 0410 01/26/19 0500 01/27/19 0611 01/28/19 0415  WBC 8.2  --  8.6  --  8.3  --  9.6 9.0 9.3 7.9  NEUTROABS 6.6  --  6.0  --  7.6  --   --   --   --  6.3  HGB 7.5*   < > 7.1*   < > 7.9* 8.4* 7.8* 7.5* 7.5* 7.0*  HCT 22.6*   < > 21.2*   < > 23.1* 24.7* 22.8* 22.7* 23.0* 22.1*  MCV 85.9  --  83.8  --  83.1  --  85.1 86.0 88.8 90.6  PLT 117*  --  118*  --  146*  --  222 267 235 260   < > = values in this interval not displayed.    Basic Metabolic Panel: Recent Labs  Lab 01/22/19 0215 01/23/19 0336  01/24/19 0517 01/25/19 0410 01/26/19  0500 01/27/19 0611 01/28/19 0415  NA 140 139   < > 137 138 138 137 136  K 3.1* 2.7*   < > 4.1 4.6 3.8 4.3 4.7  CL 107 98  --  100 100 103 101 101  CO2 18* 31  --  29 28 26 27 26   GLUCOSE 163* 116*  --  117* 256* 237* 172* 252*  BUN 136* 70*  --  65* 93* 109* 64* 77*  CREATININE 5.64* 3.70*  --  3.38* 4.39* 4.51* 3.41* 3.73*  CALCIUM 7.0* 7.0*  --  7.4* 7.8* 7.4* 7.5* 7.3*  MG 1.6* 1.6*  --  1.7 1.9 2.0 1.8 1.8  PHOS 5.1* 2.9  --   --   --   --  3.9 4.3   < > = values in this interval not displayed.    COVID-19 Labs  Recent Labs    01/26/19 0500  01/27/19 0611 01/28/19 0415  DDIMER 2.38* 6.74* 3.14*  FERRITIN 1,144* 1,070* 808*  CRP 4.4* 2.5* 1.6*    Lab Results  Component Value Date   SARSCOV2NAA POSITIVE (A) 12/31/2018     DVT prophylaxis: SCDs  Code Status: DNR  Family Communication: No family at bedside  Disposition Plan: likely home when medically ready for discharge        BMI  Estimated body mass index is 17.98 kg/m as calculated from the following:   Height as of this encounter: 5\' 5"  (1.651 m).   Weight as of this encounter: 49 kg.  Scheduled medications:  . sodium chloride   Intravenous Once  . amLODipine  10 mg Oral Daily  . Chlorhexidine Gluconate Cloth  6 each Topical Q0600  . Chlorhexidine Gluconate Cloth  6 each Topical Q0600  . Chlorhexidine Gluconate Cloth  6 each Topical Q0600  . cloNIDine  0.2 mg Oral BID  . darbepoetin (ARANESP) injection - NON-DIALYSIS  150 mcg Subcutaneous Q Wed-1800  . feeding supplement (ENSURE ENLIVE)  237 mL Oral TID BM  . feeding supplement (VITAL AF 1.2 CAL)  1,000 mL Per Tube Q24H  . folic acid  1 mg Oral Daily  . free water  200 mL Per Tube Q8H  . magnesium oxide  400 mg Oral BID WC  . mouth rinse  15 mL Mouth Rinse BID  . metoprolol tartrate  50 mg Oral BID  . mupirocin ointment   Nasal BID  . pantoprazole  40 mg Oral BID  . [START ON 02/04/2019] predniSONE  10 mg Oral Q breakfast  . [START ON 02/01/2019] predniSONE  20 mg Oral Q breakfast  . [START ON 01/29/2019] predniSONE  30 mg Oral Q breakfast  . sodium chloride flush  3 mL Intravenous Q12H  . sulfamethoxazole-trimethoprim  1 tablet Oral Q M,W,F  . tacrolimus  4 mg Oral BID    Consultants:  Nephrology  Procedures:  Cortrak placement on 11/17   Antibiotics:   Anti-infectives (From admission, onward)   Start     Dose/Rate Route Frequency Ordered Stop   01/28/19 1347  vancomycin (VANCOCIN) 500-5 MG/100ML-% IVPB    Note to Pharmacy: Cherylann Banas   : cabinet override      01/28/19 1347  01/28/19 1615   01/28/19 1200  vancomycin (VANCOCIN) IVPB 500 mg/100 ml premix     500 mg 100 mL/hr over 60 Minutes Intravenous Every Wed (Hemodialysis) 01/28/19 1034 01/28/19 1713   01/27/19 0800  vancomycin (VANCOCIN) 500 mg in sodium chloride 0.9 % 100 mL IVPB  500 mg 100 mL/hr over 60 Minutes Intravenous  Once 01/27/19 0714 01/27/19 1140   01/24/19 1800  ceFEPIme (MAXIPIME) 1 g in sodium chloride 0.9 % 100 mL IVPB     1 g 200 mL/hr over 30 Minutes Intravenous Every 24 hours 01/23/19 1122 01/29/19 2359   01/23/19 1200  ceFEPIme (MAXIPIME) 1 g in sodium chloride 0.9 % 100 mL IVPB     1 g 200 mL/hr over 30 Minutes Intravenous  Once 01/23/19 1122 01/23/19 1452   01/23/19 1130  vancomycin (VANCOCIN) IVPB 1000 mg/200 mL premix     1,000 mg 200 mL/hr over 60 Minutes Intravenous  Once 01/23/19 1122 01/23/19 1623   01/23/19 1123  vancomycin variable dose per unstable renal function (pharmacist dosing)  Status:  Discontinued      Does not apply See admin instructions 01/23/19 1123 01/28/19 1035   01/17/19 1300  remdesivir 100 mg in sodium chloride 0.9 % 250 mL IVPB     100 mg 500 mL/hr over 30 Minutes Intravenous Daily 01/17/19 1144 01/21/19 1106   01/16/19 1800  vancomycin (VANCOCIN) 500 mg in sodium chloride 0.9 % 100 mL IVPB     500 mg 100 mL/hr over 60 Minutes Intravenous  Once 01/13/19 1758 01/16/19 2323   01/13/19 1900  vancomycin (VANCOCIN) IVPB 1000 mg/200 mL premix     1,000 mg 200 mL/hr over 60 Minutes Intravenous  Once 01/13/19 1758 01/13/19 2200   01/12/19 1800  ceFEPIme (MAXIPIME) 1 g in sodium chloride 0.9 % 100 mL IVPB     1 g 200 mL/hr over 30 Minutes Intravenous Every 24 hours 01/11/19 0910 01/17/19 1904   01/12/19 1600  sulfamethoxazole-trimethoprim (BACTRIM) 400-80 MG per tablet 1 tablet    Note to Pharmacy: Take one tablet by mouth on Monday, Wednesday and fridays.     1 tablet Oral Every M-W-F 01/12/19 1449     01/11/19 0830  vancomycin (VANCOCIN) IVPB 1000 mg/200  mL premix  Status:  Discontinued     1,000 mg 200 mL/hr over 60 Minutes Intravenous  Once 01/11/19 0825 01/11/19 0901   01/11/19 0830  ceFEPIme (MAXIPIME) 2 g in sodium chloride 0.9 % 100 mL IVPB     2 g 200 mL/hr over 30 Minutes Intravenous  Once 01/11/19 0825 01/11/19 0952       Objective   Vitals:   01/28/19 1630 01/28/19 1635 01/28/19 1700 01/28/19 1721  BP: (!) 78/40 (!) 90/42 (!) 120/50 140/71  Pulse: 80 77    Resp: (!) 22 (!) 21    Temp:    97.8 F (36.6 C)  TempSrc:    Oral  SpO2:      Weight:      Height:        Intake/Output Summary (Last 24 hours) at 01/28/2019 1842 Last data filed at 01/28/2019 1721 Gross per 24 hour  Intake 70 ml  Output 2400 ml  Net -2330 ml   Filed Weights   01/27/19 0322 01/28/19 0500 01/28/19 1410  Weight: 51 kg 49.3 kg 49 kg     Physical Examination:    General: Appears in no acute distress  Cardiovascular: S1-S2, regular, no murmur auscultated  Respiratory: Clear to auscultation bilaterally  Abdomen: Abdomen is soft, nontender, no organomegaly  Extremities: No edema in the lower extremities  Neurologic: Alert , Oriented x3, no focal deficit noted, moving all extremities     Data Reviewed: I have personally reviewed following labs and imaging studies   Recent Results (from  the past 240 hour(s))  Culture, blood (routine x 2)     Status: None   Collection Time: 01/23/19 11:00 AM   Specimen: BLOOD RIGHT HAND  Result Value Ref Range Status   Specimen Description BLOOD RIGHT HAND  Final   Special Requests   Final    BOTTLES DRAWN AEROBIC ONLY Blood Culture results may not be optimal due to an inadequate volume of blood received in culture bottles   Culture   Final    NO GROWTH 5 DAYS Performed at [facility], [place] [address], [place], [place] [address]    Report Status 01/28/2019 FINAL  Final  Culture, blood (routine x 2)     Status: None   Collection Time: 01/23/19 11:11 AM   Specimen: BLOOD RIGHT HAND   Result Value Ref Range Status   Specimen Description BLOOD RIGHT HAND  Final   Special Requests   Final    BOTTLES DRAWN AEROBIC ONLY Blood Culture adequate volume   Culture   Final    NO GROWTH 5 DAYS Performed at [facility], [place] [address] [place], [place] [address]    Report Status 01/28/2019 FINAL  Final  MRSA PCR Screening     Status: Abnormal   Collection Time: 01/23/19 11:18 AM   Specimen: Nasal Mucosa; Nasopharyngeal  Result Value Ref Range Status   MRSA by PCR POSITIVE (A) NEGATIVE Final    Comment:        The GeneXpert MRSA Assay (FDA approved for NASAL specimens only), is one component of a comprehensive MRSA colonization surveillance program. It is not intended to diagnose MRSA infection nor to guide or monitor treatment for MRSA infections. RESULT CALLED TO, READ BACK BY AND VERIFIED WITH: Dennison Bulla RN 15:20 01/23/19 ([email or web address]) Performed at [facility], [place] [address], [place], [place] [address]      Liver Function Tests: Recent Labs  Lab 01/24/19 [id number] 01/25/19 0410 01/26/19 0500 01/27/19 0611 01/28/19 0415  AST 26 24 15 17  13*  ALT 31 33 26 22 19   ALKPHOS 74 86 85 93 89  BILITOT 1.2 1.0 0.8 0.5 0.7  PROT 5.5* 5.7* 5.6* 5.5* 5.6*  ALBUMIN 1.4* 1.5* 1.5* 1.7* 1.6*   No results for input(s): LIPASE, AMYLASE in the last 168 hours. No results for input(s): AMMONIA in the last 168 hours.  Cardiac Enzymes: No results for input(s): CKTOTAL, CKMB, CKMBINDEX, TROPONINI in the last 168 hours. BNP (last 3 results) Recent Labs    01/01/19 0826  BNP 154.8*      Admission status: Inpatient: Based on patients clinical presentation and evaluation of above clinical data, I have made determination that patient meets Inpatient criteria at this time.   [facility] Pager [phone]. If 7PM-7AM, please contact night-coverage at [email or web address], Office  [phone]  password [email or web address]  01/28/2019, 6:42 PM  LOS: 17 days

## 2019-01-28 NOTE — Progress Notes (Signed)
Subjective:   Patient not examined today directly given COVID-19 + status, utilizing exam of the primary team and observations of RN's.   1.4 L in and 350 cc UOP yest.  900 cc UOP today.    Objective Vital signs in last 24 hours: Vitals:   01/28/19 0915 01/28/19 1000 01/28/19 1042 01/28/19 1123  BP: (!) 193/77 (!) 193/77 (!) 177/73 (!) 151/56  Pulse:  79 84 69  Resp:  (!) 28 (!) 27 (!) 21  Temp:    98.9 F (37.2 C)  TempSrc:    Axillary  SpO2:  95% 96% 99%  Weight:      Height:       Weight change: -0.7 kg  Intake/Output Summary (Last 24 hours) at 01/28/2019 1158 Last data filed at 01/28/2019 0900 Gross per 24 hour  Intake 210 ml  Output 1250 ml  Net -1040 ml  Physical Exam:    Patient not examined today directly given COVID-19 + status, utilizing exam of the primary team and observations of RN's.    Summary: Pt is a 73 y.o. yo male s/p renal transplant but with CKD at baseline crt in the 3's who was admitted on 01/11/2019 with weakness after COVID treatment   Assessment/Plan: 1. Renal-  History of fairly advanced CKD (crt 3's) and renal transplant.  Acute on chronic change due to illness and volume depletion.  Since not thriving we spoke with family and the patient and a decision was made for a trial of HD to see if can clear MS and improve outlook. So far has had HD here on 11/19, 11/20 and 11/23.  Labs better B/Cr 142/ 5.78 >> 64/3.1 today.  Plan HD today , then Saturday.  2. S/p renal transplant-  Is on prograf and very low dose cellcept as well as decadron  - cont for now-   3. Anemia- is on procrit normally for anemia as OP-  Supportive therapy - giving ESA and given one unit of blood on 11/20 4.  Metabolic acidosis-  Resolved w/ HD 5. HTN/volume-  Hypertensive on 2-3 bp meds, wt's and BP's are up, will ^ UF w HD 6.   Hypernatremia- resolved 7.  Hypokalemia-  Resolved  Kelly Splinter, MD 01/28/2019, 11:58 AM    Labs: Basic Metabolic Panel: Recent Labs  Lab  01/23/19 0336  01/26/19 0500 01/27/19 0611 01/28/19 0415  NA 139   < > 138 137 136  K 2.7*   < > 3.8 4.3 4.7  CL 98   < > 103 101 101  CO2 31   < > 26 27 26   GLUCOSE 116*   < > 237* 172* 252*  BUN 70*   < > 109* 64* 77*  CREATININE 3.70*   < > 4.51* 3.41* 3.73*  CALCIUM 7.0*   < > 7.4* 7.5* 7.3*  PHOS 2.9  --   --  3.9 4.3   < > = values in this interval not displayed.   Liver Function Tests: Recent Labs  Lab 01/26/19 0500 01/27/19 0611 01/28/19 0415  AST 15 17 13*  ALT 26 22 19   ALKPHOS 85 93 89  BILITOT 0.8 0.5 0.7  PROT 5.6* 5.5* 5.6*  ALBUMIN 1.5* 1.7* 1.6*   No results for input(s): LIPASE, AMYLASE in the last 168 hours. No results for input(s): AMMONIA in the last 168 hours. CBC: Recent Labs  Lab 01/23/19 0336  01/24/19 0517  01/25/19 0410 01/26/19 0500 01/27/19 0611 01/28/19 0415  WBC 8.6  --  8.3  --  9.6 9.0 9.3 7.9  NEUTROABS 6.0  --  7.6  --   --   --   --  6.3  HGB 7.1*   < > 7.9*   < > 7.8* 7.5* 7.5* 7.0*  HCT 21.2*   < > 23.1*   < > 22.8* 22.7* 23.0* 22.1*  MCV 83.8  --  83.1  --  85.1 86.0 88.8 90.6  PLT 118*  --  146*  --  222 267 235 260   < > = values in this interval not displayed.   Cardiac Enzymes: No results for input(s): CKTOTAL, CKMB, CKMBINDEX, TROPONINI in the last 168 hours. CBG: Recent Labs  Lab 01/27/19 1555 01/27/19 1912 01/27/19 2257 01/28/19 0330 01/28/19 0727  GLUCAP 208* 227* 255* 247* 226*    Iron Studies:  Recent Labs    01/28/19 0415  FERRITIN 808*   Studies/Results: No results found. Medications: Infusions: . sodium chloride 10 mL/hr at 01/23/19 1900  . sodium chloride    . sodium chloride    . ceFEPime (MAXIPIME) IV Stopped (01/27/19 1930)    Scheduled Medications: . sodium chloride   Intravenous Once  . amLODipine  10 mg Oral Daily  . Chlorhexidine Gluconate Cloth  6 each Topical Q0600  . Chlorhexidine Gluconate Cloth  6 each Topical Q0600  . Chlorhexidine Gluconate Cloth  6 each Topical Q0600   . cloNIDine  0.2 mg Oral BID  . darbepoetin (ARANESP) injection - NON-DIALYSIS  150 mcg Subcutaneous Q Wed-1800  . feeding supplement (ENSURE ENLIVE)  237 mL Oral TID BM  . feeding supplement (VITAL AF 1.2 CAL)  1,000 mL Per Tube Q24H  . folic acid  1 mg Oral Daily  . free water  200 mL Per Tube Q8H  . magnesium oxide  400 mg Oral BID WC  . mouth rinse  15 mL Mouth Rinse BID  . metoprolol tartrate  50 mg Oral BID  . mupirocin ointment   Nasal BID  . mycophenolate  500 mg Oral BID  . pantoprazole  40 mg Oral BID  . [START ON 02/04/2019] predniSONE  10 mg Oral Q breakfast  . [START ON 02/01/2019] predniSONE  20 mg Oral Q breakfast  . [START ON 01/29/2019] predniSONE  30 mg Oral Q breakfast  . sodium chloride flush  3 mL Intravenous Q12H  . sulfamethoxazole-trimethoprim  1 tablet Oral Q M,W,F  . tacrolimus  4 mg Oral BID  . vancomycin  500 mg Intravenous Q Wed-HD    have reviewed scheduled and prn medications.    01/28/2019,11:58 AM  LOS: 17 days

## 2019-01-28 NOTE — Progress Notes (Signed)
  Speech Language Pathology Treatment: Dysphagia  Patient Details Name: Francisco Williamson MRN: [id number] DOB: 10/05/1945 Today's Date: 01/28/2019 Time: [id number] SLP Time Calculation (min) (ACUTE ONLY): 11 min  Assessment / Plan / Recommendation Clinical Impression  Pt alert, quiet, but answers questions about his needs.  Repositioned to optimize participation.  Cortrak remains present.  Pt accepted sips of water from a straw and several bites of puree.  His intake and participation in mealtime continues to wax and wane.  Overall, he has been consistently protecting his airway this week, with no overt concerns for aspiration.  His attention to chewing/oral manipulation of solids, spillage from his mouth, varies depending on overall MS. Today, he required occasional verbal cues to draw liquid from straw and to swallow with greater effort/volition, but swallow was functional with no concerns for aspiration.  Continue current dysphagia 1 diet/thin liquids until MS becomes more consistently reliable.  SLP will follow.   HPI HPI: 73 yo male presenting with AMS and failure to thrive, renal failure.Pt was admitted to [facility] from 10/28-11/3 for COVID + and PNA. 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  Clinical swallow evaluation was completed on 11/19 - pt was communicative and swallow appeared to be [facility]; regular diet was recommended.  11/20 CXR with worsening bilateral pna; continued 02 requirement, fever and AMS. Swallow evaluation reordered.        SLP Plan  Continue with current plan of care       Recommendations  Diet recommendations: Dysphagia 1 (puree);Thin liquid Liquids provided via: Cup;Straw Medication Administration: Crushed with puree Supervision: Staff to assist with self feeding Compensations: Slow rate;Small sips/bites Postural Changes and/or Swallow Maneuvers: Seated upright 90 degrees                Oral Care Recommendations: Oral care  BID Follow up Recommendations: None SLP Visit Diagnosis: Dysphagia, unspecified (R13.10) Plan: Continue with current plan of care       GO                Francisco Williamson 01/28/2019, 12:03 PM  Francisco Williamson L. Tivis Ringer, [place] Office number [phone] Pager [phone]

## 2019-01-28 NOTE — Progress Notes (Signed)
Pharmacy Antibiotic Note  Francisco Williamson is a 73 y.o. male admitted on 01/11/2019 with pneumonia.  Pharmacy has been consulted for Vancomycin and Cefepime dosing. Patient has CKD stage V (s/p renal transplantation).   CXR 11/20 with worsening bilateral pneumonia and increased oxygen requirements. Patient has fever (Tmax 100.6) and altered mental status. Pharmacy consulted to dose vancomycin and cefepime for HCAP. Pt is making urine, pt is ESRD on HD. Last HD 11/23 3 hr BFR @ 300. Pt has been intermittently refusing HD. Planning HD again today then Saturday.   Plan: -Cefepime 1 g IV q24h -Vancomycin 500 mg IV x1 with HD today -Both antibiotics to dc tomorrow  Height: 5\' 5"  (165.1 cm) Weight: 108 lb 11 oz (49.3 kg) IBW/kg (Calculated) : 61.5  Temp (24hrs), Avg:97.9 F (36.6 C), Min:97.6 F (36.4 C), Max:98.2 F (36.8 C)  Recent Labs  Lab 01/23/19 1113 01/23/19 1322 01/24/19 0517 01/25/19 0410 01/26/19 0500 01/27/19 0611 01/28/19 0415  WBC  --   --  8.3 9.6 9.0 9.3 7.9  CREATININE  --   --  3.38* 4.39* 4.51* 3.41* 3.73*  LATICACIDVEN 1.0 0.9  --   --   --   --   --     Estimated Creatinine Clearance: 12.7 mL/min (A) (by C-G formula based on SCr of 3.73 mg/dL (H)).    No Known Allergies  Antimicrobials this admission: Vancomycin 11/20 >> 11/26 Cefepime 11/20 >> 11/26  Dose adjustments this admission:  Microbiology results: 11/20 BCx:  11/20 UCx:  11/20 MRSA PCR:     Francisco Williamson 01/28/2019 10:32 AM

## 2019-01-28 NOTE — Progress Notes (Addendum)
Patient ID: Francisco Williamson, male   DOB: [birth year], 73 y.o.   MRN: [id number]  This NP continues to follow for palliative medicine needs and emotional support.   Thorough chart review and discussion with necessary members of the care team was completed as part of assessment. Spoke with Dr Darrick Meigs to update him on ongoing discussion with patient and family regarding GOCs  Per conversation with attending plan is for dialysis today hoping  patient will be more alert and able to participate in medical decision.   Patient continues to have very poor po intake, he is weak and lethargic and is failing to thrive.    I spoke by telephone with patient's  niece Francisco Williamson for continued conversation regarding current medical situation.  Explained that medical team  continues to have conversation with the patient trying to clarify his wishes   Family understand  the seriousness of the patient's current medical situation and the likely long-term poor prognosis.  Ultimately they would like for him to be able to come home, be cared for at home at this perceived time at end of life.  Unfortunately the patient was unable to clearly verbalize his wishes; significant decisions are pending,  Specifically artifical feeding, continued dialysis.  Family hope that the patient will gain enough strength to be able to clearly verbalize his wishes regarding continuation of dialysis and artificial feeding.  Medical team will continue to work with Francisco Williamson to clarify goals of care  Covid-19  positive again today   Discussed with family  the importance of continued conversation with the  medical providers regarding overall plan of care and treatment options,  ensuring decisions are within the context of the patients values and GOCs.  Questions and concerns addressed      Total time spent on the unit was 35 minutes  Greater than 50% of the time was spent in counseling and coordination of care  Wadie Lessen NP  Palliative  Medicine Team Team Phone # [phone] Pager [phone]

## 2019-01-28 NOTE — Progress Notes (Signed)
Physical Therapy Treatment Patient Details Name: Francisco Williamson MRN: [id number] DOB: 04-27-1945 Today's Date: 01/28/2019    History of Present Illness 73 yo male presenting with AMS and failure to thrive, renal failure.Pt was admitted to [facility] from 10/28-11/3 for COVID + and PNA. 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    PT Comments    Pt with slow progress. Requires much encouragement to engage and participate.    Follow Up Recommendations  SNF     Equipment Recommendations  None recommended by PT    Recommendations for Other Services       Precautions / Restrictions Precautions Precautions: Fall Restrictions Weight Bearing Restrictions: No    Mobility  Bed Mobility Overal bed mobility: Needs Assistance Bed Mobility: Supine to Sit;Sit to Supine     Supine to sit: HOB elevated;Mod assist Sit to supine: Min guard   General bed mobility comments: Assist to move legs off of bed, elevate trunk into sitting, and bring hips to EOB.  Transfers Overall transfer level: Needs assistance Equipment used: Rolling walker (2 wheeled) Transfers: Sit to/from Stand Sit to Stand: Min assist         General transfer comment: Assist to bring hips up and for balance.  Ambulation/Gait Ambulation/Gait assistance: Min assist Gait Distance (Feet): 3 Feet Assistive device: Rolling walker (2 wheeled) Gait Pattern/deviations: Step-to pattern;Decreased step length - right;Decreased step length - left Gait velocity: decr Gait velocity interpretation: <1.31 ft/sec, indicative of household ambulator General Gait Details: Pt refused to amb in room other than side stepping up the side of the bed toward the head.   Stairs             Wheelchair Mobility    Modified Rankin (Stroke Patients Only)       Balance Overall balance assessment: Needs assistance Sitting-balance support: Feet supported;No upper extremity supported Sitting  balance-Leahy Scale: Fair     Standing balance support: Bilateral upper extremity supported Standing balance-Leahy Scale: Poor Standing balance comment: walker and min assist for static standing. Stood x 3 for 30-60 seconds                            Cognition Arousal/Alertness: Awake/alert Behavior During Therapy: Flat affect Overall Cognitive Status: Impaired/Different from baseline Area of Impairment: Memory;Awareness;Following commands                     Memory: Decreased short-term memory     Awareness: Emergent Problem Solving: Slow processing;Requires verbal cues General Comments: Pt requires encouragement for participation      Exercises      General Comments General comments (skin integrity, edema, etc.): VSS      Pertinent Vitals/Pain Pain Assessment: No/denies pain    Home Living                      Prior Function            PT Goals (current goals can now be found in the care plan section) Acute Rehab PT Goals PT Goal Formulation: With patient Time For Goal Achievement: 02/10/19 Potential to Achieve Goals: Fair Progress towards PT goals: Goals downgraded-see care plan    Frequency    Min 2X/week      PT Plan Current plan remains appropriate    Co-evaluation              AM-PAC PT "6  Clicks" Mobility   Outcome Measure  Help needed turning from your back to your side while in a flat bed without using bedrails?: A Little Help needed moving from lying on your back to sitting on the side of a flat bed without using bedrails?: A Little Help needed moving to and from a bed to a chair (including a wheelchair)?: A Little Help needed standing up from a chair using your arms (e.g., wheelchair or bedside chair)?: A Little Help needed to walk in hospital room?: A Little Help needed climbing 3-5 steps with a railing? : Total 6 Click Score: 16    End of Session Equipment Utilized During Treatment: Gait  belt Activity Tolerance: Other (comment)(self limiting) Patient left: with call bell/phone within reach;in bed;with bed alarm set Nurse Communication: Mobility status PT Visit Diagnosis: Difficulty in walking, not elsewhere classified (R26.2);Adult, failure to thrive (R62.7)     Time: 1150-1220 PT Time Calculation (min) (ACUTE ONLY): 30 min  Charges:  $Therapeutic Activity: 23-37 mins                     [place] Pager [phone] Office [place] 01/28/2019, 4:11 PM

## 2019-01-28 NOTE — Progress Notes (Addendum)
   Vital Signs MEWS/VS Documentation      01/28/2019 0900 01/28/2019 0915 01/28/2019 1000 01/28/2019 1042   MEWS Score:  1  1  2  2    MEWS Score Color:  Green  Green  Yellow  Yellow   Resp:  -  -  (!) 28  (!) 27   Pulse:  -  -  79  84   BP:  -  (!) 193/77  (!) 193/77  (!) 177/73   O2 Device:  -  -  Room Air  -   Level of Consciousness:  Alert  -  Alert  -      SBP elevated into the 180's this AM. Attempted to put AM BP meds on hold due to HD appointment this afternoon. BP continued to elevate into the 190's. Gave Clonidine as scheduled with no decrease under 190 SBP. Gave Amlodipine as scheduled, now SBP in the 170s. MD notified of status change to yellow MEWS. Will continue to monitor.     Dawanda Mapel 01/28/2019,10:50 AM

## 2019-01-29 LAB — GLUCOSE, CAPILLARY
Glucose-Capillary: 176 mg/dL — ABNORMAL HIGH (ref 70–99)
Glucose-Capillary: 172 mg/dL — ABNORMAL HIGH (ref 70–99)
Glucose-Capillary: 187 mg/dL — ABNORMAL HIGH (ref 70–99)
Glucose-Capillary: 156 mg/dL — ABNORMAL HIGH (ref 70–99)
Glucose-Capillary: 191 mg/dL — ABNORMAL HIGH (ref 70–99)
Glucose-Capillary: 199 mg/dL — ABNORMAL HIGH (ref 70–99)
Glucose-Capillary: 222 mg/dL — ABNORMAL HIGH (ref 70–99)

## 2019-01-29 LAB — RENAL FUNCTION PANEL
Albumin: 1.7 g/dL — ABNORMAL LOW (ref 3.5–5.0)
Anion gap: 10 (ref 5–15)
BUN: 47 mg/dL — ABNORMAL HIGH (ref 8–23)
CO2: 27 mmol/L (ref 22–32)
Calcium: 7.4 mg/dL — ABNORMAL LOW (ref 8.9–10.3)
Chloride: 99 mmol/L (ref 98–111)
Creatinine, Ser: 2.71 mg/dL — ABNORMAL HIGH (ref 0.61–1.24)
GFR calc Af Amer: 26 mL/min — ABNORMAL LOW (ref >60)
GFR calc non Af Amer: 23 mL/min — ABNORMAL LOW (ref >60)
Glucose, Bld: 179 mg/dL — ABNORMAL HIGH (ref 70–99)
Phosphorus: 3.8 mg/dL (ref 2.5–4.6)
Potassium: 4.3 mmol/L (ref 3.5–5.1)
Sodium: 136 mmol/L (ref 135–145)

## 2019-01-29 LAB — CBC
HCT: 22 % — ABNORMAL LOW (ref 39.0–52.0)
Hemoglobin: 7.2 g/dL — ABNORMAL LOW (ref 13.0–17.0)
MCH: 29.3 pg (ref 26.0–34.0)
MCHC: 32.7 g/dL (ref 30.0–36.0)
MCV: 89.4 fL (ref 80.0–100.0)
Platelets: 246 10*3/uL (ref 150–400)
RBC: 2.46 MIL/uL — ABNORMAL LOW (ref 4.22–5.81)
RDW: 17 % — ABNORMAL HIGH (ref 11.5–15.5)
WBC: 10.4 10*3/uL (ref 4.0–10.5)
nRBC: 0.4 % — ABNORMAL HIGH (ref 0.0–0.2)

## 2019-01-29 NOTE — Progress Notes (Signed)
[facility]  PROGRESS NOTE  Francisco Williamson [id number] DOB: 12-26-1945 DOA: 01/11/2019 PCP: Default, Provider, MD   Brief HPI:   73 year old male with a history of hypertension, CKDstageV status post renal transplantation, presented with altered mental status.  He was admitted to [facility] from 12/30/2020 11-24 COVID-19 associated pneumonia with acute respiratory failure with hypoxia.  He completed course of remdesivir and steroids and was discharged home on 01/06/2019.  He was brought back to ED given failure to thrive, dehydration, altered mental status and renal failure.  He was initially treated for HCAP.  Worsened around 01/17/2019 and was treated with steroids and remdesivir and plasma.  He was transferred to [facility] for intermittent hemodialysis.  On 01/23/2019 he decompensated again noted mental status and respiratory distress and was restarted on steroids and antibiotics for HCAP.  Patient has improved from respiratory status but continues to have failure to thrive.  Requiring tube feedings with cortrak.  Nephrology is following for dialysis.  Palliative care was consulted for goals of care.    Subjective   Patient seen and examined, denies chest pain or shortness of breath.   Assessment/Plan:     1. Acute hypoxic respiratory failure-recurrent sepsis due to HCAP/COVID-19 pneumonia.  Improved.  O2 sats 96- 100% on room air.  Patient was treated with cefepime and vancomycin for 7 days.  Received steroids and remdesivir twice, convalescent plasma 01/17/2019.  2. Acute renal injury on CKD stage V-patient has history of renal transplant, baseline appears 3-4 range.  Nephrology following, plan for hemodialysis on 1123 for renal.  Continue Prograf, CellCept, Bactrim prophylaxis.  3. Failure to thrive-patient is unable to take care of himself at home.  Discharged on 01/06/2019 and readmitted on 01/11/2019.  Now presenting with worsening renal function.  Cortrak Feeding tube placed.   Speech therapy evaluation obtained, started on dysphagia 1 thin liquid diet on 01/24/2019.  4. Staph epidermidis UTI-treated with vancomycin.  5. Hypertension-blood pressure stable, continue Modicon, clonidine, as needed labetalol, hydralazine.  6. GI bleed-FOBT was positive DVT prophylaxis was held.  GI is not planning for colonoscopy.  Patient received 2 units PRBC.  Transfuse for hemoglobin less than 7.  7. Goals of care-Palliative  care was consulted for goals of care.  Discussed with patient, he wants to continue with hemodialysis, not sure about long-term feeding tube option.  Palliative care is following.    CBG: Recent Labs  Lab 01/28/19 2005 01/28/19 2339 01/29/19 0403 01/29/19 0718 01/29/19 1254  GLUCAP 172* 222* 187* 156* 191*    CBC: Recent Labs  Lab 01/23/19 0336  01/24/19 0517  01/25/19 0410 01/26/19 0500 01/27/19 0611 01/28/19 0415 01/29/19 0415  WBC 8.6  --  8.3  --  9.6 9.0 9.3 7.9 10.4  NEUTROABS 6.0  --  7.6  --   --   --   --  6.3  --   HGB 7.1*   < > 7.9*   < > 7.8* 7.5* 7.5* 7.0* 7.2*  HCT 21.2*   < > 23.1*   < > 22.8* 22.7* 23.0* 22.1* 22.0*  MCV 83.8  --  83.1  --  85.1 86.0 88.8 90.6 89.4  PLT 118*  --  146*  --  222 267 235 260 246   < > = values in this interval not displayed.    Basic Metabolic Panel: Recent Labs  Lab 01/23/19 0336  01/24/19 0517 01/25/19 0410 01/26/19 0500 01/27/19 0611 01/28/19 0415 01/29/19 0415  NA 139   < >  137 138 138 137 136 136  K 2.7*   < > 4.1 4.6 3.8 4.3 4.7 4.3  CL 98  --  100 100 103 101 101 99  CO2 31  --  29 28 26 27 26 27   GLUCOSE 116*  --  117* 256* 237* 172* 252* 179*  BUN 70*  --  65* 93* 109* 64* 77* 47*  CREATININE 3.70*  --  3.38* 4.39* 4.51* 3.41* 3.73* 2.71*  CALCIUM 7.0*  --  7.4* 7.8* 7.4* 7.5* 7.3* 7.4*  MG 1.6*  --  1.7 1.9 2.0 1.8 1.8  --   PHOS 2.9  --   --   --   --  3.9 4.3 3.8   < > = values in this interval not displayed.    COVID-19 Labs  Recent Labs    01/27/19 0611  01/28/19 0415  DDIMER 6.74* 3.14*  FERRITIN 1,070* 808*  CRP 2.5* 1.6*    Lab Results  Component Value Date   SARSCOV2NAA POSITIVE (A) 01/28/2019   SARSCOV2NAA POSITIVE (A) 12/31/2018     DVT prophylaxis: SCDs  Code Status: DNR  Family Communication: No family at bedside  Disposition Plan: likely home when medically ready for discharge        BMI  Estimated body mass index is 17.98 kg/m as calculated from the following:   Height as of this encounter: 5\' 5"  (1.651 m).   Weight as of this encounter: 49 kg.  Scheduled medications:  . amLODipine  10 mg Oral Daily  . Chlorhexidine Gluconate Cloth  6 each Topical Q0600  . Chlorhexidine Gluconate Cloth  6 each Topical Q0600  . Chlorhexidine Gluconate Cloth  6 each Topical Q0600  . cloNIDine  0.2 mg Oral BID  . darbepoetin (ARANESP) injection - NON-DIALYSIS  150 mcg Subcutaneous Q Wed-1800  . feeding supplement (ENSURE ENLIVE)  237 mL Oral TID BM  . feeding supplement (VITAL AF 1.2 CAL)  1,000 mL Per Tube Q24H  . folic acid  1 mg Oral Daily  . free water  200 mL Per Tube Q8H  . magnesium oxide  400 mg Oral BID WC  . mouth rinse  15 mL Mouth Rinse BID  . metoprolol tartrate  50 mg Oral BID  . mupirocin ointment   Nasal BID  . pantoprazole  40 mg Oral BID  . [START ON 02/04/2019] predniSONE  10 mg Oral Q breakfast  . [START ON 02/01/2019] predniSONE  20 mg Oral Q breakfast  . predniSONE  30 mg Oral Q breakfast  . sodium chloride flush  3 mL Intravenous Q12H  . sulfamethoxazole-trimethoprim  1 tablet Oral Q M,W,F  . tacrolimus  4 mg Oral BID    Consultants:  Nephrology  Procedures:  Cortrak placement on 11/17   Antibiotics:   Anti-infectives (From admission, onward)   Start     Dose/Rate Route Frequency Ordered Stop   01/28/19 1347  vancomycin (VANCOCIN) 500-5 MG/100ML-% IVPB    Note to Pharmacy: Cherylann Banas   : cabinet override      01/28/19 1347 01/28/19 1615   01/28/19 1200  vancomycin (VANCOCIN)  IVPB 500 mg/100 ml premix     500 mg 100 mL/hr over 60 Minutes Intravenous Every Wed (Hemodialysis) 01/28/19 1034 01/28/19 1713   01/27/19 0800  vancomycin (VANCOCIN) 500 mg in sodium chloride 0.9 % 100 mL IVPB     500 mg 100 mL/hr over 60 Minutes Intravenous  Once 01/27/19 0714 01/27/19 1140   01/24/19  1800  ceFEPIme (MAXIPIME) 1 g in sodium chloride 0.9 % 100 mL IVPB     1 g 200 mL/hr over 30 Minutes Intravenous Every 24 hours 01/23/19 1122 01/29/19 2359   01/23/19 1200  ceFEPIme (MAXIPIME) 1 g in sodium chloride 0.9 % 100 mL IVPB     1 g 200 mL/hr over 30 Minutes Intravenous  Once 01/23/19 1122 01/23/19 1452   01/23/19 1130  vancomycin (VANCOCIN) IVPB 1000 mg/200 mL premix     1,000 mg 200 mL/hr over 60 Minutes Intravenous  Once 01/23/19 1122 01/23/19 1623   01/23/19 1123  vancomycin variable dose per unstable renal function (pharmacist dosing)  Status:  Discontinued      Does not apply See admin instructions 01/23/19 1123 01/28/19 1035   01/17/19 1300  remdesivir 100 mg in sodium chloride 0.9 % 250 mL IVPB     100 mg 500 mL/hr over 30 Minutes Intravenous Daily 01/17/19 1144 01/21/19 1106   01/16/19 1800  vancomycin (VANCOCIN) 500 mg in sodium chloride 0.9 % 100 mL IVPB     500 mg 100 mL/hr over 60 Minutes Intravenous  Once 01/13/19 1758 01/16/19 2323   01/13/19 1900  vancomycin (VANCOCIN) IVPB 1000 mg/200 mL premix     1,000 mg 200 mL/hr over 60 Minutes Intravenous  Once 01/13/19 1758 01/13/19 2200   01/12/19 1800  ceFEPIme (MAXIPIME) 1 g in sodium chloride 0.9 % 100 mL IVPB     1 g 200 mL/hr over 30 Minutes Intravenous Every 24 hours 01/11/19 0910 01/17/19 1904   01/12/19 1600  sulfamethoxazole-trimethoprim (BACTRIM) 400-80 MG per tablet 1 tablet    Note to Pharmacy: Take one tablet by mouth on Monday, Wednesday and fridays.     1 tablet Oral Every M-W-F 01/12/19 1449     01/11/19 0830  vancomycin (VANCOCIN) IVPB 1000 mg/200 mL premix  Status:  Discontinued     1,000 mg 200  mL/hr over 60 Minutes Intravenous  Once 01/11/19 0825 01/11/19 0901   01/11/19 0830  ceFEPIme (MAXIPIME) 2 g in sodium chloride 0.9 % 100 mL IVPB     2 g 200 mL/hr over 30 Minutes Intravenous  Once 01/11/19 0825 01/11/19 0952       Objective   Vitals:   01/29/19 0748 01/29/19 0921 01/29/19 0922 01/29/19 1303  BP:  (!) 190/70 (!) 183/69 (!) 180/74  Pulse:   86 78  Resp:   (!) 27 (!) 23  Temp:   98.1 F (36.7 C) (!) 97.3 F (36.3 C)  TempSrc:   Oral Axillary  SpO2: 97%     Weight:      Height:        Intake/Output Summary (Last 24 hours) at 01/29/2019 1342 Last data filed at 01/28/2019 2215 Gross per 24 hour  Intake 60 ml  Output 2150 ml  Net -2090 ml   Filed Weights   01/27/19 0322 01/28/19 0500 01/28/19 1410  Weight: 51 kg 49.3 kg 49 kg     Physical Examination:  General-appears in no acute distress Heart-S1-S2, regular, no murmur auscultated Lungs-clear to auscultation bilaterally, no wheezing or crackles auscultated Abdomen-soft, nontender, no organomegaly Extremities-no edema in the lower extremities Neuro-alert, oriented x3, no focal deficit noted    Data Reviewed: I have personally reviewed following labs and imaging studies   Recent Results (from the past 240 hour(s))  Culture, blood (routine x 2)     Status: None   Collection Time: 01/23/19 11:00 AM   Specimen: BLOOD RIGHT  HAND  Result Value Ref Range Status   Specimen Description BLOOD RIGHT HAND  Final   Special Requests   Final    BOTTLES DRAWN AEROBIC ONLY Blood Culture results may not be optimal due to an inadequate volume of blood received in culture bottles   Culture   Final    NO GROWTH 5 DAYS Performed at [facility], [place] [address] [place], [place] [address]    Report Status 01/28/2019 FINAL  Final  Culture, blood (routine x 2)     Status: None   Collection Time: 01/23/19 11:11 AM   Specimen: BLOOD RIGHT HAND  Result Value Ref Range Status   Specimen Description BLOOD RIGHT  HAND  Final   Special Requests   Final    BOTTLES DRAWN AEROBIC ONLY Blood Culture adequate volume   Culture   Final    NO GROWTH 5 DAYS Performed at [facility], [place] [address] [place], [place] [address]    Report Status 01/28/2019 FINAL  Final  MRSA PCR Screening     Status: Abnormal   Collection Time: 01/23/19 11:18 AM   Specimen: Nasal Mucosa; Nasopharyngeal  Result Value Ref Range Status   MRSA by PCR POSITIVE (A) NEGATIVE Final    Comment:        The GeneXpert MRSA Assay (FDA approved for NASAL specimens only), is one component of a comprehensive MRSA colonization surveillance program. It is not intended to diagnose MRSA infection nor to guide or monitor treatment for MRSA infections. RESULT CALLED TO, READ BACK BY AND VERIFIED WITH: Dennison Bulla RN 15:20 01/23/19 ([email or web address]) Performed at [facility], [place] [address] [place], [place] [address]   SARS Coronavirus 2 by RT PCR (hospital order, performed in [facility] hospital lab) Nasopharyngeal Nasopharyngeal Swab     Status: Abnormal   Collection Time: 01/28/19  6:00 PM   Specimen: Nasopharyngeal Swab  Result Value Ref Range Status   SARS Coronavirus 2 POSITIVE (A) NEGATIVE Final    Comment: RESULT CALLED TO, READ BACK BY AND VERIFIED WITH: B RODDY RN 01/28/19 1903 JDW (NOTE) SARS-CoV-2 target nucleic acids are DETECTED SARS-CoV-2 RNA is generally detectable in upper respiratory specimens  during the acute phase of infection.  Positive results are indicative  of the presence of the identified virus, but do not rule out bacterial infection or co-infection with other pathogens not detected by the test.  Clinical correlation with patient history and  other diagnostic information is necessary to determine patient infection status.  The expected result is negative. Fact Sheet for Patients:   StrictlyIdeas.no  Fact Sheet for Healthcare Providers:    BankingDealers.co.za   This test is not yet approved or cleared by the [place] FDA and  has been authorized for detection and/or diagnosis of SARS-CoV-2 by FDA under an Emergency Use Authorization (EUA).  This EUA will remain in effect (meaning this test can be used ) for the duration of  the COVID-19 declaration under Section 564(b)(1) of the Act, 21 U.S.C. section 360-bbb-3(b)(1), unless the authorization is terminated or revoked sooner. Performed at [facility], [place] [address] [place], [place] [address]      Liver Function Tests: Recent Labs  Lab 01/24/19 [id number] 01/25/19 0410 01/26/19 0500 01/27/19 0611 01/28/19 0415 01/29/19 0415  AST 26 24 15 17  13*  --   ALT 31 33 26 22 19   --   ALKPHOS 74 86 85 93 89  --   BILITOT  1.2 1.0 0.8 0.5 0.7  --   PROT 5.5* 5.7* 5.6* 5.5* 5.6*  --   ALBUMIN 1.4* 1.5* 1.5* 1.7* 1.6* 1.7*   No results for input(s): LIPASE, AMYLASE in the last 168 hours. No results for input(s): AMMONIA in the last 168 hours.  Cardiac Enzymes: No results for input(s): CKTOTAL, CKMB, CKMBINDEX, TROPONINI in the last 168 hours. BNP (last 3 results) Recent Labs    01/01/19 0826  BNP 154.8*      Admission status: Inpatient: Based on patients clinical presentation and evaluation of above clinical data, I have made determination that patient meets Inpatient criteria at this time.   [facility] Pager [phone]. If 7PM-7AM, please contact night-coverage at [email or web address], Office  [phone]  password [place]  01/29/2019, 1:42 PM  LOS: 18 days

## 2019-01-29 NOTE — Progress Notes (Addendum)
Subjective:   patietn seen in room, no new c/o   Objective Vital signs in last 24 hours: Vitals:   01/29/19 0748 01/29/19 0921 01/29/19 0922 01/29/19 1303  BP:  (!) 190/70 (!) 183/69 (!) 180/74  Pulse:   86 78  Resp:   (!) 27 (!) 23  Temp:   98.1 F (36.7 C) (!) 97.3 F (36.3 C)  TempSrc:   Oral Axillary  SpO2: 97%     Weight:      Height:       Weight change: -0.3 kg  Intake/Output Summary (Last 24 hours) at 01/29/2019 1357 Last data filed at 01/28/2019 2215 Gross per 24 hour  Intake 60 ml  Output 2150 ml  Net -2090 ml  Physical Exam:  cachectic, not in distress, hoarse voice   No jvd   Chest scattered bibasilar rales    Cor reg      ABd soft ntnd    Summary: Pt is a 73 y.o. yo male s/p renal transplant but with CKD at baseline crt in the 3's who was admitted on 01/11/2019 with weakness after COVID treatment   Assessment/Plan: 1. Renal-  History of fairly advanced CKD (crt 3's) and renal transplant.  Acute on chronic change due to illness and volume depletion.  Since not thriving we spoke with family and the patient and a decision was made for a trial of HD to see if can clear MS and improve outlook. Has had HD x 4 and B/Cr are very low c/w his poor po intake and muscle mass. Plan next HD Sat (TTS while here).  Uremia should not be an issue anymore at this time.  2. S/p renal transplant-  Is on prograf and very low dose cellcept as well as decadron  - cont for now-   3. Anemia- is on procrit normally for anemia as OP-  Supportive therapy - giving ESA and given one unit of blood on 11/20 4.  Metabolic acidosis-  Resolved w/ HD 5. HTN/volume-  Some vol overload/ ^'d BP's and inc'd weights, ^UF goal w/ HD as tolerated next Rx 6.   EOL = pall care working w/ family/ pt to make decisions regarding aggressiveness of care, appreciate assist  Kelly Splinter, MD 01/29/2019, 1:57 PM    Labs: Basic Metabolic Panel: Recent Labs  Lab 01/27/19 0611 01/28/19 0415 01/29/19 0415   NA 137 136 136  K 4.3 4.7 4.3  CL 101 101 99  CO2 27 26 27   GLUCOSE 172* 252* 179*  BUN 64* 77* 47*  CREATININE 3.41* 3.73* 2.71*  CALCIUM 7.5* 7.3* 7.4*  PHOS 3.9 4.3 3.8   Liver Function Tests: Recent Labs  Lab 01/26/19 0500 01/27/19 0611 01/28/19 0415 01/29/19 0415  AST 15 17 13*  --   ALT 26 22 19   --   ALKPHOS 85 93 89  --   BILITOT 0.8 0.5 0.7  --   PROT 5.6* 5.5* 5.6*  --   ALBUMIN 1.5* 1.7* 1.6* 1.7*   No results for input(s): LIPASE, AMYLASE in the last 168 hours. No results for input(s): AMMONIA in the last 168 hours. CBC: Recent Labs  Lab 01/23/19 0336  01/24/19 0517  01/25/19 0410 01/26/19 0500 01/27/19 0611 01/28/19 0415 01/29/19 0415  WBC 8.6  --  8.3  --  9.6 9.0 9.3 7.9 10.4  NEUTROABS 6.0  --  7.6  --   --   --   --  6.3  --   HGB 7.1*   < >  7.9*   < > 7.8* 7.5* 7.5* 7.0* 7.2*  HCT 21.2*   < > 23.1*   < > 22.8* 22.7* 23.0* 22.1* 22.0*  MCV 83.8  --  83.1  --  85.1 86.0 88.8 90.6 89.4  PLT 118*  --  146*  --  222 267 235 260 246   < > = values in this interval not displayed.   Cardiac Enzymes: No results for input(s): CKTOTAL, CKMB, CKMBINDEX, TROPONINI in the last 168 hours. CBG: Recent Labs  Lab 01/28/19 2005 01/28/19 2339 01/29/19 0403 01/29/19 0718 01/29/19 1254  GLUCAP 172* 222* 187* 156* 191*    Iron Studies:  Recent Labs    01/28/19 0415  FERRITIN 808*   Studies/Results: No results found. Medications: Infusions: . sodium chloride 10 mL/hr at 01/28/19 2211  . ceFEPime (MAXIPIME) IV 1 g (01/28/19 1757)  . mycophenolate (CELLCEPT) IV 500 mg (01/29/19 1049)    Scheduled Medications: . amLODipine  10 mg Oral Daily  . Chlorhexidine Gluconate Cloth  6 each Topical Q0600  . Chlorhexidine Gluconate Cloth  6 each Topical Q0600  . Chlorhexidine Gluconate Cloth  6 each Topical Q0600  . cloNIDine  0.2 mg Oral BID  . darbepoetin (ARANESP) injection - NON-DIALYSIS  150 mcg Subcutaneous Q Wed-1800  . feeding supplement (ENSURE  ENLIVE)  237 mL Oral TID BM  . feeding supplement (VITAL AF 1.2 CAL)  1,000 mL Per Tube Q24H  . folic acid  1 mg Oral Daily  . free water  200 mL Per Tube Q8H  . magnesium oxide  400 mg Oral BID WC  . mouth rinse  15 mL Mouth Rinse BID  . metoprolol tartrate  50 mg Oral BID  . mupirocin ointment   Nasal BID  . pantoprazole  40 mg Oral BID  . [START ON 02/04/2019] predniSONE  10 mg Oral Q breakfast  . [START ON 02/01/2019] predniSONE  20 mg Oral Q breakfast  . predniSONE  30 mg Oral Q breakfast  . sodium chloride flush  3 mL Intravenous Q12H  . sulfamethoxazole-trimethoprim  1 tablet Oral Q M,W,F  . tacrolimus  4 mg Oral BID    have reviewed scheduled and prn medications.    01/29/2019,1:57 PM  LOS: 18 days

## 2019-01-30 LAB — GLUCOSE, CAPILLARY
Glucose-Capillary: 160 mg/dL — ABNORMAL HIGH (ref 70–99)
Glucose-Capillary: 114 mg/dL — ABNORMAL HIGH (ref 70–99)
Glucose-Capillary: 131 mg/dL — ABNORMAL HIGH (ref 70–99)
Glucose-Capillary: 168 mg/dL — ABNORMAL HIGH (ref 70–99)
Glucose-Capillary: 188 mg/dL — ABNORMAL HIGH (ref 70–99)
Glucose-Capillary: 160 mg/dL — ABNORMAL HIGH (ref 70–99)

## 2019-01-30 LAB — RENAL FUNCTION PANEL
Albumin: 1.7 g/dL — ABNORMAL LOW (ref 3.5–5.0)
Anion gap: 12 (ref 5–15)
BUN: 62 mg/dL — ABNORMAL HIGH (ref 8–23)
CO2: 26 mmol/L (ref 22–32)
Calcium: 7.6 mg/dL — ABNORMAL LOW (ref 8.9–10.3)
Chloride: 98 mmol/L (ref 98–111)
Creatinine, Ser: 3.23 mg/dL — ABNORMAL HIGH (ref 0.61–1.24)
GFR calc Af Amer: 21 mL/min — ABNORMAL LOW (ref >60)
GFR calc non Af Amer: 18 mL/min — ABNORMAL LOW (ref >60)
Glucose, Bld: 129 mg/dL — ABNORMAL HIGH (ref 70–99)
Phosphorus: 3.5 mg/dL (ref 2.5–4.6)
Potassium: 4.3 mmol/L (ref 3.5–5.1)
Sodium: 136 mmol/L (ref 135–145)

## 2019-01-30 LAB — CBC
HCT: 24.3 % — ABNORMAL LOW (ref 39.0–52.0)
Hemoglobin: 8 g/dL — ABNORMAL LOW (ref 13.0–17.0)
MCH: 29.6 pg (ref 26.0–34.0)
MCHC: 32.9 g/dL (ref 30.0–36.0)
MCV: 90 fL (ref 80.0–100.0)
Platelets: 294 10*3/uL (ref 150–400)
RBC: 2.7 MIL/uL — ABNORMAL LOW (ref 4.22–5.81)
RDW: 16.5 % — ABNORMAL HIGH (ref 11.5–15.5)
WBC: 11.4 10*3/uL — ABNORMAL HIGH (ref 4.0–10.5)
nRBC: 0.5 % — ABNORMAL HIGH (ref 0.0–0.2)

## 2019-01-30 MED ORDER — BENZONATATE 100 MG PO CAPS
200.0000 mg | ORAL_CAPSULE | Freq: Three times a day (TID) | ORAL | Status: DC | PRN
  Administered 2019-01-31: 200 mg via ORAL
  Filled 2019-01-30: qty 2

## 2019-01-30 MED ORDER — CHLORHEXIDINE GLUCONATE CLOTH 2 % EX PADS: 6.0000 | MEDICATED_PAD | Freq: Every day | CUTANEOUS | Status: DC

## 2019-01-30 MED ORDER — HYDRALAZINE HCL 25 MG PO TABS
12.5000 mg | ORAL_TABLET | Freq: Three times a day (TID) | ORAL | Status: DC
  Administered 2019-01-30 – 2019-01-31 (×4): 12.5 mg via ORAL
  Filled 2019-01-30 (×4): qty 1

## 2019-01-30 NOTE — Progress Notes (Signed)
Nephrology Progress Note:   Subjective:  He had HD on 11/25 with 1.5 kg UF.  He had 1.5 liters UOP over 11/26.  Has had a condom catheter.  Review of systems:  States some shortness of breath  Denies n/v States is eating a little    Objective Vital signs in last 24 hours: Vitals:   01/30/19 0002 01/30/19 0601 01/30/19 0752 01/30/19 1206  BP: (!) 172/64 (!) 175/87 (!) 177/85 (!) 170/76  Pulse:  82 71 78  Resp:   (!) 23 (!) 27  Temp:  (!) 97.3 F (36.3 C) 97.8 F (36.6 C) 98 F (36.7 C)  TempSrc:  Axillary Oral Oral  SpO2:  100% 98% 98%  Weight:      Height:       Weight change:   Intake/Output Summary (Last 24 hours) at 01/30/2019 1323 Last data filed at 01/30/2019 0453 Gross per 24 hour  Intake -  Output 2250 ml  Net -2250 ml   Physical Exam: General adult male in bed in NAD HEENT normocephalic atraumatic extraocular movements intact sclera anicteric Lungs clear but reduced on exam; 100% on room air  Heart S1S2 no rub Abdomen soft nontender nondistended Extremities no edema appreciated Psych no agitation  Neuro awake on arrival and answers questions  GU: condom catheter     Summary: Pt is a 73 y.o. yo male s/p renal transplant but with CKD at baseline crt in the 3's who was admitted on 01/11/2019 with weakness after COVID treatment   Assessment/Plan: 1. AKI on advanced CKD-  History of fairly advanced CKD (crt 3's) and renal transplant.  Acute on chronic change due to illness and volume depletion.  Since not thriving we spoke with family and the patient and a decision was made for a trial of HD to see if can clear MS and improve outlook.  BUN/Cr are very low c/w his poor po intake and muscle mass.  - HD on 11/28 per TTS schedule    2. S/p renal transplant-  Is on prograf and cellcept as well as pred taper  - cont for now     3. Anemia- is on procrit normally for anemia as OP-  Supportive therapy - giving ESA and given one unit of blood on 11/20.  Anemia  improving.  4.  Metabolic acidosis-  Resolved w/ HD  5. HTN/volume-  Start hydralazine 12.5 mg TID and assess; note pred burst   6.   EOL = pall care working w/ family/ pt to make decisions regarding aggressiveness of care, appreciate assist     Labs: Basic Metabolic Panel: Recent Labs  Lab 01/28/19 0415 01/29/19 0415 01/30/19 0417  NA 136 136 136  K 4.7 4.3 4.3  CL 101 99 98  CO2 26 27 26   GLUCOSE 252* 179* 129*  BUN 77* 47* 62*  CREATININE 3.73* 2.71* 3.23*  CALCIUM 7.3* 7.4* 7.6*  PHOS 4.3 3.8 3.5   Liver Function Tests: Recent Labs  Lab 01/26/19 0500 01/27/19 0611 01/28/19 0415 01/29/19 0415 01/30/19 0417  AST 15 17 13*  --   --   ALT 26 22 19   --   --   ALKPHOS 85 93 89  --   --   BILITOT 0.8 0.5 0.7  --   --   PROT 5.6* 5.5* 5.6*  --   --   ALBUMIN 1.5* 1.7* 1.6* 1.7* 1.7*    CBC: Recent Labs  Lab 01/24/19 0517  01/26/19 0500 01/27/19 0611 01/28/19  [id number] 01/29/19 0415 01/30/19 0417  WBC 8.3   < > 9.0 9.3 7.9 10.4 11.4*  NEUTROABS 7.6  --   --   --  6.3  --   --   HGB 7.9*   < > 7.5* 7.5* 7.0* 7.2* 8.0*  HCT 23.1*   < > 22.7* 23.0* 22.1* 22.0* 24.3*  MCV 83.1   < > 86.0 88.8 90.6 89.4 90.0  PLT 146*   < > 267 235 260 246 294   < > = values in this interval not displayed.    Iron Studies:  Recent Labs    01/28/19 0415  FERRITIN 808*   Studies/Results: No results found. Medications: Infusions: . sodium chloride 10 mL/hr at 01/28/19 2211  . mycophenolate (CELLCEPT) IV 500 mg (01/30/19 0932)    Scheduled Medications: . amLODipine  10 mg Oral Daily  . Chlorhexidine Gluconate Cloth  6 each Topical Q0600  . Chlorhexidine Gluconate Cloth  6 each Topical Q0600  . Chlorhexidine Gluconate Cloth  6 each Topical Q0600  . cloNIDine  0.2 mg Oral BID  . darbepoetin (ARANESP) injection - NON-DIALYSIS  150 mcg Subcutaneous Q Wed-1800  . feeding supplement (ENSURE ENLIVE)  237 mL Oral TID BM  . feeding supplement (VITAL AF 1.2 CAL)  1,000 mL Per  Tube Q24H  . folic acid  1 mg Oral Daily  . free water  200 mL Per Tube Q8H  . magnesium oxide  400 mg Oral BID WC  . mouth rinse  15 mL Mouth Rinse BID  . metoprolol tartrate  50 mg Oral BID  . mupirocin ointment   Nasal BID  . pantoprazole  40 mg Oral BID  . [START ON 02/04/2019] predniSONE  10 mg Oral Q breakfast  . [START ON 02/01/2019] predniSONE  20 mg Oral Q breakfast  . predniSONE  30 mg Oral Q breakfast  . sodium chloride flush  3 mL Intravenous Q12H  . sulfamethoxazole-trimethoprim  1 tablet Oral Q M,W,F  . tacrolimus  4 mg Oral BID    have reviewed scheduled and prn medications.   Claudia Desanctis 01/30/2019,1:23 PM

## 2019-01-30 NOTE — Progress Notes (Signed)
[facility]  PROGRESS NOTE  Francisco Williamson [id number] DOB: 10-28-1945 DOA: 01/11/2019 PCP: Default, Provider, MD   Brief HPI:   73 year old male with a history of hypertension, CKDstageV status post renal transplantation, presented with altered mental status.  He was admitted to [facility] from 12/30/2020 11-24 COVID-19 associated pneumonia with acute respiratory failure with hypoxia.  He completed course of remdesivir and steroids and was discharged home on 01/06/2019.  He was brought back to ED given failure to thrive, dehydration, altered mental status and renal failure.  He was initially treated for HCAP.  Worsened around 01/17/2019 and was treated with steroids and remdesivir and plasma.  He was transferred to [facility] for intermittent hemodialysis.  On 01/23/2019 he decompensated again noted mental status and respiratory distress and was restarted on steroids and antibiotics for HCAP.  Patient has improved from respiratory status but continues to have failure to thrive.  Requiring tube feedings with cortrak.  Nephrology is following for dialysis.  Palliative care was consulted for goals of care.    Subjective   Patient seen and examined, denies shortness of breath.  Feeding tube in place.  Started on dysphagia 1 diet.   Assessment/Plan:     1. Acute hypoxic respiratory failure-recurrent sepsis due to HCAP/COVID-19 pneumonia.  Improved.  O2 sats 96- 100% on room air.  Patient was treated with cefepime and vancomycin for 7 days.  Received steroids and remdesivir twice, convalescent plasma 01/17/2019.  2. Acute renal injury on CKD stage V-patient has history of renal transplant, baseline appears 3-4 range.  Nephrology following, plan for hemodialysis on 1123 for renal.  Continue Prograf, CellCept, Bactrim prophylaxis.  3. Failure to thrive-patient is unable to take care of himself at home.  Discharged on 01/06/2019 and readmitted on 01/11/2019.  Now presenting with worsening renal  function.  Cortrak Feeding tube placed.  Speech therapy evaluation obtained, started on dysphagia 1 thin liquid diet on 01/28/2019.  Will change feeding to nighttime only so he can eat in the morning.  4. Staph epidermidis UTI-treated with vancomycin.  5. Hypertension-blood pressure stable, continue Modicon, clonidine, as needed labetalol, hydralazine.  6. GI bleed-FOBT was positive DVT prophylaxis was held.  GI is not planning for colonoscopy.  Patient received 2 units PRBC.  Transfuse for hemoglobin less than 7.  7. Goals of care-Palliative  care was consulted for goals of care.  Discussed with patient, he wants to continue with hemodialysis, not sure about long-term feeding tube option.  Palliative care is following.    CBG: Recent Labs  Lab 01/29/19 1914 01/29/19 2259 01/30/19 0003 01/30/19 0433 01/30/19 0820  GLUCAP 188* 168* 114* 131* 160*    CBC: Recent Labs  Lab 01/24/19 0517  01/26/19 0500 01/27/19 0611 01/28/19 0415 01/29/19 0415 01/30/19 0417  WBC 8.3   < > 9.0 9.3 7.9 10.4 11.4*  NEUTROABS 7.6  --   --   --  6.3  --   --   HGB 7.9*   < > 7.5* 7.5* 7.0* 7.2* 8.0*  HCT 23.1*   < > 22.7* 23.0* 22.1* 22.0* 24.3*  MCV 83.1   < > 86.0 88.8 90.6 89.4 90.0  PLT 146*   < > 267 235 260 246 294   < > = values in this interval not displayed.    Basic Metabolic Panel: Recent Labs  Lab 01/24/19 0517 01/25/19 0410 01/26/19 0500 01/27/19 0611 01/28/19 0415 01/29/19 0415 01/30/19 0417  NA 137 138 138 137 136 136 136  K 4.1 4.6 3.8 4.3 4.7 4.3 4.3  CL 100 100 103 101 101 99 98  CO2 29 28 26 27 26 27 26   GLUCOSE 117* 256* 237* 172* 252* 179* 129*  BUN 65* 93* 109* 64* 77* 47* 62*  CREATININE 3.38* 4.39* 4.51* 3.41* 3.73* 2.71* 3.23*  CALCIUM 7.4* 7.8* 7.4* 7.5* 7.3* 7.4* 7.6*  MG 1.7 1.9 2.0 1.8 1.8  --   --   PHOS  --   --   --  3.9 4.3 3.8 3.5    COVID-19 Labs  Recent Labs    01/28/19 0415  DDIMER 3.14*  FERRITIN 808*  CRP 1.6*    Lab Results   Component Value Date   SARSCOV2NAA POSITIVE (A) 01/28/2019   SARSCOV2NAA POSITIVE (A) 12/31/2018     DVT prophylaxis: SCDs  Code Status: DNR  Family Communication: No family at bedside  Disposition Plan: likely home when medically ready for discharge        BMI  Estimated body mass index is 17.98 kg/m as calculated from the following:   Height as of this encounter: 5\' 5"  (1.651 m).   Weight as of this encounter: 49 kg.  Scheduled medications:  . amLODipine  10 mg Oral Daily  . Chlorhexidine Gluconate Cloth  6 each Topical Q0600  . Chlorhexidine Gluconate Cloth  6 each Topical Q0600  . Chlorhexidine Gluconate Cloth  6 each Topical Q0600  . cloNIDine  0.2 mg Oral BID  . darbepoetin (ARANESP) injection - NON-DIALYSIS  150 mcg Subcutaneous Q Wed-1800  . feeding supplement (ENSURE ENLIVE)  237 mL Oral TID BM  . feeding supplement (VITAL AF 1.2 CAL)  1,000 mL Per Tube Q24H  . folic acid  1 mg Oral Daily  . free water  200 mL Per Tube Q8H  . hydrALAZINE  12.5 mg Oral Q8H  . magnesium oxide  400 mg Oral BID WC  . mouth rinse  15 mL Mouth Rinse BID  . metoprolol tartrate  50 mg Oral BID  . mupirocin ointment   Nasal BID  . pantoprazole  40 mg Oral BID  . [START ON 02/04/2019] predniSONE  10 mg Oral Q breakfast  . [START ON 02/01/2019] predniSONE  20 mg Oral Q breakfast  . predniSONE  30 mg Oral Q breakfast  . sodium chloride flush  3 mL Intravenous Q12H  . sulfamethoxazole-trimethoprim  1 tablet Oral Q M,W,F  . tacrolimus  4 mg Oral BID    Consultants:  Nephrology  Procedures:  Cortrak placement on 11/17   Antibiotics:   Anti-infectives (From admission, onward)   Start     Dose/Rate Route Frequency Ordered Stop   01/28/19 1347  vancomycin (VANCOCIN) 500-5 MG/100ML-% IVPB    Note to Pharmacy: Cherylann Banas   : cabinet override      01/28/19 1347 01/28/19 1615   01/28/19 1200  vancomycin (VANCOCIN) IVPB 500 mg/100 ml premix     500 mg 100 mL/hr over 60  Minutes Intravenous Every Wed (Hemodialysis) 01/28/19 1034 01/28/19 1713   01/27/19 0800  vancomycin (VANCOCIN) 500 mg in sodium chloride 0.9 % 100 mL IVPB     500 mg 100 mL/hr over 60 Minutes Intravenous  Once 01/27/19 0714 01/27/19 1140   01/24/19 1800  ceFEPIme (MAXIPIME) 1 g in sodium chloride 0.9 % 100 mL IVPB     1 g 200 mL/hr over 30 Minutes Intravenous Every 24 hours 01/23/19 1122 01/29/19 1841   01/23/19 1200  ceFEPIme (MAXIPIME) 1  g in sodium chloride 0.9 % 100 mL IVPB     1 g 200 mL/hr over 30 Minutes Intravenous  Once 01/23/19 1122 01/23/19 1452   01/23/19 1130  vancomycin (VANCOCIN) IVPB 1000 mg/200 mL premix     1,000 mg 200 mL/hr over 60 Minutes Intravenous  Once 01/23/19 1122 01/23/19 1623   01/23/19 1123  vancomycin variable dose per unstable renal function (pharmacist dosing)  Status:  Discontinued      Does not apply See admin instructions 01/23/19 1123 01/28/19 1035   01/17/19 1300  remdesivir 100 mg in sodium chloride 0.9 % 250 mL IVPB     100 mg 500 mL/hr over 30 Minutes Intravenous Daily 01/17/19 1144 01/21/19 1106   01/16/19 1800  vancomycin (VANCOCIN) 500 mg in sodium chloride 0.9 % 100 mL IVPB     500 mg 100 mL/hr over 60 Minutes Intravenous  Once 01/13/19 1758 01/16/19 2323   01/13/19 1900  vancomycin (VANCOCIN) IVPB 1000 mg/200 mL premix     1,000 mg 200 mL/hr over 60 Minutes Intravenous  Once 01/13/19 1758 01/13/19 2200   01/12/19 1800  ceFEPIme (MAXIPIME) 1 g in sodium chloride 0.9 % 100 mL IVPB     1 g 200 mL/hr over 30 Minutes Intravenous Every 24 hours 01/11/19 0910 01/17/19 1904   01/12/19 1600  sulfamethoxazole-trimethoprim (BACTRIM) 400-80 MG per tablet 1 tablet    Note to Pharmacy: Take one tablet by mouth on Monday, Wednesday and fridays.     1 tablet Oral Every M-W-F 01/12/19 1449     01/11/19 0830  vancomycin (VANCOCIN) IVPB 1000 mg/200 mL premix  Status:  Discontinued     1,000 mg 200 mL/hr over 60 Minutes Intravenous  Once 01/11/19 0825  01/11/19 0901   01/11/19 0830  ceFEPIme (MAXIPIME) 2 g in sodium chloride 0.9 % 100 mL IVPB     2 g 200 mL/hr over 30 Minutes Intravenous  Once 01/11/19 0825 01/11/19 0952       Objective   Vitals:   01/30/19 0601 01/30/19 0752 01/30/19 1206 01/30/19 1545  BP: (!) 175/87 (!) 177/85 (!) 170/76 (!) 170/75  Pulse: 82 71 78 92  Resp:  (!) 23 20 19   Temp: (!) 97.3 F (36.3 C) 97.8 F (36.6 C) 98 F (36.7 C) 98.1 F (36.7 C)  TempSrc: Axillary Oral Oral Oral  SpO2: 100% 98% 98% 98%  Weight:      Height:        Intake/Output Summary (Last 24 hours) at 01/30/2019 1612 Last data filed at 01/30/2019 1145 Gross per 24 hour  Intake 1083.91 ml  Output 3000 ml  Net -1916.09 ml   Filed Weights   01/27/19 0322 01/28/19 0500 01/28/19 1410  Weight: 51 kg 49.3 kg 49 kg     Physical Examination:  General-appears in no acute distress Heart-S1-S2, regular, no murmur auscultated Lungs-clear to auscultation bilaterally, no wheezing or crackles auscultated Abdomen-soft, nontender, no organomegaly Extremities-no edema in the lower extremities Neuro-alert, oriented x3, no focal deficit noted   Data Reviewed: I have personally reviewed following labs and imaging studies   Recent Results (from the past 240 hour(s))  Culture, blood (routine x 2)     Status: None   Collection Time: 01/23/19 11:00 AM   Specimen: BLOOD RIGHT HAND  Result Value Ref Range Status   Specimen Description BLOOD RIGHT HAND  Final   Special Requests   Final    BOTTLES DRAWN AEROBIC ONLY Blood Culture results may not be  optimal due to an inadequate volume of blood received in culture bottles   Culture   Final    NO GROWTH 5 DAYS Performed at [facility], [place] [address] [place], [place] [address]    Report Status 01/28/2019 FINAL  Final  Culture, blood (routine x 2)     Status: None   Collection Time: 01/23/19 11:11 AM   Specimen: BLOOD RIGHT HAND  Result Value Ref Range Status   Specimen  Description BLOOD RIGHT HAND  Final   Special Requests   Final    BOTTLES DRAWN AEROBIC ONLY Blood Culture adequate volume   Culture   Final    NO GROWTH 5 DAYS Performed at [facility], [place] [address], [place], [place] [address]    Report Status 01/28/2019 FINAL  Final  MRSA PCR Screening     Status: Abnormal   Collection Time: 01/23/19 11:18 AM   Specimen: Nasal Mucosa; Nasopharyngeal  Result Value Ref Range Status   MRSA by PCR POSITIVE (A) NEGATIVE Final    Comment:        The GeneXpert MRSA Assay (FDA approved for NASAL specimens only), is one component of a comprehensive MRSA colonization surveillance program. It is not intended to diagnose MRSA infection nor to guide or monitor treatment for MRSA infections. RESULT CALLED TO, READ BACK BY AND VERIFIED WITH: Dennison Bulla RN 15:20 01/23/19 ([email or web address]) Performed at [facility], [place] [address], [place], [place] [address]   SARS Coronavirus 2 by RT PCR (hospital order, performed in [facility] hospital lab) Nasopharyngeal Nasopharyngeal Swab     Status: Abnormal   Collection Time: 01/28/19  6:00 PM   Specimen: Nasopharyngeal Swab  Result Value Ref Range Status   SARS Coronavirus 2 POSITIVE (A) NEGATIVE Final    Comment: RESULT CALLED TO, READ BACK BY AND VERIFIED WITH: B RODDY RN 01/28/19 1903 JDW (NOTE) SARS-CoV-2 target nucleic acids are DETECTED SARS-CoV-2 RNA is generally detectable in upper respiratory specimens  during the acute phase of infection.  Positive results are indicative  of the presence of the identified virus, but do not rule out bacterial infection or co-infection with other pathogens not detected by the test.  Clinical correlation with patient history and  other diagnostic information is necessary to determine patient infection status.  The expected result is negative. Fact Sheet for Patients:   StrictlyIdeas.no  Fact Sheet for Healthcare Providers:    BankingDealers.co.za   This test is not yet approved or cleared by the [place] FDA and  has been authorized for detection and/or diagnosis of SARS-CoV-2 by FDA under an Emergency Use Authorization (EUA).  This EUA will remain in effect (meaning this test can be used ) for the duration of  the COVID-19 declaration under Section 564(b)(1) of the Act, 21 U.S.C. section 360-bbb-3(b)(1), unless the authorization is terminated or revoked sooner. Performed at [facility], [place] [address] [place], [place] [address]      Liver Function Tests: Recent Labs  Lab 01/24/19 [id number] 01/25/19 0410 01/26/19 0500 01/27/19 0611 01/28/19 0415 01/29/19 0415 01/30/19 0417  AST 26 24 15 17  13*  --   --   ALT 31 33 26 22 19   --   --   ALKPHOS 74 86 85 93 89  --   --   BILITOT 1.2 1.0 0.8 0.5 0.7  --   --   PROT 5.5* 5.7* 5.6* 5.5* 5.6*  --   --  ALBUMIN 1.4* 1.5* 1.5* 1.7* 1.6* 1.7* 1.7*   No results for input(s): LIPASE, AMYLASE in the last 168 hours. No results for input(s): AMMONIA in the last 168 hours.  Cardiac Enzymes: No results for input(s): CKTOTAL, CKMB, CKMBINDEX, TROPONINI in the last 168 hours. BNP (last 3 results) Recent Labs    01/01/19 0826  BNP 154.8*      Admission status: Inpatient: Based on patients clinical presentation and evaluation of above clinical data, I have made determination that patient meets Inpatient criteria at this time.   [facility] Pager [phone]. If 7PM-7AM, please contact night-coverage at [email or web address], Office  [phone]  password [email or web address]  01/30/2019, 4:12 PM  LOS: 19 days

## 2019-01-31 DIAGNOSIS — Z515 Encounter for palliative care: Secondary | ICD-10-CM

## 2019-01-31 DIAGNOSIS — Z7189 Other specified counseling: Secondary | ICD-10-CM

## 2019-01-31 LAB — GLUCOSE, CAPILLARY
Glucose-Capillary: 123 mg/dL — ABNORMAL HIGH (ref 70–99)
Glucose-Capillary: 138 mg/dL — ABNORMAL HIGH (ref 70–99)
Glucose-Capillary: 156 mg/dL — ABNORMAL HIGH (ref 70–99)
Glucose-Capillary: 222 mg/dL — ABNORMAL HIGH (ref 70–99)

## 2019-01-31 LAB — RENAL FUNCTION PANEL
Albumin: 1.6 g/dL — ABNORMAL LOW (ref 3.5–5.0)
Anion gap: 10 (ref 5–15)
BUN: 69 mg/dL — ABNORMAL HIGH (ref 8–23)
CO2: 25 mmol/L (ref 22–32)
Calcium: 7.5 mg/dL — ABNORMAL LOW (ref 8.9–10.3)
Chloride: 101 mmol/L (ref 98–111)
Creatinine, Ser: 3.51 mg/dL — ABNORMAL HIGH (ref 0.61–1.24)
GFR calc Af Amer: 19 mL/min — ABNORMAL LOW (ref >60)
GFR calc non Af Amer: 17 mL/min — ABNORMAL LOW (ref >60)
Glucose, Bld: 155 mg/dL — ABNORMAL HIGH (ref 70–99)
Phosphorus: 3.8 mg/dL (ref 2.5–4.6)
Potassium: 4 mmol/L (ref 3.5–5.1)
Sodium: 136 mmol/L (ref 135–145)

## 2019-01-31 LAB — TACROLIMUS LEVEL: Tacrolimus (FK506) - LabCorp: 5.9 ng/mL (ref 2.0–20.0)

## 2019-01-31 MED ORDER — HYDRALAZINE HCL 25 MG PO TABS
25.0000 mg | ORAL_TABLET | Freq: Three times a day (TID) | ORAL | Status: DC
  Administered 2019-02-01 – 2019-02-06 (×15): 25 mg via ORAL
  Filled 2019-01-31 (×16): qty 1

## 2019-01-31 MED ORDER — HYDRALAZINE HCL 25 MG PO TABS: 25.0000 mg | ORAL_TABLET | Freq: Three times a day (TID) | ORAL | Status: DC

## 2019-01-31 NOTE — Progress Notes (Signed)
Nephrology Progress Note:   Subjective:  He last had HD on 11/25 with 1.5 kg UF.  He had 1.6 liters UOP over 11/27.  Has expressed wanting to continue with HD  Review of systems: notes reviewed.  Hasn't had a good appetite on chart review.  Taking some PO . Making some urine.  Objective Vital signs in last 24 hours: Vitals:   01/30/19 2236 01/31/19 0028 01/31/19 0408 01/31/19 0600  BP: (!) 185/75 134/61 (!) 169/64 (!) 170/69  Pulse:  77 79   Resp:  20 20   Temp:  98 F (36.7 C) 98 F (36.7 C)   TempSrc:  Axillary Axillary   SpO2:  99% 100%   Weight:   49.3 kg   Height:       Weight change:   Intake/Output Summary (Last 24 hours) at 01/31/2019 1408 Last data filed at 01/31/2019 1148 Gross per 24 hour  Intake 1172.25 ml  Output 800 ml  Net 372.25 ml   Physical Exam: examined in person on 11/27 with appropriate PPE.  Primary team exam and notes reviewed 11/28.  No acute distress and no edema is noted; lungs were noted to be clear Due to the nature of this patient's suspected COVID-19 with isolation and in keeping with efforts to prevent the spread of infection and to conserve personal protective equipment, a physical exam was not personally performed.  Patient's symptoms and exam were discussed in detail with the RN.  A chart review of other providers notes and the patient's lab work as well as review of other pertinent studies was performed.  Exam details from prior documentation were reviewed specifically and confirmed with the bedside nurse.  Location of service: [facility]    Summary: Pt is a 73 y.o. yo male s/p renal transplant but with CKD at baseline crt in the 3's who was admitted on 01/11/2019 with weakness after COVID treatment   Assessment/Plan: 1. AKI on advanced CKD-  History of fairly advanced CKD (crt 3's) and renal transplant.  Acute on chronic change due to illness and volume depletion.  Since not thriving we spoke with family and the patient and a decision  was made for a trial of HD to see if can clear MS and improve outlook.  BUN/Cr are very low c/w his poor po intake and muscle mass.  - HD today 11/28 per TTS schedule    2. S/p renal transplant-  Is on prograf and cellcept as well as pred taper  - cont for now     3. Anemia- is on procrit normally for anemia as OP-  Supportive therapy - giving ESA and given one unit of blood on 11/20.  Anemia improving pn last check on 11/27   4.  Metabolic acidosis-  Resolved w/ HD  5. HTN/volume-  Set hydralazine as 25 mg TID on 11/27; note pred burst   6.   EOL = pall care was consulted     Labs: Basic Metabolic Panel: Recent Labs  Lab 01/29/19 0415 01/30/19 0417 01/31/19 0430  NA 136 136 136  K 4.3 4.3 4.0  CL 99 98 101  CO2 27 26 25   GLUCOSE 179* 129* 155*  BUN 47* 62* 69*  CREATININE 2.71* 3.23* 3.51*  CALCIUM 7.4* 7.6* 7.5*  PHOS 3.8 3.5 3.8   Liver Function Tests: Recent Labs  Lab 01/26/19 0500 01/27/19 0611 01/28/19 0415 01/29/19 0415 01/30/19 0417 01/31/19 0430  AST 15 17 13*  --   --   --  ALT 26 22 19   --   --   --   ALKPHOS 85 93 89  --   --   --   BILITOT 0.8 0.5 0.7  --   --   --   PROT 5.6* 5.5* 5.6*  --   --   --   ALBUMIN 1.5* 1.7* 1.6* 1.7* 1.7* 1.6*    CBC: Recent Labs  Lab 01/26/19 0500 01/27/19 0611 01/28/19 0415 01/29/19 0415 01/30/19 0417  WBC 9.0 9.3 7.9 10.4 11.4*  NEUTROABS  --   --  6.3  --   --   HGB 7.5* 7.5* 7.0* 7.2* 8.0*  HCT 22.7* 23.0* 22.1* 22.0* 24.3*  MCV 86.0 88.8 90.6 89.4 90.0  PLT 267 235 260 246 294    Iron Studies:  Studies/Results: No results found. Medications: Infusions: . sodium chloride 10 mL/hr at 01/28/19 2211  . mycophenolate (CELLCEPT) IV 500 mg (01/31/19 1148)    Scheduled Medications: . amLODipine  10 mg Oral Daily  . Chlorhexidine Gluconate Cloth  6 each Topical Q0600  . Chlorhexidine Gluconate Cloth  6 each Topical Q0600  . Chlorhexidine Gluconate Cloth  6 each Topical Q0600  . cloNIDine  0.2 mg  Oral BID  . darbepoetin (ARANESP) injection - NON-DIALYSIS  150 mcg Subcutaneous Q Wed-1800  . feeding supplement (ENSURE ENLIVE)  237 mL Oral TID BM  . feeding supplement (VITAL AF 1.2 CAL)  1,000 mL Per Tube Q24H  . folic acid  1 mg Oral Daily  . free water  200 mL Per Tube Q8H  . hydrALAZINE  12.5 mg Oral Q8H  . magnesium oxide  400 mg Oral BID WC  . mouth rinse  15 mL Mouth Rinse BID  . metoprolol tartrate  50 mg Oral BID  . mupirocin ointment   Nasal BID  . pantoprazole  40 mg Oral BID  . [START ON 02/04/2019] predniSONE  10 mg Oral Q breakfast  . [START ON 02/01/2019] predniSONE  20 mg Oral Q breakfast  . sodium chloride flush  3 mL Intravenous Q12H  . sulfamethoxazole-trimethoprim  1 tablet Oral Q M,W,F  . tacrolimus  4 mg Oral BID    have reviewed scheduled and prn medications.   Claudia Desanctis 01/31/2019,2:08 PM

## 2019-01-31 NOTE — Progress Notes (Signed)
[facility]  PROGRESS NOTE  Francisco Williamson [id number] DOB: 05-Dec-1945 DOA: 01/11/2019 PCP: Default, Provider, MD   Brief HPI:   72 year old male with a history of hypertension, CKDstageV status post renal transplantation, presented with altered mental status.  He was admitted to [facility] from 12/30/2020 11-24 COVID-19 associated pneumonia with acute respiratory failure with hypoxia.  He completed course of remdesivir and steroids and was discharged home on 01/06/2019.  He was brought back to ED given failure to thrive, dehydration, altered mental status and renal failure.  He was initially treated for HCAP.  Worsened around 01/17/2019 and was treated with steroids and remdesivir and plasma.  He was transferred to [facility] for intermittent hemodialysis.  On 01/23/2019 he decompensated again noted mental status and respiratory distress and was restarted on steroids and antibiotics for HCAP.  Patient has improved from respiratory status but continues to have failure to thrive.  Requiring tube feedings with cortrak.  Nephrology is following for dialysis.  Palliative care was consulted for goals of care.    Subjective   Patient seen and examined, started on dysphagia 1 diet, but poor p.o. intake likely from poor appetite.  Patient has been getting tube feeds.  Tube feeding was changed to nighttime feeding only.   Assessment/Plan:     1. Acute hypoxic respiratory failure-recurrent sepsis due to HCAP/COVID-19 pneumonia.  Improved.  O2 sats 96- 100% on room air.  Patient was treated with cefepime and vancomycin for 7 days.  Received steroids and remdesivir twice, convalescent plasma 01/17/2019.  2. Acute renal injury on CKD stage V-patient has history of renal transplant, baseline appears 3-4 range.  Nephrology following, plan for hemodialysis on 1123 for renal.  Continue Prograf, CellCept, Bactrim prophylaxis.  3. Failure to thrive-patient is unable to take care of himself at home.   Discharged on 01/06/2019 and readmitted on 01/11/2019.  Now presenting with worsening renal function.  Cortrak Feeding tube placed.  Speech therapy evaluation obtained, started on dysphagia 1 thin liquid diet on 01/28/2019.  Patient has poor appetite, will hold tube feeding at this time.  4. Staph epidermidis UTI-treated with vancomycin.  5. Hypertension-blood pressure stable, continue Modicon, clonidine, as needed labetalol, hydralazine.  6. GI bleed-FOBT was positive DVT prophylaxis was held.  GI is not planning for colonoscopy.  Patient received 2 units PRBC.  Transfuse for hemoglobin less than 7.  7. Goals of care-Palliative  care was consulted for goals of care.  Discussed with patient, he wants to continue with hemodialysis, not sure about long-term feeding tube option.  Palliative care is following.    CBG: Recent Labs  Lab 01/30/19 0433 01/30/19 0820 01/30/19 2055 01/31/19 0026 01/31/19 0739  GLUCAP 131* 160* 160* 138* 156*    CBC: Recent Labs  Lab 01/26/19 0500 01/27/19 0611 01/28/19 0415 01/29/19 0415 01/30/19 0417  WBC 9.0 9.3 7.9 10.4 11.4*  NEUTROABS  --   --  6.3  --   --   HGB 7.5* 7.5* 7.0* 7.2* 8.0*  HCT 22.7* 23.0* 22.1* 22.0* 24.3*  MCV 86.0 88.8 90.6 89.4 90.0  PLT 267 235 260 246 [id number]    Basic Metabolic Panel: Recent Labs  Lab 01/25/19 0410 01/26/19 0500 01/27/19 0611 01/28/19 0415 01/29/19 0415 01/30/19 0417 01/31/19 0430  NA 138 138 137 136 136 136 136  K 4.6 3.8 4.3 4.7 4.3 4.3 4.0  CL 100 103 101 101 99 98 101  CO2 28 26 27 26 27 26 25   GLUCOSE 256* 237* 172*  252* 179* 129* 155*  BUN 93* 109* 64* 77* 47* 62* 69*  CREATININE 4.39* 4.51* 3.41* 3.73* 2.71* 3.23* 3.51*  CALCIUM 7.8* 7.4* 7.5* 7.3* 7.4* 7.6* 7.5*  MG 1.9 2.0 1.8 1.8  --   --   --   PHOS  --   --  3.9 4.3 3.8 3.5 3.8    COVID-19 Labs  No results for input(s): DDIMER, FERRITIN, LDH, CRP in the last 72 hours.  Lab Results  Component Value Date   [place] (A)  01/28/2019   SARSCOV2NAA POSITIVE (A) 12/31/2018     DVT prophylaxis: SCDs  Code Status: DNR  Family Communication: No family at bedside  Disposition Plan: likely home when medically ready for discharge        BMI  Estimated body mass index is 18.09 kg/m as calculated from the following:   Height as of this encounter: 5\' 5"  (1.651 m).   Weight as of this encounter: 49.3 kg.  Scheduled medications:  . amLODipine  10 mg Oral Daily  . Chlorhexidine Gluconate Cloth  6 each Topical Q0600  . Chlorhexidine Gluconate Cloth  6 each Topical Q0600  . Chlorhexidine Gluconate Cloth  6 each Topical Q0600  . cloNIDine  0.2 mg Oral BID  . darbepoetin (ARANESP) injection - NON-DIALYSIS  150 mcg Subcutaneous Q Wed-1800  . feeding supplement (ENSURE ENLIVE)  237 mL Oral TID BM  . feeding supplement (VITAL AF 1.2 CAL)  1,000 mL Per Tube Q24H  . folic acid  1 mg Oral Daily  . free water  200 mL Per Tube Q8H  . hydrALAZINE  12.5 mg Oral Q8H  . magnesium oxide  400 mg Oral BID WC  . mouth rinse  15 mL Mouth Rinse BID  . metoprolol tartrate  50 mg Oral BID  . mupirocin ointment   Nasal BID  . pantoprazole  40 mg Oral BID  . [START ON 02/04/2019] predniSONE  10 mg Oral Q breakfast  . [START ON 02/01/2019] predniSONE  20 mg Oral Q breakfast  . sodium chloride flush  3 mL Intravenous Q12H  . sulfamethoxazole-trimethoprim  1 tablet Oral Q M,W,F  . tacrolimus  4 mg Oral BID    Consultants:  Nephrology  Procedures:  Cortrak placement on 11/17   Antibiotics:   Anti-infectives (From admission, onward)   Start     Dose/Rate Route Frequency Ordered Stop   01/28/19 1347  vancomycin (VANCOCIN) 500-5 MG/100ML-% IVPB    Note to Pharmacy: Cherylann Banas   : cabinet override      01/28/19 1347 01/28/19 1615   01/28/19 1200  vancomycin (VANCOCIN) IVPB 500 mg/100 ml premix     500 mg 100 mL/hr over 60 Minutes Intravenous Every Wed (Hemodialysis) 01/28/19 1034 01/28/19 1713   01/27/19 0800   vancomycin (VANCOCIN) 500 mg in sodium chloride 0.9 % 100 mL IVPB     500 mg 100 mL/hr over 60 Minutes Intravenous  Once 01/27/19 0714 01/27/19 1140   01/24/19 1800  ceFEPIme (MAXIPIME) 1 g in sodium chloride 0.9 % 100 mL IVPB     1 g 200 mL/hr over 30 Minutes Intravenous Every 24 hours 01/23/19 1122 01/29/19 1841   01/23/19 1200  ceFEPIme (MAXIPIME) 1 g in sodium chloride 0.9 % 100 mL IVPB     1 g 200 mL/hr over 30 Minutes Intravenous  Once 01/23/19 1122 01/23/19 1452   01/23/19 1130  vancomycin (VANCOCIN) IVPB 1000 mg/200 mL premix     1,000 mg  200 mL/hr over 60 Minutes Intravenous  Once 01/23/19 1122 01/23/19 1623   01/23/19 1123  vancomycin variable dose per unstable renal function (pharmacist dosing)  Status:  Discontinued      Does not apply See admin instructions 01/23/19 1123 01/28/19 1035   01/17/19 1300  remdesivir 100 mg in sodium chloride 0.9 % 250 mL IVPB     100 mg 500 mL/hr over 30 Minutes Intravenous Daily 01/17/19 1144 01/21/19 1106   01/16/19 1800  vancomycin (VANCOCIN) 500 mg in sodium chloride 0.9 % 100 mL IVPB     500 mg 100 mL/hr over 60 Minutes Intravenous  Once 01/13/19 1758 01/16/19 2323   01/13/19 1900  vancomycin (VANCOCIN) IVPB 1000 mg/200 mL premix     1,000 mg 200 mL/hr over 60 Minutes Intravenous  Once 01/13/19 1758 01/13/19 2200   01/12/19 1800  ceFEPIme (MAXIPIME) 1 g in sodium chloride 0.9 % 100 mL IVPB     1 g 200 mL/hr over 30 Minutes Intravenous Every 24 hours 01/11/19 0910 01/17/19 1904   01/12/19 1600  sulfamethoxazole-trimethoprim (BACTRIM) 400-80 MG per tablet 1 tablet    Note to Pharmacy: Take one tablet by mouth on Monday, Wednesday and fridays.     1 tablet Oral Every M-W-F 01/12/19 1449     01/11/19 0830  vancomycin (VANCOCIN) IVPB 1000 mg/200 mL premix  Status:  Discontinued     1,000 mg 200 mL/hr over 60 Minutes Intravenous  Once 01/11/19 0825 01/11/19 0901   01/11/19 0830  ceFEPIme (MAXIPIME) 2 g in sodium chloride 0.9 % 100 mL IVPB      2 g 200 mL/hr over 30 Minutes Intravenous  Once 01/11/19 0825 01/11/19 0952       Objective   Vitals:   01/30/19 2236 01/31/19 0028 01/31/19 0408 01/31/19 0600  BP: (!) 185/75 134/61 (!) 169/64 (!) 170/69  Pulse:  77 79   Resp:  20 20   Temp:  98 F (36.7 C) 98 F (36.7 C)   TempSrc:  Axillary Axillary   SpO2:  99% 100%   Weight:   49.3 kg   Height:        Intake/Output Summary (Last 24 hours) at 01/31/2019 1341 Last data filed at 01/31/2019 1148 Gross per 24 hour  Intake 1172.25 ml  Output 800 ml  Net 372.25 ml   Filed Weights   01/28/19 0500 01/28/19 1410 01/31/19 0408  Weight: 49.3 kg 49 kg 49.3 kg     Physical Examination:  General-appears in no acute distress Heart-S1-S2, regular, no murmur auscultated Lungs-clear to auscultation bilaterally, no wheezing or crackles auscultated Abdomen-soft, nontender, no organomegaly Extremities-no edema in the lower extremities Neuro-alert, oriented x3, no focal deficit noted   Data Reviewed: I have personally reviewed following labs and imaging studies   Recent Results (from the past 240 hour(s))  Culture, blood (routine x 2)     Status: None   Collection Time: 01/23/19 11:00 AM   Specimen: BLOOD RIGHT HAND  Result Value Ref Range Status   Specimen Description BLOOD RIGHT HAND  Final   Special Requests   Final    BOTTLES DRAWN AEROBIC ONLY Blood Culture results may not be optimal due to an inadequate volume of blood received in culture bottles   Culture   Final    NO GROWTH 5 DAYS Performed at [facility], [place] [address], [place], [place] [address]    Report Status 01/28/2019 FINAL  Final  Culture, blood (routine x 2)  Status: None   Collection Time: 01/23/19 11:11 AM   Specimen: BLOOD RIGHT HAND  Result Value Ref Range Status   Specimen Description BLOOD RIGHT HAND  Final   Special Requests   Final    BOTTLES DRAWN AEROBIC ONLY Blood Culture adequate volume   Culture   Final    NO GROWTH 5  DAYS Performed at [facility], [address], [place], [place] [address]    Report Status 01/28/2019 FINAL  Final  MRSA PCR Screening     Status: Abnormal   Collection Time: 01/23/19 11:18 AM   Specimen: Nasal Mucosa; Nasopharyngeal  Result Value Ref Range Status   MRSA by PCR POSITIVE (A) NEGATIVE Final    Comment:        The GeneXpert MRSA Assay (FDA approved for NASAL specimens only), is one component of a comprehensive MRSA colonization surveillance program. It is not intended to diagnose MRSA infection nor to guide or monitor treatment for MRSA infections. RESULT CALLED TO, READ BACK BY AND VERIFIED WITH: Dennison Bulla RN 15:20 01/23/19 ([email or web address]) Performed at [facility], [place] [address], [place], [place] [address]   SARS Coronavirus 2 by RT PCR (hospital order, performed in [facility] hospital lab) Nasopharyngeal Nasopharyngeal Swab     Status: Abnormal   Collection Time: 01/28/19  6:00 PM   Specimen: Nasopharyngeal Swab  Result Value Ref Range Status   SARS Coronavirus 2 POSITIVE (A) NEGATIVE Final    Comment: RESULT CALLED TO, READ BACK BY AND VERIFIED WITH: B RODDY RN 01/28/19 1903 JDW (NOTE) SARS-CoV-2 target nucleic acids are DETECTED SARS-CoV-2 RNA is generally detectable in upper respiratory specimens  during the acute phase of infection.  Positive results are indicative  of the presence of the identified virus, but do not rule out bacterial infection or co-infection with other pathogens not detected by the test.  Clinical correlation with patient history and  other diagnostic information is necessary to determine patient infection status.  The expected result is negative. Fact Sheet for Patients:   StrictlyIdeas.no  Fact Sheet for Healthcare Providers:   BankingDealers.co.za   This test is not yet approved or cleared by the [place] FDA and  has been authorized for detection and/or diagnosis of  SARS-CoV-2 by FDA under an Emergency Use Authorization (EUA).  This EUA will remain in effect (meaning this test can be used ) for the duration of  the COVID-19 declaration under Section 564(b)(1) of the Act, 21 U.S.C. section 360-bbb-3(b)(1), unless the authorization is terminated or revoked sooner. Performed at [facility], [place] [address], [place], [place] [address]      Liver Function Tests: Recent Labs  Lab 01/25/19 0410 01/26/19 0500 01/27/19 [id number] 01/28/19 0415 01/29/19 0415 01/30/19 0417 01/31/19 0430  AST 24 15 17  13*  --   --   --   ALT 33 26 22 19   --   --   --   ALKPHOS 86 85 93 89  --   --   --   BILITOT 1.0 0.8 0.5 0.7  --   --   --   PROT 5.7* 5.6* 5.5* 5.6*  --   --   --   ALBUMIN 1.5* 1.5* 1.7* 1.6* 1.7* 1.7* 1.6*   No results for input(s): LIPASE, AMYLASE in the last 168 hours. No results for input(s): AMMONIA in the last 168 hours.  Cardiac Enzymes: No results for input(s): CKTOTAL, CKMB, CKMBINDEX, TROPONINI in the last 168 hours.  BNP (last 3 results) Recent Labs    01/01/19 0826  BNP 154.8*      Admission status: Inpatient: Based on patients clinical presentation and evaluation of above clinical data, I have made determination that patient meets Inpatient criteria at this time.   [facility] Pager [phone]. If 7PM-7AM, please contact night-coverage at [email or web address], Office  [phone]  password [place]  01/31/2019, 1:41 PM  LOS: 20 days

## 2019-01-31 NOTE — Progress Notes (Addendum)
Palliative: Virtual visit due to Covid.  Chart reviewed, conference with staff.  [name] has a very poor by mouth intake with only 10 to 15% of meals eaten at best.  He continues to be acutely ill and frail, and per bedside staff, he is sleeping most of the day.  Call to niece, Francisco Williamson at [phone], daughter Francisco Williamson is joined to call.  We talked about [name]'s continued poor appetite, poor by mouth intake, sleeping most of the day.  Francisco Williamson shares that she spoke with "[name]"this morning, and tells me that "he wants to come home".   Family states that they understand he had another Covid test recently.  I shared that the Covid test as of 11/25 was still positive.    Family states that they would like him brought home for in-home hospice care when possible.  They shared that they will provide 24 hour care for him.  We talk about HD and that he will not be able to have HD at home, family states understanding and acceptance.  We also talked about tube feeding, that he would also be unburden from tube feeding.  I share that after almost 3 weeks in the Williamson, we are not changing things for [name], and his preference is to be in his own home.  I encourage family to respect what he wants this time to look like, feel like.  Family clearly respects [name] wishes and desires to be in his own home.  We talked about hospice provider, choice offered.  Family request Francisco Williamson care.  We again discussed no HD, no tube feedings.  I share that he would have any pleasure foods that he wanted, and hospice can continue to give medications as he so desires and is able to take them.    We talked about equipment needs.  Francisco Williamson shares that they would need a Williamson bed, but she has a walker and a potty chair.  She states that they can change the bed while he is in it if they need to.  I shared that they are wise to look to future changes, he may quickly decline, be unable to get out of bed.   I share that hospice can bring equipment very quickly, if needed.    Conference with attending, nephrology and social work, related to patient condition, needs, desire for hospice care in home.  Plan:   Patient and family are requesting home with hospice Francisco Williamson).  Family states understanding that he is Covid positive as of 11/25.  They also understand and accept that he would not continue with hemodialysis, not continue with tube feedings.  They are ready and available to provide 24/7 care.  Will need a Williamson bed.       Addendum:   Conference call with Attending and nephrology related to continuation of HD, patient tells them he would like to continue with dialysis, but has told his niece that he expects to return home tomorrow.  Call to patient room at [phone], no answer.    Call to nursing staff at [phone] who shares that [name] is more alert today, but is sleeping now (why he didn't answer the phone).  They share that [name] told them that his wife wants him to come home, but he did not say anything about going home himself.   Nursing staff shares that he waxes and wanes with his desire and abilities to participate in care.  Second call to [name], but no answer.     Call to niece Francisco Williamson to update with [name] stated desire (to staff) that he wants to continue with HD.  Left somewhat detailed message stating that if going home is what is most important to [name], home with hospice would mean no further hemodialysis.  I share that at this point, the medical team needs a more direct answer from [name] about his goals of care.  I shared that I have called [name] twice, but unable to reach him.  It seems that [name] would benefit from continued hemodialysis at this point, continue goals of care discussion about what is most important to him, returning home vs rehab  continuing hemodialysis (and where this will be provided).      65 minutes, extended time  Francisco Axe, NP Palliative Medicine Team Team Phone # [phone] Greater than 50% of this time was spent counseling and coordinating care related to the above assessment and plan.  Due to the nature of this patient's suspected COVID-19 with isolation and in keeping with efforts to prevent the spread of infection and to conserve personal protective equipment, a physical exam was not personally performed.  Patient's symptoms and exam were discussed in detail with the RN.  A chart review of other providers notes and the patient's lab work as well as review of other pertinent studies was performed.  Exam details from prior documentation were reviewed specifically and confirmed with the bedside nurse.  Location of service: [facility]

## 2019-02-01 LAB — GLUCOSE, CAPILLARY
Glucose-Capillary: 116 mg/dL — ABNORMAL HIGH (ref 70–99)
Glucose-Capillary: 212 mg/dL — ABNORMAL HIGH (ref 70–99)
Glucose-Capillary: 165 mg/dL — ABNORMAL HIGH (ref 70–99)
Glucose-Capillary: 168 mg/dL — ABNORMAL HIGH (ref 70–99)
Glucose-Capillary: 140 mg/dL — ABNORMAL HIGH (ref 70–99)

## 2019-02-01 LAB — RENAL FUNCTION PANEL
Albumin: 1.8 g/dL — ABNORMAL LOW (ref 3.5–5.0)
Anion gap: 11 (ref 5–15)
BUN: 32 mg/dL — ABNORMAL HIGH (ref 8–23)
CO2: 24 mmol/L (ref 22–32)
Calcium: 7.7 mg/dL — ABNORMAL LOW (ref 8.9–10.3)
Chloride: 102 mmol/L (ref 98–111)
Creatinine, Ser: 2.58 mg/dL — ABNORMAL HIGH (ref 0.61–1.24)
GFR calc Af Amer: 28 mL/min — ABNORMAL LOW (ref >60)
GFR calc non Af Amer: 24 mL/min — ABNORMAL LOW (ref >60)
Glucose, Bld: 103 mg/dL — ABNORMAL HIGH (ref 70–99)
Phosphorus: 3.7 mg/dL (ref 2.5–4.6)
Potassium: 3.8 mmol/L (ref 3.5–5.1)
Sodium: 137 mmol/L (ref 135–145)

## 2019-02-01 NOTE — Progress Notes (Signed)
Nephrology Progress Note:   Subjective:  He last had HD on 11/28 with 1 kg UF.  He had 2.1 L UOP over 11/28.  States that he would find it ideal to be able to go home but still do dialysis if it is needed.  Previously he worked when on HD and went to [employer] 3rd shift.    Review of systems: appetite not good; no n/v; denies shortness of breath or cp; urinating    Objective Vital signs in last 24 hours: Vitals:   02/01/19 0613 02/01/19 0633 02/01/19 0700 02/01/19 1100  BP: (!) 162/67  (!) 149/61 (!) 143/67  Pulse:   71 71  Resp:   20 20  Temp:   97.9 F (36.6 C) 97.9 F (36.6 C)  TempSrc:   Axillary Axillary  SpO2:   99% 100%  Weight:  48.5 kg    Height:       Weight change: -0.8 kg  Intake/Output Summary (Last 24 hours) at 02/01/2019 1625 Last data filed at 02/01/2019 [id number] Gross per 24 hour  Intake 85 ml  Output 3050 ml  Net -2965 ml   Physical Exam: examined in person on 11/29 with appropriate PPE   General adult male in bed in NAD HEENT normocephalic atraumatic extraocular movements intact sclera anicteric Lungs clear but reduced on exam; room air Heart S1S2 no rub Abdomen soft nontender nondistended Extremities no edema appreciated Psych no agitation  Neuro awake on arrival and answers questions   Summary: Pt is a 73 y.o. yo male s/p renal transplant but with CKD at baseline crt in the 3's who was admitted on 01/11/2019 with weakness after COVID treatment   Assessment/Plan: 1. AKI on advanced CKD-  History of fairly advanced CKD (crt 3's) and renal transplant.  Acute on chronic change due to illness and volume depletion.  Since not thriving we spoke with family and the patient and a decision was made for a trial of HD to see if can clear MS and improve outlook.  BUN/Cr are very low c/w his poor po intake and muscle mass.  - Assess dialysis needs daily.  Nonoliguric.  Follow clearance and for now HD would be per TTS schedule as needed long as aligns with goals of care.  At  this time he expresses that he does wish to continue with dialysis     2. S/p renal transplant-  Is on prograf and cellcept as well as pred taper  - cont for now     3. Anemia- is on procrit normally for anemia as OP-  Supportive therapy - giving ESA and given one unit of blood on 11/20.  Anemia improving pn last check on 11/27. CBC AM  4.  Metabolic acidosis-  Resolved w/ HD  5. HTN/volume-  Added hydralazine 25 mg TID; follow for need to increase; note pred burst   6.   EOL = pall care was consulted     Labs: Basic Metabolic Panel: Recent Labs  Lab 01/30/19 0417 01/31/19 0430 02/01/19 0609  NA 136 136 137  K 4.3 4.0 3.8  CL 98 101 102  CO2 26 25 24   GLUCOSE 129* 155* 103*  BUN 62* 69* 32*  CREATININE 3.23* 3.51* 2.58*  CALCIUM 7.6* 7.5* 7.7*  PHOS 3.5 3.8 3.7   Liver Function Tests: Recent Labs  Lab 01/26/19 0500 01/27/19 0611 01/28/19 0415  01/30/19 0417 01/31/19 0430 02/01/19 0609  AST 15 17 13*  --   --   --   --  ALT 26 22 19   --   --   --   --   ALKPHOS 85 93 89  --   --   --   --   BILITOT 0.8 0.5 0.7  --   --   --   --   PROT 5.6* 5.5* 5.6*  --   --   --   --   ALBUMIN 1.5* 1.7* 1.6*   < > 1.7* 1.6* 1.8*   < > = values in this interval not displayed.    CBC: Recent Labs  Lab 01/26/19 0500 01/27/19 0611 01/28/19 0415 01/29/19 0415 01/30/19 0417  WBC 9.0 9.3 7.9 10.4 11.4*  NEUTROABS  --   --  6.3  --   --   HGB 7.5* 7.5* 7.0* 7.2* 8.0*  HCT 22.7* 23.0* 22.1* 22.0* 24.3*  MCV 86.0 88.8 90.6 89.4 90.0  PLT 267 235 260 246 294    Iron Studies:  Studies/Results: No results found. Medications: Infusions: . sodium chloride 10 mL/hr at 01/28/19 2211  . mycophenolate (CELLCEPT) IV 500 mg (02/01/19 [id number])    Scheduled Medications: . amLODipine  10 mg Oral Daily  . Chlorhexidine Gluconate Cloth  6 each Topical Q0600  . Chlorhexidine Gluconate Cloth  6 each Topical Q0600  . Chlorhexidine Gluconate Cloth  6 each Topical Q0600  . cloNIDine   0.2 mg Oral BID  . darbepoetin (ARANESP) injection - NON-DIALYSIS  150 mcg Subcutaneous Q Wed-1800  . feeding supplement (ENSURE ENLIVE)  237 mL Oral TID BM  . feeding supplement (VITAL AF 1.2 CAL)  1,000 mL Per Tube Q24H  . folic acid  1 mg Oral Daily  . free water  200 mL Per Tube Q8H  . hydrALAZINE  25 mg Oral Q8H  . magnesium oxide  400 mg Oral BID WC  . mouth rinse  15 mL Mouth Rinse BID  . metoprolol tartrate  50 mg Oral BID  . mupirocin ointment   Nasal BID  . pantoprazole  40 mg Oral BID  . [START ON 02/04/2019] predniSONE  10 mg Oral Q breakfast  . predniSONE  20 mg Oral Q breakfast  . sodium chloride flush  3 mL Intravenous Q12H  . sulfamethoxazole-trimethoprim  1 tablet Oral Q M,W,F  . tacrolimus  4 mg Oral BID    have reviewed scheduled and prn medications.   Claudia Desanctis 02/01/2019,4:25 PM

## 2019-02-01 NOTE — Progress Notes (Signed)
[facility]  PROGRESS NOTE  Francisco Williamson [id number] DOB: [birth year] DOA: 01/11/2019 PCP: Default, Provider, MD   Brief HPI:   73 year old male with a history of hypertension, CKDstageV status post renal transplantation, presented with altered mental status.  He was admitted to [facility] from 12/30/2020 11-24 COVID-19 associated pneumonia with acute respiratory failure with hypoxia.  He completed course of remdesivir and steroids and was discharged home on 01/06/2019.  He was brought back to ED given failure to thrive, dehydration, altered mental status and renal failure.  He was initially treated for HCAP.  Worsened around 01/17/2019 and was treated with steroids and remdesivir and plasma.  He was transferred to [facility] for intermittent hemodialysis.  On 01/23/2019 he decompensated again noted mental status and respiratory distress and was restarted on steroids and antibiotics for HCAP.  Patient has improved from respiratory status but continues to have failure to thrive.  Requiring tube feedings with cortrak.  Nephrology is following for dialysis.  Palliative care was consulted for goals of care.    Subjective   Patient seen and examined, denies any complaints.   Assessment/Plan:     1. Acute hypoxic respiratory failure-recurrent sepsis due to HCAP/COVID-19 pneumonia.  Improved.  O2 sats 96- 100% on room air.  Patient was treated with cefepime and vancomycin for 7 days.  Received steroids and remdesivir twice, convalescent plasma 01/17/2019.  2. Acute renal injury on CKD stage V-patient has history of renal transplant, baseline appears 3-4 range.  Nephrology following, plan for hemodialysis on 1123 for renal.  Continue Prograf, CellCept, Bactrim prophylaxis.  3. Failure to thrive-patient is unable to take care of himself at home.  Discharged on 01/06/2019 and readmitted on 01/11/2019.  Now presenting with worsening renal function.  Cortrak Feeding tube placed.  Speech therapy  evaluation obtained, started on dysphagia 1 thin liquid diet on 01/28/2019.  Patient has poor appetite, will hold tube feeding at this time, to see if it improves his appetite..  4. Staph epidermidis UTI-treated with vancomycin.  5. Hypertension-blood pressure stable, continue Modicon, clonidine, as needed labetalol, hydralazine.  6. GI bleed-FOBT was positive DVT prophylaxis was held.  GI is not planning for colonoscopy.  Patient received 2 units PRBC.  Transfuse for hemoglobin less than 7.  7. Goals of care-Palliative  care was consulted for goals of care.  Discussed with patient, he wants to continue with hemodialysis, not sure about long-term feeding tube option.  Palliative care is following.    CBG: Recent Labs  Lab 01/31/19 0739 01/31/19 1611 01/31/19 2028 02/01/19 0735 02/01/19 1147  GLUCAP 156* 222* 123* 116* 212*    CBC: Recent Labs  Lab 01/26/19 0500 01/27/19 0611 01/28/19 0415 01/29/19 0415 01/30/19 0417  WBC 9.0 9.3 7.9 10.4 11.4*  NEUTROABS  --   --  6.3  --   --   HGB 7.5* 7.5* 7.0* 7.2* 8.0*  HCT 22.7* 23.0* 22.1* 22.0* 24.3*  MCV 86.0 88.8 90.6 89.4 90.0  PLT 267 235 260 246 [id number]    Basic Metabolic Panel: Recent Labs  Lab 01/26/19 0500  01/27/19 0611 01/28/19 0415 01/29/19 0415 01/30/19 0417 01/31/19 0430 02/01/19 0609  NA 138  --  137 136 136 136 136 137  K 3.8  --  4.3 4.7 4.3 4.3 4.0 3.8  CL 103  --  101 101 99 98 101 102  CO2 26  --  27 26 27 26 25 24   GLUCOSE 237*  --  172* 252* 179* 129* 155*  103*  BUN 109*  --  64* 77* 47* 62* 69* 32*  CREATININE 4.51*  --  3.41* 3.73* 2.71* 3.23* 3.51* 2.58*  CALCIUM 7.4*  --  7.5* 7.3* 7.4* 7.6* 7.5* 7.7*  MG 2.0  --  1.8 1.8  --   --   --   --   PHOS  --    < > 3.9 4.3 3.8 3.5 3.8 3.7   < > = values in this interval not displayed.    COVID-19 Labs  No results for input(s): DDIMER, FERRITIN, LDH, CRP in the last 72 hours.  Lab Results  Component Value Date   Arroyo (A)  01/28/2019   SARSCOV2NAA POSITIVE (A) 12/31/2018     DVT prophylaxis: SCDs  Code Status: DNR  Family Communication: No family at bedside  Disposition Plan: likely home when medically ready for discharge        BMI  Estimated body mass index is 17.79 kg/m as calculated from the following:   Height as of this encounter: 5\' 5"  (1.651 m).   Weight as of this encounter: 48.5 kg.  Scheduled medications:  . amLODipine  10 mg Oral Daily  . Chlorhexidine Gluconate Cloth  6 each Topical Q0600  . Chlorhexidine Gluconate Cloth  6 each Topical Q0600  . Chlorhexidine Gluconate Cloth  6 each Topical Q0600  . cloNIDine  0.2 mg Oral BID  . darbepoetin (ARANESP) injection - NON-DIALYSIS  150 mcg Subcutaneous Q Wed-1800  . feeding supplement (ENSURE ENLIVE)  237 mL Oral TID BM  . feeding supplement (VITAL AF 1.2 CAL)  1,000 mL Per Tube Q24H  . folic acid  1 mg Oral Daily  . free water  200 mL Per Tube Q8H  . hydrALAZINE  25 mg Oral Q8H  . magnesium oxide  400 mg Oral BID WC  . mouth rinse  15 mL Mouth Rinse BID  . metoprolol tartrate  50 mg Oral BID  . mupirocin ointment   Nasal BID  . pantoprazole  40 mg Oral BID  . [START ON 02/04/2019] predniSONE  10 mg Oral Q breakfast  . predniSONE  20 mg Oral Q breakfast  . sodium chloride flush  3 mL Intravenous Q12H  . sulfamethoxazole-trimethoprim  1 tablet Oral Q M,W,F  . tacrolimus  4 mg Oral BID    Consultants:  Nephrology  Procedures:  Cortrak placement on 11/17   Antibiotics:   Anti-infectives (From admission, onward)   Start     Dose/Rate Route Frequency Ordered Stop   01/28/19 1347  vancomycin (VANCOCIN) 500-5 MG/100ML-% IVPB    Note to Pharmacy: Cherylann Banas   : cabinet override      01/28/19 1347 01/28/19 1615   01/28/19 1200  vancomycin (VANCOCIN) IVPB 500 mg/100 ml premix     500 mg 100 mL/hr over 60 Minutes Intravenous Every Wed (Hemodialysis) 01/28/19 1034 01/28/19 1713   01/27/19 0800  vancomycin (VANCOCIN) 500  mg in sodium chloride 0.9 % 100 mL IVPB     500 mg 100 mL/hr over 60 Minutes Intravenous  Once 01/27/19 0714 01/27/19 1140   01/24/19 1800  ceFEPIme (MAXIPIME) 1 g in sodium chloride 0.9 % 100 mL IVPB     1 g 200 mL/hr over 30 Minutes Intravenous Every 24 hours 01/23/19 1122 01/29/19 1841   01/23/19 1200  ceFEPIme (MAXIPIME) 1 g in sodium chloride 0.9 % 100 mL IVPB     1 g 200 mL/hr over 30 Minutes Intravenous  Once 01/23/19  1122 01/23/19 1452   01/23/19 1130  vancomycin (VANCOCIN) IVPB 1000 mg/200 mL premix     1,000 mg 200 mL/hr over 60 Minutes Intravenous  Once 01/23/19 1122 01/23/19 1623   01/23/19 1123  vancomycin variable dose per unstable renal function (pharmacist dosing)  Status:  Discontinued      Does not apply See admin instructions 01/23/19 1123 01/28/19 1035   01/17/19 1300  remdesivir 100 mg in sodium chloride 0.9 % 250 mL IVPB     100 mg 500 mL/hr over 30 Minutes Intravenous Daily 01/17/19 1144 01/21/19 1106   01/16/19 1800  vancomycin (VANCOCIN) 500 mg in sodium chloride 0.9 % 100 mL IVPB     500 mg 100 mL/hr over 60 Minutes Intravenous  Once 01/13/19 1758 01/16/19 2323   01/13/19 1900  vancomycin (VANCOCIN) IVPB 1000 mg/200 mL premix     1,000 mg 200 mL/hr over 60 Minutes Intravenous  Once 01/13/19 1758 01/13/19 2200   01/12/19 1800  ceFEPIme (MAXIPIME) 1 g in sodium chloride 0.9 % 100 mL IVPB     1 g 200 mL/hr over 30 Minutes Intravenous Every 24 hours 01/11/19 0910 01/17/19 1904   01/12/19 1600  sulfamethoxazole-trimethoprim (BACTRIM) 400-80 MG per tablet 1 tablet    Note to Pharmacy: Take one tablet by mouth on Monday, Wednesday and fridays.     1 tablet Oral Every M-W-F 01/12/19 1449     01/11/19 0830  vancomycin (VANCOCIN) IVPB 1000 mg/200 mL premix  Status:  Discontinued     1,000 mg 200 mL/hr over 60 Minutes Intravenous  Once 01/11/19 0825 01/11/19 0901   01/11/19 0830  ceFEPIme (MAXIPIME) 2 g in sodium chloride 0.9 % 100 mL IVPB     2 g 200 mL/hr over 30  Minutes Intravenous  Once 01/11/19 0825 01/11/19 0952       Objective   Vitals:   02/01/19 0613 02/01/19 0633 02/01/19 0700 02/01/19 1100  BP: (!) 162/67  (!) 149/61 (!) 143/67  Pulse:   71 71  Resp:   20 20  Temp:   97.9 F (36.6 C) 97.9 F (36.6 C)  TempSrc:   Axillary Axillary  SpO2:   99% 100%  Weight:  48.5 kg    Height:        Intake/Output Summary (Last 24 hours) at 02/01/2019 1458 Last data filed at 02/01/2019 [id number] Gross per 24 hour  Intake 85 ml  Output 3050 ml  Net -2965 ml   Filed Weights   01/31/19 0408 01/31/19 2358 02/01/19 0633  Weight: 49.3 kg 48.5 kg 48.5 kg     Physical Examination:  General-appears lethargic Heart-S1-S2, regular, no murmur auscultated Lungs-clear to auscultation bilaterally, no wheezing or crackles auscultated Abdomen-soft, nontender, no organomegaly Extremities-no edema in the lower extremities Neuro-alert, oriented x3, no focal deficit noted  Data Reviewed: I have personally reviewed following labs and imaging studies   Recent Results (from the past 240 hour(s))  Culture, blood (routine x 2)     Status: None   Collection Time: 01/23/19 11:00 AM   Specimen: BLOOD RIGHT HAND  Result Value Ref Range Status   Specimen Description BLOOD RIGHT HAND  Final   Special Requests   Final    BOTTLES DRAWN AEROBIC ONLY Blood Culture results may not be optimal due to an inadequate volume of blood received in culture bottles   Culture   Final    NO GROWTH 5 DAYS Performed at [facility], [address],  [place] [address]    Report Status 01/28/2019 FINAL  Final  Culture, blood (routine x 2)     Status: None   Collection Time: 01/23/19 11:11 AM   Specimen: BLOOD RIGHT HAND  Result Value Ref Range Status   Specimen Description BLOOD RIGHT HAND  Final   Special Requests   Final    BOTTLES DRAWN AEROBIC ONLY Blood Culture adequate volume   Culture   Final    NO GROWTH 5 DAYS Performed at [facility],  [place] [address], [place], [place] [address]    Report Status 01/28/2019 FINAL  Final  MRSA PCR Screening     Status: Abnormal   Collection Time: 01/23/19 11:18 AM   Specimen: Nasal Mucosa; Nasopharyngeal  Result Value Ref Range Status   MRSA by PCR POSITIVE (A) NEGATIVE Final    Comment:        The GeneXpert MRSA Assay (FDA approved for NASAL specimens only), is one component of a comprehensive MRSA colonization surveillance program. It is not intended to diagnose MRSA infection nor to guide or monitor treatment for MRSA infections. RESULT CALLED TO, READ BACK BY AND VERIFIED WITH: Dennison Bulla RN 15:20 01/23/19 ([email or web address]) Performed at [facility], [place] [address], [place], [place] [address]   SARS Coronavirus 2 by RT PCR (hospital order, performed in [facility] hospital lab) Nasopharyngeal Nasopharyngeal Swab     Status: Abnormal   Collection Time: 01/28/19  6:00 PM   Specimen: Nasopharyngeal Swab  Result Value Ref Range Status   SARS Coronavirus 2 POSITIVE (A) NEGATIVE Final    Comment: RESULT CALLED TO, READ BACK BY AND VERIFIED WITH: B RODDY RN 01/28/19 1903 JDW (NOTE) SARS-CoV-2 target nucleic acids are DETECTED SARS-CoV-2 RNA is generally detectable in upper respiratory specimens  during the acute phase of infection.  Positive results are indicative  of the presence of the identified virus, but do not rule out bacterial infection or co-infection with other pathogens not detected by the test.  Clinical correlation with patient history and  other diagnostic information is necessary to determine patient infection status.  The expected result is negative. Fact Sheet for Patients:   StrictlyIdeas.no  Fact Sheet for Healthcare Providers:   BankingDealers.co.za   This test is not yet approved or cleared by the [place] FDA and  has been authorized for detection and/or diagnosis of SARS-CoV-2 by FDA under an Emergency Use  Authorization (EUA).  This EUA will remain in effect (meaning this test can be used ) for the duration of  the COVID-19 declaration under Section 564(b)(1) of the Act, 21 U.S.C. section 360-bbb-3(b)(1), unless the authorization is terminated or revoked sooner. Performed at [facility], [place] [address] [place], [place] [address]      Liver Function Tests: Recent Labs  Lab 01/26/19 0500 01/27/19 [id number] 01/28/19 0415 01/29/19 0415 01/30/19 0417 01/31/19 0430 02/01/19 0609  AST 15 17 13*  --   --   --   --   ALT 26 22 19   --   --   --   --   ALKPHOS 85 93 89  --   --   --   --   BILITOT 0.8 0.5 0.7  --   --   --   --   PROT 5.6* 5.5* 5.6*  --   --   --   --   ALBUMIN 1.5* 1.7* 1.6* 1.7* 1.7* 1.6* 1.8*   No results for input(s): LIPASE, AMYLASE  in the last 168 hours. No results for input(s): AMMONIA in the last 168 hours.  Cardiac Enzymes: No results for input(s): CKTOTAL, CKMB, CKMBINDEX, TROPONINI in the last 168 hours. BNP (last 3 results) Recent Labs    01/01/19 0826  BNP 154.8*      Admission status: Inpatient: Based on patients clinical presentation and evaluation of above clinical data, I have made determination that patient meets Inpatient criteria at this time.   [facility] Pager [phone]. If 7PM-7AM, please contact night-coverage at [email or web address], Office  [phone]  password [place]  02/01/2019, 2:58 PM  LOS: 21 days

## 2019-02-02 LAB — RENAL FUNCTION PANEL
Albumin: 1.7 g/dL — ABNORMAL LOW (ref 3.5–5.0)
Anion gap: 9 (ref 5–15)
BUN: 50 mg/dL — ABNORMAL HIGH (ref 8–23)
CO2: 26 mmol/L (ref 22–32)
Calcium: 7.6 mg/dL — ABNORMAL LOW (ref 8.9–10.3)
Chloride: 100 mmol/L (ref 98–111)
Creatinine, Ser: 3.69 mg/dL — ABNORMAL HIGH (ref 0.61–1.24)
GFR calc Af Amer: 18 mL/min — ABNORMAL LOW (ref >60)
GFR calc non Af Amer: 16 mL/min — ABNORMAL LOW (ref >60)
Glucose, Bld: 143 mg/dL — ABNORMAL HIGH (ref 70–99)
Phosphorus: 3.8 mg/dL (ref 2.5–4.6)
Potassium: 3.9 mmol/L (ref 3.5–5.1)
Sodium: 135 mmol/L (ref 135–145)

## 2019-02-02 LAB — GLUCOSE, CAPILLARY
Glucose-Capillary: 219 mg/dL — ABNORMAL HIGH (ref 70–99)
Glucose-Capillary: 224 mg/dL — ABNORMAL HIGH (ref 70–99)
Glucose-Capillary: 157 mg/dL — ABNORMAL HIGH (ref 70–99)
Glucose-Capillary: 144 mg/dL — ABNORMAL HIGH (ref 70–99)
Glucose-Capillary: 151 mg/dL — ABNORMAL HIGH (ref 70–99)
Glucose-Capillary: 218 mg/dL — ABNORMAL HIGH (ref 70–99)

## 2019-02-02 LAB — CBC
HCT: 22.8 % — ABNORMAL LOW (ref 39.0–52.0)
Hemoglobin: 7.4 g/dL — ABNORMAL LOW (ref 13.0–17.0)
MCH: 29.7 pg (ref 26.0–34.0)
MCHC: 32.5 g/dL (ref 30.0–36.0)
MCV: 91.6 fL (ref 80.0–100.0)
Platelets: 228 10*3/uL (ref 150–400)
RBC: 2.49 MIL/uL — ABNORMAL LOW (ref 4.22–5.81)
RDW: 17 % — ABNORMAL HIGH (ref 11.5–15.5)
WBC: 7.2 10*3/uL (ref 4.0–10.5)
nRBC: 0.3 % — ABNORMAL HIGH (ref 0.0–0.2)

## 2019-02-02 LAB — MAGNESIUM: Magnesium: 2 mg/dL (ref 1.7–2.4)

## 2019-02-02 MED ORDER — DEXTROSE 5 % IV BOLUS
10.0000 mL | Freq: Two times a day (BID) | INTRAVENOUS | Status: DC
  Administered 2019-02-02 – 2019-02-04 (×4): 10 mL via INTRAVENOUS

## 2019-02-02 MED ORDER — DEXTROSE 5 % IV BOLUS: 10.0000 mL | INTRAVENOUS | Status: DC

## 2019-02-02 MED ORDER — DEXTROSE 5 % IV BOLUS
10.0000 mL | Freq: Two times a day (BID) | INTRAVENOUS | Status: DC
  Administered 2019-02-03 – 2019-02-04 (×2): 10 mL via INTRAVENOUS

## 2019-02-02 NOTE — Progress Notes (Signed)
Patient ID: Kailum Ouimette, male   DOB: August 18, 1945, 73 y.o.   MRN: [id number] S: UOP has dropped over the last 24 hours O:BP (!) 167/70   Pulse 71   Temp 98.9 F (37.2 C) (Axillary)   Resp (!) 22   Ht 5\' 5"  (1.651 m)   Wt 47.7 kg   SpO2 97%   BMI 17.50 kg/m   Intake/Output Summary (Last 24 hours) at 02/02/2019 1625 Last data filed at 02/01/2019 2300 Gross per 24 hour  Intake -  Output 400 ml  Net -400 ml   Intake/Output: I/O last 3 completed shifts: In: 53 [IV Piggyback:85] Out: 83 [Urine:2150; Other:1000]  Intake/Output this shift:  No intake/output data recorded. Weight change: -0.8 kg Physical exam: unable to complete due to COVID + status.  In order to preserve PPE equipment and to minimize exposure to providers.  Notes from other caregivers reviewed   Recent Labs  Lab 01/27/19 0611 01/28/19 0415 01/29/19 0415 01/30/19 0417 01/31/19 0430 02/01/19 0609 02/02/19 0559  NA 137 136 136 136 136 137 135  K 4.3 4.7 4.3 4.3 4.0 3.8 3.9  CL 101 101 99 98 101 102 100  CO2 27 26 27 26 25 24 26   GLUCOSE 172* 252* 179* 129* 155* 103* 143*  BUN 64* 77* 47* 62* 69* 32* 50*  CREATININE 3.41* 3.73* 2.71* 3.23* 3.51* 2.58* 3.69*  ALBUMIN 1.7* 1.6* 1.7* 1.7* 1.6* 1.8* 1.7*  CALCIUM 7.5* 7.3* 7.4* 7.6* 7.5* 7.7* 7.6*  PHOS 3.9 4.3 3.8 3.5 3.8 3.7 3.8  AST 17 13*  --   --   --   --   --   ALT 22 19  --   --   --   --   --    Liver Function Tests: Recent Labs  Lab 01/27/19 0611 01/28/19 0415  01/31/19 0430 02/01/19 0609 02/02/19 0559  AST 17 13*  --   --   --   --   ALT 22 19  --   --   --   --   ALKPHOS 93 89  --   --   --   --   BILITOT 0.5 0.7  --   --   --   --   PROT 5.5* 5.6*  --   --   --   --   ALBUMIN 1.7* 1.6*   < > 1.6* 1.8* 1.7*   < > = values in this interval not displayed.   No results for input(s): LIPASE, AMYLASE in the last 168 hours. No results for input(s): AMMONIA in the last 168 hours. CBC: Recent Labs  Lab 01/27/19 0611 01/28/19 0415  01/29/19 0415 01/30/19 0417 02/02/19 0559  WBC 9.3 7.9 10.4 11.4* 7.2  NEUTROABS  --  6.3  --   --   --   HGB 7.5* 7.0* 7.2* 8.0* 7.4*  HCT 23.0* 22.1* 22.0* 24.3* 22.8*  MCV 88.8 90.6 89.4 90.0 91.6  PLT 235 260 246 294 228   Cardiac Enzymes: No results for input(s): CKTOTAL, CKMB, CKMBINDEX, TROPONINI in the last 168 hours. CBG: Recent Labs  Lab 02/01/19 2046 02/01/19 2322 02/02/19 0321 02/02/19 0753 02/02/19 1142  GLUCAP 168* 140* 144* 151* 218*    Iron Studies: No results for input(s): IRON, TIBC, TRANSFERRIN, FERRITIN in the last 72 hours. Studies/Results: No results found. Marland Kitchen amLODipine  10 mg Oral Daily  . Chlorhexidine Gluconate Cloth  6 each Topical Q0600  . Chlorhexidine Gluconate Cloth  6 each Topical  [id number]  . Chlorhexidine Gluconate Cloth  6 each Topical Q0600  . cloNIDine  0.2 mg Oral BID  . darbepoetin (ARANESP) injection - NON-DIALYSIS  150 mcg Subcutaneous Q Wed-1800  . feeding supplement (ENSURE ENLIVE)  237 mL Oral TID BM  . feeding supplement (VITAL AF 1.2 CAL)  1,000 mL Per Tube Q24H  . folic acid  1 mg Oral Daily  . free water  200 mL Per Tube Q8H  . hydrALAZINE  25 mg Oral Q8H  . magnesium oxide  400 mg Oral BID WC  . mouth rinse  15 mL Mouth Rinse BID  . metoprolol tartrate  50 mg Oral BID  . mupirocin ointment   Nasal BID  . pantoprazole  40 mg Oral BID  . [START ON 02/04/2019] predniSONE  10 mg Oral Q breakfast  . predniSONE  20 mg Oral Q breakfast  . sodium chloride flush  3 mL Intravenous Q12H  . sulfamethoxazole-trimethoprim  1 tablet Oral Q M,W,F  . tacrolimus  4 mg Oral BID    BMET    Component Value Date/Time   NA 135 02/02/2019 0559   K 3.9 02/02/2019 0559   CL 100 02/02/2019 0559   CO2 26 02/02/2019 0559   GLUCOSE 143 (H) 02/02/2019 0559   BUN 50 (H) 02/02/2019 0559   CREATININE 3.69 (H) 02/02/2019 0559   CALCIUM 7.6 (L) 02/02/2019 0559   CALCIUM 8.7 12/19/2018 1428   GFRNONAA 16 (L) 02/02/2019 0559   GFRAA 18 (L)  02/02/2019 0559   CBC    Component Value Date/Time   WBC 7.2 02/02/2019 0559   RBC 2.49 (L) 02/02/2019 0559   HGB 7.4 (L) 02/02/2019 0559   HCT 22.8 (L) 02/02/2019 0559   PLT 228 02/02/2019 0559   MCV 91.6 02/02/2019 0559   MCH 29.7 02/02/2019 0559   MCHC 32.5 02/02/2019 0559   RDW 17.0 (H) 02/02/2019 0559   LYMPHSABS 0.8 01/28/2019 0415   MONOABS 0.7 01/28/2019 0415   EOSABS 0.0 01/28/2019 0415   BASOSABS 0.0 01/28/2019 0415     Assessment/Plan:  1. 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  Last Cr was up from 4 in July to 4.6 in October.  Felt to be due to acute illness and volume depletion.  He has had progressive azotemia and undergone 5 HD sessions with improvement.  He is willing to continue with IHD if needed. However he has had some FTT over the past few months.  UOP dropping off.  Will plan for another session of HD tomorrow. 2. Covid 19 infection- admitted to [facility] 10/28-11/3 for covid 19 PNA and acute respiratory failure with hypoxia.  He completed course of remdesivir and steroids but continued to c/o fatigue and anorexia with FTT.  Now on room air 3. H/o kidney transplant in April [id number] at [facility] complicated by polyoma virus and chronic allograft nephropathy. 4. FTT- agree with palliative care consult for goals/limits of care 5. HTN- stable 6. GIB- +FOBT.  Follow h/h  7. Severe protein malnutrition- on supplements.  Donetta Potts, MD [employer] [phone]

## 2019-02-02 NOTE — Progress Notes (Signed)
[facility]  PROGRESS NOTE  Francisco Williamson [id number] DOB: [birth year] DOA: 01/11/2019 PCP: Default, Provider, MD   Brief HPI:   73 year old male with a history of hypertension, CKDstageV status post renal transplantation, presented with altered mental status.  He was admitted to [facility] from 12/30/2020 11-24 COVID-19 associated pneumonia with acute respiratory failure with hypoxia.  He completed course of remdesivir and steroids and was discharged home on 01/06/2019.  He was brought back to ED given failure to thrive, dehydration, altered mental status and renal failure.  He was initially treated for HCAP.  Worsened around 01/17/2019 and was treated with steroids and remdesivir and plasma.  He was transferred to [facility] for intermittent hemodialysis.  On 01/23/2019 he decompensated again noted mental status and respiratory distress and was restarted on steroids and antibiotics for HCAP.  Patient has improved from respiratory status but continues to have failure to thrive.  Requiring tube feedings with cortrak.  Nephrology is following for dialysis.  Palliative care was consulted for goals of care.    Subjective   Patient seen and examined, more alert today.   Assessment/Plan:     1. Acute hypoxic respiratory failure-recurrent sepsis due to HCAP/COVID-19 pneumonia.  Improved.  O2 sats 96- 100% on room air.  Patient was treated with cefepime and vancomycin for 7 days.  Received steroids and remdesivir twice, convalescent plasma 01/17/2019.  2. Acute renal injury on CKD stage V-patient has history of renal transplant, baseline appears 3-4 range.  Nephrology following, plan for hemodialysis on 1123 for renal.  Continue Prograf, CellCept, Bactrim prophylaxis.  3. Failure to thrive-patient is unable to take care of himself at home.  Discharged on 01/06/2019 and readmitted on 01/11/2019.  Now presenting with worsening renal function.  Cortrak Feeding tube placed.  Speech therapy  evaluation obtained, started on dysphagia 1 thin liquid diet on 01/28/2019.  Patient has poor appetite, will hold tube feeding at this time, to see if it improves his appetite..  4. Staph epidermidis UTI-treated with vancomycin.  5. Hypertension-blood pressure stable, continue Modicon, clonidine, as needed labetalol, hydralazine.  6. GI bleed-FOBT was positive DVT prophylaxis was held.  GI is not planning for colonoscopy.  Patient received 2 units PRBC.  Transfuse for hemoglobin less than 7.  7. Goals of care-Palliative  care was consulted for goals of care.  Discussed with patient, he wants to continue with hemodialysis, not sure about long-term feeding tube option.  Palliative care is following.    CBG: Recent Labs  Lab 02/01/19 2046 02/01/19 2322 02/02/19 0321 02/02/19 0753 02/02/19 1142  GLUCAP 168* 140* 144* 151* 218*    CBC: Recent Labs  Lab 01/27/19 0611 01/28/19 0415 01/29/19 0415 01/30/19 0417 02/02/19 0559  WBC 9.3 7.9 10.4 11.4* 7.2  NEUTROABS  --  6.3  --   --   --   HGB 7.5* 7.0* 7.2* 8.0* 7.4*  HCT 23.0* 22.1* 22.0* 24.3* 22.8*  MCV 88.8 90.6 89.4 90.0 91.6  PLT 235 260 246 294 [id number]    Basic Metabolic Panel: Recent Labs  Lab 01/27/19 0611 01/28/19 0415 01/29/19 0415 01/30/19 0417 01/31/19 0430 02/01/19 0609 02/02/19 0559  NA 137 136 136 136 136 137 135  K 4.3 4.7 4.3 4.3 4.0 3.8 3.9  CL 101 101 99 98 101 102 100  CO2 27 26 27 26 25 24 26   GLUCOSE 172* 252* 179* 129* 155* 103* 143*  BUN 64* 77* 47* 62* 69* 32* 50*  CREATININE 3.41* 3.73* 2.71* 3.23*  3.51* 2.58* 3.69*  CALCIUM 7.5* 7.3* 7.4* 7.6* 7.5* 7.7* 7.6*  MG 1.8 1.8  --   --   --   --  2.0  PHOS 3.9 4.3 3.8 3.5 3.8 3.7 3.8    COVID-19 Labs  No results for input(s): DDIMER, FERRITIN, LDH, CRP in the last 72 hours.  Lab Results  Component Value Date   [place] (A) 01/28/2019   [place] (A) 12/31/2018     DVT prophylaxis: SCDs  Code Status: DNR  Family  Communication: No family at bedside  Disposition Plan: likely skilled nursing facility in the next few days.        BMI  Estimated body mass index is 17.5 kg/m as calculated from the following:   Height as of this encounter: 5\' 5"  (1.651 m).   Weight as of this encounter: 47.7 kg.  Scheduled medications:  . amLODipine  10 mg Oral Daily  . Chlorhexidine Gluconate Cloth  6 each Topical Q0600  . Chlorhexidine Gluconate Cloth  6 each Topical Q0600  . Chlorhexidine Gluconate Cloth  6 each Topical Q0600  . cloNIDine  0.2 mg Oral BID  . darbepoetin (ARANESP) injection - NON-DIALYSIS  150 mcg Subcutaneous Q Wed-1800  . feeding supplement (ENSURE ENLIVE)  237 mL Oral TID BM  . feeding supplement (VITAL AF 1.2 CAL)  1,000 mL Per Tube Q24H  . folic acid  1 mg Oral Daily  . free water  200 mL Per Tube Q8H  . hydrALAZINE  25 mg Oral Q8H  . magnesium oxide  400 mg Oral BID WC  . mouth rinse  15 mL Mouth Rinse BID  . metoprolol tartrate  50 mg Oral BID  . mupirocin ointment   Nasal BID  . pantoprazole  40 mg Oral BID  . [START ON 02/04/2019] predniSONE  10 mg Oral Q breakfast  . predniSONE  20 mg Oral Q breakfast  . sodium chloride flush  3 mL Intravenous Q12H  . sulfamethoxazole-trimethoprim  1 tablet Oral Q M,W,F  . tacrolimus  4 mg Oral BID    Consultants:  Nephrology  Procedures:  Cortrak placement on 11/17   Antibiotics:   Anti-infectives (From admission, onward)   Start     Dose/Rate Route Frequency Ordered Stop   01/28/19 1347  vancomycin (VANCOCIN) 500-5 MG/100ML-% IVPB    Note to Pharmacy: Cherylann Banas   : cabinet override      01/28/19 1347 01/28/19 1615   01/28/19 1200  vancomycin (VANCOCIN) IVPB 500 mg/100 ml premix     500 mg 100 mL/hr over 60 Minutes Intravenous Every Wed (Hemodialysis) 01/28/19 1034 01/28/19 1713   01/27/19 0800  vancomycin (VANCOCIN) 500 mg in sodium chloride 0.9 % 100 mL IVPB     500 mg 100 mL/hr over 60 Minutes Intravenous  Once  01/27/19 0714 01/27/19 1140   01/24/19 1800  ceFEPIme (MAXIPIME) 1 g in sodium chloride 0.9 % 100 mL IVPB     1 g 200 mL/hr over 30 Minutes Intravenous Every 24 hours 01/23/19 1122 01/29/19 1841   01/23/19 1200  ceFEPIme (MAXIPIME) 1 g in sodium chloride 0.9 % 100 mL IVPB     1 g 200 mL/hr over 30 Minutes Intravenous  Once 01/23/19 1122 01/23/19 1452   01/23/19 1130  vancomycin (VANCOCIN) IVPB 1000 mg/200 mL premix     1,000 mg 200 mL/hr over 60 Minutes Intravenous  Once 01/23/19 1122 01/23/19 1623   01/23/19 1123  vancomycin variable dose per unstable  renal function (pharmacist dosing)  Status:  Discontinued      Does not apply See admin instructions 01/23/19 1123 01/28/19 1035   01/17/19 1300  remdesivir 100 mg in sodium chloride 0.9 % 250 mL IVPB     100 mg 500 mL/hr over 30 Minutes Intravenous Daily 01/17/19 1144 01/21/19 1106   01/16/19 1800  vancomycin (VANCOCIN) 500 mg in sodium chloride 0.9 % 100 mL IVPB     500 mg 100 mL/hr over 60 Minutes Intravenous  Once 01/13/19 1758 01/16/19 2323   01/13/19 1900  vancomycin (VANCOCIN) IVPB 1000 mg/200 mL premix     1,000 mg 200 mL/hr over 60 Minutes Intravenous  Once 01/13/19 1758 01/13/19 2200   01/12/19 1800  ceFEPIme (MAXIPIME) 1 g in sodium chloride 0.9 % 100 mL IVPB     1 g 200 mL/hr over 30 Minutes Intravenous Every 24 hours 01/11/19 0910 01/17/19 1904   01/12/19 1600  sulfamethoxazole-trimethoprim (BACTRIM) 400-80 MG per tablet 1 tablet    Note to Pharmacy: Take one tablet by mouth on Monday, Wednesday and fridays.     1 tablet Oral Every M-W-F 01/12/19 1449     01/11/19 0830  vancomycin (VANCOCIN) IVPB 1000 mg/200 mL premix  Status:  Discontinued     1,000 mg 200 mL/hr over 60 Minutes Intravenous  Once 01/11/19 0825 01/11/19 0901   01/11/19 0830  ceFEPIme (MAXIPIME) 2 g in sodium chloride 0.9 % 100 mL IVPB     2 g 200 mL/hr over 30 Minutes Intravenous  Once 01/11/19 0825 01/11/19 0952       Objective   Vitals:   02/02/19  0648 02/02/19 0754 02/02/19 0903 02/02/19 1333  BP: (!) 154/65 (!) 174/63  (!) 167/70  Pulse:  80 71   Resp:  (!) 23 (!) 22   Temp:  98.9 F (37.2 C)    TempSrc:  Axillary    SpO2:  97% 97%   Weight:      Height:        Intake/Output Summary (Last 24 hours) at 02/02/2019 1613 Last data filed at 02/01/2019 2300 Gross per 24 hour  Intake -  Output 400 ml  Net -400 ml   Filed Weights   01/31/19 2358 02/01/19 0633 02/02/19 0500  Weight: 48.5 kg 48.5 kg 47.7 kg     Physical Examination:  General-appears in no acute distress Heart-S1-S2, regular, no murmur auscultated Lungs-clear to auscultation bilaterally, no wheezing or crackles auscultated Abdomen-soft, nontender, no organomegaly Extremities-no edema in the lower extremities Neuro-alert, oriented x3, no focal deficit noted  Data Reviewed: I have personally reviewed following labs and imaging studies   Recent Results (from the past 240 hour(s))  SARS Coronavirus 2 by RT PCR (hospital order, performed in [place] hospital lab) Nasopharyngeal Nasopharyngeal Swab     Status: Abnormal   Collection Time: 01/28/19  6:00 PM   Specimen: Nasopharyngeal Swab  Result Value Ref Range Status   SARS Coronavirus 2 POSITIVE (A) NEGATIVE Final    Comment: RESULT CALLED TO, READ BACK BY AND VERIFIED WITH: B RODDY RN 01/28/19 1903 JDW (NOTE) SARS-CoV-2 target nucleic acids are DETECTED SARS-CoV-2 RNA is generally detectable in upper respiratory specimens  during the acute phase of infection.  Positive results are indicative  of the presence of the identified virus, but do not rule out bacterial infection or co-infection with other pathogens not detected by the test.  Clinical correlation with patient history and  other diagnostic information is necessary to determine  patient infection status.  The expected result is negative. Fact Sheet for Patients:   StrictlyIdeas.no  Fact Sheet for Healthcare  Providers:   BankingDealers.co.za   This test is not yet approved or cleared by the [place] FDA and  has been authorized for detection and/or diagnosis of SARS-CoV-2 by FDA under an Emergency Use Authorization (EUA).  This EUA will remain in effect (meaning this test can be used ) for the duration of  the COVID-19 declaration under Section 564(b)(1) of the Act, 21 U.S.C. section 360-bbb-3(b)(1), unless the authorization is terminated or revoked sooner. Performed at [facility], [place] [address] [place], [place] [address]      Liver Function Tests: Recent Labs  Lab 01/27/19 [phone] 01/28/19 0415 01/29/19 0415 01/30/19 0417 01/31/19 0430 02/01/19 0609 02/02/19 0559  AST 17 13*  --   --   --   --   --   ALT 22 19  --   --   --   --   --   ALKPHOS 93 89  --   --   --   --   --   BILITOT 0.5 0.7  --   --   --   --   --   PROT 5.5* 5.6*  --   --   --   --   --   ALBUMIN 1.7* 1.6* 1.7* 1.7* 1.6* 1.8* 1.7*   No results for input(s): LIPASE, AMYLASE in the last 168 hours. No results for input(s): AMMONIA in the last 168 hours.  Cardiac Enzymes: No results for input(s): CKTOTAL, CKMB, CKMBINDEX, TROPONINI in the last 168 hours. BNP (last 3 results) Recent Labs    01/01/19 0826  BNP 154.8*      Admission status: Inpatient: Based on patients clinical presentation and evaluation of above clinical data, I have made determination that patient meets Inpatient criteria at this time.   [facility] Pager [phone]. If 7PM-7AM, please contact night-coverage at [email or web address], Office  [phone]  password [place]  02/02/2019, 4:13 PM  LOS: 22 days

## 2019-02-02 NOTE — Progress Notes (Signed)
CSW following for discharge needs. CSW notes SNF consult, however, awaiting goals of care decision if patient wants to move forward with dialysis and SNF placement versus home with family. Cortrak will need to be addressed prior to SNF placement.   Percell Locus Kasai Beltran LCSW [phone]

## 2019-02-02 NOTE — Progress Notes (Signed)
Physical Therapy Treatment Patient Details Name: Francisco Williamson MRN: [id number] DOB: January 03, 1946 Today's Date: 02/02/2019    History of Present Illness 73 yo male presenting with AMS and failure to thrive, renal failure. Pt transferred to [facility] and HD initiated 11/19. Pt was admitted to [facility] from 10/28-11/3 for COVID + and PNA. 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    PT Comments    Pt more engaged today but remains very weak and frail. Continue to recommend SNF unless home with hospice.    Follow Up Recommendations  SNF     Equipment Recommendations  None recommended by PT    Recommendations for Other Services       Precautions / Restrictions Precautions Precautions: Fall Restrictions Weight Bearing Restrictions: No    Mobility  Bed Mobility Overal bed mobility: Needs Assistance Bed Mobility: Supine to Sit     Supine to sit: HOB elevated;Min assist     General bed mobility comments: Assist to elevate trunk into sitting.   Transfers Overall transfer level: Needs assistance Equipment used: Rolling walker (2 wheeled) Transfers: Sit to/from [facility] Sit to Stand: Min assist Stand pivot transfers: Min assist       General transfer comment: Assist to bring hips up and for balance. Bed to chair with walker  Ambulation/Gait Ambulation/Gait assistance: Min assist Gait Distance (Feet): 25 Feet Assistive device: Rolling walker (2 wheeled) Gait Pattern/deviations: Decreased step length - right;Decreased step length - left;Step-through pattern;Shuffle;Trunk flexed;Narrow base of support Gait velocity: decr Gait velocity interpretation: <1.31 ft/sec, indicative of household ambulator General Gait Details: Assist for balance.    Stairs             Wheelchair Mobility    Modified Rankin (Stroke Patients Only)       Balance Overall balance assessment: Needs assistance Sitting-balance support: Feet  supported;No upper extremity supported Sitting balance-Leahy Scale: Fair     Standing balance support: Bilateral upper extremity supported Standing balance-Leahy Scale: Poor Standing balance comment: walker and min assist for static standing.                             Cognition Arousal/Alertness: Awake/alert Behavior During Therapy: Flat affect Overall Cognitive Status: Impaired/Different from baseline Area of Impairment: Memory;Awareness;Following commands                     Memory: Decreased short-term memory     Awareness: Emergent Problem Solving: Slow processing;Requires verbal cues General Comments: Pt more interactive today.       Exercises      General Comments General comments (skin integrity, edema, etc.): VSS      Pertinent Vitals/Pain Pain Assessment: No/denies pain    Home Living                      Prior Function            PT Goals (current goals can now be found in the care plan section) Progress towards PT goals: Progressing toward goals    Frequency    Min 2X/week      PT Plan Current plan remains appropriate    Co-evaluation              AM-PAC PT "6 Clicks" Mobility   Outcome Measure  Help needed turning from your back to your side while in a flat bed without using bedrails?:  A Little Help needed moving from lying on your back to sitting on the side of a flat bed without using bedrails?: A Little Help needed moving to and from a bed to a chair (including a wheelchair)?: A Little Help needed standing up from a chair using your arms (e.g., wheelchair or bedside chair)?: A Little Help needed to walk in hospital room?: A Little Help needed climbing 3-5 steps with a railing? : Total 6 Click Score: 16    End of Session Equipment Utilized During Treatment: Gait belt Activity Tolerance: Patient limited by fatigue Patient left: with call bell/phone within reach;in chair;with chair alarm set Nurse  Communication: Mobility status;Other (comment)(condom cath fell off) PT Visit Diagnosis: Difficulty in walking, not elsewhere classified (R26.2);Adult, failure to thrive (R62.7)     Time: [id number] PT Time Calculation (min) (ACUTE ONLY): 29 min  Charges:  $Gait Training: 23-37 mins                     [place] Pager [phone] Office [place] 02/02/2019, 4:09 PM

## 2019-02-02 NOTE — Progress Notes (Signed)
Nutrition Follow-up  DOCUMENTATION CODES:   Severe malnutrition in context of chronic illness, Underweight  INTERVENTION:   Night feed regimen via Cortrak tube: -Vital AF 1.2 @ 65 ml/hr x 14 hours (1900-0900) -Provides 1092 kcals (~72% of needs), 68g protein (~91% of needs) and 738 ml H2O.  -Continue Ensure Enlive po TID, each supplement provides 350 kcal and 20 grams of protein -Will monitor for plan going forward  NUTRITION DIAGNOSIS:   Severe Malnutrition related to chronic illness(CKD) as evidenced by severe muscle depletion, severe fat depletion, percent weight loss.  Ongoing.  GOAL:   Patient will meet greater than or equal to 90% of their needs  Meeting with PO + TF.  MONITOR:   TF tolerance, Labs, PO intake, Skin, I & O's  ASSESSMENT:   Pt with PMH of HTN and stage V CKD s/p renal transplantation presenting with AMS.  He was admitted to [facility] from 10/28-11/3 for COVID-19-associated PNA with acute respiratory failure with hypoxia.  He completed a course of Remdesivir and steroids, and he was discharged home on 11/3, patient was brought back to ED, given failure to thrive, dehydration and altered mental status, he was noted to have significant dehydration with hypernatremia and renal failure, he was transferred to [facility] for further management.  11/8 -admitted 11/17 -Cortrak tube placed, TF initiated 11/19 -HD trial 11/20 -HD trial 11/21 -diet advanced to dysphagia 1  11/23 -TF transitioned to night feeds  **RD working remotely**  Patient continues to struggle with appetite. PO intakes ~10-15%. MD had held night TF on 11/28 in order to stimulate appetite.   Would continue TF if pt continues HD and is within [place].  Palliative care following, family was prepared to have pt at home with hospice. Pt still deciding about continuation of HD, at this time would like to continue per nephrology note.   Last HD 11/28.  Admission weight: 96 lbs. Current weight:  105 lbs.  I/Os: +4.7L since 11/16 UOP: 400 ml x 24 hrs  Medications: Folic acid tablet, MAG-OX tablet Labs reviewed: CBGs: 140-168  Diet Order:   Diet Order            DIET - DYS 1 Room service appropriate? Yes; Fluid consistency: Thin  Diet effective now              EDUCATION NEEDS:   Not appropriate for education at this time  Skin:  Skin Assessment: Reviewed RN Assessment  Last BM:  11/27- type 7  Height:   Ht Readings from Last 1 Encounters:  01/12/19 5\' 5"  (1.651 m)    Weight:   Wt Readings from Last 1 Encounters:  02/02/19 47.7 kg    Ideal Body Weight:  61.8 kg  BMI:  Body mass index is 17.5 kg/m.  Estimated Nutritional Needs:   Kcal:  1500-1700  Protein:  75-90 grams  Fluid:  > 1.5 L/day   Clayton Bibles, MS, RD, LDN Inpatient Clinical Dietitian Pager: [phone] After Hours Pager: [phone]

## 2019-02-02 NOTE — Progress Notes (Signed)
I am unsure of how much Cellcept the pt received last night. His IV leaked and soaked his bed and the cellcept was empty. Pharamacy stated that it was okay and he can get the  8 am dose.

## 2019-02-03 LAB — RENAL FUNCTION PANEL
Albumin: 1.8 g/dL — ABNORMAL LOW (ref 3.5–5.0)
Anion gap: 11 (ref 5–15)
BUN: 63 mg/dL — ABNORMAL HIGH (ref 8–23)
CO2: 23 mmol/L (ref 22–32)
Calcium: 7.6 mg/dL — ABNORMAL LOW (ref 8.9–10.3)
Chloride: 101 mmol/L (ref 98–111)
Creatinine, Ser: 4 mg/dL — ABNORMAL HIGH (ref 0.61–1.24)
GFR calc Af Amer: 16 mL/min — ABNORMAL LOW (ref >60)
GFR calc non Af Amer: 14 mL/min — ABNORMAL LOW (ref >60)
Glucose, Bld: 143 mg/dL — ABNORMAL HIGH (ref 70–99)
Phosphorus: 4.7 mg/dL — ABNORMAL HIGH (ref 2.5–4.6)
Potassium: 4.5 mmol/L (ref 3.5–5.1)
Sodium: 135 mmol/L (ref 135–145)

## 2019-02-03 LAB — GLUCOSE, CAPILLARY
Glucose-Capillary: 122 mg/dL — ABNORMAL HIGH (ref 70–99)
Glucose-Capillary: 125 mg/dL — ABNORMAL HIGH (ref 70–99)
Glucose-Capillary: 126 mg/dL — ABNORMAL HIGH (ref 70–99)
Glucose-Capillary: 140 mg/dL — ABNORMAL HIGH (ref 70–99)
Glucose-Capillary: 170 mg/dL — ABNORMAL HIGH (ref 70–99)
Glucose-Capillary: 184 mg/dL — ABNORMAL HIGH (ref 70–99)
Glucose-Capillary: 189 mg/dL — ABNORMAL HIGH (ref 70–99)

## 2019-02-03 MED ORDER — PHENOL 1.4 % MT LIQD
1.0000 | OROMUCOSAL | Status: DC | PRN
  Administered 2019-02-03: 1 via OROMUCOSAL
  Filled 2019-02-03: qty 177

## 2019-02-03 MED ORDER — MYCOPHENOLATE MOFETIL HCL 500 MG IV SOLR
500.0000 mg | Freq: Two times a day (BID) | INTRAVENOUS | Status: DC
  Administered 2019-02-03 – 2019-02-04 (×3): 500 mg via INTRAVENOUS
  Filled 2019-02-03 (×3): qty 15

## 2019-02-03 NOTE — Progress Notes (Signed)
[facility]  PROGRESS NOTE  Samer Botbyl [id number] DOB: [birth year] DOA: 01/11/2019 PCP: Default, Provider, MD   Brief HPI:   73 year old male with a history of hypertension, CKDstageV status post renal transplantation, presented with altered mental status.  He was admitted to [facility] from 12/30/2020 11-24 COVID-19 associated pneumonia with acute respiratory failure with hypoxia.  He completed course of remdesivir and steroids and was discharged home on 01/06/2019.  He was brought back to ED given failure to thrive, dehydration, altered mental status and renal failure.  He was initially treated for HCAP.  Worsened around 01/17/2019 and was treated with steroids and remdesivir and plasma.  He was transferred to [facility] for intermittent hemodialysis.  On 01/23/2019 he decompensated again noted mental status and respiratory distress and was restarted on steroids and antibiotics for HCAP.  Patient has improved from respiratory status but continues to have failure to thrive.  Requiring tube feedings with cortrak.  Nephrology is following for dialysis.  Palliative care was consulted for goals of care.    Subjective   Patient seen and examined, diet has been advanced to a dysphagia 3 diet.  Still has feeding tube in place.   Assessment/Plan:     1. Acute hypoxic respiratory failure-recurrent sepsis due to HCAP/COVID-19 pneumonia.  Improved.  O2 sats 96- 100% on room air.  Patient was treated with cefepime and vancomycin for 7 days.  Received steroids and remdesivir twice, convalescent plasma 01/17/2019.  2. Acute renal injury on CKD stage V-patient has history of renal transplant, baseline appears 3-4 range.  Nephrology following, on hemodialysis.  Continue Prograf, CellCept, Bactrim prophylaxis.  3. Failure to thrive-patient is unable to take care of himself at home.  Discharged on 01/06/2019 and readmitted on 01/11/2019.  Now presenting with worsening renal function.  Cortrak Feeding  tube placed.  Speech therapy evaluation obtained, started on dysphagia 1 thin liquid diet on 01/28/2019.  Speech therapy has advanced his to dysphagia 3 diet.  Will discontinue NG feeding tube.    4. Staph epidermidis UTI-treated with vancomycin.  5. Hypertension-blood pressure stable, continue Modicon, clonidine, as needed labetalol, hydralazine.  6. GI bleed-FOBT was positive DVT prophylaxis was held.  GI is not planning for colonoscopy.  Patient received 2 units PRBC.  Transfuse for hemoglobin less than 7.  7. Goals of care-Palliative  care was consulted for goals of care.  Discussed with patient, he wants to continue with hemodialysis, not sure about long-term feeding tube option.  Palliative care is following.    CBG: Recent Labs  Lab 02/02/19 2048 02/02/19 2347 02/03/19 0423 02/03/19 0735 02/03/19 1227  GLUCAP 122* 126* 125* 170* 184*    CBC: Recent Labs  Lab 01/28/19 0415 01/29/19 0415 01/30/19 0417 02/02/19 0559  WBC 7.9 10.4 11.4* 7.2  NEUTROABS 6.3  --   --   --   HGB 7.0* 7.2* 8.0* 7.4*  HCT 22.1* 22.0* 24.3* 22.8*  MCV 90.6 89.4 90.0 91.6  PLT 260 246 294 [id number]    Basic Metabolic Panel: Recent Labs  Lab 01/28/19 0415  01/30/19 0417 01/31/19 0430 02/01/19 0609 02/02/19 0559 02/03/19 0115  NA 136   < > 136 136 137 135 135  K 4.7   < > 4.3 4.0 3.8 3.9 4.5  CL 101   < > 98 101 102 100 101  CO2 26   < > 26 25 24 26 23   GLUCOSE 252*   < > 129* 155* 103* 143* 143*  BUN 77*   < >  62* 69* 32* 50* 63*  CREATININE 3.73*   < > 3.23* 3.51* 2.58* 3.69* 4.00*  CALCIUM 7.3*   < > 7.6* 7.5* 7.7* 7.6* 7.6*  MG 1.8  --   --   --   --  2.0  --   PHOS 4.3   < > 3.5 3.8 3.7 3.8 4.7*   < > = values in this interval not displayed.    COVID-19 Labs  No results for input(s): DDIMER, FERRITIN, LDH, CRP in the last 72 hours.  Lab Results  Component Value Date   [place] (A) 01/28/2019   [place] (A) 12/31/2018     DVT prophylaxis:  SCDs  Code Status: DNR  Family Communication: No family at bedside  Disposition Plan: likely skilled nursing facility in the next few days.        BMI  Estimated body mass index is 16.58 kg/m as calculated from the following:   Height as of this encounter: 5\' 5"  (1.651 m).   Weight as of this encounter: 45.2 kg.  Scheduled medications:  . amLODipine  10 mg Oral Daily  . Chlorhexidine Gluconate Cloth  6 each Topical Q0600  . Chlorhexidine Gluconate Cloth  6 each Topical Q0600  . Chlorhexidine Gluconate Cloth  6 each Topical Q0600  . cloNIDine  0.2 mg Oral BID  . darbepoetin (ARANESP) injection - NON-DIALYSIS  150 mcg Subcutaneous Q Wed-1800  . feeding supplement (ENSURE ENLIVE)  237 mL Oral TID BM  . feeding supplement (VITAL AF 1.2 CAL)  1,000 mL Per Tube Q24H  . folic acid  1 mg Oral Daily  . free water  200 mL Per Tube Q8H  . hydrALAZINE  25 mg Oral Q8H  . magnesium oxide  400 mg Oral BID WC  . mouth rinse  15 mL Mouth Rinse BID  . metoprolol tartrate  50 mg Oral BID  . mupirocin ointment   Nasal BID  . pantoprazole  40 mg Oral BID  . [START ON 02/04/2019] predniSONE  10 mg Oral Q breakfast  . sodium chloride flush  3 mL Intravenous Q12H  . sulfamethoxazole-trimethoprim  1 tablet Oral Q M,W,F  . tacrolimus  4 mg Oral BID    Consultants:  Nephrology  Procedures:  Cortrak placement on 11/17   Antibiotics:   Anti-infectives (From admission, onward)   Start     Dose/Rate Route Frequency Ordered Stop   01/28/19 1347  vancomycin (VANCOCIN) 500-5 MG/100ML-% IVPB    Note to Pharmacy: Cherylann Banas   : cabinet override      01/28/19 1347 01/28/19 1615   01/28/19 1200  vancomycin (VANCOCIN) IVPB 500 mg/100 ml premix     500 mg 100 mL/hr over 60 Minutes Intravenous Every Wed (Hemodialysis) 01/28/19 1034 01/28/19 1713   01/27/19 0800  vancomycin (VANCOCIN) 500 mg in sodium chloride 0.9 % 100 mL IVPB     500 mg 100 mL/hr over 60 Minutes Intravenous  Once 01/27/19  0714 01/27/19 1140   01/24/19 1800  ceFEPIme (MAXIPIME) 1 g in sodium chloride 0.9 % 100 mL IVPB     1 g 200 mL/hr over 30 Minutes Intravenous Every 24 hours 01/23/19 1122 01/29/19 1841   01/23/19 1200  ceFEPIme (MAXIPIME) 1 g in sodium chloride 0.9 % 100 mL IVPB     1 g 200 mL/hr over 30 Minutes Intravenous  Once 01/23/19 1122 01/23/19 1452   01/23/19 1130  vancomycin (VANCOCIN) IVPB 1000 mg/200 mL premix  1,000 mg 200 mL/hr over 60 Minutes Intravenous  Once 01/23/19 1122 01/23/19 1623   01/23/19 1123  vancomycin variable dose per unstable renal function (pharmacist dosing)  Status:  Discontinued      Does not apply See admin instructions 01/23/19 1123 01/28/19 1035   01/17/19 1300  remdesivir 100 mg in sodium chloride 0.9 % 250 mL IVPB     100 mg 500 mL/hr over 30 Minutes Intravenous Daily 01/17/19 1144 01/21/19 1106   01/16/19 1800  vancomycin (VANCOCIN) 500 mg in sodium chloride 0.9 % 100 mL IVPB     500 mg 100 mL/hr over 60 Minutes Intravenous  Once 01/13/19 1758 01/16/19 2323   01/13/19 1900  vancomycin (VANCOCIN) IVPB 1000 mg/200 mL premix     1,000 mg 200 mL/hr over 60 Minutes Intravenous  Once 01/13/19 1758 01/13/19 2200   01/12/19 1800  ceFEPIme (MAXIPIME) 1 g in sodium chloride 0.9 % 100 mL IVPB     1 g 200 mL/hr over 30 Minutes Intravenous Every 24 hours 01/11/19 0910 01/17/19 1904   01/12/19 1600  sulfamethoxazole-trimethoprim (BACTRIM) 400-80 MG per tablet 1 tablet    Note to Pharmacy: Take one tablet by mouth on Monday, Wednesday and fridays.     1 tablet Oral Every M-W-F 01/12/19 1449     01/11/19 0830  vancomycin (VANCOCIN) IVPB 1000 mg/200 mL premix  Status:  Discontinued     1,000 mg 200 mL/hr over 60 Minutes Intravenous  Once 01/11/19 0825 01/11/19 0901   01/11/19 0830  ceFEPIme (MAXIPIME) 2 g in sodium chloride 0.9 % 100 mL IVPB     2 g 200 mL/hr over 30 Minutes Intravenous  Once 01/11/19 0825 01/11/19 0952       Objective   Vitals:   02/03/19 1214  02/03/19 1235 02/03/19 1245 02/03/19 1315  BP:   (!) 135/56 140/81  Pulse: 64 73 70 77  Resp: 20 19 (!) 26 17  Temp:   98.2 F (36.8 C)   TempSrc:   Oral   SpO2: 100% 100% 99% 98%  Weight:      Height:        Intake/Output Summary (Last 24 hours) at 02/03/2019 1538 Last data filed at 02/03/2019 1400 Gross per 24 hour  Intake 540.82 ml  Output 3100 ml  Net -2559.18 ml   Filed Weights   02/02/19 0500 02/03/19 0712 02/03/19 1100  Weight: 47.7 kg 47.2 kg 45.2 kg     Physical Examination:  General-appears in no acute distress Heart-S1-S2, regular, no murmur auscultated Lungs-clear to auscultation bilaterally, no wheezing or crackles auscultated Abdomen-soft, nontender, no organomegaly Extremities-no edema in the lower extremities Neuro-alert, oriented x3, no focal deficit noted  Data Reviewed: I have personally reviewed following labs and imaging studies   Recent Results (from the past 240 hour(s))  SARS Coronavirus 2 by RT PCR (hospital order, performed in [place] hospital lab) Nasopharyngeal Nasopharyngeal Swab     Status: Abnormal   Collection Time: 01/28/19  6:00 PM   Specimen: Nasopharyngeal Swab  Result Value Ref Range Status   SARS Coronavirus 2 POSITIVE (A) NEGATIVE Final    Comment: RESULT CALLED TO, READ BACK BY AND VERIFIED WITH: B RODDY RN 01/28/19 1903 JDW (NOTE) SARS-CoV-2 target nucleic acids are DETECTED SARS-CoV-2 RNA is generally detectable in upper respiratory specimens  during the acute phase of infection.  Positive results are indicative  of the presence of the identified virus, but do not rule out bacterial infection or co-infection with  other pathogens not detected by the test.  Clinical correlation with patient history and  other diagnostic information is necessary to determine patient infection status.  The expected result is negative. Fact Sheet for Patients:   StrictlyIdeas.no  Fact Sheet for Healthcare  Providers:   BankingDealers.co.za   This test is not yet approved or cleared by the [place] FDA and  has been authorized for detection and/or diagnosis of SARS-CoV-2 by FDA under an Emergency Use Authorization (EUA).  This EUA will remain in effect (meaning this test can be used ) for the duration of  the COVID-19 declaration under Section 564(b)(1) of the Act, 21 U.S.C. section 360-bbb-3(b)(1), unless the authorization is terminated or revoked sooner. Performed at [facility], [place] [address] [place], [place] [address]      Liver Function Tests: Recent Labs  Lab 01/28/19 [phone]  01/30/19 0417 01/31/19 0430 02/01/19 0609 02/02/19 0559 02/03/19 0115  AST 13*  --   --   --   --   --   --   ALT 19  --   --   --   --   --   --   ALKPHOS 89  --   --   --   --   --   --   BILITOT 0.7  --   --   --   --   --   --   PROT 5.6*  --   --   --   --   --   --   ALBUMIN 1.6*   < > 1.7* 1.6* 1.8* 1.7* 1.8*   < > = values in this interval not displayed.   No results for input(s): LIPASE, AMYLASE in the last 168 hours. No results for input(s): AMMONIA in the last 168 hours.  Cardiac Enzymes: No results for input(s): CKTOTAL, CKMB, CKMBINDEX, TROPONINI in the last 168 hours. BNP (last 3 results) Recent Labs    01/01/19 0826  BNP 154.8*      Admission status: Inpatient: Based on patients clinical presentation and evaluation of above clinical data, I have made determination that patient meets Inpatient criteria at this time.   [facility] Pager [phone]. If 7PM-7AM, please contact night-coverage at [email or web address], Office  [phone]  password [email or web address]  02/03/2019, 3:38 PM  LOS: 23 days

## 2019-02-03 NOTE — Progress Notes (Signed)
Patient ID: Francisco Williamson, male   DOB: [birth year], 73 y.o.   MRN: [id number] S: tolerated HD well today with UF of 2 liters O:BP (!) 135/56 (BP Location: Right Arm)   Pulse 70   Temp 98.2 F (36.8 C) (Oral)   Resp (!) 26   Ht 5\' 5"  (1.651 m)   Wt 45.2 kg   SpO2 99%   BMI 16.58 kg/m   Intake/Output Summary (Last 24 hours) at 02/03/2019 1258 Last data filed at 02/03/2019 1100 Gross per 24 hour  Intake 474.9 ml  Output 3100 ml  Net -2625.1 ml   Intake/Output: I/O last 3 completed shifts: In: 714.9 [NG/GT:480; IV Piggyback:234.9] Out: 1500 [Urine:1500]  Intake/Output this shift:  Total I/O In: -  Out: 2000 [Other:2000] Weight change:  Physical exam: unable to complete due to COVID + status.  In order to preserve PPE equipment and to minimize exposure to providers.  Notes from other caregivers reviewed  Recent Labs  Lab 01/28/19 0415 01/29/19 0415 01/30/19 0417 01/31/19 0430 02/01/19 0609 02/02/19 0559 02/03/19 0115  NA 136 136 136 136 137 135 135  K 4.7 4.3 4.3 4.0 3.8 3.9 4.5  CL 101 99 98 101 102 100 101  CO2 26 27 26 25 24 26 23   GLUCOSE 252* 179* 129* 155* 103* 143* 143*  BUN 77* 47* 62* 69* 32* 50* 63*  CREATININE 3.73* 2.71* 3.23* 3.51* 2.58* 3.69* 4.00*  ALBUMIN 1.6* 1.7* 1.7* 1.6* 1.8* 1.7* 1.8*  CALCIUM 7.3* 7.4* 7.6* 7.5* 7.7* 7.6* 7.6*  PHOS 4.3 3.8 3.5 3.8 3.7 3.8 4.7*  AST 13*  --   --   --   --   --   --   ALT 19  --   --   --   --   --   --    Liver Function Tests: Recent Labs  Lab 01/28/19 0415  02/01/19 0609 02/02/19 0559 02/03/19 0115  AST 13*  --   --   --   --   ALT 19  --   --   --   --   ALKPHOS 89  --   --   --   --   BILITOT 0.7  --   --   --   --   PROT 5.6*  --   --   --   --   ALBUMIN 1.6*   < > 1.8* 1.7* 1.8*   < > = values in this interval not displayed.   No results for input(s): LIPASE, AMYLASE in the last 168 hours. No results for input(s): AMMONIA in the last 168 hours. CBC: Recent Labs  Lab 01/28/19 0415 01/29/19 0415  01/30/19 0417 02/02/19 0559  WBC 7.9 10.4 11.4* 7.2  NEUTROABS 6.3  --   --   --   HGB 7.0* 7.2* 8.0* 7.4*  HCT 22.1* 22.0* 24.3* 22.8*  MCV 90.6 89.4 90.0 91.6  PLT 260 246 294 228   Cardiac Enzymes: No results for input(s): CKTOTAL, CKMB, CKMBINDEX, TROPONINI in the last 168 hours. CBG: Recent Labs  Lab 02/02/19 1627 02/02/19 2048 02/02/19 2347 02/03/19 0423 02/03/19 0735  GLUCAP 140* 122* 126* 125* 170*    Iron Studies: No results for input(s): IRON, TIBC, TRANSFERRIN, FERRITIN in the last 72 hours. Studies/Results: No results found. Marland Kitchen amLODipine  10 mg Oral Daily  . Chlorhexidine Gluconate Cloth  6 each Topical Q0600  . Chlorhexidine Gluconate Cloth  6 each Topical Q0600  . Chlorhexidine Gluconate Cloth  6 each Topical Q0600  . cloNIDine  0.2 mg Oral BID  . darbepoetin (ARANESP) injection - NON-DIALYSIS  150 mcg Subcutaneous Q Wed-1800  . feeding supplement (ENSURE ENLIVE)  237 mL Oral TID BM  . feeding supplement (VITAL AF 1.2 CAL)  1,000 mL Per Tube Q24H  . folic acid  1 mg Oral Daily  . free water  200 mL Per Tube Q8H  . hydrALAZINE  25 mg Oral Q8H  . magnesium oxide  400 mg Oral BID WC  . mouth rinse  15 mL Mouth Rinse BID  . metoprolol tartrate  50 mg Oral BID  . mupirocin ointment   Nasal BID  . pantoprazole  40 mg Oral BID  . [START ON 02/04/2019] predniSONE  10 mg Oral Q breakfast  . sodium chloride flush  3 mL Intravenous Q12H  . sulfamethoxazole-trimethoprim  1 tablet Oral Q M,W,F  . tacrolimus  4 mg Oral BID    BMET    Component Value Date/Time   NA 135 02/03/2019 0115   K 4.5 02/03/2019 0115   CL 101 02/03/2019 0115   CO2 23 02/03/2019 0115   GLUCOSE 143 (H) 02/03/2019 0115   BUN 63 (H) 02/03/2019 0115   CREATININE 4.00 (H) 02/03/2019 0115   CALCIUM 7.6 (L) 02/03/2019 0115   CALCIUM 8.7 12/19/2018 1428   GFRNONAA 14 (L) 02/03/2019 0115   GFRAA 16 (L) 02/03/2019 0115   CBC    Component Value Date/Time   WBC 7.2 02/02/2019 0559   RBC  2.49 (L) 02/02/2019 0559   HGB 7.4 (L) 02/02/2019 0559   HCT 22.8 (L) 02/02/2019 0559   PLT 228 02/02/2019 0559   MCV 91.6 02/02/2019 0559   MCH 29.7 02/02/2019 0559   MCHC 32.5 02/02/2019 0559   RDW 17.0 (H) 02/02/2019 0559   LYMPHSABS 0.8 01/28/2019 0415   MONOABS 0.7 01/28/2019 0415   EOSABS 0.0 01/28/2019 0415   BASOSABS 0.0 01/28/2019 0415    Assessment/Plan:  1. 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  Last Cr was up from 4 in July to 4.6 in October.  Felt to be due to acute illness and volume depletion.  He has had progressive azotemia and undergone 5 HD sessions with improvement.  He is willing to continue with IHD if needed. However he has had some FTT over the past few months.   1. UOP dropped off on 11/29 but increased on 11/30.   2. Tolerated HD today 3. Will follow UOP and trend in BUN/Cr before deciding on further sessions of HD. 2. Covid 19 infection- admitted to [facility] 10/28-11/3 for covid 19 PNA and acute respiratory failure with hypoxia.  He completed course of remdesivir and steroids but continued to c/o fatigue and anorexia with FTT.  Now on room air 3. H/o kidney transplant in April [id number] at [facility] complicated by polyoma virus and chronic allograft nephropathy. 4. FTT- agree with palliative care consult for goals/limits of care 5. HTN- stable 6. GIB- +FOBT.  Follow h/h  7. Severe protein malnutrition- on supplements.  Donetta Potts, MD [employer] [phone]

## 2019-02-03 NOTE — Consult Note (Signed)
   [facility] CM Inpatient Consult   02/03/2019  Pinches Zarrella Nov 26, 1945 [id number]   Follow up: Progress and Disposition  Chart reviewed briefly for LOS and disposition needs.  Reviewed PT/OT recommendations and inpatient TOC LCSW notes for patient/family decisions on SNF with HD vs home with hospice/palliative care, with this Medicare [employer] member.  For questions, please contact:  Natividad Brood, RN BSN [facility] Liaison  [phone] business mobile phone Toll free office [phone]  Fax number: [phone] [email or web address]

## 2019-02-03 NOTE — Progress Notes (Signed)
  Speech Language Pathology Treatment: Dysphagia  Patient Details Name: Francisco Williamson MRN: [id number] DOB: 1945-05-03 Today's Date: 02/03/2019 Time: [id number] SLP Time Calculation (min) (ACUTE ONLY): 11 min  Assessment / Plan / Recommendation Clinical Impression  Pt much more alert and interactive since last seen by SLP.  He was able to attend to and consume graham crackers and water with no difficulty masticating, no s/s of aspiration.  He stated "they say they may pull this tube out of my nose."  Now that MS is improving, recommend advancing diet to dysphagia 3, thin liquids; consider pulling cortrak for improved comfort, particularly since he is on an oral diet and it will need to be removed prior to D/C.  D/W RN.     HPI HPI: 74 yo male presenting with AMS and failure to thrive, renal failure.Pt was admitted to [facility] from 10/28-11/3 for COVID + and PNA. 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  Clinical swallow evaluation was completed on 11/19 - pt was communicative and swallow appeared to be [facility]; regular diet was recommended.  11/20 CXR with worsening bilateral pna; continued 02 requirement, fever and AMS. Swallow evaluation reordered.        SLP Plan  Continue with current plan of care       Recommendations  Diet recommendations: Dysphagia 3 (mechanical soft);Thin liquid Liquids provided via: Cup;Straw Medication Administration: Whole meds with puree Supervision: Patient able to self feed Postural Changes and/or Swallow Maneuvers: Seated upright 90 degrees                Oral Care Recommendations: Oral care BID Follow up Recommendations: None SLP Visit Diagnosis: Dysphagia, unspecified (R13.10) Plan: Continue with current plan of care       GO                [name] 02/03/2019, 3:05 PM  Edita Weyenberg L. Tivis Ringer, [place] Office number [phone] Pager [phone]

## 2019-02-04 LAB — CBC
HCT: 25 % — ABNORMAL LOW (ref 39.0–52.0)
Hemoglobin: 8.1 g/dL — ABNORMAL LOW (ref 13.0–17.0)
MCH: 30.1 pg (ref 26.0–34.0)
MCHC: 32.4 g/dL (ref 30.0–36.0)
MCV: 92.9 fL (ref 80.0–100.0)
Platelets: 214 10*3/uL (ref 150–400)
RBC: 2.69 MIL/uL — ABNORMAL LOW (ref 4.22–5.81)
RDW: 16.2 % — ABNORMAL HIGH (ref 11.5–15.5)
WBC: 5.9 10*3/uL (ref 4.0–10.5)
nRBC: 0 % (ref 0.0–0.2)

## 2019-02-04 LAB — RENAL FUNCTION PANEL
Albumin: 1.8 g/dL — ABNORMAL LOW (ref 3.5–5.0)
Anion gap: 10 (ref 5–15)
BUN: 53 mg/dL — ABNORMAL HIGH (ref 8–23)
CO2: 25 mmol/L (ref 22–32)
Calcium: 7.9 mg/dL — ABNORMAL LOW (ref 8.9–10.3)
Chloride: 99 mmol/L (ref 98–111)
Creatinine, Ser: 3.47 mg/dL — ABNORMAL HIGH (ref 0.61–1.24)
GFR calc Af Amer: 19 mL/min — ABNORMAL LOW (ref >60)
GFR calc non Af Amer: 17 mL/min — ABNORMAL LOW (ref >60)
Glucose, Bld: 113 mg/dL — ABNORMAL HIGH (ref 70–99)
Phosphorus: 4.2 mg/dL (ref 2.5–4.6)
Potassium: 4.4 mmol/L (ref 3.5–5.1)
Sodium: 134 mmol/L — ABNORMAL LOW (ref 135–145)

## 2019-02-04 LAB — GLUCOSE, CAPILLARY
Glucose-Capillary: 160 mg/dL — ABNORMAL HIGH (ref 70–99)
Glucose-Capillary: 104 mg/dL — ABNORMAL HIGH (ref 70–99)

## 2019-02-04 MED ORDER — MYCOPHENOLATE MOFETIL 250 MG PO CAPS
500.0000 mg | ORAL_CAPSULE | Freq: Two times a day (BID) | ORAL | Status: DC
  Administered 2019-02-04 – 2019-02-06 (×4): 500 mg via ORAL
  Filled 2019-02-04 (×4): qty 2

## 2019-02-04 NOTE — Progress Notes (Signed)
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   Admitted to [facility], 10/28 -11/3 with COVID-19 associated pneumonia with acute respiratory failure with hypoxia.  He completed course of remdesivir and steroids and was discharged home on 01/06/2019.  Brought back to the emergency room on 01/11/2019 with failure to thrive, dehydration, altered mental status and renal failure.  Initially treated for healthcare associated pneumonia.  Oxygen requirement worsened on 01/17/2019 and was treated with another dose of steroids, remdesivir and convalescent plasma.  He was transferred to [facility] for intermittent hemodialysis. 01/23/2019, he decompensated again with altered mentation respiratory distress.  Initially required core track feeding, now eating by mouth.   Assessment & Plan:   Principal Problem:   Acute renal failure superimposed on chronic kidney disease ([place]) Active Problems:   CKD stage 5 secondary to hypertension ([place])   Pneumonia due to COVID-19 virus   Essential hypertension   History of renal transplant   Severe sepsis (HCC)   Sepsis secondary to UTI ([place])   HCAP (healthcare-associated pneumonia)   Bilateral pleural effusion   Acute hypernatremia   Goals of care, counseling/discussion   Palliative care by specialist   Protein-calorie malnutrition, severe   Adult failure to thrive   Weakness generalized   Encounter for hospice care discussion  Acute hypoxic respiratory failure, recurrent sepsis due to HCAP/COVID-19 pneumonia: Adequately improved.  Treated with multiple rounds of antibiotics, steroids, remdesivir and convalescent plasma.  Currently afebrile and on room air.  Acute renal injury on CKD stage IV with history of renal  transplant: Followed by nephrology.  Currently requiring intermittent hemodialysis.  Also on Prograf, CellCept and Bactrim prophylaxis. Urine output 1400 mL last 24 hours.  Nephrology to decide final dialysis recommendation.  Failure to thrive, deconditioning, unable to take care of himself: Some clinical improvement today.  Currently on dysphagia 3 diet.  NG feeding discontinued.  Encourage oral intake.  Staph epidermidis UTI: Treated with vancomycin.  Hypertension: On multiple medications and controlled.  GI bleeding with chronic blood loss anemia: Received total 2 units of PRBC.  Hemoglobin is stable since then.   DVT prophylaxis: SCDs Code Status: DNR Family Communication: Patient's daughter on the phone Disposition Plan: Home with home health OT PT once dialysis plans are made   Consultants:   Nephrology  Procedures:   Hemodialysis  Antimicrobials: All finished. Anti-infectives (From admission, onward)   Start     Dose/Rate Route Frequency Ordered Stop   01/28/19 1347  vancomycin (VANCOCIN) 500-5 MG/100ML-% IVPB    Note to Pharmacy: Cherylann Banas   : cabinet override      01/28/19 1347 01/28/19 1615   01/28/19 1200  vancomycin (VANCOCIN) IVPB 500 mg/100 ml premix     500 mg 100 mL/hr over 60 Minutes Intravenous Every Wed (Hemodialysis) 01/28/19 1034 01/28/19 1713   01/27/19 0800  vancomycin (VANCOCIN) 500 mg in sodium chloride 0.9 % 100 mL IVPB     500 mg 100 mL/hr over 60 Minutes Intravenous  Once 01/27/19 0714 01/27/19 1140   01/24/19 1800  ceFEPIme (MAXIPIME) 1 g in sodium chloride 0.9 % 100 mL IVPB     1 g 200 mL/hr over 30 Minutes Intravenous Every 24 hours 01/23/19 1122 01/29/19 1841   01/23/19 1200  ceFEPIme (MAXIPIME) 1 g  in sodium chloride 0.9 % 100 mL IVPB     1 g 200 mL/hr over 30 Minutes Intravenous  Once 01/23/19 1122 01/23/19 1452   01/23/19 1130  vancomycin (VANCOCIN) IVPB 1000 mg/200 mL premix     1,000 mg 200 mL/hr over 60 Minutes Intravenous  Once  01/23/19 1122 01/23/19 1623   01/23/19 1123  vancomycin variable dose per unstable renal function (pharmacist dosing)  Status:  Discontinued      Does not apply See admin instructions 01/23/19 1123 01/28/19 1035   01/17/19 1300  remdesivir 100 mg in sodium chloride 0.9 % 250 mL IVPB     100 mg 500 mL/hr over 30 Minutes Intravenous Daily 01/17/19 1144 01/21/19 1106   01/16/19 1800  vancomycin (VANCOCIN) 500 mg in sodium chloride 0.9 % 100 mL IVPB     500 mg 100 mL/hr over 60 Minutes Intravenous  Once 01/13/19 1758 01/16/19 2323   01/13/19 1900  vancomycin (VANCOCIN) IVPB 1000 mg/200 mL premix     1,000 mg 200 mL/hr over 60 Minutes Intravenous  Once 01/13/19 1758 01/13/19 2200   01/12/19 1800  ceFEPIme (MAXIPIME) 1 g in sodium chloride 0.9 % 100 mL IVPB     1 g 200 mL/hr over 30 Minutes Intravenous Every 24 hours 01/11/19 0910 01/17/19 1904   01/12/19 1600  sulfamethoxazole-trimethoprim (BACTRIM) 400-80 MG per tablet 1 tablet    Note to Pharmacy: Take one tablet by mouth on Monday, Wednesday and fridays.     1 tablet Oral Every M-W-F 01/12/19 1449     01/11/19 0830  vancomycin (VANCOCIN) IVPB 1000 mg/200 mL premix  Status:  Discontinued     1,000 mg 200 mL/hr over 60 Minutes Intravenous  Once 01/11/19 0825 01/11/19 0901   01/11/19 0830  ceFEPIme (MAXIPIME) 2 g in sodium chloride 0.9 % 100 mL IVPB     2 g 200 mL/hr over 30 Minutes Intravenous  Once 01/11/19 0825 01/11/19 0952         Subjective: Patient seen and examined.  Afebrile.  On room air.  He was very excited to be able to walk around.  Denies any chest pain shortness of breath.  He did not eat much of his breakfast. Later in the day, he was very upset about not letting him go home. I called and discussed with his family that hemodialysis plans have not been finalized.  Objective: Vitals:   02/04/19 1138 02/04/19 1200 02/04/19 1300 02/04/19 1400  BP: (!) 136/51 (!) 150/114 120/64 (!) 159/73  Pulse: 66 64 (!) 58 68  Resp:  (!) 26 (!) 23 (!) 21 (!) 24  Temp: 98.3 F (36.8 C)     TempSrc: Oral     SpO2: 100% 100% 100% 100%  Weight:      Height:        Intake/Output Summary (Last 24 hours) at 02/04/2019 1633 Last data filed at 02/04/2019 0800 Gross per 24 hour  Intake 355 ml  Output 240 ml  Net 115 ml   Filed Weights   02/03/19 0712 02/03/19 1100 02/04/19 0500  Weight: 47.2 kg 45.2 kg 46.8 kg    Examination:  General exam: Appears calm and comfortable, on room air. Respiratory system: Clear to auscultation. Respiratory effort normal.  No added sounds. Cardiovascular system: S1 & S2 heard, RRR. No JVD, murmurs, rubs, gallops or clicks. No pedal edema. Gastrointestinal system: Abdomen is nondistended, soft and nontender. No organomegaly or masses felt. Normal bowel sounds heard. Central nervous system: Alert  and oriented. No focal neurological deficits. Extremities: Symmetric 5 x 5 power. Skin: No rashes, lesions or ulcers, left upper extremity AV fistula present. Psychiatry: Judgement and insight appear normal. Mood & affect flat and anxious.    Data Reviewed: I have personally reviewed following labs and imaging studies  CBC: Recent Labs  Lab 01/29/19 0415 01/30/19 0417 02/02/19 0559 02/04/19 0435  WBC 10.4 11.4* 7.2 5.9  HGB 7.2* 8.0* 7.4* 8.1*  HCT 22.0* 24.3* 22.8* 25.0*  MCV 89.4 90.0 91.6 92.9  PLT 246 294 228 [id number]   Basic Metabolic Panel: Recent Labs  Lab 01/31/19 0430 02/01/19 0609 02/02/19 0559 02/03/19 0115 02/04/19 0435  NA 136 137 135 135 134*  K 4.0 3.8 3.9 4.5 4.4  CL 101 102 100 101 99  CO2 25 24 26 23 25   GLUCOSE 155* 103* 143* 143* 113*  BUN 69* 32* 50* 63* 53*  CREATININE 3.51* 2.58* 3.69* 4.00* 3.47*  CALCIUM 7.5* 7.7* 7.6* 7.6* 7.9*  MG  --   --  2.0  --   --   PHOS 3.8 3.7 3.8 4.7* 4.2   GFR: Estimated Creatinine Clearance: 12.9 mL/min (A) (by C-G formula based on SCr of 3.47 mg/dL (H)). Liver Function Tests: Recent Labs  Lab 01/31/19 0430  02/01/19 0609 02/02/19 0559 02/03/19 0115 02/04/19 0435  ALBUMIN 1.6* 1.8* 1.7* 1.8* 1.8*   No results for input(s): LIPASE, AMYLASE in the last 168 hours. No results for input(s): AMMONIA in the last 168 hours. Coagulation Profile: No results for input(s): INR, PROTIME in the last 168 hours. Cardiac Enzymes: No results for input(s): CKTOTAL, CKMB, CKMBINDEX, TROPONINI in the last 168 hours. BNP (last 3 results) No results for input(s): PROBNP in the last 8760 hours. HbA1C: No results for input(s): HGBA1C in the last 72 hours. CBG: Recent Labs  Lab 02/02/19 2347 02/03/19 0423 02/03/19 0735 02/03/19 1227 02/03/19 1710  GLUCAP 126* 125* 170* 184* 189*   Lipid Profile: No results for input(s): CHOL, HDL, LDLCALC, TRIG, CHOLHDL, LDLDIRECT in the last 72 hours. Thyroid Function Tests: No results for input(s): TSH, T4TOTAL, FREET4, T3FREE, THYROIDAB in the last 72 hours. Anemia Panel: No results for input(s): VITAMINB12, FOLATE, FERRITIN, TIBC, IRON, RETICCTPCT in the last 72 hours. Sepsis Labs: No results for input(s): PROCALCITON, LATICACIDVEN in the last 168 hours.  Recent Results (from the past 240 hour(s))  SARS Coronavirus 2 by RT PCR (hospital order, performed in [facility] hospital lab) Nasopharyngeal Nasopharyngeal Swab     Status: Abnormal   Collection Time: 01/28/19  6:00 PM   Specimen: Nasopharyngeal Swab  Result Value Ref Range Status   SARS Coronavirus 2 POSITIVE (A) NEGATIVE Final    Comment: RESULT CALLED TO, READ BACK BY AND VERIFIED WITH: B RODDY RN 01/28/19 1903 JDW (NOTE) SARS-CoV-2 target nucleic acids are DETECTED SARS-CoV-2 RNA is generally detectable in upper respiratory specimens  during the acute phase of infection.  Positive results are indicative  of the presence of the identified virus, but do not rule out bacterial infection or co-infection with other pathogens not detected by the test.  Clinical correlation with patient history and  other  diagnostic information is necessary to determine patient infection status.  The expected result is negative. Fact Sheet for Patients:   StrictlyIdeas.no  Fact Sheet for Healthcare Providers:   BankingDealers.co.za   This test is not yet approved or cleared by the [place] FDA and  has been authorized for detection and/or diagnosis of SARS-CoV-2 by FDA  under an Emergency Use Authorization (EUA).  This EUA will remain in effect (meaning this test can be used ) for the duration of  the COVID-19 declaration under Section 564(b)(1) of the Act, 21 U.S.C. section 360-bbb-3(b)(1), unless the authorization is terminated or revoked sooner. Performed at [facility], [place] [address] [place], [place] [address]          Radiology Studies: No results found.      Scheduled Meds:  amLODipine  10 mg Oral Daily   Chlorhexidine Gluconate Cloth  6 each Topical Q0600   Chlorhexidine Gluconate Cloth  6 each Topical Q0600   Chlorhexidine Gluconate Cloth  6 each Topical Q0600   cloNIDine  0.2 mg Oral BID   darbepoetin (ARANESP) injection - NON-DIALYSIS  150 mcg Subcutaneous Q Wed-1800   feeding supplement (ENSURE ENLIVE)  237 mL Oral TID BM   folic acid  1 mg Oral Daily   hydrALAZINE  25 mg Oral Q8H   magnesium oxide  400 mg Oral BID WC   mouth rinse  15 mL Mouth Rinse BID   metoprolol tartrate  50 mg Oral BID   mupirocin ointment   Nasal BID   mycophenolate  500 mg Oral BID   pantoprazole  40 mg Oral BID   predniSONE  10 mg Oral Q breakfast   sodium chloride flush  3 mL Intravenous Q12H   sulfamethoxazole-trimethoprim  1 tablet Oral Q M,W,F   tacrolimus  4 mg Oral BID   Continuous Infusions:  sodium chloride 10 mL/hr at 01/28/19 2211   dextrose 10 mL (02/04/19 0558)   dextrose 10 mL (02/04/19 1244)     LOS: 24 days    Time spent: 25 minutes    Barb Merino, MD [facility] Pager  [phone]

## 2019-02-04 NOTE — Progress Notes (Signed)
Physical Therapy Treatment Patient Details Name: Francisco Williamson MRN: [id number] DOB: [birth year] Today's Date: 02/04/2019    History of Present Illness 73 yo male presenting with AMS and failure to thrive, renal failure. Pt transferred to [facility] and HD initiated 11/19. Pt was admitted to [facility] from 10/28-11/3 for COVID + and PNA. 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    PT Comments    Pt has made vast improvement since last time this therapist saw pt. Pt is hopeful of discharge home today and explains he will have 24 hour care at home. Pt with much improved cognition, able to ask appropriate questions about discharge and progressing his mobility. Pt continues to be limited in safe mobility by decreased strength and balance. Pt currently requires min guard for transfers and ambulation of 20 feet with RW. PT educated pt on stair ascent and practices with high stepping to simulated due to inability to train on stairs due to COVID restrictions. D/c plans need to be adjusted to HHPT. PT will continue to follow acutely.    Follow Up Recommendations  Home health PT;Supervision/Assistance - 24 hour     Equipment Recommendations  None recommended by PT       Precautions / Restrictions Precautions Precautions: Fall Restrictions Weight Bearing Restrictions: No    Mobility  Bed Mobility General bed mobility comments: OOB in recliner on entry   Transfers Overall transfer level: Needs assistance Equipment used: Rolling walker (2 wheeled) Transfers: Sit to/from [facility] Sit to Stand: Min guard Stand pivot transfers: Min guard       General transfer comment: min guard for safety, good power up and steadying in RW  Ambulation/Gait Ambulation/Gait assistance: Min guard Gait Distance (Feet): 20 Feet Assistive device: Rolling walker (2 wheeled) Gait Pattern/deviations: Decreased step length - right;Decreased step length -  left;Step-through pattern;Shuffle;Trunk flexed;Narrow base of support Gait velocity: decr Gait velocity interpretation: <1.8 ft/sec, indicate of risk for recurrent falls General Gait Details: min guard for safety, verbal and tactile cues for navigation due to impaired sight   Stairs Stairs: Yes Stairs assistance: Min guard     General stair comments: unable to practice on actual stairs due to COVID restrictions, Pt able to utilize RW as rails and perform high stepping to simulate stair climbing, educated pt on having caregiver behind him supporting him at the waist to get up stairs into house       Balance Overall balance assessment: Needs assistance Sitting-balance support: Feet supported;No upper extremity supported Sitting balance-Leahy Scale: Fair     Standing balance support: No upper extremity supported Standing balance-Leahy Scale: Fair Standing balance comment: RW; pt pulling pants to waist                            Cognition Arousal/Alertness: Awake/alert Behavior During Therapy: Flat affect Overall Cognitive Status: Within Functional Limits for tasks assessed                                 General Comments: Pt cognition is improved today, able to follow commands and ask appropriate questions         General Comments General comments (skin integrity, edema, etc.): VSS on RA      Pertinent Vitals/Pain Pain Assessment: No/denies pain           PT Goals (current goals can  now be found in the care plan section) Acute Rehab PT Goals Patient Stated Goal: get a warm blanket PT Goal Formulation: With patient Time For Goal Achievement: 02/10/19 Potential to Achieve Goals: Fair Progress towards PT goals: Progressing toward goals    Frequency    Min 3X/week      PT Plan Discharge plan needs to be updated       AM-PAC PT "6 Clicks" Mobility   Outcome Measure  Help needed turning from your back to your side while in a flat  bed without using bedrails?: A Little Help needed moving from lying on your back to sitting on the side of a flat bed without using bedrails?: A Little Help needed moving to and from a bed to a chair (including a wheelchair)?: A Little Help needed standing up from a chair using your arms (e.g., wheelchair or bedside chair)?: A Little Help needed to walk in hospital room?: A Little Help needed climbing 3-5 steps with a railing? : Total 6 Click Score: 16    End of Session Equipment Utilized During Treatment: Gait belt Activity Tolerance: Patient limited by fatigue Patient left: with call bell/phone within reach;in chair;with chair alarm set Nurse Communication: Mobility status PT Visit Diagnosis: Difficulty in walking, not elsewhere classified (R26.2);Other abnormalities of gait and mobility (R26.89)     Time: [id number] PT Time Calculation (min) (ACUTE ONLY): 26 min  Charges:  $Gait Training: 23-37 mins                     Jakalyn Kratky B. Migdalia Dk PT, DPT Acute Rehabilitation Services Pager [phone] Office [phone]    [place] 02/04/2019, 5:14 PM

## 2019-02-04 NOTE — Progress Notes (Signed)
Patient ID: Francisco Williamson, male   DOB: 1945-12-23, 73 y.o.   MRN: [id number] S: No events overnight O:BP (!) 136/51 (BP Location: Right Arm)   Pulse 66   Temp 98.3 F (36.8 C) (Oral)   Resp (!) 26   Ht 5\' 5"  (1.651 m)   Wt 46.8 kg   SpO2 100%   BMI 17.17 kg/m   Intake/Output Summary (Last 24 hours) at 02/04/2019 1249 Last data filed at 02/04/2019 0800 Gross per 24 hour  Intake 734.39 ml  Output 1440 ml  Net -705.61 ml   Intake/Output: I/O last 3 completed shifts: In: 854.4 [P.O.:150; NG/GT:435; IV Piggyback:269.4] Out: 3440 [Urine:1440; Other:2000]  Intake/Output this shift:  Total I/O In: 120 [P.O.:120] Out: -  Weight change:  Physical exam: unable to complete due to COVID + status.  In order to preserve PPE equipment and to minimize exposure to providers.  Notes from other caregivers reviewed  Recent Labs  Lab 01/29/19 0415 01/30/19 0417 01/31/19 0430 02/01/19 0609 02/02/19 0559 02/03/19 0115 02/04/19 0435  NA 136 136 136 137 135 135 134*  K 4.3 4.3 4.0 3.8 3.9 4.5 4.4  CL 99 98 101 102 100 101 99  CO2 27 26 25 24 26 23 25   GLUCOSE 179* 129* 155* 103* 143* 143* 113*  BUN 47* 62* 69* 32* 50* 63* 53*  CREATININE 2.71* 3.23* 3.51* 2.58* 3.69* 4.00* 3.47*  ALBUMIN 1.7* 1.7* 1.6* 1.8* 1.7* 1.8* 1.8*  CALCIUM 7.4* 7.6* 7.5* 7.7* 7.6* 7.6* 7.9*  PHOS 3.8 3.5 3.8 3.7 3.8 4.7* 4.2   Liver Function Tests: Recent Labs  Lab 02/02/19 0559 02/03/19 0115 02/04/19 0435  ALBUMIN 1.7* 1.8* 1.8*   No results for input(s): LIPASE, AMYLASE in the last 168 hours. No results for input(s): AMMONIA in the last 168 hours. CBC: Recent Labs  Lab 01/29/19 0415 01/30/19 0417 02/02/19 0559 02/04/19 0435  WBC 10.4 11.4* 7.2 5.9  HGB 7.2* 8.0* 7.4* 8.1*  HCT 22.0* 24.3* 22.8* 25.0*  MCV 89.4 90.0 91.6 92.9  PLT 246 294 228 214   Cardiac Enzymes: No results for input(s): CKTOTAL, CKMB, CKMBINDEX, TROPONINI in the last 168 hours. CBG: Recent Labs  Lab 02/02/19 2347  02/03/19 0423 02/03/19 0735 02/03/19 1227 02/03/19 1710  GLUCAP 126* 125* 170* 184* 189*    Iron Studies: No results for input(s): IRON, TIBC, TRANSFERRIN, FERRITIN in the last 72 hours. Studies/Results: No results found. Marland Kitchen amLODipine  10 mg Oral Daily  . Chlorhexidine Gluconate Cloth  6 each Topical Q0600  . Chlorhexidine Gluconate Cloth  6 each Topical Q0600  . Chlorhexidine Gluconate Cloth  6 each Topical Q0600  . cloNIDine  0.2 mg Oral BID  . darbepoetin (ARANESP) injection - NON-DIALYSIS  150 mcg Subcutaneous Q Wed-1800  . feeding supplement (ENSURE ENLIVE)  237 mL Oral TID BM  . folic acid  1 mg Oral Daily  . hydrALAZINE  25 mg Oral Q8H  . magnesium oxide  400 mg Oral BID WC  . mouth rinse  15 mL Mouth Rinse BID  . metoprolol tartrate  50 mg Oral BID  . mupirocin ointment   Nasal BID  . pantoprazole  40 mg Oral BID  . predniSONE  10 mg Oral Q breakfast  . sodium chloride flush  3 mL Intravenous Q12H  . sulfamethoxazole-trimethoprim  1 tablet Oral Q M,W,F  . tacrolimus  4 mg Oral BID    BMET    Component Value Date/Time   NA 134 (L) 02/04/2019  0435   K 4.4 02/04/2019 0435   CL 99 02/04/2019 0435   CO2 25 02/04/2019 0435   GLUCOSE 113 (H) 02/04/2019 0435   BUN 53 (H) 02/04/2019 0435   CREATININE 3.47 (H) 02/04/2019 0435   CALCIUM 7.9 (L) 02/04/2019 0435   CALCIUM 8.7 12/19/2018 1428   GFRNONAA 17 (L) 02/04/2019 0435   GFRAA 19 (L) 02/04/2019 0435   CBC    Component Value Date/Time   WBC 5.9 02/04/2019 0435   RBC 2.69 (L) 02/04/2019 0435   HGB 8.1 (L) 02/04/2019 0435   HCT 25.0 (L) 02/04/2019 0435   PLT 214 02/04/2019 0435   MCV 92.9 02/04/2019 0435   MCH 30.1 02/04/2019 0435   MCHC 32.4 02/04/2019 0435   RDW 16.2 (H) 02/04/2019 0435   LYMPHSABS 0.8 01/28/2019 0415   MONOABS 0.7 01/28/2019 0415   EOSABS 0.0 01/28/2019 0415   BASOSABS 0.0 01/28/2019 0415     Assessment/Plan:  1. AKI/CKD stage 4-5- has had progressive CKD due to chronic allograft  nephropathy. Last Cr was up from 4 in July to 4.6 in October. Felt to be due to acute illness and volume depletion. He has had progressive azotemia and undergone 5 HD sessions with improvement. He is willing to continue with IHD if needed. However he has had some FTT over the past few months.  1. UOP dropped off on 11/29 but increased on 11/30.  2. Tolerated HD 02/03/19 3. Will follow UOP and trend in BUN/Cr before deciding on further sessions of HD.  4. UOP up to 1440 overnight.  Hold off on hd tomorrow pending trend in labs and uop.  2. Covid 19 infection- admitted to [facility] 10/28-11/3 for covid 19 PNA and acute respiratory failure with hypoxia. He completed course of remdesivir and steroids but continued to c/o fatigue and anorexia with FTT. Now on room air 3. H/o kidney transplant in April [id number] at [facility] complicated by polyoma virus and chronic allograft nephropathy. 4. FTT- agree with palliative care consult for goals/limits of care 5. HTN- stable 6. GIB- +FOBT. Follow h/h  7. Severe protein malnutrition- on supplements.  Donetta Potts, MD [employer] [phone]

## 2019-02-04 NOTE — Progress Notes (Signed)
Patient very upset.  States he was told that he would be discharged today and has been waiting for this to happen.  He states he feels he is ready for discharge and has rides to HD.  He states he would accept OT and PT to his home, but doesn't want to stay here.  He is alert and oriented.  CM and LSW in touch with Probation officer and with MD to orchestrate a safe discharge disposition for patient which he would consider.  MD aware.

## 2019-02-04 NOTE — Progress Notes (Signed)
Occupational Therapy Treatment Patient Details Name: Francisco Williamson MRN: [id number] DOB: 01-Nov-1945 Today's Date: 02/04/2019    History of present illness 73 yo male presenting with AMS and failure to thrive, renal failure. Pt transferred to [facility] and HD initiated 11/19. Pt was admitted to [place] from 10/28-11/3 for COVID + and PNA. 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   OT comments  Pt progressing well. Pt set-upA for dressing tasks. Pt presenting with fair activity tolerance and set-upA for ADL tasks. Pt denied need for grooming at sink after transferring to recliner. Pt using RW for short walk to recliner. Pt given warm blanket. Pt stood for donning pants. Pt appears closer to baseline functioning. Pt would benefit from continued OT skilled services. Goals updated. OT following. O2>90% on RA.    Follow Up Recommendations  Home health OT;SNF;Supervision - Intermittent(Pt had progressed to [facility] services)    Equipment Recommendations  3 in 1 bedside commode;Tub/shower seat    Recommendations for Other Services      Precautions / Restrictions Precautions Precautions: Fall Restrictions Weight Bearing Restrictions: No       Mobility Bed Mobility Overal bed mobility: Needs Assistance Bed Mobility: Supine to Sit     Supine to sit: HOB elevated;Min assist Sit to supine: Min guard   General bed mobility comments: Assist to elevate trunk into sitting.   Transfers Overall transfer level: Needs assistance Equipment used: Rolling walker (2 wheeled) Transfers: Sit to/from [facility] Sit to Stand: Min guard Stand pivot transfers: Min guard       General transfer comment: No assist for bed mobility or transfer, just hands on in case    Balance Overall balance assessment: Needs assistance Sitting-balance support: Feet supported;No upper extremity supported Sitting balance-Leahy Scale: Fair       Standing balance-Leahy  Scale: Fair Standing balance comment: RW; pt pulling pants to waist                           ADL either performed or assessed with clinical judgement   ADL                   Upper Body Dressing : Set up;Sitting   Lower Body Dressing: Set up;Sitting/lateral leans;Sit to/from stand   Toilet Transfer: Min guard;Stand-pivot;Cueing for safety;RW   Toileting- Water quality scientist and Hygiene: Min guard;Cueing for safety;Cueing for sequencing;Sitting/lateral lean;Sit to/from stand       Functional mobility during ADLs: Min guard;Cueing for safety;Rolling walker General ADL Comments: Pt presenting with fair activity tolerance and set-upA for ADL tasks. Pt denied need for grooming at sink after transferring to recliner.     Vision   Vision Assessment?: No apparent visual deficits   Perception     Praxis      Cognition Arousal/Alertness: Awake/alert Behavior During Therapy: Flat affect Overall Cognitive Status: Within Functional Limits for tasks assessed                                 General Comments: Pt following commands today        Exercises     Shoulder Instructions       General Comments      Pertinent Vitals/ Pain       Pain Assessment: No/denies pain  Home Living  Prior Functioning/Environment              Frequency  Min 2X/week        Progress Toward Goals  OT Goals(current goals can now be found in the care plan section)  Progress towards OT goals: Progressing toward goals  Acute Rehab OT Goals Patient Stated Goal: get a warm blanket OT Goal Formulation: With patient Time For Goal Achievement: 02/18/19 Potential to Achieve Goals: Good ADL Goals Pt Will Perform Grooming: with supervision;standing Pt Will Perform Lower Body Dressing: with min guard assist;sitting/lateral leans;sit to/from stand Pt Will Transfer to Toilet: with supervision;grab  bars;regular height toilet Pt Will Perform Toileting - Clothing Manipulation and hygiene: with modified independence;sitting/lateral leans;sit to/from stand Additional ADL Goal #1: Pt will demonstrated increased STM to recall and perform BADL tasks with 1-2 cues for safe and successful completion of task Additional ADL Goal #2: Pt will recall and apply 1-3 ECS techniques to BADL task for safe and successful completion  Plan Discharge plan remains appropriate;Frequency needs to be updated    Co-evaluation                 AM-PAC OT "6 Clicks" Daily Activity     Outcome Measure   Help from another person eating meals?: None Help from another person taking care of personal grooming?: A Little Help from another person toileting, which includes using toliet, bedpan, or urinal?: None Help from another person bathing (including washing, rinsing, drying)?: A Little Help from another person to put on and taking off regular upper body clothing?: None Help from another person to put on and taking off regular lower body clothing?: None 6 Click Score: 22    End of Session Equipment Utilized During Treatment: Gait belt;Rolling walker  OT Visit Diagnosis: Unsteadiness on feet (R26.81);Muscle weakness (generalized) (M62.81);Pain Pain - Right/Left: Right   Activity Tolerance Patient limited by fatigue   Patient Left in chair;with call bell/phone within reach;with chair alarm set   Nurse Communication Mobility status        Time: [id number] OT Time Calculation (min): 28 min  Charges: OT General Charges $OT Visit: 1 Visit OT Treatments $Self Care/Home Management : 23-37 mins  Ebony Hail Harold Hedge) Marsa Aris OTR/L Acute Rehabilitation Services Pager: [phone] Office: [place] 02/04/2019, 2:18 PM

## 2019-02-04 NOTE — Progress Notes (Signed)
Patient in much better spirits after speaking with Education officer, museum.  Had very productive conversation, which appeared to strike a compromise that was beneficial to the patient while giving him hope for a future with more independence.

## 2019-02-04 NOTE — TOC Progression Note (Signed)
Transition of Care [facility]) - Progression Note    Patient Details  Name: Francisco Williamson MRN: [id number] Date of Birth: [birth year]  Transition of Care [facility]) CM/SW [place], [place] Phone Number: 02/04/2019, 4:53 PM  Clinical Narrative:   CSW received call from RN that patient was requesting discharge home, that therapy had seen him today and he has progress to home health. CSW discussed with MD and Renal Navigator, and patient is not yet stable from a nephrology stand point. Patient indicated understanding, and agreed to be patient for a few more days, especially if he may not need dialysis with his kidneys improving. Patient hopeful that he won't need dialysis but would be willing to continue if he needed it. Patient said he has a friend, Lorna Dibble, who helps him with things and asked if CSW would call her. CSW discussed with patient home health and equipment needs, and patient says he has used [facility] in the past and already has walker, 3N1, and tub bench at home. CSW updated RN on discussion with patient.  CSW called Mattie at the number listed and left a voicemail to update her per patient request. Awaiting call back. CSW to follow.    Expected Discharge Plan: [place] Barriers to Discharge: Continued Medical Work up  Expected Discharge Plan and Services Expected Discharge Plan: [place] In-house Referral: Clinical Social Work   Post Acute Care Choice: [place] Living arrangements for the past 2 months: Single Family Home                           HH Arranged: NA           Social Determinants of Health (SDOH) Interventions    Readmission Risk Interventions No flowsheet data found.

## 2019-02-05 LAB — GLUCOSE, CAPILLARY
Glucose-Capillary: 175 mg/dL — ABNORMAL HIGH (ref 70–99)
Glucose-Capillary: 107 mg/dL — ABNORMAL HIGH (ref 70–99)
Glucose-Capillary: 107 mg/dL — ABNORMAL HIGH (ref 70–99)
Glucose-Capillary: 111 mg/dL — ABNORMAL HIGH (ref 70–99)
Glucose-Capillary: 122 mg/dL — ABNORMAL HIGH (ref 70–99)
Glucose-Capillary: 133 mg/dL — ABNORMAL HIGH (ref 70–99)
Glucose-Capillary: 151 mg/dL — ABNORMAL HIGH (ref 70–99)
Glucose-Capillary: 169 mg/dL — ABNORMAL HIGH (ref 70–99)
Glucose-Capillary: 131 mg/dL — ABNORMAL HIGH (ref 70–99)

## 2019-02-05 LAB — RENAL FUNCTION PANEL
Albumin: 2 g/dL — ABNORMAL LOW (ref 3.5–5.0)
Anion gap: 11 (ref 5–15)
BUN: 61 mg/dL — ABNORMAL HIGH (ref 8–23)
CO2: 24 mmol/L (ref 22–32)
Calcium: 7.8 mg/dL — ABNORMAL LOW (ref 8.9–10.3)
Chloride: 100 mmol/L (ref 98–111)
Creatinine, Ser: 4.44 mg/dL — ABNORMAL HIGH (ref 0.61–1.24)
GFR calc Af Amer: 14 mL/min — ABNORMAL LOW (ref >60)
GFR calc non Af Amer: 12 mL/min — ABNORMAL LOW (ref >60)
Glucose, Bld: 110 mg/dL — ABNORMAL HIGH (ref 70–99)
Phosphorus: 4.9 mg/dL — ABNORMAL HIGH (ref 2.5–4.6)
Potassium: 4.5 mmol/L (ref 3.5–5.1)
Sodium: 135 mmol/L (ref 135–145)

## 2019-02-05 LAB — CBC
HCT: 26.5 % — ABNORMAL LOW (ref 39.0–52.0)
Hemoglobin: 8.7 g/dL — ABNORMAL LOW (ref 13.0–17.0)
MCH: 30.3 pg (ref 26.0–34.0)
MCHC: 32.8 g/dL (ref 30.0–36.0)
MCV: 92.3 fL (ref 80.0–100.0)
Platelets: 191 10*3/uL (ref 150–400)
RBC: 2.87 MIL/uL — ABNORMAL LOW (ref 4.22–5.81)
RDW: 16 % — ABNORMAL HIGH (ref 11.5–15.5)
WBC: 4.6 10*3/uL (ref 4.0–10.5)
nRBC: 0 % (ref 0.0–0.2)

## 2019-02-05 NOTE — Progress Notes (Signed)
PT Cancellation Note  Patient Details Name: Francisco Williamson MRN: [id number] DOB: 01/07/1946   Cancelled Treatment:    Reason Eval/Treat Not Completed: (P) Patient at procedure or test/unavailable Pt is off floor for HD. PT will follow back tomorrow for treatment.   Meryem Haertel B. Migdalia Dk PT, DPT Acute Rehabilitation Services Pager [phone] Office [phone]    [place] 02/05/2019, 4:18 PM

## 2019-02-05 NOTE — Progress Notes (Signed)
SLP Cancellation Note  Patient Details Name: Francisco Williamson MRN: [id number] DOB: March 25, 1945   Cancelled treatment:    Pt in HD. Will continue efforts.  Judson Tsan L. Tivis Ringer, [place] CCC/SLP Acute Rehabilitation Services Office number [phone] Pager [phone]        Assunta Curtis 02/05/2019, 3:34 PM

## 2019-02-05 NOTE — Progress Notes (Signed)
Patient has declined all fluids that I have offered him and has not passed any urine since my shift began.

## 2019-02-05 NOTE — Procedures (Signed)
I was present at this dialysis session. I have reviewed the session itself and made appropriate changes.  Sitting in chair comfortably, no edema, Lavf bfr 300.  Doing well and understands that he has to wait for outpatient HD unit before he can be discharged.   Vital signs in last 24 hours:  Temp:  [98.2 F (36.8 C)-98.8 F (37.1 C)] 98.3 F (36.8 C) (12/03 1201) Pulse Rate:  [61-68] 66 (12/03 1201) Resp:  [17-25] 25 (12/03 1201) BP: (114-143)/(60-70) 115/61 (12/03 1201) SpO2:  [99 %-100 %] 100 % (12/03 1201) Weight:  [47.2 kg] 47.2 kg (12/03 0500) Weight change: 0 kg Filed Weights   02/03/19 1100 02/04/19 0500 02/05/19 0500  Weight: 45.2 kg 46.8 kg 47.2 kg    Recent Labs  Lab 02/05/19 0513  NA 135  K 4.5  CL 100  CO2 24  GLUCOSE 110*  BUN 61*  CREATININE 4.44*  CALCIUM 7.8*  PHOS 4.9*    Recent Labs  Lab 02/02/19 0559 02/04/19 0435 02/05/19 0513  WBC 7.2 5.9 4.6  HGB 7.4* 8.1* 8.7*  HCT 22.8* 25.0* 26.5*  MCV 91.6 92.9 92.3  PLT 228 214 191    Scheduled Meds: . amLODipine  10 mg Oral Daily  . cloNIDine  0.2 mg Oral BID  . darbepoetin (ARANESP) injection - NON-DIALYSIS  150 mcg Subcutaneous Q Wed-1800  . feeding supplement (ENSURE ENLIVE)  237 mL Oral TID BM  . folic acid  1 mg Oral Daily  . hydrALAZINE  25 mg Oral Q8H  . magnesium oxide  400 mg Oral BID WC  . mouth rinse  15 mL Mouth Rinse BID  . metoprolol tartrate  50 mg Oral BID  . mupirocin ointment   Nasal BID  . mycophenolate  500 mg Oral BID  . pantoprazole  40 mg Oral BID  . predniSONE  10 mg Oral Q breakfast  . sodium chloride flush  3 mL Intravenous Q12H  . sulfamethoxazole-trimethoprim  1 tablet Oral Q M,W,F  . tacrolimus  4 mg Oral BID   Continuous Infusions: . sodium chloride 10 mL/hr at 01/28/19 2211   PRN Meds:.sodium chloride, acetaminophen, albuterol, benzonatate, calcium carbonate (dosed in mg elemental calcium), camphor-menthol **AND** hydrOXYzine, docusate sodium, docusate sodium,  hydrALAZINE, labetalol, loperamide, oxyCODONE, phenol, sodium chloride flush, zolpidem    Assessment/Plan:  1. 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 Last Cr was up from 4 in July to 4.6 in October. Felt to be due to acute illness and volume depletion. He has had progressive azotemia and undergone 5 HD sessions with improvement. He is willing to continue with IHD if needed. However he has had some FTT over the past few months.  1. UOP droppedoff on 11/29 but increased on 11/30. 2. Tolerated HD 02/03/19 3. Willfollow UOP and trend in BUN/Cr before deciding on furthersessionsof HD.  4. UOP up to 1440 yesterday but down to 250 today with worsening BUN/Cr 5. Plan for HD today and continue with TTS schedule.   6. Will need outpatient dialysis arrangements before he can be discharged.  2. Covid 19 infection- admitted to [facility] 10/28-11/3 for covid 19 PNA and acute respiratory failure with hypoxia. He completed course of remdesivir and steroids but continued to c/o fatigue and anorexia with FTT. Now on room air and without SOB. 3. H/o kidney transplant in April [id number] at [facility] complicated by polyoma virus and chronic allograft nephropathy. 4. FTT- agree with palliative  care consult for goals/limits of care 5. HTN- stable 6. GIB- +FOBT. Follow h/h  7. Severe protein malnutrition- on supplements.  Donetta Potts,  MD 02/05/2019, 4:20 PM

## 2019-02-05 NOTE — Progress Notes (Signed)
PROGRESS NOTE    Francisco Williamson  [id number] DOB: 1945-07-24 DOA: 01/11/2019 PCP: Default, Provider, MD    Brief Narrative:  73 year old male with a history of hypertension, CKDstageV progressive due to chronic allograft nephropathy presented to the hospital with altered mental status.    Admitted to [facility], 10/28 -11/3 with COVID-19 associated pneumonia with acute respiratory failure with hypoxia. He completed course of remdesivir and steroids and was discharged home on 01/06/2019.  Brought back to the emergency room on 01/11/2019 with failure to thrive, dehydration, altered mental status and renal failure.  Initially treated for healthcare associated pneumonia.  Oxygen requirement worsened on 01/17/2019 and was treated with another dose of steroids, remdesivir and convalescent plasma.  He was transferred to [facility] for intermittent hemodialysis. 01/23/2019, he decompensated again with altered mentation respiratory distress.  Initially required core track feeding, now eating by mouth. Patient is waiting to have outpatient dialysis schedule available.   Assessment & Plan:   Principal Problem:   Acute renal failure superimposed on chronic kidney disease ([place]) Active Problems:   CKD stage 5 secondary to hypertension ([place])   Pneumonia due to COVID-19 virus   Essential hypertension   History of renal transplant   Severe sepsis (HCC)   Sepsis secondary to UTI ([place])   HCAP (healthcare-associated pneumonia)   Bilateral pleural effusion   Acute hypernatremia   Goals of care, counseling/discussion   Palliative care by specialist   Protein-calorie malnutrition, severe   Adult failure to thrive   Weakness generalized   Encounter for hospice care discussion  Acute hypoxic respiratory failure, recurrent sepsis due to HCAP/COVID-19 pneumonia: Adequately improved.  Treated with multiple rounds of antibiotics, steroids, remdesivir and convalescent plasma.  Currently afebrile and on  room air.  Acute renal injury on CKD stage IV with history of renal transplant: Followed by nephrology.  Currently requiring intermittent hemodialysis.  Also on Prograf, CellCept and Bactrim prophylaxis. Patient will need outpatient intermittent hemodialysis. Waiting for availability.  Failure to thrive, deconditioning, unable to take care of himself: Some clinical improvement today.  Currently on dysphagia 3 diet.  NG feeding discontinued.  Encourage oral intake.  Staph epidermidis UTI: Treated with vancomycin.  Hypertension: On multiple medications and controlled.  GI bleeding with chronic blood loss anemia: Received total 2 units of PRBC.  Hemoglobin is stable since then.   DVT prophylaxis: SCDs Code Status: DNR Family Communication: Patient's daughter on the phone Disposition Plan: Home with home health OT PT once dialysis plans are made.  Needs to have dialysis chair.   Consultants:   Nephrology  Procedures:   Hemodialysis  Antimicrobials: All finished. Anti-infectives (From admission, onward)   Start     Dose/Rate Route Frequency Ordered Stop   01/28/19 1347  vancomycin (VANCOCIN) 500-5 MG/100ML-% IVPB    Note to Pharmacy: Cherylann Banas   : cabinet override      01/28/19 1347 01/28/19 1615   01/28/19 1200  vancomycin (VANCOCIN) IVPB 500 mg/100 ml premix     500 mg 100 mL/hr over 60 Minutes Intravenous Every Wed (Hemodialysis) 01/28/19 1034 01/28/19 1713   01/27/19 0800  vancomycin (VANCOCIN) 500 mg in sodium chloride 0.9 % 100 mL IVPB     500 mg 100 mL/hr over 60 Minutes Intravenous  Once 01/27/19 0714 01/27/19 1140   01/24/19 1800  ceFEPIme (MAXIPIME) 1 g in sodium chloride 0.9 % 100 mL IVPB     1 g 200 mL/hr over 30 Minutes Intravenous Every 24 hours 01/23/19 1122 01/29/19  1841   01/23/19 1200  ceFEPIme (MAXIPIME) 1 g in sodium chloride 0.9 % 100 mL IVPB     1 g 200 mL/hr over 30 Minutes Intravenous  Once 01/23/19 1122 01/23/19 1452   01/23/19 1130  vancomycin  (VANCOCIN) IVPB 1000 mg/200 mL premix     1,000 mg 200 mL/hr over 60 Minutes Intravenous  Once 01/23/19 1122 01/23/19 1623   01/23/19 1123  vancomycin variable dose per unstable renal function (pharmacist dosing)  Status:  Discontinued      Does not apply See admin instructions 01/23/19 1123 01/28/19 1035   01/17/19 1300  remdesivir 100 mg in sodium chloride 0.9 % 250 mL IVPB     100 mg 500 mL/hr over 30 Minutes Intravenous Daily 01/17/19 1144 01/21/19 1106   01/16/19 1800  vancomycin (VANCOCIN) 500 mg in sodium chloride 0.9 % 100 mL IVPB     500 mg 100 mL/hr over 60 Minutes Intravenous  Once 01/13/19 1758 01/16/19 2323   01/13/19 1900  vancomycin (VANCOCIN) IVPB 1000 mg/200 mL premix     1,000 mg 200 mL/hr over 60 Minutes Intravenous  Once 01/13/19 1758 01/13/19 2200   01/12/19 1800  ceFEPIme (MAXIPIME) 1 g in sodium chloride 0.9 % 100 mL IVPB     1 g 200 mL/hr over 30 Minutes Intravenous Every 24 hours 01/11/19 0910 01/17/19 1904   01/12/19 1600  sulfamethoxazole-trimethoprim (BACTRIM) 400-80 MG per tablet 1 tablet    Note to Pharmacy: Take one tablet by mouth on Monday, Wednesday and fridays.     1 tablet Oral Every M-W-F 01/12/19 1449     01/11/19 0830  vancomycin (VANCOCIN) IVPB 1000 mg/200 mL premix  Status:  Discontinued     1,000 mg 200 mL/hr over 60 Minutes Intravenous  Once 01/11/19 0825 01/11/19 0901   01/11/19 0830  ceFEPIme (MAXIPIME) 2 g in sodium chloride 0.9 % 100 mL IVPB     2 g 200 mL/hr over 30 Minutes Intravenous  Once 01/11/19 0825 01/11/19 0952         Subjective: Patient seen and examined.  Afebrile.  On room air.  Denies any chest pain shortness of breath.   " I ate good breakfast"   Objective: Vitals:   02/05/19 0500 02/05/19 0752 02/05/19 1100 02/05/19 1201  BP:  134/65 121/64 115/61  Pulse:  65 68 66  Resp:  (!) 22 (!) 22 (!) 25  Temp:  98.2 F (36.8 C)  98.3 F (36.8 C)  TempSrc:  Oral  Oral  SpO2:  99% 100% 100%  Weight: 47.2 kg     Height:         Intake/Output Summary (Last 24 hours) at 02/05/2019 1340 Last data filed at 02/05/2019 0940 Gross per 24 hour  Intake 150 ml  Output 500 ml  Net -350 ml   Filed Weights   02/03/19 1100 02/04/19 0500 02/05/19 0500  Weight: 45.2 kg 46.8 kg 47.2 kg    Examination:  General exam: Appears calm and comfortable, on room air.  Chronically sick looking.  Debilitated. Respiratory system: Clear to auscultation. Respiratory effort normal.  No added sounds. Cardiovascular system: S1 & S2 heard, RRR. No JVD, murmurs, rubs, gallops or clicks. No pedal edema. Gastrointestinal system: Abdomen is nondistended, soft and nontender. No organomegaly or masses felt. Normal bowel sounds heard. Central nervous system: Alert and oriented. No focal neurological deficits. Extremities: Symmetric 5 x 5 power. Skin: No rashes, lesions or ulcers, left upper extremity AV fistula present. Psychiatry:  Judgement and insight appear normal. Mood & affect flat and anxious.    Data Reviewed: I have personally reviewed following labs and imaging studies  CBC: Recent Labs  Lab 01/30/19 0417 02/02/19 0559 02/04/19 0435 02/05/19 0513  WBC 11.4* 7.2 5.9 4.6  HGB 8.0* 7.4* 8.1* 8.7*  HCT 24.3* 22.8* 25.0* 26.5*  MCV 90.0 91.6 92.9 92.3  PLT 294 228 214 [id number]   Basic Metabolic Panel: Recent Labs  Lab 02/01/19 0609 02/02/19 0559 02/03/19 0115 02/04/19 0435 02/05/19 0513  NA 137 135 135 134* 135  K 3.8 3.9 4.5 4.4 4.5  CL 102 100 101 99 100  CO2 24 26 23 25 24   GLUCOSE 103* 143* 143* 113* 110*  BUN 32* 50* 63* 53* 61*  CREATININE 2.58* 3.69* 4.00* 3.47* 4.44*  CALCIUM 7.7* 7.6* 7.6* 7.9* 7.8*  MG  --  2.0  --   --   --   PHOS 3.7 3.8 4.7* 4.2 4.9*   GFR: Estimated Creatinine Clearance: 10.2 mL/min (A) (by C-G formula based on SCr of 4.44 mg/dL (H)). Liver Function Tests: Recent Labs  Lab 02/01/19 0609 02/02/19 0559 02/03/19 0115 02/04/19 0435 02/05/19 0513  ALBUMIN 1.8* 1.7* 1.8* 1.8* 2.0*    No results for input(s): LIPASE, AMYLASE in the last 168 hours. No results for input(s): AMMONIA in the last 168 hours. Coagulation Profile: No results for input(s): INR, PROTIME in the last 168 hours. Cardiac Enzymes: No results for input(s): CKTOTAL, CKMB, CKMBINDEX, TROPONINI in the last 168 hours. BNP (last 3 results) No results for input(s): PROBNP in the last 8760 hours. HbA1C: No results for input(s): HGBA1C in the last 72 hours. CBG: Recent Labs  Lab 02/03/19 1227 02/03/19 1710 02/04/19 1931 02/04/19 2220 02/05/19 1158  GLUCAP 184* 189* 160* 104* 175*   Lipid Profile: No results for input(s): CHOL, HDL, LDLCALC, TRIG, CHOLHDL, LDLDIRECT in the last 72 hours. Thyroid Function Tests: No results for input(s): TSH, T4TOTAL, FREET4, T3FREE, THYROIDAB in the last 72 hours. Anemia Panel: No results for input(s): VITAMINB12, FOLATE, FERRITIN, TIBC, IRON, RETICCTPCT in the last 72 hours. Sepsis Labs: No results for input(s): PROCALCITON, LATICACIDVEN in the last 168 hours.  Recent Results (from the past 240 hour(s))  SARS Coronavirus 2 by RT PCR (hospital order, performed in [facility] hospital lab) Nasopharyngeal Nasopharyngeal Swab     Status: Abnormal   Collection Time: 01/28/19  6:00 PM   Specimen: Nasopharyngeal Swab  Result Value Ref Range Status   SARS Coronavirus 2 POSITIVE (A) NEGATIVE Final    Comment: RESULT CALLED TO, READ BACK BY AND VERIFIED WITH: B RODDY RN 01/28/19 1903 JDW (NOTE) SARS-CoV-2 target nucleic acids are DETECTED SARS-CoV-2 RNA is generally detectable in upper respiratory specimens  during the acute phase of infection.  Positive results are indicative  of the presence of the identified virus, but do not rule out bacterial infection or co-infection with other pathogens not detected by the test.  Clinical correlation with patient history and  other diagnostic information is necessary to determine patient infection status.  The expected result  is negative. Fact Sheet for Patients:   StrictlyIdeas.no  Fact Sheet for Healthcare Providers:   BankingDealers.co.za   This test is not yet approved or cleared by the [place] FDA and  has been authorized for detection and/or diagnosis of SARS-CoV-2 by FDA under an Emergency Use Authorization (EUA).  This EUA will remain in effect (meaning this test can be used ) for the duration of  the COVID-19 declaration under Section 564(b)(1) of the Act, 21 U.S.C. section 360-bbb-3(b)(1), unless the authorization is terminated or revoked sooner. Performed at [facility], [place] [address] [place], [place] [address]          Radiology Studies: No results found.      Scheduled Meds: . amLODipine  10 mg Oral Daily  . cloNIDine  0.2 mg Oral BID  . darbepoetin (ARANESP) injection - NON-DIALYSIS  150 mcg Subcutaneous Q Wed-1800  . feeding supplement (ENSURE ENLIVE)  237 mL Oral TID BM  . folic acid  1 mg Oral Daily  . hydrALAZINE  25 mg Oral Q8H  . magnesium oxide  400 mg Oral BID WC  . mouth rinse  15 mL Mouth Rinse BID  . metoprolol tartrate  50 mg Oral BID  . mupirocin ointment   Nasal BID  . mycophenolate  500 mg Oral BID  . pantoprazole  40 mg Oral BID  . predniSONE  10 mg Oral Q breakfast  . sodium chloride flush  3 mL Intravenous Q12H  . sulfamethoxazole-trimethoprim  1 tablet Oral Q M,W,F  . tacrolimus  4 mg Oral BID   Continuous Infusions: . sodium chloride 10 mL/hr at 01/28/19 2211     LOS: 25 days    Time spent: 25 minutes    Barb Merino, MD [facility] Pager [phone]

## 2019-02-05 NOTE — Progress Notes (Signed)
Renal Navigator notified by Dr. Coladonato/Nephrologist to refer patient for OP HD treatment. Patient referred to Emilie Rutter isolation shift, which is TTS 12:00pm due to current COVID positive status.  Renal Navigator will update patient and medical team once patient has been accepted.  Alphonzo Cruise, [place] Renal Navigator [phone]

## 2019-02-06 ENCOUNTER — Other Ambulatory Visit: Payer: Self-pay

## 2019-02-06 DIAGNOSIS — Z992 Dependence on renal dialysis: Secondary | ICD-10-CM

## 2019-02-06 DIAGNOSIS — N189 Chronic kidney disease, unspecified: Secondary | ICD-10-CM

## 2019-02-06 LAB — GLUCOSE, CAPILLARY: Glucose-Capillary: 97 mg/dL (ref 70–99)

## 2019-02-06 MED ORDER — TACROLIMUS 1 MG PO CAPS: 4.0000 mg | ORAL_CAPSULE | Freq: Two times a day (BID) | ORAL | Status: AC

## 2019-02-06 NOTE — Progress Notes (Signed)
Patient has been accepted for OP HD treatment at the [place] isolation shift at [facility] clinic on a TTS schedule with a seat time of 12:00pm. He needs to arrive to the parking lot at 11:45am and call the clinic at [phone] and wait to be called in. Bedside RN aware and Renal NP aware to send orders. Plan for discharge today with start in the clinic tomorrow, Saturday, 02/07/19. Navigator has confirmed with CSW/L. Ward Chatters that patient's friend Lorna Dibble can provide transportation to OP HD clinic. She has already spoken to patient about this. When patient meets criteria to move to a negative shift, he will be transferred to [place] clinic, closer to his home.  [facility] [address], [place], [place]  Alphonzo Cruise, [place] Renal Navigator [phone]

## 2019-02-06 NOTE — Consult Note (Signed)
Spoke with Zollie Beckers who is the care giver for the patient, HIPAA verified, she states patient was asleep after coming home.  She states that patient could benefit from any help available.  Explained [facility] program and that will have someone follow up with patient next week.  Patient to start HD tomorrow.  For questions, please contact:  Natividad Brood, RN BSN [facility] Liaison  [phone] business mobile phone Toll free office [phone]  Fax number: [phone] [email or web address]

## 2019-02-06 NOTE — Progress Notes (Signed)
Patient ID: Francisco Williamson, male   DOB: [birth year], 73 y.o.   MRN: [id number] Plans appearing for possible discharge home today to begin hemodialysis at the [facility] unit (dedicated Covid shift) starting tomorrow.  I will follow up on the status of this again this afternoon and formally see the patient if still here.  Elmarie Shiley MD [facility]. Office # [phone] Pager # [phone] 9:49 AM

## 2019-02-06 NOTE — Discharge Summary (Signed)
Physician Discharge Summary  Francisco Williamson [id number] DOB: [birth year] DOA: 01/11/2019  PCP: Default, Provider, MD  Admit date: 01/11/2019 Discharge date: 02/06/2019  Admitted From: Home. Disposition: Home with home health  Recommendations for Outpatient Follow-up:  1. Follow up with PCP in 1-2 weeks 2. 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   10/28-11/3, Admitted to [facility] with COVID-19 associated pneumonia with acute respiratory failure with hypoxia. He completed course of remdesivir and steroids and was discharged home on 01/06/2019.  01/11/2011, blood back to the emergency room with failure to thrive, dehydration, altered mental status and renal failure.  Initially treated for healthcare associated pneumonia.  Oxygen requirement worsened on 01/17/2019 and was treated with another dose of steroids, remdesivir and convalescent plasma.  He was transferred to [facility] for intermittent hemodialysis.  01/23/2019, he decompensated again with altered mentation respiratory distress.  Initially required core track feeding, now eating by mouth. Now with clinical improvement.  Going home.  Treated for following conditions.  Acute hypoxic respiratory failure, recurrent sepsis due to HCAP/COVID-19 pneumonia: Adequately improved.  Treated with multiple rounds of antibiotics, steroids, remdesivir and convalescent plasma.  Currently afebrile and on room air.  Acute renal injury on CKD stage IV with history of renal transplant:  Followed by nephrology.  Currently requiring intermittent hemodialysis.  Also on Prograf, CellCept and Bactrim prophylaxis. Patient needing outpatient scheduled  hemodialysis that is scheduled today. He is going home on TTS schedule, next dialysis tomorrow. Patient is on prednisone 5 mg twice a day, will continue. Patient is on Prograf 4 mg twice a day.we will continue. Patient is on CellCept 500 mg twice a day, I discussed with nephrology and they recommended to discontinue.  He will follow up with transplant nephrology team, patient will probably be tapered off the immunosuppressants as now he is dialysis dependent.  Defer to outpatient transplant team.  Failure to thrive, deconditioning, unable to take care of himself: Patient was initially on very poor clinical situation.  Now adequately improving.  Wanted to continue with hemodialysis and continue to recover.  Going home with home PT OT with adequate support.  Staph epidermidis UTI: Treated with vancomycin.  Finished course.  Hypertension: On multiple medications and controlled.  GI bleeding with chronic blood loss anemia: Received total 2 units of PRBC.  Hemoglobin is stable since then.  Will resume aspirin.  Patient is chronically sick, however currently adequately stabilized to go home and continue outpatient dialysis.  Discharge Diagnoses:  Principal Problem:   Acute renal failure superimposed on chronic kidney disease ([place]) Active Problems:   CKD stage 5 secondary to hypertension ([place])   Pneumonia due to COVID-19 virus   Essential hypertension   History of renal transplant   Severe sepsis (HCC)   Sepsis secondary to UTI ([place])   HCAP (healthcare-associated pneumonia)   Bilateral pleural effusion   Acute hypernatremia   Goals of care, counseling/discussion   Palliative care by specialist   Protein-calorie malnutrition, severe   Adult failure to thrive   Weakness generalized   Encounter for hospice care discussion    Discharge Instructions  Discharge Instructions    Call MD for:  difficulty breathing, headache or visual disturbances   Complete by: As directed    Call MD  for:  temperature >100.4   Complete by: As directed    Diet - low sodium heart healthy   Complete by: As directed    Increase activity slowly   Complete by: As directed      Allergies as of 02/06/2019   No Known Allergies     Medication List    STOP taking these medications   Darbepoetin Alfa 300 MCG/0.6ML Sosy injection Commonly known as: ARANESP   mycophenolate 500 MG tablet Commonly known as: CELLCEPT   ondansetron 4 MG tablet Commonly known as: ZOFRAN   traZODone 50 MG tablet Commonly known as: DESYREL     TAKE these medications   acetaminophen 325 MG tablet Commonly known as: TYLENOL Take 2 tablets (650 mg total) by mouth every 6 (six) hours as needed for mild pain or headache (fever >/= 101).   amLODipine 10 MG tablet Commonly known as: NORVASC Take 10 mg by mouth daily.   ascorbic acid 500 MG tablet Commonly known as: VITAMIN C Take 1 tablet (500 mg total) by mouth daily.   aspirin EC 81 MG tablet Take 81 mg by mouth daily.   calcitRIOL 0.5 MCG capsule Commonly known as: ROCALTROL Take 0.5 mcg by mouth daily.   cloNIDine 0.2 MG tablet Commonly known as: CATAPRES Take 1 tablet (0.2 mg total) by mouth 2 (two) times daily.   ferrous sulfate 325 (65 FE) MG EC tablet Take 325 mg by mouth 2 (two) times daily.   Ipratropium-Albuterol 20-100 MCG/ACT Aers respimat Commonly known as: COMBIVENT Inhale 1 puff into the lungs every 6 (six) hours.   labetalol 300 MG tablet Commonly known as: NORMODYNE Take 1 tablet (300 mg total) by mouth 3 (three) times daily. What changed: when to take this   magnesium oxide 400 MG tablet Commonly known as: MAG-OX Take 400 mg by mouth 2 (two) times daily.   pantoprazole 40 MG tablet Commonly known as: PROTONIX Take 40 mg by mouth daily.   PREDNISONE PO Take 5 mg by mouth 2 (two) times daily.   sildenafil 100 MG tablet Commonly known as: VIAGRA Take 100 mg by mouth as needed for erectile dysfunction.   sodium  bicarbonate 650 MG tablet Take 650 mg by mouth 3 (three) times daily.   sulfamethoxazole-trimethoprim 400-80 MG tablet Commonly known as: BACTRIM Take 1 tablet by mouth every Monday, Wednesday, and Friday. Take one tablet by mouth on Monday, Wednesday and fridays.   tacrolimus 1 MG capsule Commonly known as: PROGRAF Take 4 capsules (4 mg total) by mouth 2 (two) times daily. What changed: how much to take   zinc sulfate 220 (50 Zn) MG capsule Take 1 capsule (220 mg total) by mouth daily.      Contact information for after-discharge care    Destination    [facility] .   Service: Skilled Nursing Contact information: [place] [phone]             No Known Allergies  Consultations:  Nephrology   Procedures/Studies: Dg Chest 1 View  Result Date: 01/15/2019 CLINICAL DATA:  Shortness of breath with pneumonia. EXAM: CHEST  1 VIEW COMPARISON:  January 11, 2019 FINDINGS: There are diffuse bilateral airspace opacities with progression since the prior study. There are small to moderate-sized bilateral pleural effusions. There is no pneumothorax. The heart size is stable from prior study. There is no acute osseous abnormality. There is no displaced rib fracture. IMPRESSION: Worsening multifocal airspace opacities. New small  to moderate-sized bilateral pleural effusions. Electronically Signed   By: Constance Holster M.D.   On: 01/15/2019 15:13   Ct Chest Wo Contrast  Result Date: 01/24/2019 CLINICAL DATA:  Short of breath. Patient presented to the emergency room with altered mental status. Patient is suspected of pneumonia, possible COVID-19 pneumonia. EXAM: CT CHEST WITHOUT CONTRAST TECHNIQUE: Multidetector CT imaging of the chest was performed following the standard protocol without IV contrast. COMPARISON:  None. FINDINGS: Cardiovascular: Mild enlargement of the heart. No pericardial effusion. Left coronary artery  calcifications. Mild atherosclerotic calcifications along the aortic arch. Mediastinum/Nodes: No neck base, axillary, mediastinal or hilar masses. No enlarged lymph nodes. Trachea is enlarged 2.7 cm anterior-posterior. Nasal/orogastric tube passes through the esophagus, tip in the distal stomach. Lungs/Pleura: Small bilateral pleural effusions. Lungs demonstrate a combination of consolidation and ground-glass airspace opacities, as well as interstitial type opacities, the latter most evident in the lower lungs. No lung mass or discrete nodule. No pneumothorax. Upper Abdomen: Several low-density liver lesions are noted consistent with cysts. Nonobstructing stone in the upper pole the right kidney. No acute finding. Musculoskeletal: No fracture or acute finding.  No bone lesion. IMPRESSION: 1. Lung opacities include consolidation, ground-glass opacities and interstitial opacities, associated with small effusions. There is also mild cardiomegaly. The combination of findings may all be due to congestive heart failure with pulmonary edema. Multifocal pneumonia or a combination of both should be considered in the proper clinical setting. 2. Mild aortic atherosclerosis. Left coronary artery calcifications. Aortic Atherosclerosis (ICD10-I70.0). Electronically Signed   By: Lajean Manes M.D.   On: 01/24/2019 13:57   Dg Chest Port 1 View  Result Date: 01/23/2019 CLINICAL DATA:  Shortness of breath.  COVID-19 positive EXAM: PORTABLE CHEST 1 VIEW COMPARISON:  Yesterday FINDINGS: Extensive bilateral airspace disease that has worsened. Feeding tube which at least reaches the stomach. Cardiomegaly. No visible effusion or pneumothorax. IMPRESSION: Worsening bilateral pneumonia. Electronically Signed   By: Monte Fantasia M.D.   On: 01/23/2019 09:38   Dg Chest Port 1 View  Result Date: 01/22/2019 CLINICAL DATA:  73 year old male with hypoxia. Positive COVID-19. EXAM: PORTABLE CHEST 1 VIEW COMPARISON:  Chest radiograph  dated 01/17/2019. FINDINGS: A feeding tube is noted with tip beyond the inferior margin of the image. Bilateral confluent airspace opacities with overall slight improvement since the prior radiograph. Probable small left pleural effusion. No pneumothorax. Stable cardiac silhouette with atherosclerotic calcification of the aorta. No acute osseous pathology. IMPRESSION: 1. Slight interval improvement in the bilateral confluent airspace opacities compared to the prior radiograph. 2. Probable small left pleural effusion. Electronically Signed   By: Anner Crete M.D.   On: 01/22/2019 09:03   Dg Chest Port 1 View  Result Date: 01/17/2019 CLINICAL DATA:  Shortness of breath EXAM: PORTABLE CHEST 1 VIEW COMPARISON:  01/16/2019 FINDINGS: No significant interval change in AP portable examination with diffuse interstitial and heterogeneous airspace opacity. Mild cardiomegaly. IMPRESSION: No significant interval change in AP portable examination with diffuse interstitial and heterogeneous airspace opacity. Findings remain consistent with multifocal infection and/or edema. Electronically Signed   By: Eddie Candle M.D.   On: 01/17/2019 12:41   Dg Chest Port 1 View  Result Date: 01/16/2019 CLINICAL DATA:  Bilateral pleural effusions. COVID positive. EXAM: PORTABLE CHEST 1 VIEW COMPARISON:  Radiograph same day 538, 01/15/2019 FINDINGS: Stable mediastinum and cardiac silhouette. There is diffuse airspace disease in the lower lobes not improved from comparison exams. Potential small bilateral effusions. No pneumothorax. No acute osseous abnormality. IMPRESSION:  No interval change in bilateral lower lobe airspace disease. Electronically Signed   By: Suzy Bouchard M.D.   On: 01/16/2019 08:22   Dg Chest Port 1 View  Result Date: 01/16/2019 CLINICAL DATA:  Shortness of breath. EXAM: PORTABLE CHEST 1 VIEW COMPARISON:  Chest x-ray 01/15/2019. FINDINGS: Heart size normal. Diffuse bilateral pulmonary infiltrates again  noted. Improved bibasilar atelectasis. Small left pleural effusion again noted. No right pleural effusion noted on today's exam. No pneumothorax. Degenerative change thoracic spine. IMPRESSION: Diffuse bilateral pulmonary infiltrates again noted. Similar findings noted on prior exam. Small left pleural effusion again noted. No right pleural effusion noted on today's exam. 2.  Improved atelectasis in the lung bases. Electronically Signed   By: Marcello Moores  Register   On: 01/16/2019 06:01   Dg Chest Port 1 View  Result Date: 01/11/2019 CLINICAL DATA:  Altered mental status EXAM: PORTABLE CHEST 1 VIEW COMPARISON:  Chest radiograph 12/31/2018 FINDINGS: Monitoring leads overlie the patient. Stable cardiac and mediastinal contours. Interval development of bilateral patchy consolidative opacities. No pleural effusion or pneumothorax. IMPRESSION: Bilateral patchy areas of consolidation may represent multifocal pneumonia or atypical/viral infectious process. Electronically Signed   By: Lovey Newcomer M.D.   On: 01/11/2019 08:49     Subjective: Patient seen and examined.  "I want to go home, I am eating well I do need to stay in the hospital" no other overnight events.  Afebrile.  On room air.  His friend will take him to dialysis sessions.   Discharge Exam: Vitals:   02/06/19 0435 02/06/19 0714  BP: 135/64 (!) 143/70  Pulse: 72 69  Resp: (!) 21 19  Temp: 98.5 F (36.9 C) 98.1 F (36.7 C)  SpO2: 99% 98%   Vitals:   02/05/19 2320 02/06/19 0031 02/06/19 0435 02/06/19 0714  BP: 125/67 137/70 135/64 (!) 143/70  Pulse: 65 63 72 69  Resp: (!) 23 (!) 21 (!) 21 19  Temp: 97.7 F (36.5 C) 98.2 F (36.8 C) 98.5 F (36.9 C) 98.1 F (36.7 C)  TempSrc: Oral Oral Oral Oral  SpO2: 100% 99% 99% 98%  Weight:   48.5 kg   Height:        General: Pt is alert, awake, not in acute distress, on room air.  Chronically sick looking.  Not in any distress.  Was eating breakfast. Cardiovascular: RRR, S1/S2 +, no rubs, no  gallops Respiratory: CTA bilaterally, no wheezing, no rhonchi Abdominal: Soft, NT, ND, bowel sounds + Extremities: no edema, no cyanosis Left upper extremity dialysis fistula present.    The results of significant diagnostics from this hospitalization (including imaging, microbiology, ancillary and laboratory) are listed below for reference.     Microbiology: Recent Results (from the past 240 hour(s))  SARS Coronavirus 2 by RT PCR (hospital order, performed in [facility] hospital lab) Nasopharyngeal Nasopharyngeal Swab     Status: Abnormal   Collection Time: 01/28/19  6:00 PM   Specimen: Nasopharyngeal Swab  Result Value Ref Range Status   SARS Coronavirus 2 POSITIVE (A) NEGATIVE Final    Comment: RESULT CALLED TO, READ BACK BY AND VERIFIED WITH: B RODDY RN 01/28/19 1903 JDW (NOTE) SARS-CoV-2 target nucleic acids are DETECTED SARS-CoV-2 RNA is generally detectable in upper respiratory specimens  during the acute phase of infection.  Positive results are indicative  of the presence of the identified virus, but do not rule out bacterial infection or co-infection with other pathogens not detected by the test.  Clinical correlation with patient history and  other diagnostic information is necessary to determine patient infection status.  The expected result is negative. Fact Sheet for Patients:   StrictlyIdeas.no  Fact Sheet for Healthcare Providers:   BankingDealers.co.za   This test is not yet approved or cleared by the [place] FDA and  has been authorized for detection and/or diagnosis of SARS-CoV-2 by FDA under an Emergency Use Authorization (EUA).  This EUA will remain in effect (meaning this test can be used ) for the duration of  the COVID-19 declaration under Section 564(b)(1) of the Act, 21 U.S.C. section 360-bbb-3(b)(1), unless the authorization is terminated or revoked sooner. Performed at [facility],  [place] [address], [place], [place] [address]      Labs: BNP (last 3 results) Recent Labs    01/01/19 0826  BNP [id number]*   Basic Metabolic Panel: Recent Labs  Lab 02/01/19 0609 02/02/19 0559 02/03/19 0115 02/04/19 0435 02/05/19 0513  NA 137 135 135 134* 135  K 3.8 3.9 4.5 4.4 4.5  CL 102 100 101 99 100  CO2 24 26 23 25 24   GLUCOSE 103* 143* 143* 113* 110*  BUN 32* 50* 63* 53* 61*  CREATININE 2.58* 3.69* 4.00* 3.47* 4.44*  CALCIUM 7.7* 7.6* 7.6* 7.9* 7.8*  MG  --  2.0  --   --   --   PHOS 3.7 3.8 4.7* 4.2 4.9*   Liver Function Tests: Recent Labs  Lab 02/01/19 0609 02/02/19 0559 02/03/19 0115 02/04/19 0435 02/05/19 0513  ALBUMIN 1.8* 1.7* 1.8* 1.8* 2.0*   No results for input(s): LIPASE, AMYLASE in the last 168 hours. No results for input(s): AMMONIA in the last 168 hours. CBC: Recent Labs  Lab 02/02/19 0559 02/04/19 0435 02/05/19 0513  WBC 7.2 5.9 4.6  HGB 7.4* 8.1* 8.7*  HCT 22.8* 25.0* 26.5*  MCV 91.6 92.9 92.3  PLT 228 214 191   Cardiac Enzymes: No results for input(s): CKTOTAL, CKMB, CKMBINDEX, TROPONINI in the last 168 hours. BNP: Invalid input(s): POCBNP CBG: Recent Labs  Lab 02/04/19 2220 02/05/19 0801 02/05/19 1158 02/05/19 2050 02/06/19 0719  GLUCAP 104* 107* 175* 131* 97   D-Dimer No results for input(s): DDIMER in the last 72 hours. Hgb A1c No results for input(s): HGBA1C in the last 72 hours. Lipid Profile No results for input(s): CHOL, HDL, LDLCALC, TRIG, CHOLHDL, LDLDIRECT in the last 72 hours. Thyroid function studies No results for input(s): TSH, T4TOTAL, T3FREE, THYROIDAB in the last 72 hours.  Invalid input(s): FREET3 Anemia work up No results for input(s): VITAMINB12, FOLATE, FERRITIN, TIBC, IRON, RETICCTPCT in the last 72 hours. Urinalysis    Component Value Date/Time   COLORURINE YELLOW 01/23/2019 1118   APPEARANCEUR HAZY (A) 01/23/2019 1118   LABSPEC 1.011 01/23/2019 1118   PHURINE 7.0 01/23/2019 1118   GLUCOSEU  NEGATIVE 01/23/2019 1118   HGBUR SMALL (A) 01/23/2019 1118   BILIRUBINUR NEGATIVE 01/23/2019 1118   KETONESUR NEGATIVE 01/23/2019 1118   PROTEINUR 100 (A) 01/23/2019 1118   UROBILINOGEN 1.0 12/20/2014 1252   NITRITE NEGATIVE 01/23/2019 1118   LEUKOCYTESUR NEGATIVE 01/23/2019 1118   Sepsis Labs Invalid input(s): PROCALCITONIN,  WBC,  LACTICIDVEN Microbiology Recent Results (from the past 240 hour(s))  SARS Coronavirus 2 by RT PCR (hospital order, performed in [place] hospital lab) Nasopharyngeal Nasopharyngeal Swab     Status: Abnormal   Collection Time: 01/28/19  6:00 PM   Specimen: Nasopharyngeal Swab  Result Value Ref Range Status   SARS Coronavirus 2 POSITIVE (A) NEGATIVE Final  Comment: RESULT CALLED TO, READ BACK BY AND VERIFIED WITH: B RODDY RN 01/28/19 1903 JDW (NOTE) SARS-CoV-2 target nucleic acids are DETECTED SARS-CoV-2 RNA is generally detectable in upper respiratory specimens  during the acute phase of infection.  Positive results are indicative  of the presence of the identified virus, but do not rule out bacterial infection or co-infection with other pathogens not detected by the test.  Clinical correlation with patient history and  other diagnostic information is necessary to determine patient infection status.  The expected result is negative. Fact Sheet for Patients:   StrictlyIdeas.no  Fact Sheet for Healthcare Providers:   BankingDealers.co.za   This test is not yet approved or cleared by the [place] FDA and  has been authorized for detection and/or diagnosis of SARS-CoV-2 by FDA under an Emergency Use Authorization (EUA).  This EUA will remain in effect (meaning this test can be used ) for the duration of  the COVID-19 declaration under Section 564(b)(1) of the Act, 21 U.S.C. section 360-bbb-3(b)(1), unless the authorization is terminated or revoked sooner. Performed at [facility], [place] [address], [place], [place] [address]      Time coordinating discharge:  45 minutes  SIGNED:   Barb Merino, MD  [facility] 02/06/2019, 10:32 AM

## 2019-02-06 NOTE — TOC Transition Note (Addendum)
Transition of Care [facility]) - CM/SW Discharge Note   Patient Details  Name: Francisco Williamson MRN: [id number] Date of Birth: [birth year]  [facility] CM/SW Contact:  Maryclare Labrador, RN Phone Number: 02/06/2019, 11:14 AM   Clinical Narrative:   Pt to discharge home today.  Pt informed CM that his friend Gary Fleet will provide 24 hour supervision at discharge and that she will also provide transportation to and from HD until he can do it himself.  Pt informed CM that he already has a 3:1 in the home.  Pt would like to utilize his nephrologist as his PCP Dr Delia Chimes as he already sees her frequently.  Pt declined CM setting him up with a [facility] PCP .  Pt will travel home via private vehicle driven by his friend.  Pt denied all barriers and concerns with discharging home today.  Renal Coordinator will ensure pt is aware of the specifics regarding outpt HD.  Pt confirms he has a thermometer in the home.  NO other CM needs - CM signing off    Final next level of care: [place] Barriers to Discharge: Barriers Resolved   Patient Goals and CMS Choice Patient states their goals for this hospitalization and ongoing recovery are:: Pt states he is ready to get back home to his bed CMS Medicare.gov Compare Post Acute Care list provided to:: Patient Choice offered to / list presented to : Patient  Discharge Placement                       Discharge Plan and Services In-house Referral: Clinical Social Work   Post Acute Care Choice: Skilled Nursing Facility                    HH Arranged: PT, OT [facility] Agency: [place] ([facility]) Date HH Agency Contacted: 02/06/19 Time [place]: 1114 Representative spoke with at [place]: [place] ([place]) Interventions     Readmission Risk Interventions No flowsheet data found.

## 2019-02-09 ENCOUNTER — Other Ambulatory Visit: Payer: Self-pay

## 2019-02-09 NOTE — Patient Outreach (Signed)
Transition of care: Primary MD. Dr. Redmond [facility] Nephrology: [facility]  Placed call to patient and gained permission to speak with caregiver Maddie  Toney Rakes. ( long life care giver for 42 years)  Patient reports to me that he is feeling stronger. Reports that he got covid and pneumonia after going to [place]. Reports he is better now and is getting stronger. Reports that he walks 3 times per day to strengthen his legs. Reports home health PT is coming today.  Reports that he is eating and drinking well. Reports continued shortness of breath with activity.  Patient reports that he goes to dialysis on Tues, Thur, Saturday schedule. Caregiver drives him.  Caregiver reports urine is darker than normal. Reports patient is taking all his medications as prescribed.   Caregiver reports patient has sores inside his nose that she is putting vaseline on cotton balls and placing them in patients nose.   PLAN: Reviewed importance of follow up with MD. Patient states that he sees kidney doctor at dialysis.  Will plan weekly follow up with patient and caregiver. Reviewed with patient to avoid any sick exposures if possible.  This note sent to pcp and nephrologist.  Tomasa Rand, RN, BSN, CEN [place] Coordinator [phone]

## 2019-02-10 ENCOUNTER — Telehealth: Payer: Self-pay

## 2019-02-10 NOTE — Telephone Encounter (Signed)
Okay.  Set him up for of hospital follow-up

## 2019-02-10 NOTE — Telephone Encounter (Signed)
Frankie from [facility] called for verbal orders for PT and nurse care for 1 time weekly for 9 weeks she said pt. Just got out of the hospital had COVID, pneumonia, and renal failure, contact phone number to call back for verbal orders is [phone].

## 2019-02-10 NOTE — Telephone Encounter (Signed)
LVM for pt to call back to schedule an hospital follow up . [place]

## 2019-02-12 ENCOUNTER — Other Ambulatory Visit: Payer: Self-pay

## 2019-02-12 NOTE — Patient Outreach (Signed)
Emmi/transition of care:   Active patient of mine with a red emmi alert for question about discharge papers.   Placed call to patient at home and cell phone without success.  PLAN: will call back in 24 hours.  Tomasa Rand, RN, BSN, CEN [facility] [employer] [phone]

## 2019-02-13 ENCOUNTER — Other Ambulatory Visit: Payer: Self-pay

## 2019-02-13 ENCOUNTER — Encounter (HOSPITAL_COMMUNITY): Payer: Medicare Other

## 2019-02-13 NOTE — Telephone Encounter (Signed)
Please advise if verbal is ok . See AC note. Thanks kH

## 2019-02-13 NOTE — Patient Outreach (Signed)
Emmi outreach attempt:  Placed call to patient for 2nd try to reach about red emmi alert. No answer. Unable to leave a message.  PLAN: will attempt again in 2 business days.  Tomasa Rand, RN, BSN, CEN [facility] [employer] [phone]

## 2019-02-15 ENCOUNTER — Inpatient Hospital Stay (HOSPITAL_COMMUNITY): Payer: Medicare Other

## 2019-02-15 ENCOUNTER — Encounter (HOSPITAL_COMMUNITY): Payer: Self-pay | Admitting: *Deleted

## 2019-02-15 ENCOUNTER — Emergency Department (HOSPITAL_COMMUNITY): Payer: Medicare Other

## 2019-02-15 ENCOUNTER — Other Ambulatory Visit: Payer: Self-pay

## 2019-02-15 ENCOUNTER — Inpatient Hospital Stay (HOSPITAL_COMMUNITY)
Admission: EM | Admit: 2019-02-15 | Discharge: 2019-03-06 | DRG: 208 | Disposition: E | Payer: Medicare Other | Attending: Internal Medicine | Admitting: Internal Medicine

## 2019-02-15 DIAGNOSIS — R627 Adult failure to thrive: Secondary | ICD-10-CM | POA: Diagnosis present

## 2019-02-15 DIAGNOSIS — D500 Iron deficiency anemia secondary to blood loss (chronic): Secondary | ICD-10-CM | POA: Diagnosis present

## 2019-02-15 DIAGNOSIS — R6521 Severe sepsis with septic shock: Secondary | ICD-10-CM | POA: Diagnosis present

## 2019-02-15 DIAGNOSIS — E43 Unspecified severe protein-calorie malnutrition: Secondary | ICD-10-CM | POA: Diagnosis present

## 2019-02-15 DIAGNOSIS — B948 Sequelae of other specified infectious and parasitic diseases: Secondary | ICD-10-CM | POA: Diagnosis not present

## 2019-02-15 DIAGNOSIS — J189 Pneumonia, unspecified organism: Secondary | ICD-10-CM | POA: Diagnosis present

## 2019-02-15 DIAGNOSIS — Z7982 Long term (current) use of aspirin: Secondary | ICD-10-CM

## 2019-02-15 DIAGNOSIS — Y830 Surgical operation with transplant of whole organ as the cause of abnormal reaction of the patient, or of later complication, without mention of misadventure at the time of the procedure: Secondary | ICD-10-CM | POA: Diagnosis present

## 2019-02-15 DIAGNOSIS — Z66 Do not resuscitate: Secondary | ICD-10-CM

## 2019-02-15 DIAGNOSIS — Z792 Long term (current) use of antibiotics: Secondary | ICD-10-CM | POA: Diagnosis not present

## 2019-02-15 DIAGNOSIS — Z681 Body mass index (BMI) 19 or less, adult: Secondary | ICD-10-CM

## 2019-02-15 DIAGNOSIS — N186 End stage renal disease: Secondary | ICD-10-CM | POA: Diagnosis present

## 2019-02-15 DIAGNOSIS — A419 Sepsis, unspecified organism: Secondary | ICD-10-CM | POA: Diagnosis present

## 2019-02-15 DIAGNOSIS — R64 Cachexia: Secondary | ICD-10-CM | POA: Diagnosis present

## 2019-02-15 DIAGNOSIS — Z515 Encounter for palliative care: Secondary | ICD-10-CM | POA: Diagnosis not present

## 2019-02-15 DIAGNOSIS — Z940 Kidney transplant status: Secondary | ICD-10-CM

## 2019-02-15 DIAGNOSIS — G9341 Metabolic encephalopathy: Secondary | ICD-10-CM | POA: Diagnosis present

## 2019-02-15 DIAGNOSIS — L8990 Pressure ulcer of unspecified site, unspecified stage: Secondary | ICD-10-CM | POA: Insufficient documentation

## 2019-02-15 DIAGNOSIS — J9601 Acute respiratory failure with hypoxia: Secondary | ICD-10-CM | POA: Diagnosis not present

## 2019-02-15 DIAGNOSIS — Z7952 Long term (current) use of systemic steroids: Secondary | ICD-10-CM

## 2019-02-15 DIAGNOSIS — R54 Age-related physical debility: Secondary | ICD-10-CM | POA: Diagnosis present

## 2019-02-15 DIAGNOSIS — R571 Hypovolemic shock: Secondary | ICD-10-CM | POA: Diagnosis present

## 2019-02-15 DIAGNOSIS — I120 Hypertensive chronic kidney disease with stage 5 chronic kidney disease or end stage renal disease: Secondary | ICD-10-CM | POA: Diagnosis present

## 2019-02-15 DIAGNOSIS — T8619 Other complication of kidney transplant: Secondary | ICD-10-CM | POA: Diagnosis present

## 2019-02-15 DIAGNOSIS — E875 Hyperkalemia: Secondary | ICD-10-CM | POA: Diagnosis present

## 2019-02-15 DIAGNOSIS — Z4659 Encounter for fitting and adjustment of other gastrointestinal appliance and device: Secondary | ICD-10-CM

## 2019-02-15 DIAGNOSIS — Z992 Dependence on renal dialysis: Secondary | ICD-10-CM

## 2019-02-15 DIAGNOSIS — E162 Hypoglycemia, unspecified: Secondary | ICD-10-CM | POA: Diagnosis present

## 2019-02-15 DIAGNOSIS — R0609 Other forms of dyspnea: Secondary | ICD-10-CM | POA: Diagnosis not present

## 2019-02-15 DIAGNOSIS — J80 Acute respiratory distress syndrome: Principal | ICD-10-CM | POA: Diagnosis present

## 2019-02-15 DIAGNOSIS — J9600 Acute respiratory failure, unspecified whether with hypoxia or hypercapnia: Secondary | ICD-10-CM

## 2019-02-15 DIAGNOSIS — R0600 Dyspnea, unspecified: Secondary | ICD-10-CM

## 2019-02-15 LAB — CBC WITH DIFFERENTIAL/PLATELET
Abs Immature Granulocytes: 0.01 10*3/uL (ref 0.00–0.07)
Basophils Absolute: 0 10*3/uL (ref 0.0–0.1)
Basophils Relative: 0 %
Eosinophils Absolute: 0 10*3/uL (ref 0.0–0.5)
Eosinophils Relative: 1 %
HCT: 34.7 % — ABNORMAL LOW (ref 39.0–52.0)
Hemoglobin: 11.2 g/dL — ABNORMAL LOW (ref 13.0–17.0)
Immature Granulocytes: 0 %
Lymphocytes Relative: 39 %
Lymphs Abs: 1.2 10*3/uL (ref 0.7–4.0)
MCH: 29.6 pg (ref 26.0–34.0)
MCHC: 32.3 g/dL (ref 30.0–36.0)
MCV: 91.6 fL (ref 80.0–100.0)
Monocytes Absolute: 0.4 10*3/uL (ref 0.1–1.0)
Monocytes Relative: 11 %
Neutro Abs: 1.5 10*3/uL — ABNORMAL LOW (ref 1.7–7.7)
Neutrophils Relative %: 49 %
Platelets: 207 10*3/uL (ref 150–400)
RBC: 3.79 MIL/uL — ABNORMAL LOW (ref 4.22–5.81)
RDW: 15.5 % (ref 11.5–15.5)
WBC: 3.2 10*3/uL — ABNORMAL LOW (ref 4.0–10.5)
nRBC: 0 % (ref 0.0–0.2)

## 2019-02-15 LAB — URINALYSIS, ROUTINE W REFLEX MICROSCOPIC
Bilirubin Urine: NEGATIVE
Glucose, UA: NEGATIVE mg/dL
Ketones, ur: 5 mg/dL — AB
Nitrite: NEGATIVE
Protein, ur: 100 mg/dL — AB
Specific Gravity, Urine: 1.02 (ref 1.005–1.030)
WBC, UA: >50 WBC/hpf — ABNORMAL HIGH (ref 0–5)
pH: 5 (ref 5.0–8.0)

## 2019-02-15 LAB — RESPIRATORY PANEL BY PCR

## 2019-02-15 LAB — POCT I-STAT EG7
Acid-Base Excess: 4 mmol/L — ABNORMAL HIGH (ref 0.0–2.0)
Bicarbonate: 29 mmol/L — ABNORMAL HIGH (ref 20.0–28.0)
Calcium, Ion: 1.03 mmol/L — ABNORMAL LOW (ref 1.15–1.40)
HCT: 33 % — ABNORMAL LOW (ref 39.0–52.0)
Hemoglobin: 11.2 g/dL — ABNORMAL LOW (ref 13.0–17.0)
O2 Saturation: 99 %
Potassium: 5.7 mmol/L — ABNORMAL HIGH (ref 3.5–5.1)
Sodium: 140 mmol/L (ref 135–145)
TCO2: 30 mmol/L (ref 22–32)
pCO2, Ven: 46.6 mmHg (ref 44.0–60.0)
pH, Ven: 7.401 (ref 7.250–7.430)
pO2, Ven: 159 mmHg — ABNORMAL HIGH (ref 32.0–45.0)

## 2019-02-15 LAB — PROTIME-INR
INR: 1.4 — ABNORMAL HIGH (ref 0.8–1.2)
Prothrombin Time: 17.5 seconds — ABNORMAL HIGH (ref 11.4–15.2)

## 2019-02-15 LAB — COMPREHENSIVE METABOLIC PANEL
ALT: 14 U/L (ref 0–44)
AST: 23 U/L (ref 15–41)
Albumin: 1.7 g/dL — ABNORMAL LOW (ref 3.5–5.0)
Alkaline Phosphatase: 70 U/L (ref 38–126)
Anion gap: 12 (ref 5–15)
BUN: 34 mg/dL — ABNORMAL HIGH (ref 8–23)
CO2: 27 mmol/L (ref 22–32)
Calcium: 7.8 mg/dL — ABNORMAL LOW (ref 8.9–10.3)
Chloride: 102 mmol/L (ref 98–111)
Creatinine, Ser: 8.52 mg/dL — ABNORMAL HIGH (ref 0.61–1.24)
GFR calc Af Amer: 7 mL/min — ABNORMAL LOW (ref >60)
GFR calc non Af Amer: 6 mL/min — ABNORMAL LOW (ref >60)
Glucose, Bld: 58 mg/dL — ABNORMAL LOW (ref 70–99)
Potassium: 6.1 mmol/L — ABNORMAL HIGH (ref 3.5–5.1)
Sodium: 141 mmol/L (ref 135–145)
Total Bilirubin: 1.3 mg/dL — ABNORMAL HIGH (ref 0.3–1.2)
Total Protein: 6 g/dL — ABNORMAL LOW (ref 6.5–8.1)

## 2019-02-15 LAB — GLUCOSE, CAPILLARY
Glucose-Capillary: 67 mg/dL — ABNORMAL LOW (ref 70–99)
Glucose-Capillary: 95 mg/dL (ref 70–99)

## 2019-02-15 LAB — MAGNESIUM: Magnesium: 1.2 mg/dL — ABNORMAL LOW (ref 1.7–2.4)

## 2019-02-15 LAB — FIBRINOGEN: Fibrinogen: 461 mg/dL (ref 210–475)

## 2019-02-15 LAB — POC SARS CORONAVIRUS 2 AG -  ED: SARS Coronavirus 2 Ag: NEGATIVE

## 2019-02-15 LAB — PHOSPHORUS: Phosphorus: 1.9 mg/dL — ABNORMAL LOW (ref 2.5–4.6)

## 2019-02-15 LAB — TRIGLYCERIDES: Triglycerides: 146 mg/dL (ref <150)

## 2019-02-15 LAB — LACTATE DEHYDROGENASE: LDH: 197 U/L — ABNORMAL HIGH (ref 98–192)

## 2019-02-15 LAB — PROCALCITONIN: Procalcitonin: 13.89 ng/mL

## 2019-02-15 LAB — APTT: aPTT: 34 seconds (ref 24–36)

## 2019-02-15 LAB — NA AND K (SODIUM & POTASSIUM), RAND UR
Potassium Urine: 44 mmol/L
Sodium, Ur: 44 mmol/L

## 2019-02-15 LAB — D-DIMER, QUANTITATIVE: D-Dimer, Quant: 4.22 ug/mL-FEU — ABNORMAL HIGH (ref 0.00–0.50)

## 2019-02-15 LAB — LACTIC ACID, PLASMA
Lactic Acid, Venous: 1.3 mmol/L (ref 0.5–1.9)
Lactic Acid, Venous: 7.6 mmol/L (ref 0.5–1.9)

## 2019-02-15 LAB — CBG MONITORING, ED
Glucose-Capillary: 32 mg/dL — CL (ref 70–99)
Glucose-Capillary: 210 mg/dL — ABNORMAL HIGH (ref 70–99)

## 2019-02-15 LAB — POTASSIUM: Potassium: 3.6 mmol/L (ref 3.5–5.1)

## 2019-02-15 LAB — INFLUENZA PANEL BY PCR (TYPE A & B)
Influenza A By PCR: NEGATIVE
Influenza B By PCR: NEGATIVE

## 2019-02-15 LAB — C-REACTIVE PROTEIN: CRP: 24.2 mg/dL — ABNORMAL HIGH (ref <1.0)

## 2019-02-15 LAB — FERRITIN: Ferritin: 990 ng/mL — ABNORMAL HIGH (ref 24–336)

## 2019-02-15 MED ORDER — ACETAMINOPHEN 325 MG PO TABS: 650.0000 mg | ORAL_TABLET | ORAL | Status: DC | PRN

## 2019-02-15 MED ORDER — FENTANYL CITRATE (PF) 100 MCG/2ML IJ SOLN: 50.0000 ug | INTRAMUSCULAR | Status: DC | PRN

## 2019-02-15 MED ORDER — SODIUM ZIRCONIUM CYCLOSILICATE 10 G PO PACK
10.0000 g | PACK | Freq: Two times a day (BID) | ORAL | Status: DC
  Administered 2019-02-15: 10 g via ORAL
  Filled 2019-02-15 (×2): qty 1
  Filled 2019-02-15: qty 2
  Filled 2019-02-15 (×2): qty 1

## 2019-02-15 MED ORDER — LORAZEPAM 2 MG/ML PO CONC: 1.0000 mg | ORAL | Status: DC | PRN

## 2019-02-15 MED ORDER — PRO-STAT SUGAR FREE PO LIQD
30.0000 mL | Freq: Two times a day (BID) | ORAL | Status: DC
  Administered 2019-02-15: 30 mL
  Filled 2019-02-15: qty 30

## 2019-02-15 MED ORDER — ONDANSETRON HCL 4 MG/2ML IJ SOLN: 4.0000 mg | Freq: Four times a day (QID) | INTRAMUSCULAR | Status: DC | PRN

## 2019-02-15 MED ORDER — PANTOPRAZOLE SODIUM 40 MG IV SOLR
40.0000 mg | Freq: Every day | INTRAVENOUS | Status: DC
  Administered 2019-02-15: 40 mg via INTRAVENOUS
  Filled 2019-02-15: qty 40

## 2019-02-15 MED ORDER — SUCCINYLCHOLINE CHLORIDE 20 MG/ML IJ SOLN
INTRAMUSCULAR | Status: AC | PRN
  Administered 2019-02-15: 100 mg via INTRAVENOUS

## 2019-02-15 MED ORDER — INSULIN ASPART 100 UNIT/ML IV SOLN
5.0000 [IU] | Freq: Once | INTRAVENOUS | Status: AC
  Administered 2019-02-15: 5 [IU] via INTRAVENOUS

## 2019-02-15 MED ORDER — DEXTROSE 50 % IV SOLN
25.0000 mL | Freq: Once | INTRAVENOUS | Status: AC
  Administered 2019-02-15: 25 mL via INTRAVENOUS

## 2019-02-15 MED ORDER — IPRATROPIUM-ALBUTEROL 0.5-2.5 (3) MG/3ML IN SOLN
3.0000 mL | Freq: Four times a day (QID) | RESPIRATORY_TRACT | Status: DC
  Administered 2019-02-15: 3 mL via RESPIRATORY_TRACT
  Filled 2019-02-15 (×2): qty 3

## 2019-02-15 MED ORDER — SODIUM CHLORIDE 0.9 % IV BOLUS
1000.0000 mL | Freq: Once | INTRAVENOUS | Status: AC
  Administered 2019-02-15: 1000 mL via INTRAVENOUS

## 2019-02-15 MED ORDER — ORAL CARE MOUTH RINSE
15.0000 mL | OROMUCOSAL | Status: DC
  Administered 2019-02-15 – 2019-02-17 (×10): 15 mL via OROMUCOSAL

## 2019-02-15 MED ORDER — VITAL HIGH PROTEIN PO LIQD
1000.0000 mL | ORAL | Status: DC
  Administered 2019-02-15: 1000 mL

## 2019-02-15 MED ORDER — SODIUM CHLORIDE 0.9 % IV SOLN
1.0000 g | Freq: Once | INTRAVENOUS | Status: AC
  Administered 2019-02-15: 1 g via INTRAVENOUS
  Filled 2019-02-15: qty 10

## 2019-02-15 MED ORDER — LORAZEPAM 2 MG/ML IJ SOLN: 1.0000 mg | INTRAMUSCULAR | Status: DC | PRN

## 2019-02-15 MED ORDER — VANCOMYCIN HCL IN DEXTROSE 1-5 GM/200ML-% IV SOLN
1000.0000 mg | Freq: Once | INTRAVENOUS | Status: AC
  Administered 2019-02-15: 1000 mg via INTRAVENOUS
  Filled 2019-02-15: qty 200

## 2019-02-15 MED ORDER — FENTANYL CITRATE (PF) 100 MCG/2ML IJ SOLN
50.0000 ug | INTRAMUSCULAR | Status: DC | PRN
Start: 2019-02-15 — End: 2019-02-16
  Administered 2019-02-15: 50 ug via INTRAVENOUS
  Filled 2019-02-15: qty 2

## 2019-02-15 MED ORDER — DEXTROSE 50 % IV SOLN
50.0000 mL | Freq: Once | INTRAVENOUS | Status: AC
  Administered 2019-02-15: 50 mL via INTRAVENOUS
  Filled 2019-02-15: qty 50

## 2019-02-15 MED ORDER — SODIUM CHLORIDE 0.9 % IV SOLN
250.0000 mL | INTRAVENOUS | Status: DC
  Administered 2019-02-15: 250 mL via INTRAVENOUS
  Administered 2019-02-16: 10 mL via INTRAVENOUS
  Administered 2019-02-17: 250 mL via INTRAVENOUS

## 2019-02-15 MED ORDER — HYDROCORTISONE NA SUCCINATE PF 100 MG IJ SOLR
50.0000 mg | Freq: Four times a day (QID) | INTRAMUSCULAR | Status: DC
  Administered 2019-02-15 (×2): 50 mg via INTRAVENOUS
  Filled 2019-02-15 (×2): qty 2

## 2019-02-15 MED ORDER — SODIUM CHLORIDE 0.9 % IV SOLN
500.0000 mg | INTRAVENOUS | Status: DC
  Administered 2019-02-15: 500 mg via INTRAVENOUS
  Filled 2019-02-15 (×2): qty 500

## 2019-02-15 MED ORDER — MORPHINE BOLUS VIA INFUSION
1.0000 mg | INTRAVENOUS | Status: DC | PRN
  Administered 2019-02-16: 1 mg via INTRAVENOUS
  Filled 2019-02-15: qty 1

## 2019-02-15 MED ORDER — VANCOMYCIN HCL IN DEXTROSE 500-5 MG/100ML-% IV SOLN: 500.0000 mg | INTRAVENOUS | Status: DC

## 2019-02-15 MED ORDER — SODIUM CHLORIDE 0.9 % IV SOLN
1.0000 g | INTRAVENOUS | Status: DC
  Filled 2019-02-15: qty 1

## 2019-02-15 MED ORDER — INSULIN ASPART 100 UNIT/ML ~~LOC~~ SOLN: 0.0000 [IU] | SUBCUTANEOUS | Status: DC

## 2019-02-15 MED ORDER — SODIUM CHLORIDE 0.9 % IV SOLN
2.0000 g | Freq: Once | INTRAVENOUS | Status: AC
  Administered 2019-02-15: 2 g via INTRAVENOUS
  Filled 2019-02-15: qty 2

## 2019-02-15 MED ORDER — DEXTROSE 50 % IV SOLN
INTRAVENOUS | Status: AC
  Filled 2019-02-15: qty 50

## 2019-02-15 MED ORDER — ETOMIDATE 2 MG/ML IV SOLN
INTRAVENOUS | Status: AC | PRN
  Administered 2019-02-15: 20 mg via INTRAVENOUS

## 2019-02-15 MED ORDER — CHLORHEXIDINE GLUCONATE 0.12% ORAL RINSE (MEDLINE KIT)
15.0000 mL | Freq: Two times a day (BID) | OROMUCOSAL | Status: DC
  Administered 2019-02-15 – 2019-02-17 (×4): 15 mL via OROMUCOSAL

## 2019-02-15 MED ORDER — DEXTROSE 50 % IV SOLN
1.0000 | Freq: Once | INTRAVENOUS | Status: AC
  Administered 2019-02-15: 50 mL via INTRAVENOUS
  Filled 2019-02-15: qty 50

## 2019-02-15 MED ORDER — ACETAMINOPHEN 325 MG RE SUPP
325.0000 mg | Freq: Once | RECTAL | Status: AC
  Administered 2019-02-15: 325 mg via RECTAL
  Filled 2019-02-15: qty 1

## 2019-02-15 MED ORDER — NOREPINEPHRINE 4 MG/250ML-% IV SOLN
2.0000 ug/min | INTRAVENOUS | Status: DC
  Administered 2019-02-15: 2 ug/min via INTRAVENOUS
  Filled 2019-02-15: qty 250

## 2019-02-15 MED ORDER — ALBUMIN HUMAN 25 % IV SOLN
25.0000 g | Freq: Once | INTRAVENOUS | Status: AC
  Administered 2019-02-15: 25 g via INTRAVENOUS
  Filled 2019-02-15: qty 50

## 2019-02-15 MED ORDER — SODIUM CHLORIDE 0.9 % IV SOLN
1000.0000 mL | INTRAVENOUS | Status: DC
  Administered 2019-02-15 (×2): 1000 mL via INTRAVENOUS

## 2019-02-15 MED ORDER — ACETAMINOPHEN 650 MG RE SUPP
650.0000 mg | Freq: Once | RECTAL | Status: AC
  Administered 2019-02-15: 650 mg via RECTAL
  Filled 2019-02-15: qty 1

## 2019-02-15 MED ORDER — MORPHINE 100MG IN NS 100ML (1MG/ML) PREMIX INFUSION
1.0000 mg/h | INTRAVENOUS | Status: DC
  Administered 2019-02-15: 1 mg/h via INTRAVENOUS
  Administered 2019-02-16 – 2019-02-17 (×2): 5 mg/h via INTRAVENOUS
  Filled 2019-02-15 (×3): qty 100

## 2019-02-15 MED ORDER — HEPARIN SODIUM (PORCINE) 5000 UNIT/ML IJ SOLN
5000.0000 [IU] | Freq: Three times a day (TID) | INTRAMUSCULAR | Status: DC
  Administered 2019-02-15 (×2): 5000 [IU] via SUBCUTANEOUS
  Filled 2019-02-15 (×2): qty 1

## 2019-02-15 MED ORDER — LORAZEPAM 1 MG PO TABS: 1.0000 mg | ORAL_TABLET | ORAL | Status: DC | PRN

## 2019-02-15 NOTE — Progress Notes (Signed)
Pharmacy Antibiotic Note  Trygg Reichelderfer is a 73 y.o. male admitted on 03/05/2019 with pneumonia and sepsis.  Pharmacy has been consulted for Cefepime and Vancomycin dosing.  Height: 5\' 8"  (172.7 cm) Weight: 106 lb 14.8 oz (48.5 kg) IBW/kg (Calculated) : 68.4  Temp (24hrs), Avg:104.4 F (40.2 C), Min:104.4 F (40.2 C), Max:104.4 F (40.2 C)  Recent Labs  Lab 03/04/2019 1456 02/03/2019 1510  WBC 3.2*  --   CREATININE 8.52*  --   LATICACIDVEN  --  1.3    Estimated Creatinine Clearance: 5.5 mL/min (A) (by C-G formula based on SCr of 8.52 mg/dL (H)).    No Known Allergies  Antimicrobials this admission: 12/13 Cefepime >>  12/13 Vancomycin >>   Dose adjustments this admission:   Microbiology results: 12/13 BCx: Pending  Plan:  - Patient appears to receive HD on TTS unclear when last HD session was  - Will start Cefepime 2G IV x 1 dose followed by Cefepime 1g q24h - Vancomycin 1000mg  IV x 1 dose  - Followed by Vancomycin 500 mg IV QTTS (after HD) - Monitor patient's HD schedule and with BP may even switch to CRRT if indicated    Thank you for allowing pharmacy to be a part of this patient's care.  Duanne Limerick  PharmD. BCPS  02/21/2019 4:25 PM

## 2019-02-15 NOTE — H&P (Signed)
[name], MRN:  [id number], DOB:  [birth year], LOS: 0 ADMISSION DATE:  02/10/2019, CONSULTATION DATE:  12/13 REFERRING MD:  Dr. 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  History of present illness   73 y/o M with hx of renal transplant and COVID-19 infection in 12/31/18 with prolonged hospitalizations who presented to [facility] on 12/13 with altered mental status.   Admitted 10/28-11/3 to [facility] with acute hypoxemic respiratory failure in the setting of COVID PNA. He was treated at that time with steroids, remdesivir. He was seen in ER again on 11/8-12/4 for failure to thrive, dehydration, AMS and renal failure.  Treated again with steroids, remdesivir & convalescent plasma.  Required intermittent HD during that admit.  Chart review shows that as late as 02/09/19 he was going to HD (T/Th/S) and walking around / working with PT at home. He was supposed to stop taking cellcept at discharge but family reportedly still giving.  Returns to [facility] on 12/13 with reports of altered mental status for two days. The patient reportedly had not been out of bed for two days and has been altered. He missed HD on 12/10. On presentation, he was normotensive with temp to 104.4 (R).  The patient was documented as unresponsive on arrival to ER.  Of note, he was DNR in the past. Family indicated no CPR to the ER provider. Initial labs - Na 141, K 6.1, glucose 58, BUN 34, Sr Cr 8.52, albumin 1.7, AST 23 / ALT 14, LDH 197, ferritin 990, LA 1.3, PCT 13.89, WBC 3.2, Hgb 11.2 and platelets 207.  CXR notable for nodular infiltrates.   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 Jonnie Finner, on tacrolimus, bactrim, prednisone GIB  Chronic Blood Loss Anemia  COVID Infection - 12/31/2018 Hypernatremia Adult failure to Thrive    Severe Protein Calorie Malnutrition   [facility] Events   12/13 Admit with AMS   Consults:  PCCM   Procedures:  ETT 12/13 >>   Significant Diagnostic Tests:  CT Head 12/13 >>   Micro Data:  BCx2 12/13 >>  Tracheal aspirate 12/13 >> UA 12/13 >>   Antimicrobials:  Vanco 12/13 >>  Cefepime 12/13 >>   Interim history/subjective:  Admitting  Objective   Blood pressure (!) 107/53, pulse (!) 115, temperature (!) 104.4 F (40.2 C), temperature source Rectal, resp. rate 20, height _0  (1.727 m), weight 48.5 kg, SpO2 100 %.    Vent Mode: PRVC FiO2 (%):  [100 %] 100 % Set Rate:  [18 bmp] 18 bmp Vt Set:  [550 mL] 550 mL PEEP:  [5 cmH20] 5 cmH20 Plateau Pressure:  [26 cmH20] 26 cmH20   Intake/Output Summary (Last 24 hours) at 02/06/2019 1606 Last data filed at 02/11/2019 1542 Gross per 24 hour  Intake 1000 ml  Output --  Net 1000 ml   Filed Weights   02/19/2019 1454  Weight: 48.5 kg    Examination: General:  Cachetic elderly man in NAD n vent HEENT: MM dry Neuro: Withdraws x 4 ext CV: RRR, ext warm PULM:  Scattered rhonci, no accessory muscle use GI: soft, hypoactive BS Extremities: warm/dry, no edema, + severe muscle wasting  Skin: no rashes or lesions  Resolved Hospital Problem list      Assessment & Plan:   #  Acute metabolic encephalopathy - CT Head - Hold sedation - Correct hypoglycemia  # Acute hypoxemic respiratory failure- CXR c/w ARDS, probably some DAD from previous COVID, superimposed HCAP vs. Aspiration vs. 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, prednisone, hypoglycemic - Hold immunosuppressants unless directed otherwise by nephrology - Stress steroids given hypoglycemia, preserved lymphocyte fraction c/w adrenal crisis  # Muscular deconditioning # Severe protein calorie malnutrition present on admission # Failure to thrive # Frail elderly - Palliative consult - TF, watch Phos/K for refeeding  # Chronic blood loss anemia - PPI, transfusion threshold HgB 7  Best practice:  Diet: TF Pain/Anxiety/Delirium protocol (if indicated): Hold pending neuro exam VAP protocol (if indicated): Ordered DVT prophylaxis: Heparin GI prophylaxis: PPI Glucose control: SSI q1h checks with hypoglycemia Mobility: BR Code Status: limited, no CPR/shocks/ACLS meds, pressors and mechanical ventilation okay (per ER discussion with family) Family Communication: have called, no answer Disposition: ICU   Labs   CBC: Recent Labs  Lab 03/04/2019 1456 02/04/2019 1530  WBC 3.2*  --   NEUTROABS 1.5*  --   HGB 11.2* 11.2*  HCT 34.7* 33.0*  MCV 91.6  --   PLT 207  --     Basic Metabolic Panel: Recent Labs  Lab 02/14/2019 1530  NA 140  K 5.7*   GFR: Estimated Creatinine Clearance: 10.5 mL/min (A) (by C-G formula based on SCr of 4.44 mg/dL (H)). Recent Labs  Lab 03/05/2019 1456 02/19/2019 1510  PROCALCITON 13.89  --   WBC 3.2*  --   LATICACIDVEN  --  1.3    Liver Function Tests: No results for input(s): AST, ALT, ALKPHOS, BILITOT, PROT, ALBUMIN in the last 168 hours. No results for input(s): LIPASE, AMYLASE in the last 168 hours. No results for input(s): AMMONIA in the last 168 hours.  ABG    Component Value Date/Time   PHART 7.553 (H) 01/23/2019 1353   PCO2ART 36.2 01/23/2019 1353   PO2ART 77.0 (L) 01/23/2019 1353   HCO3 29.0 (H) 02/10/2019 1530   TCO2 30 02/14/2019 1530   ACIDBASEDEF 3.0 (H)  01/16/2019 0535   O2SAT 99.0 02/14/2019 1530     Coagulation Profile: No results for input(s): INR, PROTIME in the last 168 hours.  Cardiac Enzymes: No results for input(s): CKTOTAL, CKMB, CKMBINDEX, TROPONINI in the last 168 hours.  HbA1C: Hgb A1c MFr Bld  Date/Time Value Ref Range Status  01/06/2019 12:45 AM 4.2 (L) 4.8 - 5.6 % Final    Comment:    (NOTE) Pre diabetes:          5.7%-6.4% Diabetes:              >6.4% Glycemic control for   <7.0% adults with diabetes   01/05/2019 05:48 AM 4.2 (L) 4.8 - 5.6 % Final    Comment:    (NOTE) Pre diabetes:          5.7%-6.4% Diabetes:              >6.4% Glycemic control for   <7.0% adults with  diabetes     CBG: No results for input(s): GLUCAP in the last 168 hours.  Review of Systems:   Cannot assess, intubated/sedated  Past Medical History  He,  has a past medical history of Hypertension and Renal disorder.   Surgical History    Past Surgical History:  Procedure Laterality Date  . NEPHRECTOMY TRANSPLANTED ORGAN       Social History   reports that he has never smoked. He has never used smokeless tobacco. He reports that he does not drink alcohol or use drugs.   Family History   His family history is not on file.   Allergies No Known Allergies   Home Medications  Prior to Admission medications   Medication Sig Start Date End Date Taking? Authorizing Provider  acetaminophen (TYLENOL) 325 MG tablet Take 2 tablets (650 mg total) by mouth every 6 (six) hours as needed for mild pain or headache (fever >/= 101). 01/06/19   [name], MD  amLODipine (NORVASC) 10 MG tablet Take 10 mg by mouth daily. 06/17/14   [provider]  aspirin EC 81 MG tablet Take 81 mg by mouth daily.    [provider]  calcitRIOL (ROCALTROL) 0.5 MCG capsule Take 0.5 mcg by mouth daily. 06/17/14   [provider]  cloNIDine (CATAPRES) 0.2 MG tablet Take 1 tablet (0.2 mg total) by mouth 2 (two) times daily. 01/06/19    [name], MD  ferrous sulfate 325 (65 FE) MG EC tablet Take 325 mg by mouth 2 (two) times daily.    [provider]  Ipratropium-Albuterol (COMBIVENT) 20-100 MCG/ACT AERS respimat Inhale 1 puff into the lungs every 6 (six) hours. 01/06/19   [name], MD  labetalol (NORMODYNE) 300 MG tablet Take 1 tablet (300 mg total) by mouth 3 (three) times daily. Patient taking differently: Take 300 mg by mouth 2 (two) times daily.  01/06/19   [name], MD  magnesium oxide (MAG-OX) 400 MG tablet Take 400 mg by mouth 2 (two) times daily.    [provider]  pantoprazole (PROTONIX) 40 MG tablet Take 40 mg by mouth daily.    [provider]  PREDNISONE PO Take 5 mg by mouth 2 (two) times daily.     [provider]  sildenafil (VIAGRA) 100 MG tablet Take 100 mg by mouth as needed for erectile dysfunction.    [provider]  sodium bicarbonate 650 MG tablet Take 650 mg by mouth 3 (three) times daily.    [provider]  sulfamethoxazole-trimethoprim (BACTRIM,SEPTRA) 400-80 MG per tablet Take 1 tablet by mouth every Monday, Wednesday, and Friday. Take one tablet by mouth on Monday, Wednesday and fridays. 11/21/18   [provider]  tacrolimus (PROGRAF) 1 MG capsule Take 4 capsules (4 mg total) by mouth 2 (two) times daily. 02/06/19   Barb Merino, MD  vitamin C (VITAMIN C) 500 MG tablet Take 1 tablet (500 mg total) by mouth daily. 01/07/19   [name], MD  zinc sulfate 220 (50 Zn) MG capsule Take 1 capsule (220 mg total) by mouth daily. 01/07/19   [name], MD     35 minutes critical care time not including any separately billable procedures

## 2019-02-15 NOTE — Progress Notes (Signed)
Notified bedside nurse of need to administer antibiotics.  

## 2019-02-15 NOTE — Procedures (Signed)
Bronchoscopy  Indication: Abnormal CXR  Consent: Called and obtained from niece after discussing risks and benefits  Anesthesia: None  Procedure - Timeout performed - Bronchoscope advanced through ETT - Airways examined down to subsegmental level - Following airway examination, BAL in RLL with return of purulent material sent for culture  Findings - No endobronchial lesions - ETT can be advanced 2 cm  Specimen(s): BAL RLL  Complications: None immediate

## 2019-02-15 NOTE — Telephone Encounter (Signed)
It looks like he is scheduled for Monday

## 2019-02-15 NOTE — ED Triage Notes (Signed)
Patient  Presents to ed via [facility] states patient was discharged from hospital 12/4 after being admitted for 1 month with covid dx. Family states patient hasn't been out of bed in 2 days decreased loc.  Upon arrival to ed patient is unresp.

## 2019-02-15 NOTE — Progress Notes (Signed)
eLink Physician-Brief Progress Note Patient Name: Francisco Williamson DOB: [birth year] MRN: [id number]   Date of Service  02/06/2019  HPI/Events of Note  Patient's niece and brother now at bedside. They are in agreement with proceeding with terminal extubation. Patient appears comfortable at this time. Norepinephrine has been discontinued.  eICU Interventions  Ordered extubation. Discussed with bedside nurse. Comfort measure orders in place.     Intervention Category Intermediate Interventions: Other: Minor Interventions: Communication with other healthcare providers and/or family  [name] 02/16/2019, 11:56 PM

## 2019-02-15 NOTE — Progress Notes (Addendum)
Called Niece, NOK  We are in agreement. Give him trial of life support with limited pressors (whatever max peripheral is) If he takes a turn we allow him to pass in peace. Told her 50/50 chance he lives through night. All questions answered, told her we would reach out with any clinical changes.  Erskine Emery MD PCCM

## 2019-02-15 NOTE — Progress Notes (Signed)
[facility] Progress Note Patient Name: Francisco Williamson DOB: [birth year] MRN: [id number]   Date of Service  02/13/2019  HPI/Events of Note  Notified of lactate 7.6. Just received 1 liter bolus. On levophed at maximum allowable for peripheral line. MAP >70. Noted limited pressors as discussed with Dr Tamala Julian and niece.  eICU Interventions  Discussed with bedside that if BP decreases to inform me and plan to give albumin.     Intervention Category Major Interventions: Shock - evaluation and management  [name] 02/22/2019, 9:00 PM

## 2019-02-15 NOTE — Progress Notes (Signed)
eLink Physician-Brief Progress Note Patient Name: Francisco Williamson DOB: 16-Apr-1945 MRN: [id number]   Date of Service  02/18/2019  HPI/Events of Note  Notified of hypotension. I called niece, Lyanne Co, to discuss goals of care and clarify if a second pressor is something that is ok for [place] to add. She states that Mr Schons did not want to come to the hospital in the first place and she regrets calling EMS. She wants him to be as comfortable as possible. Informed her that we will stop pressors and will start morphine and ativan for comfort for which she agrees.  eICU Interventions  Comfort care order placed. Bedside RN to call family and verify our confirmation and to see if they plan to come in before initiating comfort measures only.     Intervention Category Minor Interventions: Communication with other healthcare providers and/or family  [name] 02/16/2019, 10:24 PM

## 2019-02-15 NOTE — ED Notes (Signed)
ED TO INPATIENT HANDOFF REPORT  ED Nurse Name and Phone #: Ryland [phone]  S Name/Age/Gender Francisco Williamson 73 y.o. male Room/Bed: 016C/016C  Code Status   Code Status: Full Code  Home/SNF/Other Pt from home, lives with family and has home health      Chief Complaint ARDS (adult respiratory distress syndrome) (HCC) [J80]  Triage Note Patient  Presents to ed via [facility] states patient was discharged from hospital 12/4 after being admitted for 1 month with covid dx. Family states patient hasn't been out of bed in 2 days decreased loc.  Upon arrival to ed patient is unresp.     Allergies No Known Allergies  Level of Care/Admitting Diagnosis ED Disposition    ED Disposition Condition Comment   Admit  Hospital Area: [facility] [[id number]]  Level of Care: ICU [6]  Covid Evaluation: N/A  Diagnosis: ARDS (adult respiratory distress syndrome) (HCC) [166489]  Admitting Physician: SMITH, DANIEL C [[id number]]  Attending Physician: SMITH, DANIEL C [[id number]]  Estimated length of stay: > 1 week  Certification:: I certify this patient will need inpatient services for at least 2 midnights       B Medical/Surgery History Past Medical History:  Diagnosis Date  . Hypertension   . Renal disorder    Past Surgical History:  Procedure Laterality Date  . NEPHRECTOMY TRANSPLANTED ORGAN       A IV Location/Drains/Wounds Patient Lines/Drains/Airways Status   Active Line/Drains/Airways    Name:   Placement date:   Placement time:   Site:   Days:   Peripheral IV 02/11/2019 Right Forearm   02/16/2019    1440    Forearm   less than 1   Fistula / Graft Left Upper arm Arteriovenous fistula   --    --    Upper arm      External Urinary Catheter   01/27/19    1653    --   19   Airway 7.5 mm   02/04/2019    1517     less than 1   Airway 7.5 mm   02/28/2019    1545     less than 1          Intake/Output Last 24 hours  Intake/Output Summary (Last 24 hours) at 02/24/2019  1710 Last data filed at 02/26/2019 1710 Gross per 24 hour  Intake 1100 ml  Output --  Net 1100 ml    Labs/Imaging Results for orders placed or performed during the hospital encounter of 02/05/2019 (from the past 48 hour(s))  CBC WITH DIFFERENTIAL     Status: Abnormal   Collection Time: 02/21/2019  2:56 PM  Result Value Ref Range   WBC 3.2 (L) 4.0 - 10.5 K/uL   RBC 3.79 (L) 4.22 - 5.81 MIL/uL   Hemoglobin 11.2 (L) 13.0 - 17.0 g/dL   HCT 34.7 (L) 39.0 - 52.0 %   MCV 91.6 80.0 - 100.0 fL   MCH 29.6 26.0 - 34.0 pg   MCHC 32.3 30.0 - 36.0 g/dL   RDW 15.5 11.5 - 15.5 %   Platelets 207 150 - 400 K/uL   nRBC 0.0 0.0 - 0.2 %   Neutrophils Relative % 49 %   Neutro Abs 1.5 (L) 1.7 - 7.7 K/uL   Lymphocytes Relative 39 %   Lymphs Abs 1.2 0.7 - 4.0 K/uL   Monocytes Relative 11 %   Monocytes Absolute 0.4 0.1 - 1.0 K/uL   Eosinophils Relative 1 %   Eosinophils   Absolute 0.0 0.0 - 0.5 K/uL   Basophils Relative 0 %   Basophils Absolute 0.0 0.0 - 0.1 K/uL   WBC Morphology DOHLE BODIES    Immature Granulocytes 0 %   Abs Immature Granulocytes 0.01 0.00 - 0.07 K/uL    Comment: Performed at [facility], [address] [place], [place] [address]  Comprehensive metabolic panel     Status: Abnormal   Collection Time: 02/09/2019  2:56 PM  Result Value Ref Range   Sodium 141 135 - 145 mmol/L   Potassium 6.1 (H) 3.5 - 5.1 mmol/L   Chloride 102 98 - 111 mmol/L   CO2 27 22 - 32 mmol/L   Glucose, Bld 58 (L) 70 - 99 mg/dL   BUN 34 (H) 8 - 23 mg/dL   Creatinine, Ser 8.52 (H) 0.61 - 1.24 mg/dL   Calcium 7.8 (L) 8.9 - 10.3 mg/dL   Total Protein 6.0 (L) 6.5 - 8.1 g/dL   Albumin 1.7 (L) 3.5 - 5.0 g/dL   AST 23 15 - 41 U/L   ALT 14 0 - 44 U/L   Alkaline Phosphatase 70 38 - 126 U/L   Total Bilirubin 1.3 (H) 0.3 - 1.2 mg/dL   GFR calc non Af Amer 6 (L) >60 mL/min   GFR calc Af Amer 7 (L) >60 mL/min   Anion gap 12 5 - 15    Comment: Performed at [facility], [address] [place],  [place] [address]  D-dimer, quantitative     Status: Abnormal   Collection Time: 03/04/2019  2:56 PM  Result Value Ref Range   D-Dimer, Quant 4.22 (H) 0.00 - 0.50 ug/mL-FEU    Comment: (NOTE) At the manufacturer cut-off of 0.50 ug/mL FEU, this assay has been documented to exclude PE with a sensitivity and negative predictive value of 97 to 99%.  At this time, this assay has not been approved by the FDA to exclude DVT/VTE. Results should be correlated with clinical presentation. Performed at [facility], [address] [place], [place] [address]   Procalcitonin     Status: None   Collection Time: 02/07/2019  2:56 PM  Result Value Ref Range   Procalcitonin 13.89 ng/mL    Comment:        Interpretation: PCT >= 10 ng/mL: Important systemic inflammatory response, almost exclusively due to severe bacterial sepsis or septic shock. (NOTE)       Sepsis PCT Algorithm           Lower Respiratory Tract                                      Infection PCT Algorithm    ----------------------------     ----------------------------         PCT < 0.25 ng/mL                PCT < 0.10 ng/mL         Strongly encourage             Strongly discourage   discontinuation of antibiotics    initiation of antibiotics    ----------------------------     -----------------------------       PCT 0.25 - 0.50 ng/mL            PCT 0.10 - 0.25 ng/mL                 OR       >80% decrease in PCT            Discourage initiation of                                            antibiotics      Encourage discontinuation           of antibiotics    ----------------------------     -----------------------------         PCT >= 0.50 ng/mL              PCT 0.26 - 0.50 ng/mL                AND       <80% decrease in PCT             Encourage initiation of                                             antibiotics       Encourage continuation           of antibiotics    ----------------------------      -----------------------------        PCT >= 0.50 ng/mL                  PCT > 0.50 ng/mL               AND         increase in PCT                  Strongly encourage                                      initiation of antibiotics    Strongly encourage escalation           of antibiotics                                     -----------------------------                                           PCT <= 0.25 ng/mL                                                 OR                                        > 80% decrease in PCT                                     Discontinue / Do not initiate  antibiotics Performed at [facility], [address] [place], [place] [address]   Lactate dehydrogenase     Status: Abnormal   Collection Time: 02/24/2019  2:56 PM  Result Value Ref Range   LDH 197 (H) 98 - 192 U/L    Comment: Performed at [facility], [address] [place], [place] [address]  Ferritin     Status: Abnormal   Collection Time: 02/14/2019  2:56 PM  Result Value Ref Range   Ferritin 990 (H) 24 - 336 ng/mL    Comment: Performed at [facility], [address] [place], [place] [address]  Fibrinogen     Status: None   Collection Time: 02/18/2019  2:56 PM  Result Value Ref Range   Fibrinogen 461 210 - 475 mg/dL    Comment: Performed at [facility], [address] [place], [place] [address]  C-reactive protein     Status: Abnormal   Collection Time: 03/03/2019  2:56 PM  Result Value Ref Range   CRP 24.2 (H) <1.0 mg/dL    Comment: Performed at [facility], [address] [place], [place] [address]  Triglycerides     Status: None   Collection Time: 02/06/2019  2:56 PM  Result Value Ref Range   Triglycerides 146 <150 mg/dL    Comment: Performed at [facility], [address] [place], [place] [address]  APTT     Status: None   Collection Time: 02/09/2019  2:56 PM  Result Value Ref Range   aPTT 34 24 - 36  seconds    Comment: Performed at [facility], [address] [place], [place] [address]  Protime-INR     Status: Abnormal   Collection Time: 02/13/2019  2:56 PM  Result Value Ref Range   Prothrombin Time 17.5 (H) 11.4 - 15.2 seconds   INR 1.4 (H) 0.8 - 1.2    Comment: (NOTE) INR goal varies based on device and disease states. Performed at [facility], [address] [place], [place] [address]   Lactic acid, plasma     Status: None   Collection Time: 02/22/2019  3:10 PM  Result Value Ref Range   Lactic Acid, Venous 1.3 0.5 - 1.9 mmol/L    Comment: Performed at [facility], [address] , [place] [address]  POCT I-Stat EG7     Status: Abnormal   Collection Time: 02/14/2019  3:30 PM  Result Value Ref Range   pH, Ven 7.401 7.250 - 7.430   pCO2, Ven 46.6 44.0 - 60.0 mmHg   pO2, Ven 159.0 (H) 32.0 - 45.0 mmHg   Bicarbonate 29.0 (H) 20.0 - 28.0 mmol/L   TCO2 30 22 - 32 mmol/L   O2 Saturation 99.0 %   Acid-Base Excess 4.0 (H) 0.0 - 2.0 mmol/L   Sodium 140 135 - 145 mmol/L   Potassium 5.7 (H) 3.5 - 5.1 mmol/L   Calcium, Ion 1.03 (L) 1.15 - 1.40 mmol/L   HCT 33.0 (L) 39.0 - 52.0 %   Hemoglobin 11.2 (L) 13.0 - 17.0 g/dL   Patient temperature HIDE    Sample type VENOUS   POC SARS Coronavirus 2 Ag-ED - Nasal Swab (BD Veritor Kit)     Status: None   Collection Time: 02/14/2019  3:39 PM  Result Value Ref Range   SARS Coronavirus 2 Ag NEGATIVE NEGATIVE    Comment: (NOTE) SARS-CoV-2 antigen NOT DETECTED.  Negative   results are presumptive.  Negative results do not preclude SARS-CoV-2 infection and should not be used as the sole basis for treatment or other patient management decisions, including infection  control decisions, particularly in the presence of clinical signs and  symptoms consistent with COVID-19, or in those who have been in contact with the virus.  Negative results must be combined with clinical observations, patient history, and  epidemiological information. The expected result is Negative. Fact Sheet for Patients: PodPark.tn Fact Sheet for Healthcare Providers: GiftContent.is This test is not yet approved or cleared by the [place] FDA and  has been authorized for detection and/or diagnosis of SARS-CoV-2 by FDA under an Emergency Use Authorization (EUA).  This EUA will remain in effect (meaning this test can be used) for the duration of  the COVID-19 de claration under Section 564(b)(1) of the Act, 21 U.S.C. section 360bbb-3(b)(1), unless the authorization is terminated or revoked sooner.   Urinalysis, Routine w reflex microscopic     Status: Abnormal   Collection Time: 02/26/2019  4:00 PM  Result Value Ref Range   Color, Urine AMBER (A) YELLOW    Comment: BIOCHEMICALS MAY BE AFFECTED BY COLOR   APPearance TURBID (A) CLEAR   Specific Gravity, Urine 1.020 1.005 - 1.030   pH 5.0 5.0 - 8.0   Glucose, UA NEGATIVE NEGATIVE mg/dL   Hgb urine dipstick SMALL (A) NEGATIVE   Bilirubin Urine NEGATIVE NEGATIVE   Ketones, ur 5 (A) NEGATIVE mg/dL   Protein, ur 100 (A) NEGATIVE mg/dL   Nitrite NEGATIVE NEGATIVE   Leukocytes,Ua MODERATE (A) NEGATIVE   RBC / HPF 21-50 0 - 5 RBC/hpf   WBC, UA >50 (H) 0 - 5 WBC/hpf   Bacteria, UA MANY (A) NONE SEEN   WBC Clumps PRESENT    Budding Yeast PRESENT     Comment: Performed at [facility], [address], [place], [place] [address]  CBG monitoring, ED     Status: Abnormal   Collection Time: 02/25/2019  4:58 PM  Result Value Ref Range   Glucose-Capillary 32 (LL) 70 - 99 mg/dL   Comment 1 Notify RN    DG Chest Port 1 View  Result Date: 02/16/2019 CLINICAL DATA:  Presents to ed via [facility] states patient was discharged from hospital 12/4 after being admitted for 1 month with covid dx. Family states patient hasn't been out of bed in 2 days decreased loc. Upon arrival to ed patient is unresp. COVID positive. EXAM:  PORTABLE CHEST 1 VIEW COMPARISON:  01/23/2019 FINDINGS: There are bilateral and fairly diffuse hazy airspace lung opacities, less extensive than noted on the prior study. No evidence of pulmonary edema. No pleural effusion or pneumothorax. Cardiac silhouette is normal in size. No mediastinal or hilar masses. Endotracheal tube has its tip 3.9 cm above the carina. Skeletal structures are grossly intact. IMPRESSION: 1. Bilateral airspace opacities, which have improved compared to the prior study. Findings consistent with multifocal infection or inflammation. 2. No evidence of pulmonary edema. 3. Well-positioned endotracheal tube. Electronically Signed   By: Lajean Manes M.D.   On: 03/01/2019 15:52    Pending Labs Unresulted Labs (From admission, onward)    Start     Ordered   02/16/19 0174  Basic metabolic panel  Daily,   R     02/25/2019 1612   02/16/19 0500  Magnesium  Daily,   R     02/14/2019 1612   02/16/19 0500  CBC  Daily,   R  02/24/2019 1612   02/11/2019 1627  Na and K (sodium & potassium), rand urine  ONCE - STAT,   STAT     02/26/2019 1627   02/21/2019 1624  Potassium  Now then every 4 hours,   STAT    Comments: Until normal twice.    03/04/2019 1627   03/04/2019 1623  Magnesium  (ICU Tube Feeding: PEPuP )  5A & 5P,   R (with STAT occurrences)     02/10/2019 1622   03/02/2019 1623  Phosphorus  (ICU Tube Feeding: PEPuP )  5A & 5P,   R (with STAT occurrences)     02/21/2019 1622   02/07/2019 1619  Influenza panel by PCR (type A & B)  (Influenza PCR Panel)  Once,   STAT     02/18/2019 1618   02/20/2019 1533  Urine culture  ONCE - STAT,   STAT     02/25/2019 1533   02/08/2019 1435  Lactic acid, plasma  Now then every 2 hours,   STAT     02/11/2019 1435   02/26/2019 1435  Blood Culture (routine x 2)  BLOOD CULTURE X 2,   STAT     02/07/2019 1435          Vitals/Pain Today's Vitals   02/09/2019 1545 02/25/2019 1600 03/04/2019 1615 02/22/2019 1630  BP: (!) 89/50 (!) 84/56 (!) 83/55 (!) 72/45  Pulse:    (!) 104  Resp:  (!) _0 Temp:      TempSrc:      SpO2:    100%  Weight:      Height:        Isolation Precautions Droplet precaution  Medications Medications  0.9 %  sodium chloride infusion (1,000 mLs Intravenous New Bag/Given 03/05/2019 1600)  acetaminophen (TYLENOL) suppository 325 mg (has no administration in time range)  fentaNYL (SUBLIMAZE) injection 50 mcg (50 mcg Intravenous Given 02/24/2019 1507)  fentaNYL (SUBLIMAZE) injection 50 mcg (has no administration in time range)  sodium chloride 0.9 % bolus 1,000 mL (has no administration in time range)  ceFEPIme (MAXIPIME) 2 g in sodium chloride 0.9 % 100 mL IVPB (0 g Intravenous Stopped 02/03/2019 1710)    Followed by  ceFEPIme (MAXIPIME) 1 g in sodium chloride 0.9 % 100 mL IVPB (has no administration in time range)  vancomycin (VANCOCIN) IVPB 1000 mg/200 mL premix (has no administration in time range)  heparin injection 5,000 Units (has no administration in time range)  ondansetron (ZOFRAN) injection 4 mg (has no administration in time range)  acetaminophen (TYLENOL) tablet 650 mg (has no administration in time range)  pantoprazole (PROTONIX) injection 40 mg (has no administration in time range)  ipratropium-albuterol (DUONEB) 0.5-2.5 (3) MG/3ML nebulizer solution 3 mL (has no administration in time range)  insulin aspart (novoLOG) injection 0-24 Units (has no administration in time range)  feeding supplement (VITAL HIGH PROTEIN) liquid 1,000 mL (has no administration in time range)  feeding supplement (PRO-STAT SUGAR FREE 64) liquid 30 mL (has no administration in time range)  vancomycin (VANCOCIN) IVPB 500 mg/100 ml premix (has no administration in time range)  sodium zirconium cyclosilicate (LOKELMA) packet 10 g (has no administration in time range)  insulin aspart (novoLOG) injection 5 Units (has no administration in time range)    And  dextrose 50 % solution 50 mL (50 mLs Intravenous Given 02/20/2019 1709)  calcium chloride 1 g in  sodium chloride 0.9 % 100 mL IVPB (has no administration in time range)  hydrocortisone sodium succinate (SOLU-CORTEF) 100 MG injection 50 mg (has no administration in time range)  azithromycin (ZITHROMAX) 500 mg in sodium chloride 0.9 % 250 mL IVPB (has no administration in time range)  acetaminophen (TYLENOL) suppository 650 mg (650 mg Rectal Given 02/19/2019 1558)  sodium chloride 0.9 % bolus 1,000 mL (0 mLs Intravenous Stopped 02/09/2019 1542)  etomidate (AMIDATE) injection (20 mg Intravenous Given 02/19/2019 1502)  succinylcholine (ANECTINE) injection (100 mg Intravenous Given 02/18/2019 1445)  dextrose 50 % solution 50 mL (50 mLs Intravenous Given 02/09/2019 1710)    Mobility non-ambulatory High fall risk      R Recommendations: See Admitting Provider Note  Report given to:   Additional Notes:

## 2019-02-15 NOTE — Procedures (Signed)
Extubation Procedure Note  Patient Details:   Name: Francisco Williamson DOB: Feb 08, 1946 MRN: [id number]   Airway Documentation:  Airway 7.5 mm (Active)  Secured at (cm) 25 cm 02/24/2019 2326  Measured From Lips 02/14/2019 2326  Secured Location Center 02/26/2019 2326  Secured By [employer] 02/14/2019 2326  Tube Holder Repositioned Yes 03/01/2019 1928  Cuff Pressure (cm H2O) 28 cm H2O 02/23/2019 1928  Site Condition Dry 02/03/2019 2326   Vent end date: (not recorded) Vent end time: (not recorded)   Evaluation  O2 sats: stable throughout Complications: No apparent complications Patient did tolerate procedure well. Bilateral Breath Sounds: Clear   No  pt terminally extubated to [name], [name] 02/06/2019, 11:58 PM

## 2019-02-15 NOTE — Consult Note (Signed)
Renal Service Consult Note [facility]  Francisco Williamson 02/28/2019 Francisco Williamson Requesting Physician: Dr D. Mayo  Reason for Consult: ESRD pt w/ shock, resp failure/ PNA HPI: The patient is a 73 y.o. year-old w/ hx of HTN, ESRD , failed renal Tx (from 2002), recent admit x 2 for COVID+ PNA and complications thereof. Was dc'd from here 9 days ago. Missed HD yest, per family was too weak to go. Brought to ED by EMS today, hasn't been OOB for 2 days with dec'd LOC.  Unresponsive on arrival to ED.  Temp 104deg, K 6.1, creat 8.5, Hb 11, wbc 3.2 and CXR showing multifocal infiltrates.  Pt was intubated in the ED.  BP's in ED are in the 60's to 70's.    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 Was dc'd home, responded well to Rx. Was readmitted on 11/08 for FTT, dehydration, AMS and worsening renal failure. Rx'd for HCAP then worsened and rec'd 2nd round of COVID medications including convalescent plasma. Tx'd from [facility] to [facility] for intermittent HD. Required NG feeding tube.  Seen by pall care team and multiple discussions were held about EOL. Ultimately pt wanted to try continuing w HD and was dc'd home w/ PT/OT support.     ROS n/a   Past Medical History  Past Medical History:  Diagnosis Date  . Hypertension   . Renal disorder    Past Surgical History  Past Surgical History:  Procedure Laterality Date  . NEPHRECTOMY TRANSPLANTED ORGAN     Family History No family history on file. Social History  reports that he has never smoked. He has never used smokeless tobacco. He reports that he does not drink alcohol or use drugs. Allergies No Known Allergies Home medications Prior to Admission medications   Medication Sig Start Date End Date Taking? Authorizing Provider  acetaminophen (TYLENOL) 325 MG tablet Take 2 tablets (650 mg total) by mouth every 6 (six) hours as needed for mild pain or headache (fever >/= 101).  01/06/19   [name], MD  amLODipine (NORVASC) 10 MG tablet Take 10 mg by mouth daily. 06/17/14   [provider]  aspirin EC 81 MG tablet Take 81 mg by mouth daily.    [provider]  calcitRIOL (ROCALTROL) 0.5 MCG capsule Take 0.5 mcg by mouth daily. 06/17/14   [provider]  cloNIDine (CATAPRES) 0.2 MG tablet Take 1 tablet (0.2 mg total) by mouth 2 (two) times daily. 01/06/19   [name], MD  ferrous sulfate 325 (65 FE) MG EC tablet Take 325 mg by mouth 2 (two) times daily.    [provider]  Ipratropium-Albuterol (COMBIVENT) 20-100 MCG/ACT AERS respimat Inhale 1 puff into the lungs every 6 (six) hours. 01/06/19   [name], MD  labetalol (NORMODYNE) 300 MG tablet Take 1 tablet (300 mg total) by mouth 3 (three) times daily. Patient taking differently: Take 300 mg by mouth 2 (two) times daily.  01/06/19   [name], MD  magnesium oxide (MAG-OX) 400 MG tablet Take 400 mg by mouth 2 (two) times daily.    [provider]  pantoprazole (PROTONIX) 40 MG tablet Take 40 mg by mouth daily.    [provider]  PREDNISONE PO Take 5 mg by mouth 2 (two) times daily.     [provider]  sodium bicarbonate 650 MG tablet Take 650 mg by  mouth 3 (three) times daily.    [provider]  sulfamethoxazole-trimethoprim (BACTRIM,SEPTRA) 400-80 MG per tablet Take 1 tablet by mouth every Monday, Wednesday, and Friday. Take one tablet by mouth on Monday, Wednesday and fridays. 11/21/18   [provider]  tacrolimus (PROGRAF) 1 MG capsule Take 4 capsules (4 mg total) by mouth 2 (two) times daily. 02/06/19   Barb Merino, MD  vitamin C (VITAMIN C) 500 MG tablet Take 1 tablet (500 mg total) by mouth daily. 01/07/19   [name], MD  zinc sulfate 220 (50 Zn) MG capsule Take 1 capsule (220 mg total) by mouth daily. 01/07/19   [name], MD   Liver Function Tests Recent Labs  Lab 02/11/2019 1456  AST 23  ALT 14   ALKPHOS 70  BILITOT 1.3*  PROT 6.0*  ALBUMIN 1.7*   No results for input(s): LIPASE, AMYLASE in the last 168 hours. CBC Recent Labs  Lab 02/11/2019 1456 02/05/2019 1530  WBC 3.2*  --   NEUTROABS 1.5*  --   HGB 11.2* 11.2*  HCT 34.7* 33.0*  MCV 91.6  --   PLT 207  --    Basic Metabolic Panel Recent Labs  Lab 02/24/2019 1456 02/18/2019 1530  NA 141 140  K 6.1* 5.7*  CL 102  --   CO2 27  --   GLUCOSE 58*  --   BUN 34*  --   CREATININE 8.52*  --   CALCIUM 7.8*  --    Iron/TIBC/Ferritin/ %Sat    Component Value Date/Time   IRON 69 01/22/2019 1631   TIBC NOT CALCULATED 01/22/2019 1631   FERRITIN 990 (H) 02/04/2019 1456   IRONPCTSAT NOT CALCULATED 01/22/2019 1631    Vitals:   02/14/2019 1545 02/16/2019 1600 02/25/2019 1615 02/07/2019 1630  BP: (!) 89/50 (!) 84/56 (!) 83/55 (!) 72/45  Pulse:    (!) 104  Resp: (!) 29 20 18 20   Temp:      TempSrc:      SpO2:    100%  Weight:      Height:        Exam Gen cachectic [name], on vent and unresponsive No rash, cyanosis or gangrene Sclera anicteric, throat w ETT No jvd or bruits Chest bilat scattered crackles RRR no MRG Abd soft ntnd no mass or ascites +bs GU normal male MS significant cachexia x 4 ext Ext no edema, no wounds or ulcer Neuro is obtunded    Home meds:  - tacrolimus 4 bid/ pred 5 bid/ bactrim 400-80 tiw  - amlodipine 10 / clonidine 0.2 bid/ labetalol 300 bid  - pantoprazole 40 qd/ sod bicarb tid  - combivent 20-100 qid  - aspirin 81    Outpt HD: G-[facility] Covid shift  TTS   4h  400/800   45.5kg   2/2.25 bath  Hep none   LUA AVF  - no ESA, no Fe, no vit D  - very small UF last 2 sessions, last HD 12/10  - last Hb 9.7    Assessment/ Plan: 1. Shock - w/ high fevers, infiltrates on CXR suspected PNA.  IV abx per CCM 2. ESRD - failed Tx (2002) recently started back on dialysis during month long admit for COVID infection in November.  Has LUA AVF.  Not candidate for CRRT due to comorbidities and severe debility.  Not a candidate for inpatient HD unless hemodynamics were to considerably improve.  Would have low threshold for transition to comfort care, patient's overall  decline and poor condition make likelihood for survival very low. 3. Hyperkalemia - per CCM, agree w/ measures 4. VDRF - intubated in ED 5. FTT      Kelly Splinter  MD 02/04/2019, 5:09 PM

## 2019-02-15 NOTE — Progress Notes (Signed)
#  20g IV started to R wrist x1 stick.  Flushes easily.  Sterile technique utilized.     Noe Gens, MSN, NP-[facility] 02/06/2019, 5:41 PM   Please see [email or web address] for pager details.

## 2019-02-15 NOTE — ED Provider Notes (Signed)
[facility] EMERGENCY DEPARTMENT Provider Note   CSN: [id number] Arrival date & time: 02/25/2019  1435     History Chief Complaint  Patient presents with  . Altered Mental Status  . Weakness    Francisco Williamson is a 73 y.o. male.  Pt presents to the ED today with sob.  The pt is unable to give any hx.  Pt has been not well for a few months.  The pt was admitted from 10/28-11/3 for covid.  The pt was admitted again on 11/8-12/4.  The pt's niece (closest relative) said he has been very weak.  The pt did not go to dialysis on Thursday the 10th.  The pt's niece has still been giving him his cellcept although it was d/c'd at d/c.  The niece said he does not want CPR.         Past Medical History:  Diagnosis Date  . Hypertension   . Renal disorder     Patient Active Problem List   Diagnosis Date Noted  . ARDS (adult respiratory distress syndrome) ([place]) 02/26/2019  . Encounter for hospice care discussion   . Adult failure to thrive   . Weakness generalized   . Protein-calorie malnutrition, severe 01/23/2019  . Goals of care, counseling/discussion   . Palliative care by specialist   . Acute hypernatremia 01/17/2019  . Bilateral pleural effusion 01/16/2019  . Sepsis secondary to UTI ([place]) 01/14/2019  . HCAP (healthcare-associated pneumonia) 01/14/2019  . Acute renal failure superimposed on chronic kidney disease ([place]) 01/11/2019  . Essential hypertension 01/11/2019  . History of renal transplant 01/11/2019  . Severe sepsis ([place]) 01/11/2019  . Acute respiratory failure with hypoxia ([place]) 01/05/2019  . Pneumonia due to COVID-19 virus 01/01/2019  . CAP (community acquired pneumonia) 12/31/2018  . CKD stage 5 secondary to hypertension ([place]) 12/31/2018  . Hyperkalemia 12/31/2018  . Anemia in chronic kidney disease 09/29/2015    Past Surgical History:  Procedure Laterality Date  . NEPHRECTOMY TRANSPLANTED ORGAN         No family history on  file.  Social History   Tobacco Use  . Smoking status: Never Smoker  . Smokeless tobacco: Never Used  Substance Use Topics  . Alcohol use: No  . Drug use: No    Home Medications Prior to Admission medications   Medication Sig Start Date End Date Taking? Authorizing Provider  acetaminophen (TYLENOL) 325 MG tablet Take 2 tablets (650 mg total) by mouth every 6 (six) hours as needed for mild pain or headache (fever >/= 101). 01/06/19   [name], MD  amLODipine (NORVASC) 10 MG tablet Take 10 mg by mouth daily. 06/17/14   [provider]  aspirin EC 81 MG tablet Take 81 mg by mouth daily.    [provider]  calcitRIOL (ROCALTROL) 0.5 MCG capsule Take 0.5 mcg by mouth daily. 06/17/14   [provider]  cloNIDine (CATAPRES) 0.2 MG tablet Take 1 tablet (0.2 mg total) by mouth 2 (two) times daily. 01/06/19   [name], MD  ferrous sulfate 325 (65 FE) MG EC tablet Take 325 mg by mouth 2 (two) times daily.    [provider]  Ipratropium-Albuterol (COMBIVENT) 20-100 MCG/ACT AERS respimat Inhale 1 puff into the lungs every 6 (six) hours. 01/06/19   [name], MD  labetalol (NORMODYNE) 300 MG tablet Take 1 tablet (300 mg total) by mouth 3 (three) times daily. Patient taking differently: Take 300 mg by mouth 2 (two)  times daily.  01/06/19   [name], MD  magnesium oxide (MAG-OX) 400 MG tablet Take 400 mg by mouth 2 (two) times daily.    [provider]  pantoprazole (PROTONIX) 40 MG tablet Take 40 mg by mouth daily.    [provider]  PREDNISONE PO Take 5 mg by mouth 2 (two) times daily.     [provider]  sildenafil (VIAGRA) 100 MG tablet Take 100 mg by mouth as needed for erectile dysfunction.    [provider]  sodium bicarbonate 650 MG tablet Take 650 mg by mouth 3 (three) times daily.    [provider]  sulfamethoxazole-trimethoprim (BACTRIM,SEPTRA) 400-80 MG per tablet Take 1 tablet by  mouth every Monday, Wednesday, and Friday. Take one tablet by mouth on Monday, Wednesday and fridays. 11/21/18   [provider]  tacrolimus (PROGRAF) 1 MG capsule Take 4 capsules (4 mg total) by mouth 2 (two) times daily. 02/06/19   Barb Merino, MD  vitamin C (VITAMIN C) 500 MG tablet Take 1 tablet (500 mg total) by mouth daily. 01/07/19   [name], MD  zinc sulfate 220 (50 Zn) MG capsule Take 1 capsule (220 mg total) by mouth daily. 01/07/19   [name], MD    Allergies    Patient has no known allergies.  Review of Systems   Review of Systems  Unable to perform ROS: Patient unresponsive    Physical Exam Updated Vital Signs BP (!) 107/53 (BP Location: Right Arm)   Pulse (!) 115   Temp (!) 104.4 F (40.2 C) (Rectal)   Resp 20   Ht 5\' 8"  (1.727 m)   Wt 48.5 kg   SpO2 100%   BMI 16.26 kg/m   Physical Exam Vitals and nursing note reviewed.  HENT:     Head: Normocephalic and atraumatic.     Right Ear: External ear normal.     Left Ear: External ear normal.     Nose: Nose normal.     Mouth/Throat:     Mouth: Mucous membranes are dry.  Eyes:     Extraocular Movements: Extraocular movements intact.     Conjunctiva/sclera: Conjunctivae normal.     Pupils: Pupils are equal, round, and reactive to light.  Cardiovascular:     Rate and Rhythm: Normal rate and regular rhythm.     Pulses: Normal pulses.     Heart sounds: Normal heart sounds.  Pulmonary:     Effort: Pulmonary effort is normal.     Breath sounds: Normal breath sounds.  Abdominal:     General: Abdomen is flat. Bowel sounds are normal.     Palpations: Abdomen is soft.  Musculoskeletal:        General: Normal range of motion.     Cervical back: Normal range of motion and neck supple.  Skin:    General: Skin is warm.     Capillary Refill: Capillary refill takes less than 2 seconds.  Neurological:     Mental Status: He is unresponsive.  Psychiatric:     Comments: Unable to assess     ED  Results / Procedures / Treatments   Labs (all labs ordered are listed, but only abnormal results are displayed) Labs Reviewed  CBC WITH DIFFERENTIAL/PLATELET - Abnormal; Notable for the following components:      Result Value   WBC 3.2 (*)    RBC 3.79 (*)    Hemoglobin 11.2 (*)    HCT 34.7 (*)  Neutro Abs 1.5 (*)    All other components within normal limits  COMPREHENSIVE METABOLIC PANEL - Abnormal; Notable for the following components:   Potassium 6.1 (*)    Glucose, Bld 58 (*)    BUN 34 (*)    Creatinine, Ser 8.52 (*)    Calcium 7.8 (*)    Total Protein 6.0 (*)    Albumin 1.7 (*)    Total Bilirubin 1.3 (*)    GFR calc non Af Amer 6 (*)    GFR calc Af Amer 7 (*)    All other components within normal limits  D-DIMER, QUANTITATIVE (NOT AT [facility]) - Abnormal; Notable for the following components:   D-Dimer, Quant 4.22 (*)    All other components within normal limits  LACTATE DEHYDROGENASE - Abnormal; Notable for the following components:   LDH 197 (*)    All other components within normal limits  FERRITIN - Abnormal; Notable for the following components:   Ferritin 990 (*)    All other components within normal limits  C-REACTIVE PROTEIN - Abnormal; Notable for the following components:   CRP 24.2 (*)    All other components within normal limits  PROTIME-INR - Abnormal; Notable for the following components:   Prothrombin Time 17.5 (*)    INR 1.4 (*)    All other components within normal limits  POCT I-STAT EG7 - Abnormal; Notable for the following components:   pO2, Ven 159.0 (*)    Bicarbonate 29.0 (*)    Acid-Base Excess 4.0 (*)    Potassium 5.7 (*)    Calcium, Ion 1.03 (*)    HCT 33.0 (*)    Hemoglobin 11.2 (*)    All other components within normal limits  CULTURE, BLOOD (ROUTINE X 2)  CULTURE, BLOOD (ROUTINE X 2)  URINE CULTURE  RESPIRATORY PANEL BY RT PCR (FLU A&B, COVID)  LACTIC ACID, PLASMA  PROCALCITONIN  FIBRINOGEN  TRIGLYCERIDES  APTT  LACTIC ACID,  PLASMA  URINALYSIS, ROUTINE W REFLEX MICROSCOPIC  POC SARS CORONAVIRUS 2 AG -  ED  I-STAT VENOUS BLOOD GAS, ED    EKG EKG Interpretation  Date/Time:  Sunday February 15 2019 14:49:27 EST Ventricular Rate:  116 PR Interval:    QRS Duration: 75 QT Interval:  317 QTC Calculation: 441 R Axis:   57 Text Interpretation: Sinus tachycardia Borderline ST elevation, anterior leads Since last tracing rate faster Confirmed by Isla Pence [phone]) on 03/01/2019 3:44:19 PM   Radiology DG Chest Port 1 View  Result Date: 02/04/2019 CLINICAL DATA:  Presents to ed via [facility] states patient was discharged from hospital 12/4 after being admitted for 1 month with covid dx. Family states patient hasn't been out of bed in 2 days decreased loc. Upon arrival to ed patient is unresp. COVID positive. EXAM: PORTABLE CHEST 1 VIEW COMPARISON:  01/23/2019 FINDINGS: There are bilateral and fairly diffuse hazy airspace lung opacities, less extensive than noted on the prior study. No evidence of pulmonary edema. No pleural effusion or pneumothorax. Cardiac silhouette is normal in size. No mediastinal or hilar masses. Endotracheal tube has its tip 3.9 cm above the carina. Skeletal structures are grossly intact. IMPRESSION: 1. Bilateral airspace opacities, which have improved compared to the prior study. Findings consistent with multifocal infection or inflammation. 2. No evidence of pulmonary edema. 3. Well-positioned endotracheal tube. Electronically Signed   By: Lajean Manes M.D.   On: 02/16/2019 15:52    Procedures Procedure Name: Intubation Date/Time: 02/13/2019 3:45 PM Performed by: Isla Pence,  MD Pre-anesthesia Checklist: Patient identified, Patient being monitored, Emergency Drugs available, Timeout performed and Suction available Oxygen Delivery Method: Non-rebreather mask Preoxygenation: Pre-oxygenation with 100% oxygen Induction Type: Rapid sequence Ventilation: Mask ventilation without  difficulty Laryngoscope Size: Glidescope and 3 Tube size: 7.5 mm Number of attempts: 1 Placement Confirmation: ETT inserted through vocal cords under direct vision,  CO2 detector and Breath sounds checked- equal and bilateral Secured at: 25 cm Tube secured with: ETT holder Dental Injury: Teeth and Oropharynx as per pre-operative assessment       (including critical care time)  Medications Ordered in ED Medications  0.9 %  sodium chloride infusion (1,000 mLs Intravenous New Bag/Given 02/20/2019 1600)  acetaminophen (TYLENOL) suppository 325 mg (has no administration in time range)  fentaNYL (SUBLIMAZE) injection 50 mcg (50 mcg Intravenous Given 02/14/2019 1507)  fentaNYL (SUBLIMAZE) injection 50 mcg (has no administration in time range)  sodium chloride 0.9 % bolus 1,000 mL (has no administration in time range)  ceFEPIme (MAXIPIME) 2 g in sodium chloride 0.9 % 100 mL IVPB (has no administration in time range)    Followed by  ceFEPIme (MAXIPIME) 1 g in sodium chloride 0.9 % 100 mL IVPB (has no administration in time range)  vancomycin (VANCOCIN) IVPB 1000 mg/200 mL premix (has no administration in time range)  heparin injection 5,000 Units (has no administration in time range)  ondansetron (ZOFRAN) injection 4 mg (has no administration in time range)  acetaminophen (TYLENOL) tablet 650 mg (has no administration in time range)  pantoprazole (PROTONIX) injection 40 mg (has no administration in time range)  acetaminophen (TYLENOL) suppository 650 mg (650 mg Rectal Given 02/18/2019 1558)  sodium chloride 0.9 % bolus 1,000 mL (0 mLs Intravenous Stopped 02/09/2019 1542)  etomidate (AMIDATE) injection (20 mg Intravenous Given 02/28/2019 1502)  succinylcholine (ANECTINE) injection (100 mg Intravenous Given 02/07/2019 1445)    ED Course  I have reviewed the triage vital signs and the nursing notes.  Pertinent labs & imaging results that were available during my care of the patient were reviewed by me  and considered in my medical decision making (see chart for details).    MDM Rules/Calculators/A&P     CHA2DS2/VAS Stroke Risk Points      N/A >= 2 Points: High Risk  1 - 1.99 Points: Medium Risk  0 Points: Low Risk    A final score could not be computed because of missing components.: Last  Change: N/A     This score determines the patient's risk of having a stroke if the  patient has atrial fibrillation.      This score is not applicable to this patient. Components are not  calculated.                   Pt is febrile, so a code sepsis was called.  He was given IVFs, vanc, and maxipime.    Pt was unresponsive, so I orally intubated him.  His niece said he would not want CPR.  Pt d/w Dr. Vaughan Browner (CCM) for admission.  CRITICAL CARE Performed by: Isla Pence   Total critical care time: 60 minutes  Critical care time was exclusive of separately billable procedures and treating other patients.  Critical care was necessary to treat or prevent imminent or life-threatening deterioration.  Critical care was time spent personally by me on the following activities: development of treatment plan with patient and/or surrogate as well as nursing, discussions with consultants, evaluation of patient's response to treatment, examination of patient,  obtaining history from patient or surrogate, ordering and performing treatments and interventions, ordering and review of laboratory studies, ordering and review of radiographic studies, pulse oximetry and re-evaluation of patient's condition.  Francisco Williamson was evaluated in Emergency Department on 03/05/2019 for the symptoms described in the history of present illness. He was evaluated in the context of the global COVID-19 pandemic, which necessitated consideration that the patient might be at risk for infection with the SARS-CoV-2 virus that causes COVID-19. Institutional protocols and algorithms that pertain to the evaluation of patients at risk  for COVID-19 are in a state of rapid change based on information released by regulatory bodies including the CDC and federal and state organizations. These policies and algorithms were followed during the patient's care in the ED.  Final Clinical Impression(s) / ED Diagnoses Final diagnoses:  Acute respiratory failure with hypoxia ([place])  Pneumonia of both lungs due to infectious organism, unspecified part of lung  Sepsis, due to unspecified organism, unspecified whether acute organ dysfunction present ([place])  ESRD on hemodialysis [facility])    Rx / DC Orders ED Discharge Orders    None       Isla Pence, MD 02/19/2019 1614

## 2019-02-16 ENCOUNTER — Inpatient Hospital Stay: Payer: Medicare Other | Admitting: Family Medicine

## 2019-02-16 ENCOUNTER — Inpatient Hospital Stay (HOSPITAL_COMMUNITY): Payer: Medicare Other

## 2019-02-16 DIAGNOSIS — R0600 Dyspnea, unspecified: Secondary | ICD-10-CM

## 2019-02-16 DIAGNOSIS — N186 End stage renal disease: Secondary | ICD-10-CM

## 2019-02-16 DIAGNOSIS — Z992 Dependence on renal dialysis: Secondary | ICD-10-CM

## 2019-02-16 DIAGNOSIS — R0609 Other forms of dyspnea: Secondary | ICD-10-CM

## 2019-02-16 DIAGNOSIS — J9601 Acute respiratory failure with hypoxia: Secondary | ICD-10-CM

## 2019-02-16 DIAGNOSIS — L8990 Pressure ulcer of unspecified site, unspecified stage: Secondary | ICD-10-CM | POA: Insufficient documentation

## 2019-02-16 DIAGNOSIS — Z515 Encounter for palliative care: Secondary | ICD-10-CM

## 2019-02-16 DIAGNOSIS — Z66 Do not resuscitate: Secondary | ICD-10-CM

## 2019-02-16 LAB — URINE CULTURE: Culture: 60000 — AB

## 2019-02-16 MED ORDER — IPRATROPIUM-ALBUTEROL 0.5-2.5 (3) MG/3ML IN SOLN: 3.0000 mL | RESPIRATORY_TRACT | Status: DC | PRN

## 2019-02-16 NOTE — Consult Note (Signed)
   [facility] CM Inpatient Consult   02/16/2019  Francisco Williamson 21-Oct-1945 [id number]  [facility] Status:  Active  Patient is newly active with [facility] for chronic disease management services.  Patient has been engaged by a [facility].   Chart reviewed for admissions and patient now noted for comfort care.  Will update [facility] RN Care Coordinator and follow up for progress.    Plan: Follow hospitalization as appropriate.   Of note, [facility] Care Management services does not replace or interfere with any services that are needed or arranged by inpatient [facility] care management team.  For additional questions or referrals please contact:  Natividad Brood, RN BSN [facility] Liaison  [phone] business mobile phone Toll free office [phone]  Fax number: [phone] [email or web address]

## 2019-02-16 NOTE — Telephone Encounter (Signed)
Yes but it looks like advance is requesting to

## 2019-02-16 NOTE — Progress Notes (Signed)
Pt to room 6N25 from 71M.  Pt appears comfortable on morphine drip at 5. Foley intact, positioned for comfort.

## 2019-02-16 NOTE — Progress Notes (Signed)
Girlfriend and niece in room with pt and he appears very comfortable.  Ordered comfort cart for family to have snacks.

## 2019-02-16 NOTE — Progress Notes (Signed)
NAMEPeace Williamson, MRN:  [id number], DOB:  05-Nov-1945, LOS: 1 ADMISSION DATE:  02/04/2019, CONSULTATION DATE:  12/13 REFERRING MD:  Francisco Williamson, CHIEF COMPLAINT:  AMS, weakness  Brief History    73 y/o M with hx of renal transplant and COVID-19 infection in 12/31/18 with prolonged hospitalizations who presented to [facility] on 12/13 with altered mental status.   Admitted 10/28-11/3 to [facility] with acute hypoxemic respiratory failure in the setting of COVID PNA. He was treated at that time with steroids, remdesivir. He was seen in ER again on 11/8-12/4 for failure to thrive, dehydration, AMS and renal failure.  Treated again with steroids, remdesivir & convalescent plasma.  Required intermittent HD during that admit.  Chart review shows that as late as 02/09/19 he was going to HD (T/Th/S) and walking around / working with PT at home. He was supposed to stop taking cellcept at discharge but family reportedly still giving.  Returns to [facility] on 12/13 with reports of altered mental status for two days. The patient reportedly had not been out of bed for two days and has been altered. He missed HD on 12/10. On presentation, he was normotensive with temp to 104.4 (R).  The patient was documented as unresponsive on arrival to ER.  Of note, he was DNR in the past. Family indicated no CPR to the ER provider. Initial labs - Na 141, K 6.1, glucose 58, BUN 34, Sr Cr 8.52, albumin 1.7, AST 23 / ALT 14, LDH 197, ferritin 990, LA 1.3, PCT 13.89, WBC 3.2, Hgb 11.2 and platelets 207.  CXR notable for nodular infiltrates.   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 Jonnie Williamson, on tacrolimus, bactrim, prednisone GIB  Chronic Blood Loss Anemia  COVID Infection - 12/31/2018 Hypernatremia Adult failure to Thrive  Severe Protein Calorie Malnutrition    [facility] Events   12/13 Admit with AMS   Consults:  PCCM   Procedures:  ETT 12/13 >> 12/13  Significant Diagnostic Tests:  CT Head 12/13 >>   Micro Data:  BCx2 12/13 >>  Tracheal aspirate 12/13 >> UA 12/13 >>   Antimicrobials:  Vanco 12/13 >>  Cefepime 12/13 >>   Interim history/subjective:    02/16/2019 - now in 6N25. Transferred out of 23m0. Per RN - he is terminal comfort care. Unresponsive on morphine gtt. Making some urine. No death rattle per RN. BP s> 100 sbp and HR 80 per RN. RN suspecte he could pass away in < 24h . Review of notes confirm terminal care plans. Extubated late last night  Objective   Blood pressure (!) 98/55, pulse 87, temperature (!) 96.4 F (35.8 C), resp. rate 13, height _0  (1.727 m), weight 48.5 kg, SpO2 99 %.    Vent Mode: PRVC FiO2 (%):  [60 %-100 %] 60 % Set Rate:  [18 bmp] 18 bmp Vt Set:  [540 mL-550 mL] 540 mL PEEP:  [5 cmH20] 5 cmH20 Plateau Pressure:  [23 cmH20-27 cmH20] 23 cmH20   Intake/Output Summary (Last 24 hours) at 02/16/2019 1038 Last data filed at 02/16/2019 0900 Gross per 24 hour  Intake 3897.76 ml  Output 125 ml  Net 3772.76 ml   Filed Weights   02/20/2019 1454  Weight: 48.5 kg    Examination: General:  cahcectic male with  mask on HEENT: muddy conjuctiva. Facial muscles look relaxed Neuro: RASS -5 on morphine gtt CV: not examined PULM:  No disterss GI: soft, no disternsion Extremities:dry, no edema, + severe muscle wasting  Skin: looks intact    LABS    PULMONARY Recent Labs  Lab 02/06/2019 1530  HCO3 29.0*  TCO2 30  O2SAT 99.0    CBC Recent Labs  Lab 02/04/2019 1456 02/23/2019 1530  HGB 11.2* 11.2*  HCT 34.7* 33.0*  WBC 3.2*  --   PLT 207  --     COAGULATION Recent Labs  Lab 02/03/2019 1456  INR 1.4*    CARDIAC  No results for input(s): TROPONINI in the last 168 hours. No results for input(s): PROBNP in the last 168 hours.   CHEMISTRY Recent Labs  Lab  02/22/2019 1456 02/06/2019 1530 02/14/2019 1952  NA 141 140  --   K 6.1* 5.7* 3.6  CL 102  --   --   CO2 27  --   --   GLUCOSE 58*  --   --   BUN 34*  --   --   CREATININE 8.52*  --   --   CALCIUM 7.8*  --   --   MG  --   --  1.2*  PHOS  --   --  1.9*   Estimated Creatinine Clearance: 5.5 mL/min (A) (by C-G formula based on SCr of 8.52 mg/dL (H)).   LIVER Recent Labs  Lab 02/14/2019 1456  AST 23  ALT 14  ALKPHOS 70  BILITOT 1.3*  PROT 6.0*  ALBUMIN 1.7*  INR 1.4*     INFECTIOUS Recent Labs  Lab 02/10/2019 1456 03/03/2019 1510 02/27/2019 1952  LATICACIDVEN  --  1.3 7.6*  PROCALCITON 13.89  --   --      ENDOCRINE CBG (last 3)  Recent Labs    03/01/2019 1734 02/19/2019 1935 02/18/2019 1959  GLUCAP 210* 67* 95         IMAGING x48h  - image(s) personally visualized  -   highlighted in bold DG Abd 1 View  Result Date: 02/14/2019 CLINICAL DATA:  Check gastric catheter placement EXAM: ABDOMEN - 1 VIEW COMPARISON:  None. FINDINGS: Gastric catheter is noted within the stomach. Scattered large and small bowel gas is noted. Patchy infiltrates are noted in lung bases bilaterally. IMPRESSION: Gastric catheter within the stomach. Electronically Signed   By: Francisco Williamson M.D.   On: 02/19/2019 20:39   CT HEAD WO CONTRAST  Result Date: 02/14/2019 CLINICAL DATA:  Presents to ed via [facility] states patient was discharged from hospital 12/4 after being admitted for 1 month with covid dx. Family states patient hasn't been out of bed in 2 days decreased loc. Upon arrival to ed patient is unresp. EXAM: CT HEAD WITHOUT CONTRAST TECHNIQUE: Contiguous axial images were obtained from the base of the skull through the vertex without intravenous contrast. COMPARISON:  None. FINDINGS: Brain: No evidence of acute infarction, hemorrhage, hydrocephalus, extra-axial collection or mass lesion/mass effect. There is age appropriate ventricular and sulcal enlargement. Patchy bilateral white matter  hypoattenuation is consistent with moderate chronic microvascular ischemic change. Vascular: No hyperdense vessel or unexpected calcification. Skull: Normal. Negative for fracture or focal lesion. Sinuses/Orbits: Chronic left phthisis bulbi. Right globe and orbit are unremarkable. Are changes from prior sinus surgery. Mucosal thickening in the inferior frontal sinuses and anterior middle ethmoid air cells. Mild right maxillary sinus mucosal thickening. Clear mastoid air cells. Other: None. IMPRESSION: 1. No acute intracranial  abnormalities. 2. Age-appropriate volume loss. Moderate chronic microvascular ischemic change. 3. Sinus mucosal thickening. Electronically Signed   By: Lajean Manes M.D.   On: 02/23/2019 18:12   DG Chest Port 1 View  Result Date: 02/20/2019 CLINICAL DATA:  Presents to ed via [facility] states patient was discharged from hospital 12/4 after being admitted for 1 month with covid dx. Family states patient hasn't been out of bed in 2 days decreased loc. Upon arrival to ed patient is unresp. COVID positive. EXAM: PORTABLE CHEST 1 VIEW COMPARISON:  01/23/2019 FINDINGS: There are bilateral and fairly diffuse hazy airspace lung opacities, less extensive than noted on the prior study. No evidence of pulmonary edema. No pleural effusion or pneumothorax. Cardiac silhouette is normal in size. No mediastinal or hilar masses. Endotracheal tube has its tip 3.9 cm above the carina. Skeletal structures are grossly intact. IMPRESSION: 1. Bilateral airspace opacities, which have improved compared to the prior study. Findings consistent with multifocal infection or inflammation. 2. No evidence of pulmonary edema. 3. Well-positioned endotracheal tube. Electronically Signed   By: Lajean Manes M.D.   On: 02/14/2019 15:52   Recent Results (from the past 240 hour(s))  Blood Culture (routine x 2)     Status: None (Preliminary result)   Collection Time: 02/27/2019  3:18 PM   Specimen: BLOOD  Result Value Ref Range  Status   Specimen Description BLOOD RIGHT ANTECUBITAL  Final   Special Requests   Final    BOTTLES DRAWN AEROBIC AND ANAEROBIC Blood Culture results may not be optimal due to an excessive volume of blood received in culture bottles   Culture   Final    NO GROWTH < 24 HOURS Performed at [facility] [address] [place], [place] [address]    Report Status PENDING  Incomplete  Respiratory Panel by PCR     Status: None   Collection Time: 03/01/2019  5:21 PM   Specimen: Bronchial Alveolar Lavage; Respiratory  Result Value Ref Range Status   Adenovirus NOT DETECTED NOT DETECTED Final   Coronavirus 229E NOT DETECTED NOT DETECTED Final    Comment: (NOTE) The Coronavirus on the Respiratory Panel, DOES NOT test for the novel  Coronavirus (2019 nCoV)    Coronavirus HKU1 NOT DETECTED NOT DETECTED Final   Coronavirus NL63 NOT DETECTED NOT DETECTED Final   Coronavirus OC43 NOT DETECTED NOT DETECTED Final   Metapneumovirus NOT DETECTED NOT DETECTED Final   Rhinovirus / Enterovirus NOT DETECTED NOT DETECTED Final   Influenza A NOT DETECTED NOT DETECTED Final   Influenza B NOT DETECTED NOT DETECTED Final   Parainfluenza Virus 1 NOT DETECTED NOT DETECTED Final   Parainfluenza Virus 2 NOT DETECTED NOT DETECTED Final   Parainfluenza Virus 3 NOT DETECTED NOT DETECTED Final   Parainfluenza Virus 4 NOT DETECTED NOT DETECTED Final   Respiratory Syncytial Virus NOT DETECTED NOT DETECTED Final   Bordetella pertussis NOT DETECTED NOT DETECTED Final   Chlamydophila pneumoniae NOT DETECTED NOT DETECTED Final   Mycoplasma pneumoniae NOT DETECTED NOT DETECTED Final    Comment: Performed at [facility], [place] [address], [place], [place] [address]  Culture, respiratory     Status: None (Preliminary result)   Collection Time: 02/05/2019  5:30 PM   Specimen: Bronchoalveolar Lavage; Respiratory  Result Value Ref Range Status   Specimen Description BRONCHIAL ALVEOLAR LAVAGE  Final   Special Requests  NONE  Final   Gram Stain   Final    ABUNDANT WBC PRESENT,BOTH  PMN AND MONONUCLEAR MODERATE GRAM POSITIVE COCCI MODERATE GRAM POSITIVE RODS FEW YEAST Performed at [facility], [place] [address], [place], [place] [address]    Culture PENDING  Incomplete   Report Status PENDING  Incomplete  Blood Culture (routine x 2)     Status: None (Preliminary result)   Collection Time: 02/07/2019  7:52 PM   Specimen: BLOOD RIGHT HAND  Result Value Ref Range Status   Specimen Description BLOOD RIGHT HAND  Final   Special Requests   Final    BOTTLES DRAWN AEROBIC ONLY Blood Culture results may not be optimal due to an inadequate volume of blood received in culture bottles   Culture   Final    NO GROWTH < 12 HOURS Performed at [facility], [place] [address] [place], [place] [address]    Report Status PENDING  Incomplete     Resolved Hospital Problem list      Assessment & Plan:  #COVID-19 12/31/2018 - no longer contagious   # Acute metabolic encephalopathy. CT head with chroni chcnages   # Acute hypoxemic respiratory failure- CXR c/w ARDS, probably some DAD from previous COVID, superimposed ? HCAP  /sp terminal extubation 03/03/2019.   # Previous renal allograft now failed # ESRD/Chronic renal failure, hyperkalemia- missed HD, some peaked T waves on EKG. Not a CRRT candidate    # Immunocompromised s on chronic prograf, prednisone,  # Muscular deconditioning # Severe protein calorie malnutrition present on admission # Failure to thrive # Frail elderly   # Chronic blood loss anemia     COmfort care Terminal Care Palliative care   - actively dying. Comfortable. Anticipate to live seveal hours to few days. Currently comfortable    Plan  - continue morphine and comfort measures  Best practice:  Comfort care     SIGNATURE    Dr. Brand Males, M.D., F.C.C.P,  Pulmonary and Critical Care Medicine Staff Physician, [place] Director -  Interstitial Lung Disease  Program  Pulmonary [place] at [place], [place], [address]  Pager: [phone], If no answer or between  15:00h - 7:00h: call [phone] Telephone: [phone]  10:44 AM 02/16/2019

## 2019-02-16 NOTE — Progress Notes (Signed)
Family at bedside (niece and brother) wish to proceed with full comfort care at this time. Morphine drip started, pressors turned off, patient extubated. Resting comfortably at this time. Per niece request attempted to inform Mattie, patients girlfriend, of current status. No answer at the number listed in the chart x2, voicemail box full.

## 2019-02-16 NOTE — Progress Notes (Signed)
Chaplain provided prayer and support to Virl's family at bedside.

## 2019-02-16 NOTE — Consult Note (Signed)
Consultation Note Date: 02/16/2019   Patient Name: Francisco Williamson  DOB: [birth year]  MRN: [id number]  Age / Sex: 73 y.o., male  PCP: Denita Lung, MD Referring Physician: Candee Furbish, MD  Reason for Consultation: Terminal Care  HPI/Patient Profile: 73 y.o. male  admitted on 02/12/2019 with hx of renal transplant and COVID-19 infection in 12/31/18 with prolonged hospitalizations who presented to [facility] on 12/13 with altered mental status.   Admitted 10/28-11/3 to [facility] with acute hypoxemic respiratory failure in the setting of COVID PNA. He was treated at that time with steroids, remdesivir. He was seen in ER again on 11/8-12/4 for failure to thrive, dehydration, AMS and renal failure.  Treated again with steroids, remdesivir & convalescent plasma.  Required intermittent HD during that admit.    Chart review shows that as late as 02/09/19 he was going to HD (T/Th/S) and walking around / working with PT at home.   Returns to [facility] on 12/13 with reports of altered mental status for two days. The patient reportedly had not been out of bed for two days and has been altered. He missed HD on 12/10.   On presentation, he was normotensive with temp to 104.4 (R).    Decision has been made to focus care on comfort and dignity, no further dialysis, no further life prolonging measures.    Patient is transitioning  at EOL    Focus is care is comfort and dignity as established by CCM  Clinical Assessment and Goals of Care:   This NP Wadie Lessen reviewed medical records, received report from team, assessed the patient and then meet at the patient's bedside along with his niece/ [place] and [place] to discuss  [place], EOL wishes.  Concept of  Palliative Care was discussed  The difference between an aggressive medical intervention path  and a palliative comfort care path for this patient at this time was had.   Values and goals of care important to patient and family were attempted to be elicited.  Family is comfortable with decision for full comfort path, 'He has suffered enough"    Natural trajectory and expectations at EOL were discussed.  Questions and concerns addressed.   Family encouraged to call with questions or concerns.    PMT will continue to support holistically.   SUMMARY OF RECOMMENDATIONS    Code Status/Advance Care Planning:  DNR   Symptom Management:   Dyspnea/Pain: Morphine gtt initiated previously by CCM  Agitation: Ativan 1 mg IV every 4 hours  Palliative Prophylaxis:   Frequent Pain Assessment and Oral Care  Additional Recommendations (Limitations, Scope, Preferences):  Full Comfort Care  Psycho-social/Spiritual:   Desire for further Chaplaincy support:yes- involved  Additional Recommendations: Grief/Bereavement Support  Prognosis:   Hours - Days  Discharge Planning: Anticipated Hospital Death      Primary Diagnoses: Present on Admission: . ARDS (adult respiratory distress syndrome) ([place])   I have reviewed the medical record, interviewed the patient and family, and examined the patient. The following aspects  are pertinent.  Past Medical History:  Diagnosis Date  . Hypertension   . Renal disorder    Social History   Socioeconomic History  . Marital status: Single    Spouse name: Not on file  . Number of children: Not on file  . Years of education: Not on file  . Highest education level: Not on file  Occupational History  . Not on file  Tobacco Use  . Smoking status: Never Smoker  . Smokeless tobacco: Never Used  Substance and Sexual Activity  . Alcohol use: No  . Drug use: No  . Sexual activity: Not on file  Other Topics Concern  . Not on file  Social History Narrative  . Not on file   Social Determinants of Health   Financial Resource Strain:   . Difficulty of Paying Living Expenses: Not on file  Food Insecurity:   .  Worried About Charity fundraiser in the Last Year: Not on file  . Ran Out of Food in the Last Year: Not on file  Transportation Needs:   . Lack of Transportation (Medical): Not on file  . Lack of Transportation (Non-Medical): Not on file  Physical Activity:   . Days of Exercise per Week: Not on file  . Minutes of Exercise per Session: Not on file  Stress:   . Feeling of Stress : Not on file  Social Connections:   . Frequency of Communication with Friends and Family: Not on file  . Frequency of Social Gatherings with Friends and Family: Not on file  . Attends Religious Services: Not on file  . Active Member of Clubs or Organizations: Not on file  . Attends Archivist Meetings: Not on file  . Marital Status: Not on file   No family history on file. Scheduled Meds: . chlorhexidine gluconate (MEDLINE KIT)  15 mL Mouth Rinse BID  . mouth rinse  15 mL Mouth Rinse 10 times per day  . pantoprazole (PROTONIX) IV  40 mg Intravenous QHS  . sodium zirconium cyclosilicate  10 g Oral BID   Continuous Infusions: . sodium chloride Stopped (02/23/2019 2149)  . morphine 5 mg/hr (02/16/19 1000)   PRN Meds:.acetaminophen, fentaNYL (SUBLIMAZE) injection, fentaNYL (SUBLIMAZE) injection, ipratropium-albuterol, LORazepam **OR** LORazepam **OR** LORazepam, morphine, ondansetron (ZOFRAN) IV Medications Prior to Admission:  Prior to Admission medications   Medication Sig Start Date End Date Taking? Authorizing Provider  amLODipine (NORVASC) 10 MG tablet Take 10 mg by mouth daily. 06/17/14  Yes [provider]  cloNIDine (CATAPRES) 0.2 MG tablet Take 1 tablet (0.2 mg total) by mouth 2 (two) times daily. 01/06/19  Yes [name], MD  ferrous sulfate 325 (65 FE) MG EC tablet Take 325 mg by mouth daily.    Yes [provider]  Ipratropium-Albuterol (COMBIVENT) 20-100 MCG/ACT AERS respimat Inhale 1 puff into the lungs every 6 (six) hours. 01/06/19  Yes [name], MD  labetalol  (NORMODYNE) 300 MG tablet Take 1 tablet (300 mg total) by mouth 3 (three) times daily. 01/06/19  Yes [name], MD  magnesium oxide (MAG-OX) 400 MG tablet Take 800 mg by mouth 2 (two) times daily.    Yes [provider]  mycophenolate (CELLCEPT) 500 MG tablet Take 1,000 mg by mouth 2 (two) times daily.   Yes [provider]  ondansetron (ZOFRAN) 4 MG tablet Take 4 mg by mouth every 6 (six) hours as needed for nausea or vomiting.   Yes [provider]  pantoprazole (PROTONIX) 40 MG tablet Take 40 mg by mouth daily.   Yes [provider]  predniSONE (DELTASONE) 5 MG tablet Take 5 mg by mouth daily.    Yes [provider]  sodium bicarbonate 650 MG tablet Take 1,300 mg by mouth 3 (three) times daily. 8am, 4pm, bedtime   Yes [provider]  sulfamethoxazole-trimethoprim (BACTRIM,SEPTRA) 400-80 MG per tablet Take 1 tablet by mouth every Monday, Wednesday, and Friday.  11/21/18  Yes [provider]  tacrolimus (PROGRAF) 1 MG capsule Take 4 capsules (4 mg total) by mouth 2 (two) times daily. Patient taking differently: Take 3 mg by mouth 2 (two) times daily.  02/06/19  Yes Barb Merino, MD  traZODone (DESYREL) 50 MG tablet Take 25 mg by mouth at bedtime as needed for sleep.   Yes [provider]  vitamin C (VITAMIN C) 500 MG tablet Take 1 tablet (500 mg total) by mouth daily. 01/07/19  Yes [name], MD  acetaminophen (TYLENOL) 325 MG tablet Take 2 tablets (650 mg total) by mouth every 6 (six) hours as needed for mild pain or headache (fever >/= 101). Patient not taking: Reported on 03/05/2019 01/06/19   [name], MD  zinc sulfate 220 (50 Zn) MG capsule Take 1 capsule (220 mg total) by mouth daily. Patient not taking: Reported on 02/05/2019 01/07/19   [name], MD   No Known Allergies Review of Systems  Unable to perform ROS: Acuity of condition    Physical Exam Constitutional:      Appearance: He is  cachectic.     Interventions: He is sedated.  Cardiovascular:     Rate and Rhythm: Normal rate.  Skin:    General: Skin is warm and dry.     Vital Signs: BP 100/64   Pulse 88   Temp (!) 96.8 F (36 C)   Resp 20   Ht 5' 8" (1.727 m)   Wt 48.5 kg   SpO2 99%   BMI 16.26 kg/m  Pain Scale: CPOT       SpO2: SpO2: 99 % O2 Device:SpO2: 99 % O2 Flow Rate: .   IO: Intake/output summary:   Intake/Output Summary (Last 24 hours) at 02/16/2019 1310 Last data filed at 02/16/2019 1100 Gross per 24 hour  Intake 3902.76 ml  Output 175 ml  Net 3727.76 ml    LBM: Last BM Date: 02/16/19 Baseline Weight: Weight: 48.5 kg Most recent weight: Weight: 48.5 kg     Palliative Assessment/Data:  10 % transitioning at  EOL    Discussed with bedside RN  Time In: 1100 Time Out: 1210 Time Total:  70 minutes Greater than 50%  of this time was spent counseling and coordinating care related to the above assessment and plan.  Signed by: Wadie Lessen, NP   Please contact Palliative Medicine Team phone at [phone] for questions and concerns.  For individual provider: See Shea Evans

## 2019-02-17 ENCOUNTER — Ambulatory Visit: Payer: Self-pay

## 2019-02-17 DIAGNOSIS — Z992 Dependence on renal dialysis: Secondary | ICD-10-CM

## 2019-02-17 DIAGNOSIS — N186 End stage renal disease: Secondary | ICD-10-CM

## 2019-02-18 ENCOUNTER — Other Ambulatory Visit: Payer: Self-pay

## 2019-02-18 LAB — CULTURE, RESPIRATORY W GRAM STAIN: Culture: NORMAL

## 2019-02-18 NOTE — Patient Outreach (Signed)
Case closure: Reviewed note and discharge from [place] record.  Patient is deceased. Case closed and care team updated.  Tomasa Rand, RN, BSN, CEN [facility] [employer] [phone]

## 2019-02-19 DIAGNOSIS — G9341 Metabolic encephalopathy: Secondary | ICD-10-CM | POA: Diagnosis present

## 2019-02-19 LAB — PNEUMOCYSTIS JIROVECI SMEAR BY DFA: Pneumocystis jiroveci Ag: NEGATIVE

## 2019-02-20 ENCOUNTER — Telehealth: Payer: Self-pay

## 2019-02-20 LAB — CULTURE, BLOOD (ROUTINE X 2)
Culture: NO GROWTH
Culture: NO GROWTH

## 2019-02-20 NOTE — Telephone Encounter (Signed)
Received dc from [employer] (original)   DC is for burial and a patient of Doctor Carlis Abbott.   DC will be taken to [facility] ICU for signature.

## 2019-02-25 ENCOUNTER — Telehealth: Payer: Self-pay

## 2019-02-25 NOTE — Telephone Encounter (Signed)
Recd signed DC from Dr. Carlis Abbott. Called [facility] to pick up.

## 2019-03-06 NOTE — Progress Notes (Signed)
Patient ID: Francisco Williamson, male   DOB: 04/15/1945, 74 y.o.   MRN: [id number]    This NP visited patient at the bedside as a follow up to  yesterday's [place], niece Francisco Williamson is at bedside.  Patient is unresponsive, continues on morphine drip and appears comfortable.  He is transitioning at end of life.  Prognosis is hours to days, expect hospital death  Created space and opportunity for family to share patient's life review.  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  He worked for many years for [employer] for the [place].  His daughter was able to face time today.  Continued conversation regarding current medical situation and the natural trajectory and expectations at end of life.  Family remain comfortable with decision to focus on comfort and dignity allowing for a natural death.  Palliative medicine will continue to support holistically  Emotional support offered  Questions and concerns addressed   Discussed with Isabel/LCSW  Total time spent on the unit was 25 minutes   Greater than 50% of the time was spent in counseling and coordination of care  Wadie Lessen NP  Palliative Medicine Team Team Phone # [phone] Pager [phone]

## 2019-03-06 NOTE — Plan of Care (Signed)
  Problem: Role Relationship: Goal: Family's ability to cope with current situation will improve Outcome: Progressing   Problem: Role Relationship: Goal: Ability to verbalize concerns, feelings, and thoughts to partner or family member will improve Outcome: Progressing   Problem: Pain Management: Goal: Satisfaction with pain management regimen will improve Outcome: Progressing

## 2019-03-06 NOTE — Progress Notes (Signed)
Nutrition Brief Note  Chart reviewed. Pt now transitioning to comfort care.  No further nutrition interventions warranted at this time.  Please re-consult as needed.   [name], RD, LDN, CDCES Registered Dietitian II Certified Diabetes Care and Education Specialist Pager: [phone] After hours Pager: [phone]  

## 2019-03-06 NOTE — Progress Notes (Signed)
Patient expired time of death 06-27-2118.Verified by Apolonio Schneiders, RN. Girlfriend at bedside. Niece was called by coworker Apolonio Schneiders, RN but no answer; voicemail left. MD paged on call through [employer]. Waiting for return phone call.

## 2019-03-06 NOTE — Discharge Summary (Signed)
Physician Discharge Summary  Patient ID: Francisco Williamson MRN: [id number] DOB/AGE: [birth year] 74 y.o.  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 Severe protein calorie malnutrition Frail elderly Failure to thrive Deconditioning Chronic blood loss anemia  Discharge Diagnoses:  Active Problems:   CKD stage 5 secondary to hypertension (HCC)   Hyperkalemia   Acute respiratory failure (HCC)   History of renal transplant   Protein-calorie malnutrition, severe   Adult failure to thrive   End of life care   ARDS (adult respiratory distress syndrome) (HCC)   Pressure injury of skin   ESRD (end stage renal disease) ([place])   DNR (do not resuscitate)   Dyspnea   Acute metabolic encephalopathy   Discharged Condition: deceased  Hospital Course: [name] was admitted and treated aggressively with intubation, mechanical ventilation, vasopressors, and invasive procedures.  Consistent with his wishes to limit aggressive care, he was palliatively extubated and remained comfortable with the assistance of medications.  His family and friends were able to remain at bedside, and he passed away on the evening of 01-Mar-2019.  Consults: nephrology and Palliative care  Significant Diagnostic Studies: microbiology: blood culture: negative, urine culture: positive for Yeast and bronchoscopy: Fungal elements, but cultures remain pending  Treatments: IV hydration, antibiotics: vancomycin, azithromycin and cefepime, analgesia: Morphine and respiratory therapy: Intubation with mechanical ventilation prior to palliative extubation  Discharge Exam: Blood pressure (!) 102/48, pulse (!) 110, temperature 98.7 F (37.1 C), temperature source Oral, resp. rate 20, height 5\' 8"  (1.727  m), weight 48.4 kg, SpO2 (!) 86 %.   Disposition: Funeral home of the family's wishes   Allergies as of March 01, 2019   No Known Allergies     Medication List    ASK your doctor about these medications   acetaminophen 325 MG tablet Commonly known as: TYLENOL Take 2 tablets (650 mg total) by mouth every 6 (six) hours as needed for mild pain or headache (fever >/= 101).   amLODipine 10 MG tablet Commonly known as: NORVASC Take 10 mg by mouth daily.   ascorbic acid 500 MG tablet Commonly known as: VITAMIN C Take 1 tablet (500 mg total) by mouth daily.   cloNIDine 0.2 MG tablet Commonly known as: CATAPRES Take 1 tablet (0.2 mg total) by mouth 2 (two) times daily.   ferrous sulfate 325 (65 FE) MG EC tablet Take 325 mg by mouth daily.   Ipratropium-Albuterol 20-100 MCG/ACT Aers respimat Commonly known as: COMBIVENT Inhale 1 puff into the lungs every 6 (six) hours.   labetalol 300 MG tablet Commonly known as: NORMODYNE Take 1 tablet (300 mg total) by mouth 3 (three) times daily.   magnesium oxide 400 MG tablet Commonly known as: MAG-OX Take 800 mg by mouth 2 (two) times daily.   mycophenolate 500 MG tablet Commonly known as: CELLCEPT Take 1,000 mg by mouth 2 (two) times daily.   ondansetron 4 MG tablet Commonly known as: ZOFRAN Take 4 mg by mouth every 6 (six) hours as needed for nausea or vomiting.   pantoprazole 40 MG tablet Commonly known as: PROTONIX Take 40 mg by mouth daily.   predniSONE 5 MG tablet Commonly known as: DELTASONE Take 5 mg by mouth daily.   sodium bicarbonate 650 MG tablet Take 1,300 mg by mouth 3 (three) times daily. 8am, 4pm, bedtime   sulfamethoxazole-trimethoprim 400-80 MG tablet Commonly known as:  BACTRIM Take 1 tablet by mouth every Monday, Wednesday, and Friday.   tacrolimus 1 MG capsule Commonly known as: PROGRAF Take 4 capsules (4 mg total) by mouth 2 (two) times daily.   traZODone 50 MG tablet Commonly known as:  DESYREL Take 25 mg by mouth at bedtime as needed for sleep.   zinc sulfate 220 (50 Zn) MG capsule Take 1 capsule (220 mg total) by mouth daily.        Signed: Julian Hy 02/19/2019, 7:16 AM

## 2019-03-06 NOTE — Social Work (Addendum)
1:30pm- CSW spoke with PMT provider Wadie Lessen, she has spoken with family and they are uncomfortable with pt moving. At this time pt family understands that visitation policy is same at [facility] as here. Per PMT pt will remain inpatient with comfort drip and care. CSW passed this on to PCCM.   12:59pm- CSW received call from PCCM regarding possibility for transfer to hospice home. Per PCCM pt daughter and more family coming from out of town and with visitation restrictions changing they are wondering if for visitation purposes pt could be transferred to hospice home to allow more visitors.   CSW aware PMT has spoken with family. Will discuss with PMT prior to contacting family for best continuity of care.   Message left for PMT on RN line.  CSW continuing to follow for support with disposition.  Westley Hummer, MSW, [place] Work [phone]

## 2019-03-06 NOTE — Progress Notes (Signed)
Francisco Williamson, MRN:  [id number], DOB:  [birth year], LOS: 2 ADMISSION DATE:  02/05/2019, CONSULTATION DATE:  12/13 REFERRING MD:  Dr. Gilford Williamson, CHIEF COMPLAINT:  AMS, weakness  Brief History    74 y/o M with hx of renal transplant and COVID-19 infection in 12/31/18 with prolonged hospitalizations who presented to [facility] on 12/13 with altered mental status.   Admitted 10/28-11/3 to [facility] with acute hypoxemic respiratory failure in the setting of COVID PNA. He was treated at that time with steroids, remdesivir. He was seen in ER again on 11/8-12/4 for failure to thrive, dehydration, AMS and renal failure.  Treated again with steroids, remdesivir & convalescent plasma.  Required intermittent HD during that admit.  Chart review shows that as late as 02/09/19 he was going to HD (T/Th/S) and walking around / working with PT at home. He was supposed to stop taking cellcept at discharge but family reportedly still giving.  Returns to [facility] on 12/13 with reports of altered mental status for two days. The patient reportedly had not been out of bed for two days and has been altered. He missed HD on 12/10. On presentation, he was normotensive with temp to 104.4 (R).  The patient was documented as unresponsive on arrival to ER.  Of note, he was DNR in the past. Family indicated no CPR to the ER provider. Initial labs - Na 141, K 6.1, glucose 58, BUN 34, Sr Cr 8.52, albumin 1.7, AST 23 / ALT 14, LDH 197, ferritin 990, LA 1.3, PCT 13.89, WBC 3.2, Hgb 11.2 and platelets 207.  CXR notable for nodular infiltrates.   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 Jonnie Williamson, on tacrolimus, bactrim, prednisone GIB  Chronic Blood Loss Anemia  COVID Infection - 12/31/2018 Hypernatremia Adult failure to Thrive  Severe Protein Calorie Malnutrition    Significant Hospital Events   12/13 Admit with AMS   Consults:  PCCM [facility] service in 6 N. 25  Procedures:  ETT 12/13 >> 12/13  Significant Diagnostic Tests:  CT Head 12/13 >>   Micro Data:  BCx2 12/13 >> NG  Tracheal aspirate 12/13 >> fungal elements, GNR, & GPCs UA 12/13 >>  yeast RVP negative  Antimicrobials:  Vanco 12/13 >>  12/13 Cefepime 12/13 >> 12/13  Interim history/subjective:   Francisco Williamson has remained comfortable.  His longtime friend and niece Francisco Williamson have been alternating remaining at bedside.  His niece reports that he is comfortable in the care he is receiving is consistent with his wishes, which she is grateful for.  His daughter and her family are coming from [place] on Thursday, and she is hopeful that they will be able to see him.  Objective   Blood pressure (!) 123/59, pulse 99, temperature 97.8 F (36.6 C), temperature source Oral, resp. rate 20, height _0  (1.727 m), weight 48.4 kg, SpO2 (!) 89 %.        Intake/Output Summary (Last 24 hours) at 03-07-2019 1126 Last data filed at 03/07/19 0930 Gross per 24 hour  Intake 0 ml  Output 100 ml  Net -100 ml   Filed Weights   02/14/2019 1454 02/16/19 1454  Weight: 48.5 kg 48.4 kg    Examination: General: Cachectic elderly man lying in bed comfortably, sleeping HEENT: [place]/AT Neuro: Not responsive to  verbal stimulation CV: Tachycardic, regular rhythm PULM: No tachypnea or accessory muscle use.  No appearance of air hunger. GI: Thin, soft, nontender, nondistended Extremities: No clubbing, cyanosis, edema.  Fistula LUE with thrill. Skin: No rashes or heel wounds.    LABS    PULMONARY Recent Labs  Lab 02/22/2019 1530  HCO3 29.0*  TCO2 30  O2SAT 99.0    CBC Recent Labs  Lab 02/23/2019 1456 02/03/2019 1530  HGB 11.2* 11.2*  HCT 34.7* 33.0*  WBC 3.2*  --   PLT 207  --     COAGULATION Recent Labs  Lab 03/04/2019 1456  INR 1.4*    CARDIAC  No results for input(s): TROPONINI in  the last 168 hours. No results for input(s): PROBNP in the last 168 hours.   CHEMISTRY Recent Labs  Lab 02/20/2019 1456 02/19/2019 1530 03/03/2019 1952  NA 141 140  --   K 6.1* 5.7* 3.6  CL 102  --   --   CO2 27  --   --   GLUCOSE 58*  --   --   BUN 34*  --   --   CREATININE 8.52*  --   --   CALCIUM 7.8*  --   --   MG  --   --  1.2*  PHOS  --   --  1.9*   Estimated Creatinine Clearance: 5.4 mL/min (A) (by C-G formula based on SCr of 8.52 mg/dL (H)).   LIVER Recent Labs  Lab 02/28/2019 1456  AST 23  ALT 14  ALKPHOS 70  BILITOT 1.3*  PROT 6.0*  ALBUMIN 1.7*  INR 1.4*     INFECTIOUS Recent Labs  Lab 03/05/2019 1456 02/11/2019 1510 02/06/2019 1952  LATICACIDVEN  --  1.3 7.6*  PROCALCITON 13.89  --   --      ENDOCRINE CBG (last 3)  Recent Labs    03/05/2019 1734 02/08/2019 1935 02/06/2019 1959  GLUCAP 210* 67* 95         IMAGING x48h  - image(s) personally visualized  -   highlighted in bold DG Abd 1 View  Result Date: 02/04/2019 CLINICAL DATA:  Check gastric catheter placement EXAM: ABDOMEN - 1 VIEW COMPARISON:  None. FINDINGS: Gastric catheter is noted within the stomach. Scattered large and small bowel gas is noted. Patchy infiltrates are noted in lung bases bilaterally. IMPRESSION: Gastric catheter within the stomach. Electronically Signed   By: Francisco Williamson M.D.   On: 02/19/2019 20:39   CT HEAD WO CONTRAST  Result Date: 02/04/2019 CLINICAL DATA:  Presents to ed via [facility] states patient was discharged from hospital 12/4 after being admitted for 1 month with covid dx. Family states patient hasn't been out of bed in 2 days decreased loc. Upon arrival to ed patient is unresp. EXAM: CT HEAD WITHOUT CONTRAST TECHNIQUE: Contiguous axial images were obtained from the base of the skull through the vertex without intravenous contrast. COMPARISON:  None. FINDINGS: Brain: No evidence of acute infarction, hemorrhage, hydrocephalus, extra-axial collection or mass lesion/mass  effect. There is age appropriate ventricular and sulcal enlargement. Patchy bilateral white matter hypoattenuation is consistent with moderate chronic microvascular ischemic change. Vascular: No hyperdense vessel or unexpected calcification. Skull: Normal. Negative for fracture or focal lesion. Sinuses/Orbits: Chronic left phthisis bulbi. Right globe and orbit are unremarkable. Are changes from prior sinus surgery. Mucosal thickening in the inferior frontal sinuses and anterior middle ethmoid air cells. Mild right maxillary sinus mucosal thickening. Clear mastoid air cells. Other: None. IMPRESSION: 1.  No acute intracranial abnormalities. 2. Age-appropriate volume loss. Moderate chronic microvascular ischemic change. 3. Sinus mucosal thickening. Electronically Signed   By: Lajean Manes M.D.   On: 03/04/2019 18:12   DG Chest Port 1 View  Result Date: 02/10/2019 CLINICAL DATA:  Presents to ed via [facility] states patient was discharged from hospital 12/4 after being admitted for 1 month with covid dx. Family states patient hasn't been out of bed in 2 days decreased loc. Upon arrival to ed patient is unresp. COVID positive. EXAM: PORTABLE CHEST 1 VIEW COMPARISON:  01/23/2019 FINDINGS: There are bilateral and fairly diffuse hazy airspace lung opacities, less extensive than noted on the prior study. No evidence of pulmonary edema. No pleural effusion or pneumothorax. Cardiac silhouette is normal in size. No mediastinal or hilar masses. Endotracheal tube has its tip 3.9 cm above the carina. Skeletal structures are grossly intact. IMPRESSION: 1. Bilateral airspace opacities, which have improved compared to the prior study. Findings consistent with multifocal infection or inflammation. 2. No evidence of pulmonary edema. 3. Well-positioned endotracheal tube. Electronically Signed   By: Lajean Manes M.D.   On: 02/11/2019 15:52   Recent Results (from the past 240 hour(s))  Blood Culture (routine x 2)     Status: None  (Preliminary result)   Collection Time: 02/18/2019  3:18 PM   Specimen: BLOOD  Result Value Ref Range Status   Specimen Description BLOOD RIGHT ANTECUBITAL  Final   Special Requests   Final    BOTTLES DRAWN AEROBIC AND ANAEROBIC Blood Culture results may not be optimal due to an excessive volume of blood received in culture bottles   Culture   Final    NO GROWTH < 24 HOURS Performed at [facility] [address], [place], [place] [address]    Report Status PENDING  Incomplete  Urine culture     Status: Abnormal   Collection Time: 02/21/2019  4:00 PM   Specimen: In/Out Cath Urine  Result Value Ref Range Status   Specimen Description IN/OUT CATH URINE  Final   Special Requests   Final    NONE Performed at [facility], [place] [address], [place], [place] [address]    Culture 60,000 COLONIES/mL YEAST (A)  Final   Report Status 02/16/2019 FINAL  Final  Respiratory Panel by PCR     Status: None   Collection Time: 02/20/2019  5:21 PM   Specimen: Bronchial Alveolar Lavage; Respiratory  Result Value Ref Range Status   Adenovirus NOT DETECTED NOT DETECTED Final   Coronavirus 229E NOT DETECTED NOT DETECTED Final    Comment: (NOTE) The Coronavirus on the Respiratory Panel, DOES NOT test for the novel  Coronavirus (2019 nCoV)    Coronavirus HKU1 NOT DETECTED NOT DETECTED Final   Coronavirus NL63 NOT DETECTED NOT DETECTED Final   Coronavirus OC43 NOT DETECTED NOT DETECTED Final   Metapneumovirus NOT DETECTED NOT DETECTED Final   Rhinovirus / Enterovirus NOT DETECTED NOT DETECTED Final   Influenza A NOT DETECTED NOT DETECTED Final   Influenza B NOT DETECTED NOT DETECTED Final   Parainfluenza Virus 1 NOT DETECTED NOT DETECTED Final   Parainfluenza Virus 2 NOT DETECTED NOT DETECTED Final   Parainfluenza Virus 3 NOT DETECTED NOT DETECTED Final   Parainfluenza Virus 4 NOT DETECTED NOT DETECTED Final   Respiratory Syncytial Virus NOT DETECTED NOT DETECTED Final   Bordetella pertussis  NOT DETECTED NOT DETECTED Final   Chlamydophila pneumoniae NOT DETECTED NOT DETECTED Final   Mycoplasma  pneumoniae NOT DETECTED NOT DETECTED Final    Comment: Performed at [facility], [place] [address] [place], [place] [address]  Culture, respiratory     Status: None (Preliminary result)   Collection Time: 02/11/2019  5:30 PM   Specimen: Bronchoalveolar Lavage; Respiratory  Result Value Ref Range Status   Specimen Description BRONCHIAL ALVEOLAR LAVAGE  Final   Special Requests NONE  Final   Gram Stain   Final    ABUNDANT WBC PRESENT,BOTH PMN AND MONONUCLEAR MODERATE GRAM POSITIVE COCCI MODERATE GRAM POSITIVE RODS FEW YEAST    Culture   Final    TOO YOUNG TO READ Performed at [facility], [place] [address] [place], [place] [address]    Report Status PENDING  Incomplete  Fungus Culture With Stain     Status: Abnormal (Preliminary result)   Collection Time: 02/22/2019  5:30 PM   Specimen: Bronchial Alveolar Lavage  Result Value Ref Range Status   Fungus Stain Final report (A)  Final    Comment: (NOTE) Performed At: [facility] [place], [place] [id number] Rush Farmer MD [id number]    Fungus (Mycology) Culture PENDING  Incomplete   Fungal Source BRONCHIAL ALVEOLAR LAVAGE  Final    Comment: Performed at [facility], [place] [address] [place], [place] [address]  Fungus Culture Result     Status: Abnormal   Collection Time: 02/11/2019  5:30 PM  Result Value Ref Range Status   Result 1 Comment (A)  Final    Comment: (NOTE) Fungal elements, such as arthroconidia, hyphal fragments, chlamydoconidia, observed. Performed At: [facility] [place], [place] [id number] Rush Farmer MD KG:[id number]   Blood Culture (routine x 2)     Status: None (Preliminary result)   Collection Time: 02/06/2019  7:52 PM   Specimen: BLOOD RIGHT HAND  Result Value Ref Range Status   Specimen Description BLOOD RIGHT HAND  Final   Special  Requests   Final    BOTTLES DRAWN AEROBIC ONLY Blood Culture results may not be optimal due to an inadequate volume of blood received in culture bottles   Culture   Final    NO GROWTH < 12 HOURS Performed at [facility], [address] [place], [place] [address]    Report Status PENDING  Incomplete     Resolved Hospital Problem list      Assessment & Plan:  #COVID-19 12/31/2018 - no longer contagious   # Acute metabolic encephalopathy. CT head with chronic changes   # Acute hypoxemic respiratory failure- CXR c/w ARDS, probably some DAD from previous COVID, superimposed ? HCAP  /sp terminal extubation 02/05/2019.  -Continuing comfort- based care; continue morphine for prevention of of pain control issues just let them know that you talk to me slighted tracking down air hunger.  # Previous renal allograft now failed # ESRD/Chronic renal failure, hyperkalemia- missed HD, some peaked T waves on EKG. Not a CRRT candidate -No additional renal replacement therapy   # Immunocompromised - on chronic prograf, prednisone, -No need for ongoing immunosuppressive meds  # Muscular deconditioning # Severe protein calorie malnutrition present on admission # Failure to thrive # Frail elderly   # Chronic blood loss anemia -No longer checking labs.    Comfort care Terminal Care Palliative care Appears comfortable and family feels that all of his needs are being met. D/w his bedside RN Mendel Ryder.   Plan  - continue morphine and comfort measures  I will transition him to [facility] service. Awaiting a call back.  Best practice:  Comfort care     [place], DO 2019-03-11 11:52 AM [facility]

## 2019-03-06 DEATH — deceased

## 2019-03-18 LAB — FUNGUS CULTURE WITH STAIN

## 2019-03-18 LAB — FUNGUS CULTURE RESULT

## 2019-03-18 LAB — FUNGAL ORGANISM REFLEX

## 2021-06-25 IMAGING — DX DG CHEST 2V
2 series · 2 of 2 positions shown · non-contrast
Comparison: 05/13/2013

CLINICAL DATA: Sore throat, runny nose, cough and chills.

EXAM:
CHEST - 2 VIEW

[chest pa]
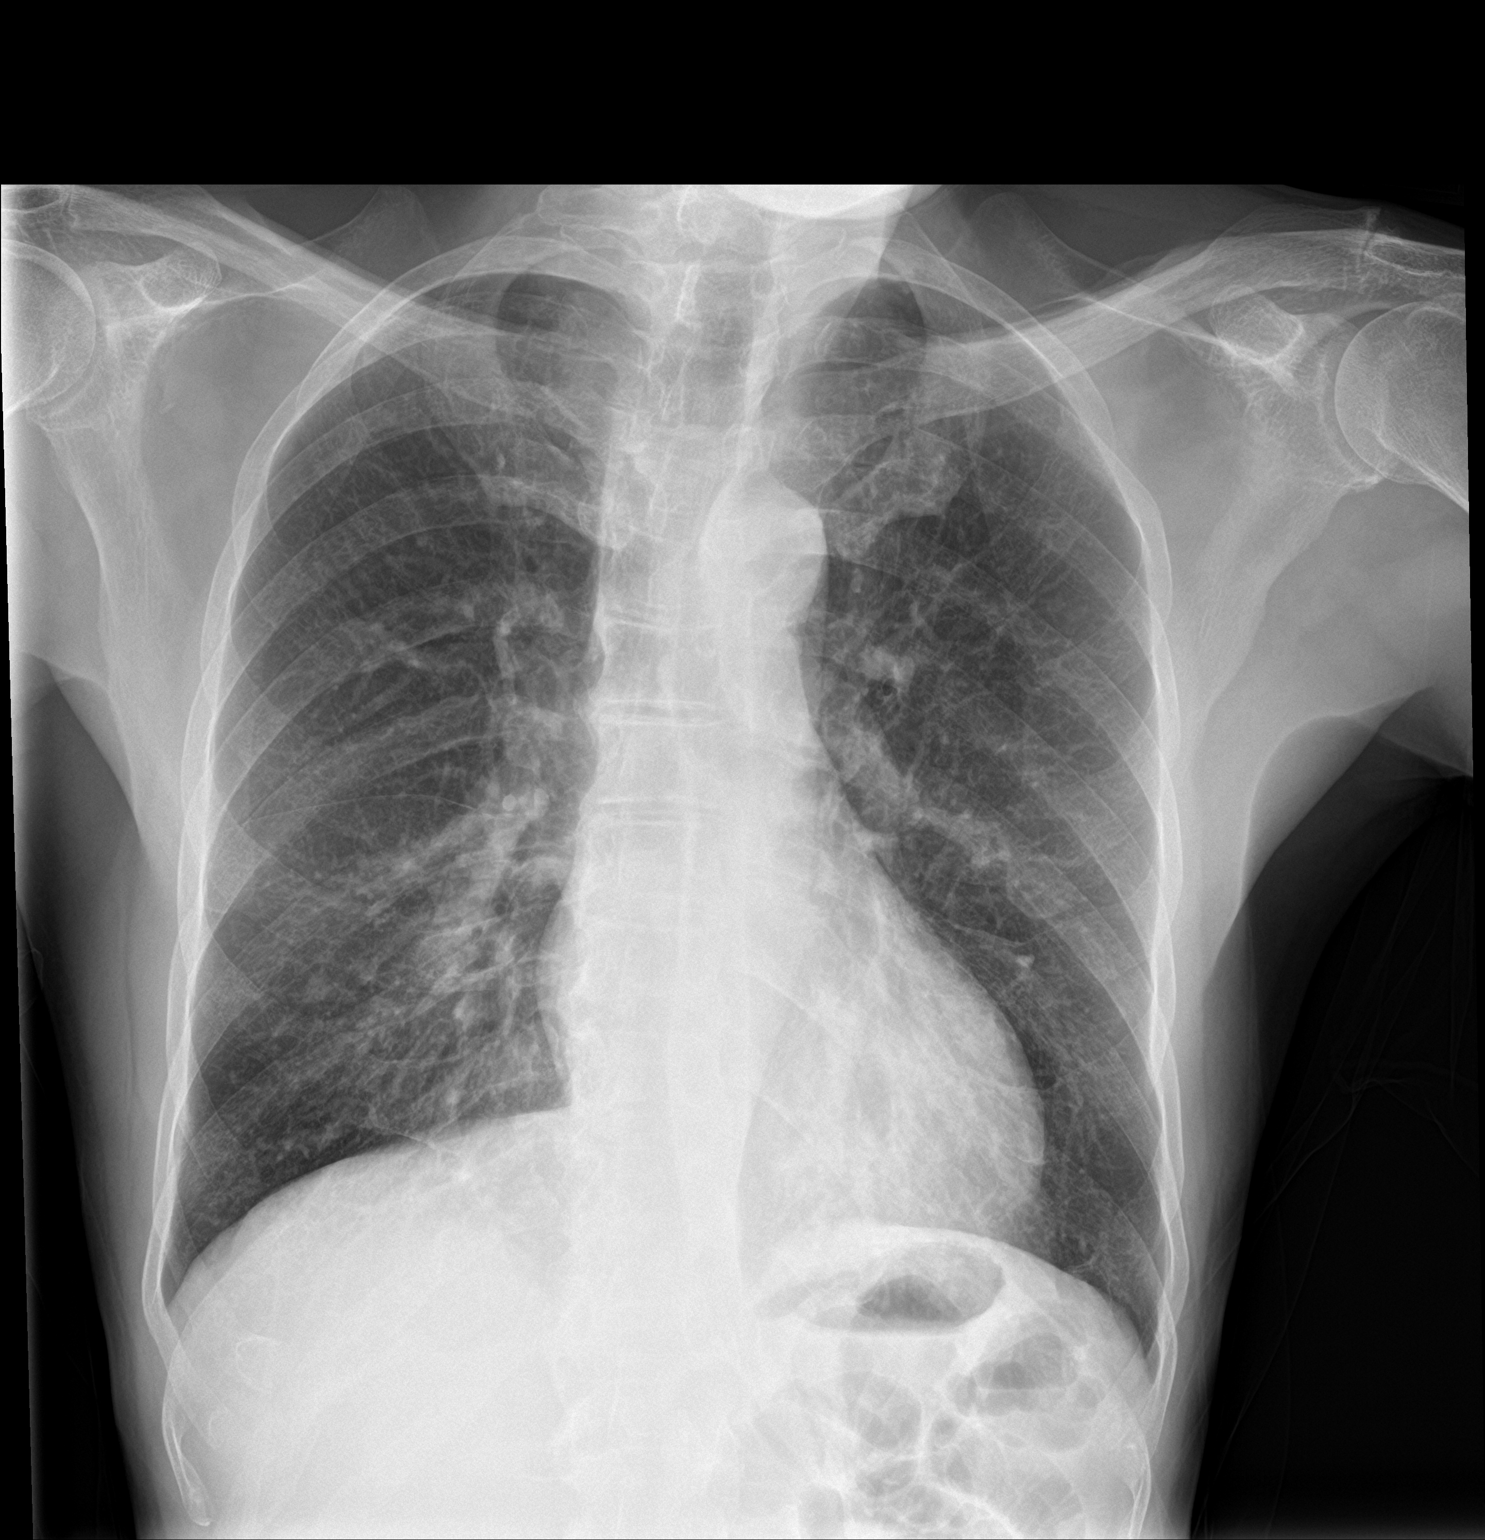

[chest lat]
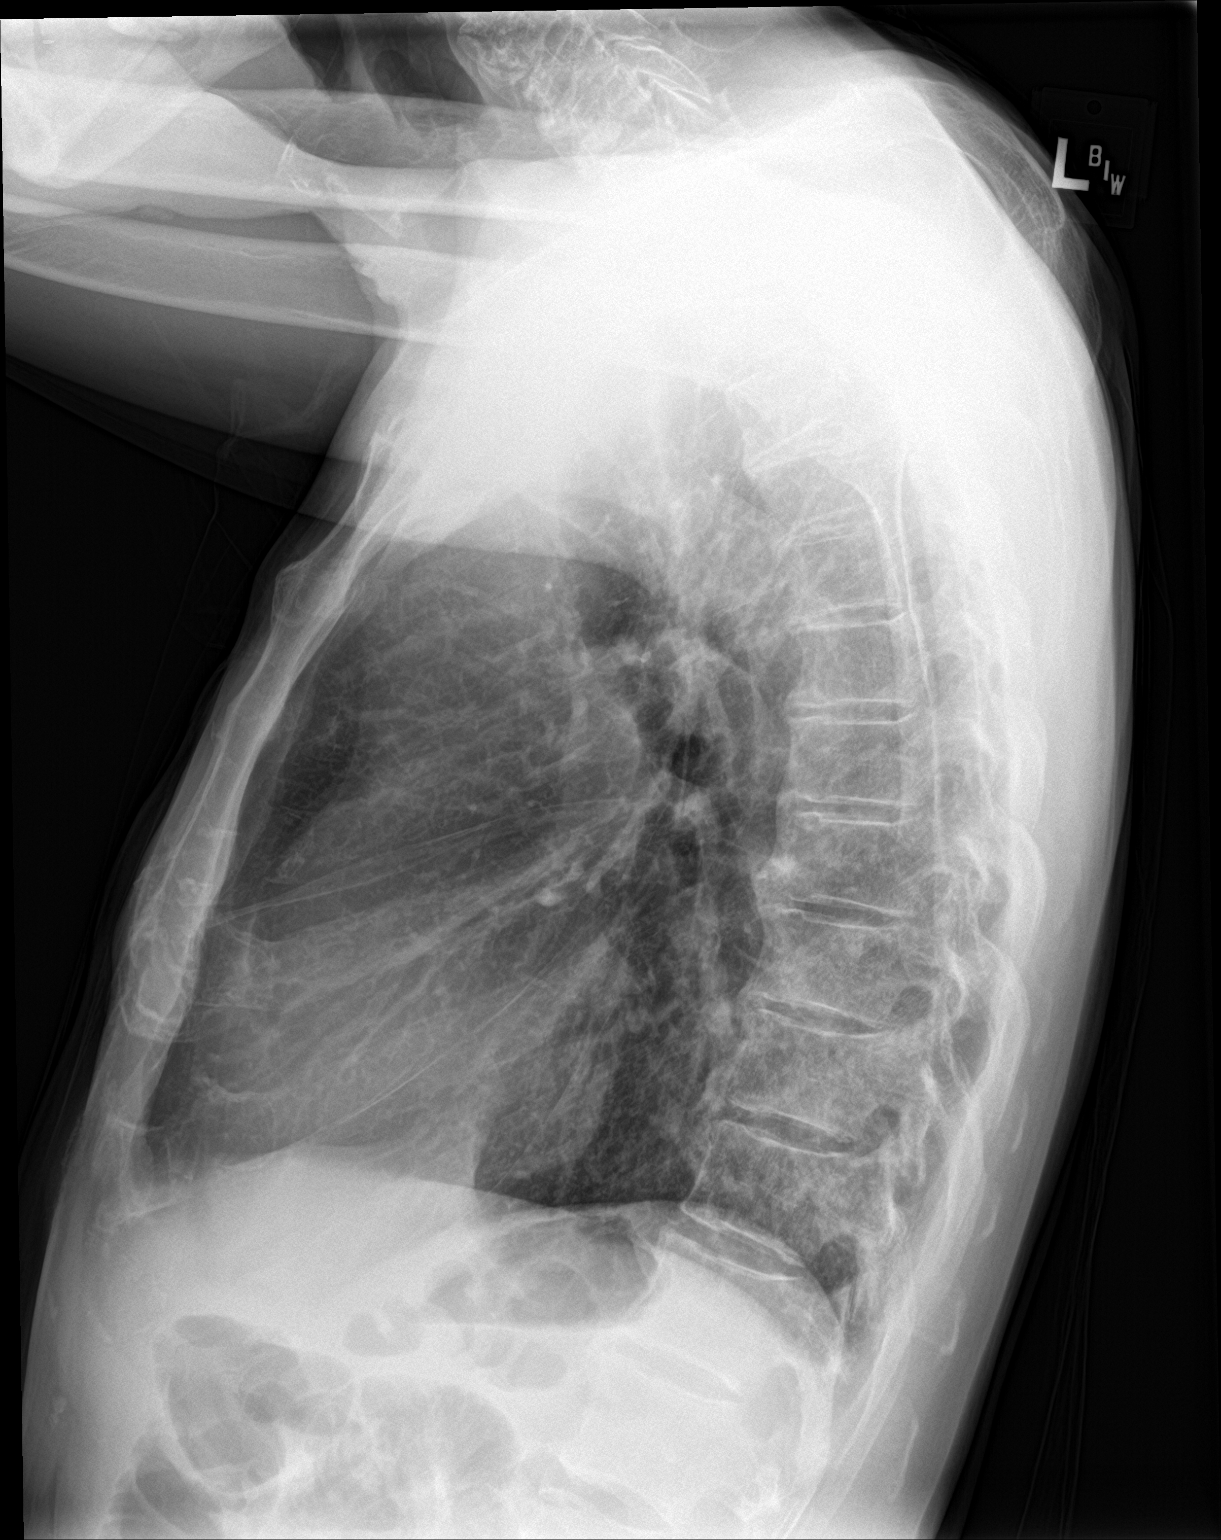

[2 of 2 positions shown; findings below may reference images not displayed]

FINDINGS: The heart size and mediastinal contours are within normal limits.
Superimposed on chronic lung disease is some increased bronchial and
interstitial prominence in the right lower lung which may be
consistent with acute bronchitis and potentially early pneumonia. No
pulmonary edema. The visualized skeletal structures are
unremarkable.
IMPRESSION: Increased bronchial and interstitial prominence in the right lower
lung which may be consistent with acute bronchitis and potentially
early pneumonia.

## 2021-07-06 IMAGING — DX DG CHEST 1V PORT
1 series · 1 of 1 positions shown · non-contrast
Comparison: Chest radiograph 12/31/2018

CLINICAL DATA: Altered mental status

EXAM:
PORTABLE CHEST 1 VIEW

[chest]
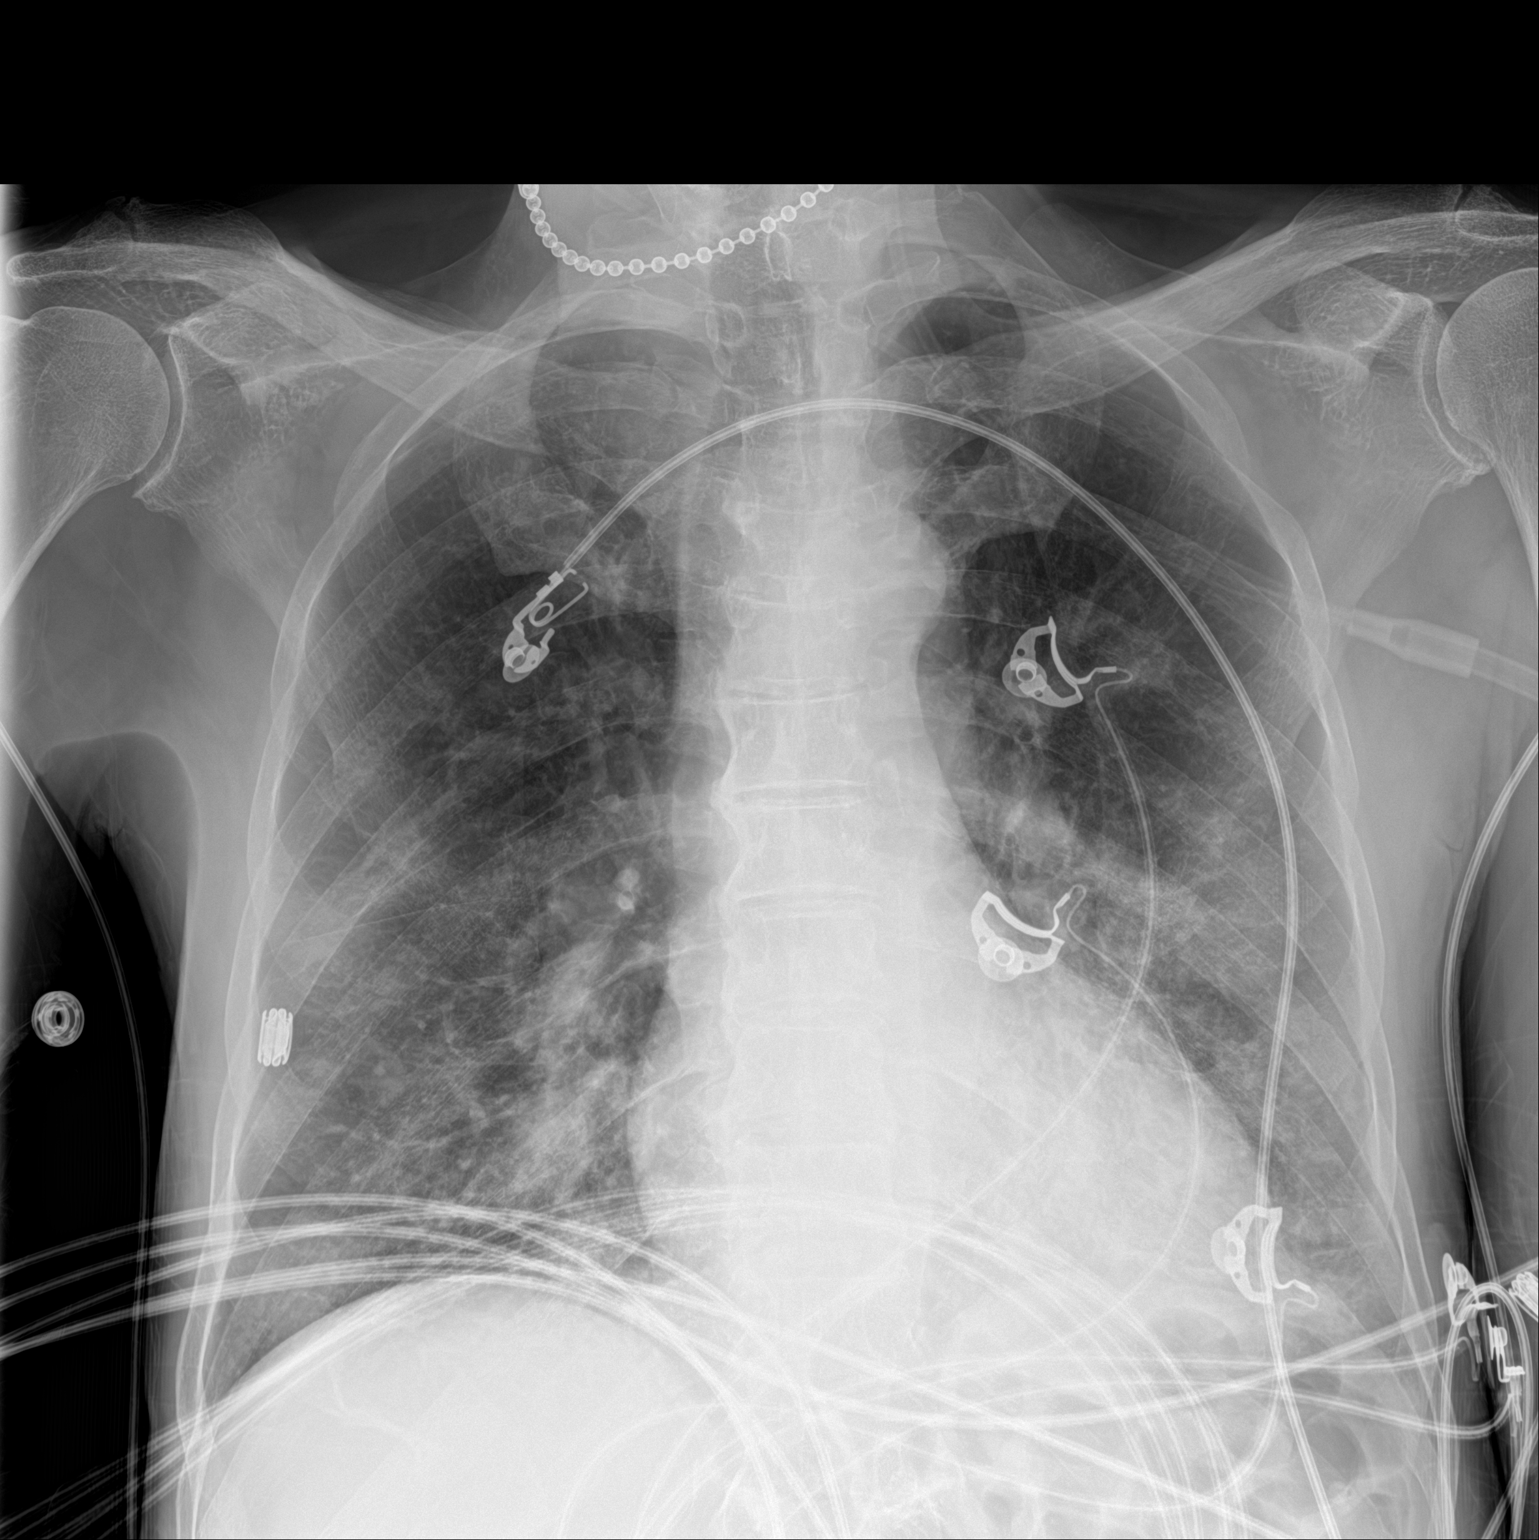

[1 of 1 positions shown; findings below may reference images not displayed]

FINDINGS: Monitoring leads overlie the patient. Stable cardiac and mediastinal
contours. Interval development of bilateral patchy consolidative
opacities. No pleural effusion or pneumothorax.
IMPRESSION: Bilateral patchy areas of consolidation may represent multifocal
pneumonia or atypical/viral infectious process.

## 2021-07-11 IMAGING — DX DG CHEST 1V PORT
1 series · 1 of 1 positions shown · non-contrast
Comparison: Radiograph same [AGE], 01/15/2019

CLINICAL DATA: Bilateral pleural effusions. COVID positive.

EXAM:
PORTABLE CHEST 1 VIEW

[chest]
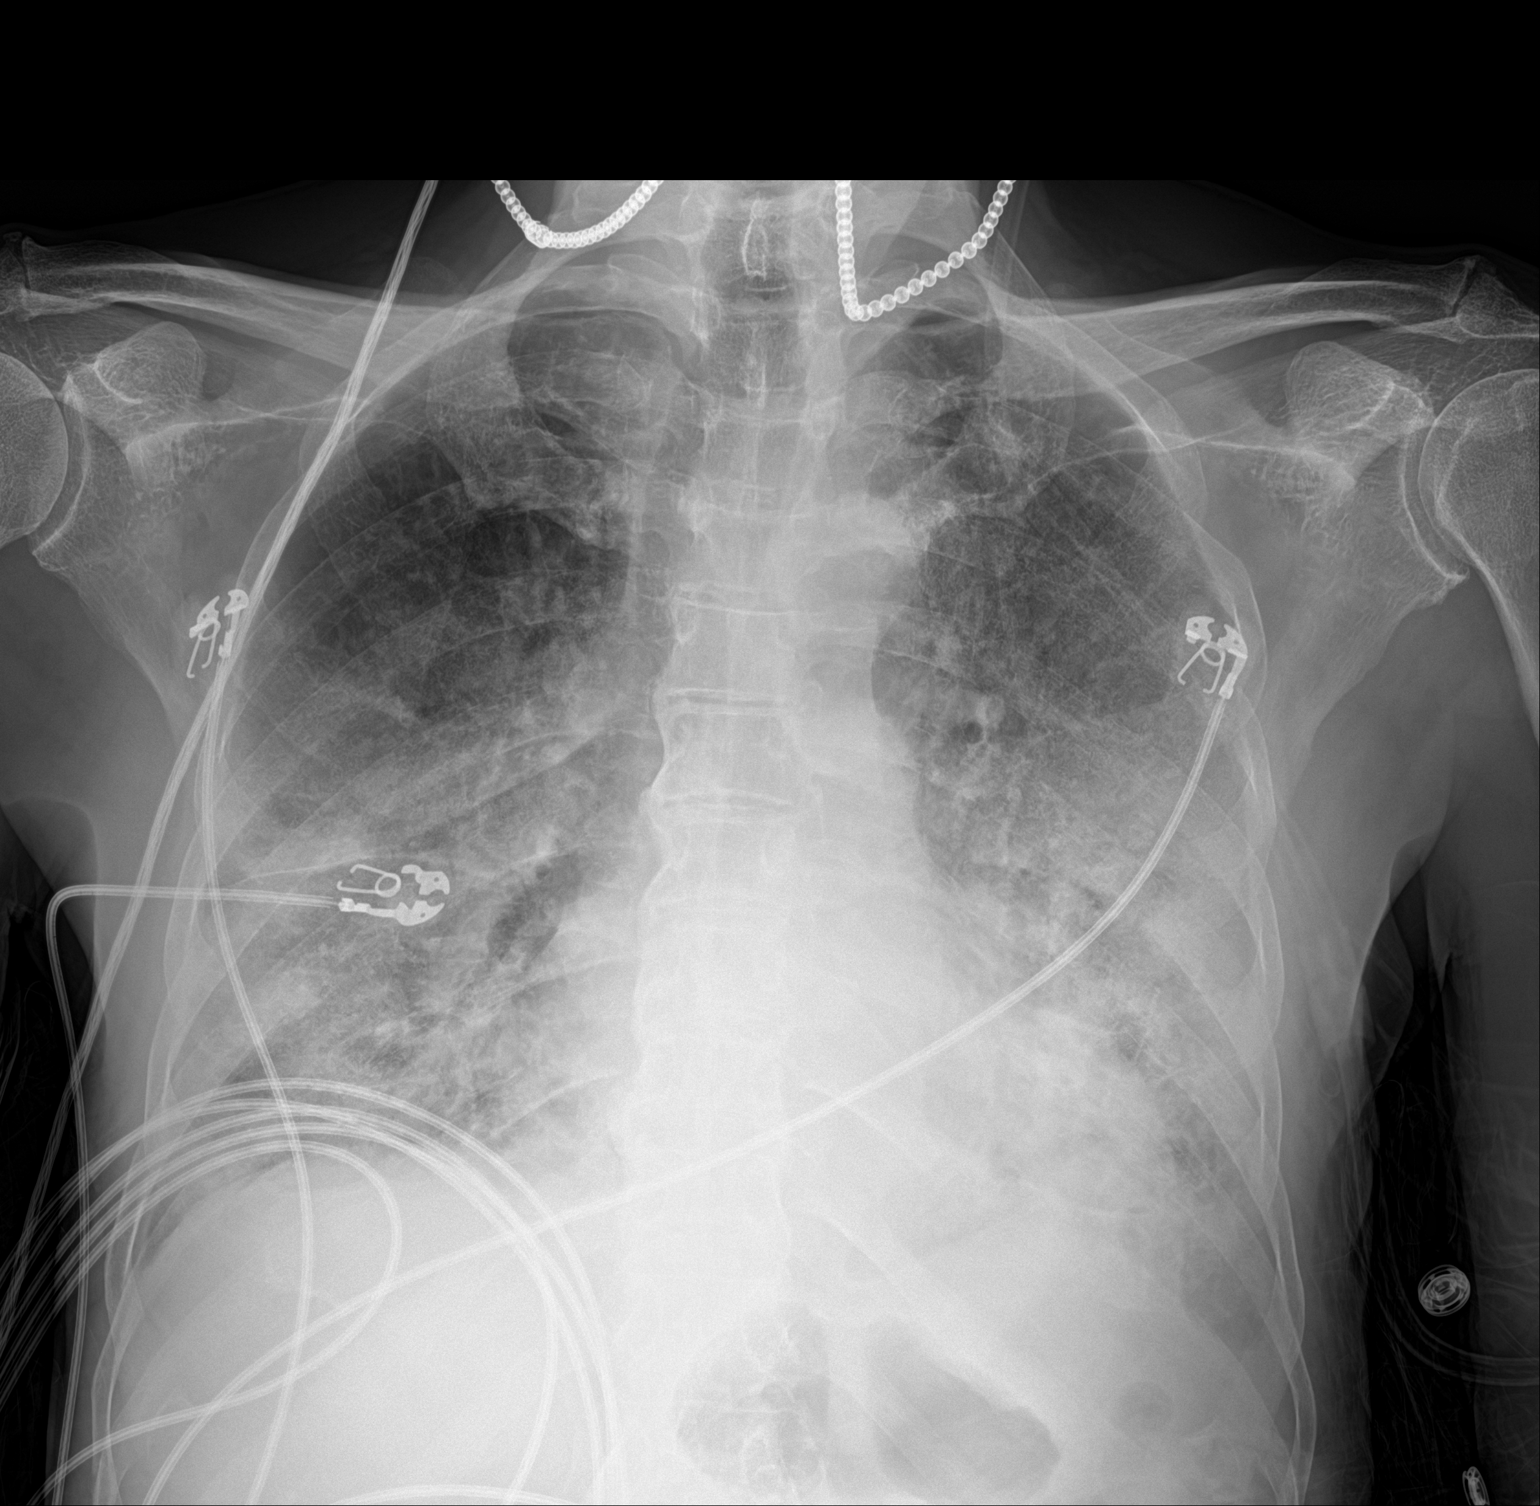

[1 of 1 positions shown; findings below may reference images not displayed]

FINDINGS: Stable mediastinum and cardiac silhouette. There is diffuse airspace
disease in the lower lobes not improved from comparison exams.
Potential small bilateral effusions. No pneumothorax. No acute
osseous abnormality.
IMPRESSION: No interval change in bilateral lower lobe airspace disease.

## 2021-07-12 IMAGING — DX DG CHEST 1V PORT
1 series · 1 of 1 positions shown · non-contrast
Comparison: 01/16/2019

CLINICAL DATA: Shortness of breath

EXAM:
PORTABLE CHEST 1 VIEW

[chest]
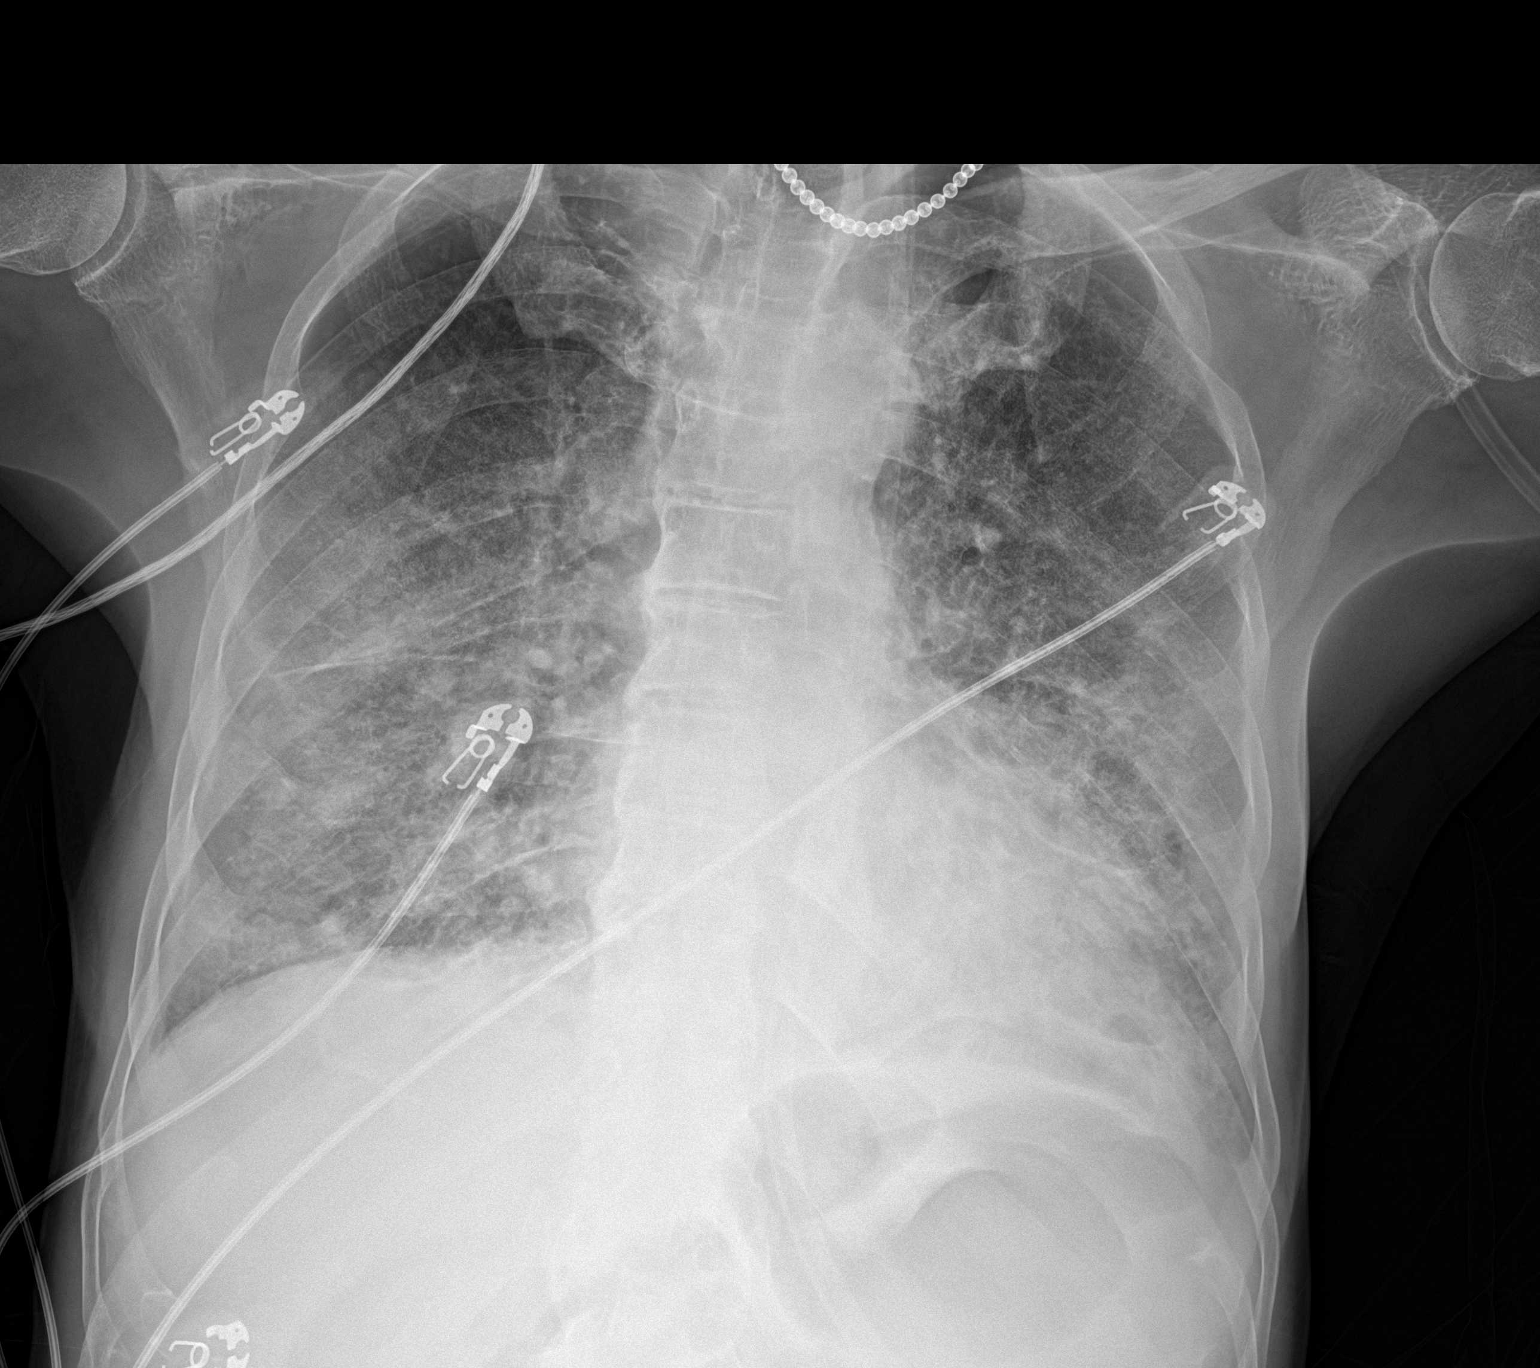

[1 of 1 positions shown; findings below may reference images not displayed]

FINDINGS: No significant interval change in AP portable examination with
diffuse interstitial and heterogeneous airspace opacity. Mild
cardiomegaly.
IMPRESSION: No significant interval change in AP portable examination with
diffuse interstitial and heterogeneous airspace opacity. Findings
remain consistent with multifocal infection and/or edema.

## 2021-07-17 IMAGING — DX DG CHEST 1V PORT
1 series · 1 of 1 positions shown · non-contrast
Comparison: Chest radiograph dated 01/17/2019.

CLINICAL DATA: 73-year-old male with hypoxia. Positive [id number].

EXAM:
PORTABLE CHEST 1 VIEW

[chest ap]
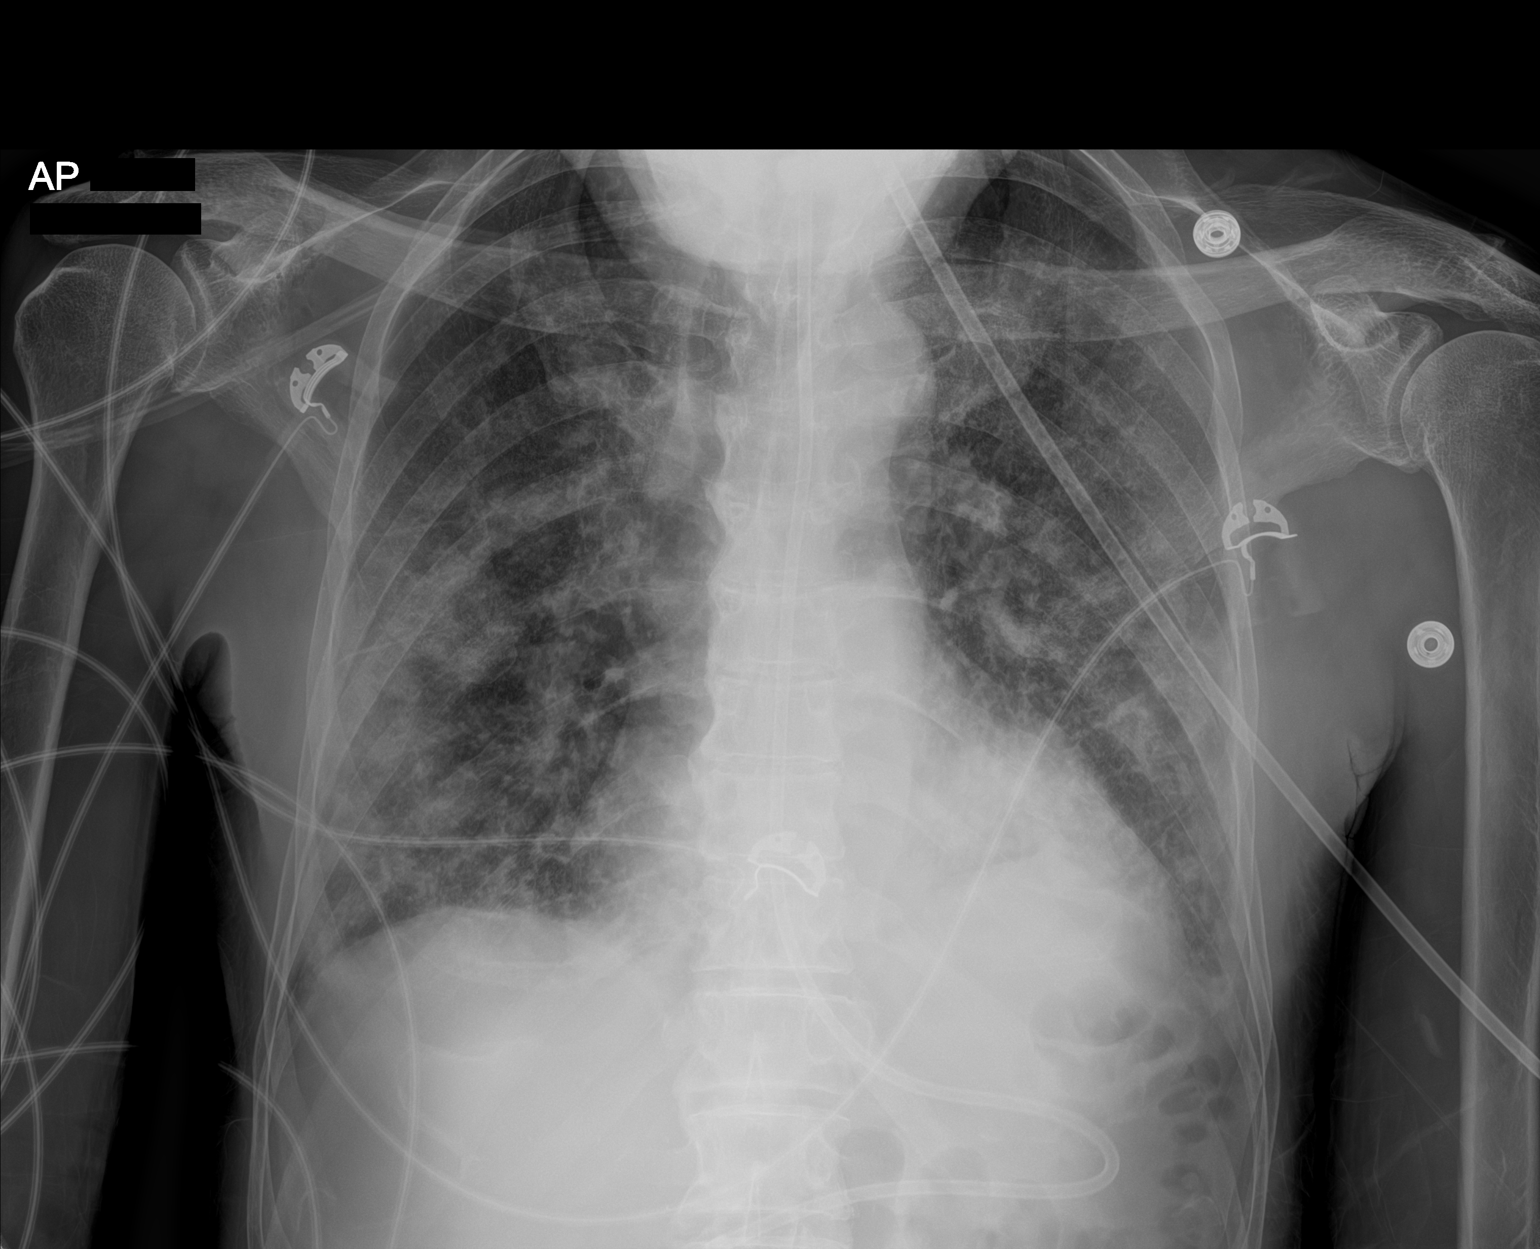

[1 of 1 positions shown; findings below may reference images not displayed]

FINDINGS: A feeding tube is noted with tip beyond the inferior margin of the
image. Bilateral confluent airspace opacities with overall slight
improvement since the prior radiograph. Probable small left pleural
effusion. No pneumothorax. Stable cardiac silhouette with
atherosclerotic calcification of the aorta. No acute osseous
pathology.
IMPRESSION: 1. Slight interval improvement in the bilateral confluent airspace
opacities compared to the prior radiograph.
2. Probable small left pleural effusion.
